# Patient Record
Sex: Female | Born: 1955 | Race: Black or African American | Hispanic: No | Marital: Single | Smoking: Current some day smoker
Health system: Southern US, Community
[De-identification: ages and names within clinical notes are randomized; demographics above are authoritative.]

## PROBLEM LIST (undated history)

## (undated) DIAGNOSIS — I517 Cardiomegaly: Secondary | ICD-10-CM

## (undated) DIAGNOSIS — K219 Gastro-esophageal reflux disease without esophagitis: Secondary | ICD-10-CM

## (undated) DIAGNOSIS — E119 Type 2 diabetes mellitus without complications: Secondary | ICD-10-CM

## (undated) DIAGNOSIS — I2510 Atherosclerotic heart disease of native coronary artery without angina pectoris: Secondary | ICD-10-CM

## (undated) DIAGNOSIS — I639 Cerebral infarction, unspecified: Secondary | ICD-10-CM

## (undated) DIAGNOSIS — G43909 Migraine, unspecified, not intractable, without status migrainosus: Secondary | ICD-10-CM

## (undated) DIAGNOSIS — I209 Angina pectoris, unspecified: Secondary | ICD-10-CM

## (undated) DIAGNOSIS — D649 Anemia, unspecified: Secondary | ICD-10-CM

## (undated) DIAGNOSIS — E7800 Pure hypercholesterolemia, unspecified: Secondary | ICD-10-CM

## (undated) DIAGNOSIS — K5900 Constipation, unspecified: Secondary | ICD-10-CM

## (undated) DIAGNOSIS — M1990 Unspecified osteoarthritis, unspecified site: Secondary | ICD-10-CM

## (undated) DIAGNOSIS — I10 Essential (primary) hypertension: Secondary | ICD-10-CM

## (undated) DIAGNOSIS — T4145XA Adverse effect of unspecified anesthetic, initial encounter: Secondary | ICD-10-CM

## (undated) DIAGNOSIS — T8859XA Other complications of anesthesia, initial encounter: Secondary | ICD-10-CM

## (undated) HISTORY — DX: Atherosclerotic heart disease of native coronary artery without angina pectoris: I25.10

## (undated) HISTORY — DX: Cerebral infarction, unspecified: I63.9

## (undated) HISTORY — PX: ABDOMINAL HYSTERECTOMY: SHX81

## (undated) HISTORY — PX: CORONARY ANGIOPLASTY WITH STENT PLACEMENT: SHX49

## (undated) HISTORY — PX: FRACTURE SURGERY: SHX138

## (undated) HISTORY — PX: MULTIPLE TOOTH EXTRACTIONS: SHX2053

---

## 1997-10-06 ENCOUNTER — Encounter: Admission: RE | Admit: 1997-10-06 | Discharge: 1997-10-06 | Payer: Self-pay | Admitting: *Deleted

## 1997-11-02 ENCOUNTER — Emergency Department (HOSPITAL_COMMUNITY): Admission: EM | Admit: 1997-11-02 | Discharge: 1997-11-02 | Payer: Self-pay

## 1997-11-02 ENCOUNTER — Inpatient Hospital Stay (HOSPITAL_COMMUNITY): Admission: AD | Admit: 1997-11-02 | Discharge: 1997-11-03 | Payer: Self-pay | Admitting: Internal Medicine

## 1997-11-03 ENCOUNTER — Encounter: Payer: Self-pay | Admitting: Internal Medicine

## 1997-11-03 ENCOUNTER — Emergency Department (HOSPITAL_COMMUNITY): Admission: EM | Admit: 1997-11-03 | Discharge: 1997-11-03 | Payer: Self-pay | Admitting: Emergency Medicine

## 1997-11-22 ENCOUNTER — Emergency Department (HOSPITAL_COMMUNITY): Admission: EM | Admit: 1997-11-22 | Discharge: 1997-11-22 | Payer: Self-pay | Admitting: Emergency Medicine

## 1997-11-22 ENCOUNTER — Ambulatory Visit (HOSPITAL_COMMUNITY): Admission: RE | Admit: 1997-11-22 | Discharge: 1997-11-22 | Payer: Self-pay | Admitting: Family Medicine

## 1997-11-22 ENCOUNTER — Encounter: Payer: Self-pay | Admitting: Family Medicine

## 1998-09-09 ENCOUNTER — Other Ambulatory Visit: Admission: RE | Admit: 1998-09-09 | Discharge: 1998-09-09 | Payer: Self-pay | Admitting: Obstetrics and Gynecology

## 1998-09-20 ENCOUNTER — Other Ambulatory Visit: Admission: RE | Admit: 1998-09-20 | Discharge: 1998-09-20 | Payer: Self-pay | Admitting: *Deleted

## 1998-09-20 ENCOUNTER — Encounter (INDEPENDENT_AMBULATORY_CARE_PROVIDER_SITE_OTHER): Payer: Self-pay

## 1998-10-27 ENCOUNTER — Encounter: Payer: Self-pay | Admitting: *Deleted

## 1998-10-27 ENCOUNTER — Ambulatory Visit (HOSPITAL_COMMUNITY): Admission: RE | Admit: 1998-10-27 | Discharge: 1998-10-27 | Payer: Self-pay | Admitting: *Deleted

## 1998-11-04 ENCOUNTER — Encounter: Payer: Self-pay | Admitting: *Deleted

## 1998-11-04 ENCOUNTER — Ambulatory Visit (HOSPITAL_COMMUNITY): Admission: RE | Admit: 1998-11-04 | Discharge: 1998-11-04 | Payer: Self-pay | Admitting: *Deleted

## 1999-01-26 ENCOUNTER — Ambulatory Visit (HOSPITAL_COMMUNITY): Admission: RE | Admit: 1999-01-26 | Discharge: 1999-01-26 | Payer: Self-pay | Admitting: Obstetrics and Gynecology

## 1999-01-26 ENCOUNTER — Encounter: Payer: Self-pay | Admitting: Obstetrics and Gynecology

## 1999-05-23 ENCOUNTER — Ambulatory Visit: Admission: RE | Admit: 1999-05-23 | Discharge: 1999-05-23 | Payer: Self-pay | Admitting: Gynecology

## 1999-05-25 ENCOUNTER — Encounter: Payer: Self-pay | Admitting: Obstetrics and Gynecology

## 1999-05-25 ENCOUNTER — Ambulatory Visit (HOSPITAL_COMMUNITY): Admission: RE | Admit: 1999-05-25 | Discharge: 1999-05-25 | Payer: Self-pay | Admitting: Obstetrics and Gynecology

## 2001-04-21 ENCOUNTER — Other Ambulatory Visit: Admission: RE | Admit: 2001-04-21 | Discharge: 2001-04-21 | Payer: Self-pay | Admitting: Internal Medicine

## 2001-04-30 ENCOUNTER — Encounter: Payer: Self-pay | Admitting: Internal Medicine

## 2001-04-30 ENCOUNTER — Encounter: Admission: RE | Admit: 2001-04-30 | Discharge: 2001-04-30 | Payer: Self-pay | Admitting: Internal Medicine

## 2001-05-01 ENCOUNTER — Ambulatory Visit (HOSPITAL_COMMUNITY): Admission: RE | Admit: 2001-05-01 | Discharge: 2001-05-01 | Payer: Self-pay | Admitting: Internal Medicine

## 2001-05-01 ENCOUNTER — Encounter: Payer: Self-pay | Admitting: Internal Medicine

## 2001-05-05 ENCOUNTER — Encounter: Admission: RE | Admit: 2001-05-05 | Discharge: 2001-05-05 | Payer: Self-pay | Admitting: Internal Medicine

## 2001-05-05 ENCOUNTER — Encounter: Payer: Self-pay | Admitting: Internal Medicine

## 2001-08-28 ENCOUNTER — Emergency Department (HOSPITAL_COMMUNITY): Admission: EM | Admit: 2001-08-28 | Discharge: 2001-08-29 | Payer: Self-pay | Admitting: Emergency Medicine

## 2001-09-02 ENCOUNTER — Emergency Department (HOSPITAL_COMMUNITY): Admission: EM | Admit: 2001-09-02 | Discharge: 2001-09-02 | Payer: Self-pay | Admitting: Emergency Medicine

## 2003-03-13 HISTORY — PX: CARDIAC CATHETERIZATION: SHX172

## 2003-03-29 ENCOUNTER — Emergency Department (HOSPITAL_COMMUNITY): Admission: EM | Admit: 2003-03-29 | Discharge: 2003-03-29 | Payer: Self-pay | Admitting: Emergency Medicine

## 2003-05-07 ENCOUNTER — Ambulatory Visit (HOSPITAL_COMMUNITY): Admission: RE | Admit: 2003-05-07 | Discharge: 2003-05-07 | Payer: Self-pay | Admitting: Internal Medicine

## 2003-06-27 ENCOUNTER — Emergency Department (HOSPITAL_COMMUNITY): Admission: EM | Admit: 2003-06-27 | Discharge: 2003-06-28 | Payer: Self-pay | Admitting: Emergency Medicine

## 2003-08-05 ENCOUNTER — Ambulatory Visit (HOSPITAL_COMMUNITY): Admission: RE | Admit: 2003-08-05 | Discharge: 2003-08-05 | Payer: Self-pay | Admitting: Cardiology

## 2004-01-17 ENCOUNTER — Ambulatory Visit: Payer: Self-pay | Admitting: Family Medicine

## 2004-01-18 ENCOUNTER — Ambulatory Visit: Payer: Self-pay | Admitting: Internal Medicine

## 2004-01-18 ENCOUNTER — Ambulatory Visit: Payer: Self-pay | Admitting: *Deleted

## 2004-02-15 ENCOUNTER — Ambulatory Visit: Payer: Self-pay | Admitting: Internal Medicine

## 2004-03-09 ENCOUNTER — Ambulatory Visit: Payer: Self-pay | Admitting: Family Medicine

## 2004-04-06 ENCOUNTER — Ambulatory Visit: Payer: Self-pay | Admitting: Family Medicine

## 2004-04-12 ENCOUNTER — Ambulatory Visit: Payer: Self-pay | Admitting: Internal Medicine

## 2004-04-18 ENCOUNTER — Ambulatory Visit: Payer: Self-pay | Admitting: Internal Medicine

## 2004-04-26 ENCOUNTER — Ambulatory Visit: Payer: Self-pay | Admitting: Internal Medicine

## 2004-05-08 ENCOUNTER — Ambulatory Visit (HOSPITAL_COMMUNITY): Admission: RE | Admit: 2004-05-08 | Discharge: 2004-05-08 | Payer: Self-pay | Admitting: Family Medicine

## 2004-06-26 ENCOUNTER — Ambulatory Visit: Payer: Self-pay | Admitting: Internal Medicine

## 2004-10-31 ENCOUNTER — Ambulatory Visit: Payer: Self-pay | Admitting: Family Medicine

## 2005-02-06 ENCOUNTER — Ambulatory Visit: Payer: Self-pay | Admitting: Internal Medicine

## 2005-02-22 ENCOUNTER — Ambulatory Visit: Payer: Self-pay | Admitting: Internal Medicine

## 2005-03-02 ENCOUNTER — Ambulatory Visit: Payer: Self-pay | Admitting: Internal Medicine

## 2005-03-23 ENCOUNTER — Ambulatory Visit: Payer: Self-pay | Admitting: Internal Medicine

## 2005-06-13 ENCOUNTER — Ambulatory Visit: Payer: Self-pay | Admitting: Family Medicine

## 2005-06-15 ENCOUNTER — Ambulatory Visit: Payer: Self-pay | Admitting: Family Medicine

## 2005-06-28 ENCOUNTER — Ambulatory Visit (HOSPITAL_COMMUNITY): Admission: RE | Admit: 2005-06-28 | Discharge: 2005-06-28 | Payer: Self-pay | Admitting: Internal Medicine

## 2005-07-11 ENCOUNTER — Encounter: Payer: Self-pay | Admitting: Internal Medicine

## 2005-08-14 ENCOUNTER — Ambulatory Visit: Payer: Self-pay | Admitting: Family Medicine

## 2005-11-01 ENCOUNTER — Ambulatory Visit: Payer: Self-pay | Admitting: Internal Medicine

## 2005-11-01 ENCOUNTER — Encounter (INDEPENDENT_AMBULATORY_CARE_PROVIDER_SITE_OTHER): Payer: Self-pay | Admitting: Internal Medicine

## 2006-04-08 ENCOUNTER — Ambulatory Visit: Payer: Self-pay | Admitting: Family Medicine

## 2006-04-08 ENCOUNTER — Encounter (INDEPENDENT_AMBULATORY_CARE_PROVIDER_SITE_OTHER): Payer: Self-pay | Admitting: Internal Medicine

## 2006-04-08 LAB — CONVERTED CEMR LAB
Cholesterol: 221 mg/dL
LDL Cholesterol: 137 mg/dL

## 2006-06-17 ENCOUNTER — Ambulatory Visit: Payer: Self-pay | Admitting: Internal Medicine

## 2006-06-19 ENCOUNTER — Ambulatory Visit: Payer: Self-pay | Admitting: Internal Medicine

## 2006-07-02 ENCOUNTER — Ambulatory Visit (HOSPITAL_COMMUNITY): Admission: RE | Admit: 2006-07-02 | Discharge: 2006-07-02 | Payer: Self-pay | Admitting: Internal Medicine

## 2006-07-11 ENCOUNTER — Ambulatory Visit: Payer: Self-pay | Admitting: Internal Medicine

## 2006-07-22 ENCOUNTER — Encounter (INDEPENDENT_AMBULATORY_CARE_PROVIDER_SITE_OTHER): Payer: Self-pay | Admitting: Internal Medicine

## 2006-07-22 ENCOUNTER — Ambulatory Visit: Payer: Self-pay | Admitting: Internal Medicine

## 2006-07-22 LAB — CONVERTED CEMR LAB: Hgb A1c MFr Bld: 8.1 %

## 2006-07-23 ENCOUNTER — Ambulatory Visit (HOSPITAL_COMMUNITY): Admission: RE | Admit: 2006-07-23 | Discharge: 2006-07-23 | Payer: Self-pay | Admitting: Internal Medicine

## 2006-11-27 ENCOUNTER — Encounter (INDEPENDENT_AMBULATORY_CARE_PROVIDER_SITE_OTHER): Payer: Self-pay | Admitting: *Deleted

## 2006-12-10 ENCOUNTER — Encounter (INDEPENDENT_AMBULATORY_CARE_PROVIDER_SITE_OTHER): Payer: Self-pay | Admitting: Internal Medicine

## 2006-12-10 DIAGNOSIS — M25559 Pain in unspecified hip: Secondary | ICD-10-CM

## 2006-12-10 DIAGNOSIS — J3089 Other allergic rhinitis: Secondary | ICD-10-CM

## 2006-12-10 DIAGNOSIS — E785 Hyperlipidemia, unspecified: Secondary | ICD-10-CM

## 2006-12-10 DIAGNOSIS — E118 Type 2 diabetes mellitus with unspecified complications: Secondary | ICD-10-CM

## 2006-12-10 DIAGNOSIS — I10 Essential (primary) hypertension: Secondary | ICD-10-CM | POA: Insufficient documentation

## 2006-12-10 DIAGNOSIS — IMO0002 Reserved for concepts with insufficient information to code with codable children: Secondary | ICD-10-CM | POA: Insufficient documentation

## 2006-12-10 DIAGNOSIS — E1165 Type 2 diabetes mellitus with hyperglycemia: Secondary | ICD-10-CM

## 2006-12-17 ENCOUNTER — Telehealth (INDEPENDENT_AMBULATORY_CARE_PROVIDER_SITE_OTHER): Payer: Self-pay | Admitting: Internal Medicine

## 2007-01-09 ENCOUNTER — Telehealth (INDEPENDENT_AMBULATORY_CARE_PROVIDER_SITE_OTHER): Payer: Self-pay | Admitting: Internal Medicine

## 2007-01-13 ENCOUNTER — Encounter (INDEPENDENT_AMBULATORY_CARE_PROVIDER_SITE_OTHER): Payer: Self-pay | Admitting: Internal Medicine

## 2007-01-19 ENCOUNTER — Telehealth (INDEPENDENT_AMBULATORY_CARE_PROVIDER_SITE_OTHER): Payer: Self-pay | Admitting: Internal Medicine

## 2007-01-19 DIAGNOSIS — K089 Disorder of teeth and supporting structures, unspecified: Secondary | ICD-10-CM | POA: Insufficient documentation

## 2007-01-23 ENCOUNTER — Encounter (INDEPENDENT_AMBULATORY_CARE_PROVIDER_SITE_OTHER): Payer: Self-pay | Admitting: Internal Medicine

## 2007-01-28 ENCOUNTER — Ambulatory Visit: Payer: Self-pay | Admitting: Internal Medicine

## 2007-01-28 DIAGNOSIS — D485 Neoplasm of uncertain behavior of skin: Secondary | ICD-10-CM

## 2007-01-28 DIAGNOSIS — M6740 Ganglion, unspecified site: Secondary | ICD-10-CM

## 2007-01-28 LAB — CONVERTED CEMR LAB: Blood Glucose, Fingerstick: 126

## 2007-01-29 ENCOUNTER — Encounter (INDEPENDENT_AMBULATORY_CARE_PROVIDER_SITE_OTHER): Payer: Self-pay | Admitting: Internal Medicine

## 2007-02-11 ENCOUNTER — Ambulatory Visit: Payer: Self-pay | Admitting: Internal Medicine

## 2007-02-25 ENCOUNTER — Ambulatory Visit: Payer: Self-pay | Admitting: Internal Medicine

## 2007-03-05 ENCOUNTER — Ambulatory Visit: Payer: Self-pay | Admitting: Family Medicine

## 2007-03-05 DIAGNOSIS — J069 Acute upper respiratory infection, unspecified: Secondary | ICD-10-CM | POA: Insufficient documentation

## 2007-03-05 LAB — CONVERTED CEMR LAB: Blood Glucose, Fingerstick: 202

## 2007-03-26 LAB — CONVERTED CEMR LAB
AST: 21 units/L (ref 0–37)
Albumin: 4.5 g/dL (ref 3.5–5.2)
BUN: 15 mg/dL (ref 6–23)
Cholesterol: 168 mg/dL (ref 0–200)
Creatinine, Ser: 0.78 mg/dL (ref 0.40–1.20)
HDL: 34 mg/dL — ABNORMAL LOW (ref >39)
Total CHOL/HDL Ratio: 4.9
Total Protein: 7.9 g/dL (ref 6.0–8.3)
Triglycerides: 228 mg/dL — ABNORMAL HIGH (ref <150)
VLDL: 46 mg/dL — ABNORMAL HIGH (ref 0–40)

## 2007-05-06 ENCOUNTER — Ambulatory Visit: Payer: Self-pay | Admitting: Internal Medicine

## 2007-05-06 LAB — CONVERTED CEMR LAB: Blood Glucose, Fingerstick: 125

## 2007-05-13 ENCOUNTER — Encounter (INDEPENDENT_AMBULATORY_CARE_PROVIDER_SITE_OTHER): Payer: Self-pay | Admitting: Internal Medicine

## 2007-06-17 ENCOUNTER — Ambulatory Visit: Payer: Self-pay | Admitting: Internal Medicine

## 2007-06-17 LAB — CONVERTED CEMR LAB
AST: 36 units/L (ref 0–37)
Albumin: 4.5 g/dL (ref 3.5–5.2)
Cholesterol: 252 mg/dL — ABNORMAL HIGH (ref 0–200)
HDL: 39 mg/dL — ABNORMAL LOW (ref >39)
Total Protein: 7.8 g/dL (ref 6.0–8.3)
Triglycerides: 479 mg/dL — ABNORMAL HIGH (ref <150)

## 2007-06-19 ENCOUNTER — Ambulatory Visit: Payer: Self-pay | Admitting: Internal Medicine

## 2007-08-19 ENCOUNTER — Ambulatory Visit (HOSPITAL_COMMUNITY): Admission: RE | Admit: 2007-08-19 | Discharge: 2007-08-19 | Payer: Self-pay | Admitting: Internal Medicine

## 2007-08-19 ENCOUNTER — Ambulatory Visit: Payer: Self-pay | Admitting: Internal Medicine

## 2007-08-19 DIAGNOSIS — M51379 Other intervertebral disc degeneration, lumbosacral region without mention of lumbar back pain or lower extremity pain: Secondary | ICD-10-CM | POA: Insufficient documentation

## 2007-08-19 DIAGNOSIS — F17210 Nicotine dependence, cigarettes, uncomplicated: Secondary | ICD-10-CM | POA: Insufficient documentation

## 2007-08-19 DIAGNOSIS — M545 Low back pain: Secondary | ICD-10-CM

## 2007-08-19 DIAGNOSIS — M5137 Other intervertebral disc degeneration, lumbosacral region: Secondary | ICD-10-CM | POA: Insufficient documentation

## 2007-08-19 LAB — CONVERTED CEMR LAB
ALT: 38 units/L — ABNORMAL HIGH (ref 0–35)
Alkaline Phosphatase: 85 units/L (ref 39–117)
Blood Glucose, Fingerstick: 152
CO2: 26 meq/L (ref 19–32)
Calcium: 10 mg/dL (ref 8.4–10.5)
Chloride: 101 meq/L (ref 96–112)
Eosinophils Absolute: 0.2 10*3/uL (ref 0.0–0.7)
Glucose, Bld: 97 mg/dL (ref 70–99)
Hemoglobin: 12.5 g/dL (ref 12.0–15.0)
Hgb A1c MFr Bld: 8.5 %
Ketones, urine, test strip: NEGATIVE
LDL Cholesterol: 86 mg/dL (ref 0–99)
Lymphocytes Relative: 29 % (ref 12–46)
MCHC: 30.8 g/dL (ref 30.0–36.0)
MCV: 91 fL (ref 78.0–100.0)
Microalb, Ur: 1.09 mg/dL (ref 0.00–1.89)
Monocytes Relative: 8 % (ref 3–12)
Neutro Abs: 4.2 10*3/uL (ref 1.7–7.7)
Nitrite: NEGATIVE
Platelets: 402 10*3/uL — ABNORMAL HIGH (ref 150–400)
Potassium: 4 meq/L (ref 3.5–5.3)
RDW: 14.4 % (ref 11.5–15.5)
Total Bilirubin: 0.4 mg/dL (ref 0.3–1.2)
Triglycerides: 282 mg/dL — ABNORMAL HIGH (ref <150)
VLDL: 56 mg/dL — ABNORMAL HIGH (ref 0–40)
WBC: 7 10*3/uL (ref 4.0–10.5)
pH: 5

## 2007-08-26 ENCOUNTER — Ambulatory Visit: Payer: Self-pay | Admitting: Internal Medicine

## 2007-08-26 ENCOUNTER — Ambulatory Visit (HOSPITAL_COMMUNITY): Admission: RE | Admit: 2007-08-26 | Discharge: 2007-08-26 | Payer: Self-pay | Admitting: Internal Medicine

## 2007-09-23 LAB — CONVERTED CEMR LAB
Rhuematoid fact SerPl-aCnc: <20 intl units/mL (ref 0–20)
Sed Rate: 22 mm/hr (ref 0–22)

## 2007-12-18 ENCOUNTER — Ambulatory Visit: Payer: Self-pay | Admitting: Nurse Practitioner

## 2007-12-18 DIAGNOSIS — J42 Unspecified chronic bronchitis: Secondary | ICD-10-CM | POA: Insufficient documentation

## 2007-12-18 LAB — CONVERTED CEMR LAB: Blood Glucose, Fingerstick: 330

## 2008-01-19 ENCOUNTER — Emergency Department (HOSPITAL_COMMUNITY): Admission: EM | Admit: 2008-01-19 | Discharge: 2008-01-19 | Payer: Self-pay | Admitting: Family Medicine

## 2008-02-17 ENCOUNTER — Encounter (INDEPENDENT_AMBULATORY_CARE_PROVIDER_SITE_OTHER): Payer: Self-pay | Admitting: Internal Medicine

## 2008-02-17 ENCOUNTER — Ambulatory Visit: Payer: Self-pay | Admitting: Internal Medicine

## 2008-03-24 ENCOUNTER — Ambulatory Visit: Payer: Self-pay | Admitting: Nurse Practitioner

## 2008-03-24 LAB — CONVERTED CEMR LAB
Blood Glucose, Fingerstick: 142
Hgb A1c MFr Bld: 8 %
KOH Prep: NEGATIVE
Specific Gravity, Urine: >=1.03
WBC Urine, dipstick: NEGATIVE
pH: 5

## 2008-04-22 ENCOUNTER — Ambulatory Visit: Payer: Self-pay | Admitting: Internal Medicine

## 2008-04-29 ENCOUNTER — Ambulatory Visit: Payer: Self-pay | Admitting: Internal Medicine

## 2008-04-29 ENCOUNTER — Encounter (INDEPENDENT_AMBULATORY_CARE_PROVIDER_SITE_OTHER): Payer: Self-pay | Admitting: Internal Medicine

## 2008-05-05 ENCOUNTER — Encounter (INDEPENDENT_AMBULATORY_CARE_PROVIDER_SITE_OTHER): Payer: Self-pay | Admitting: Internal Medicine

## 2008-05-05 ENCOUNTER — Encounter: Admission: RE | Admit: 2008-05-05 | Discharge: 2008-06-02 | Payer: Self-pay | Admitting: Internal Medicine

## 2008-05-15 DIAGNOSIS — Z780 Asymptomatic menopausal state: Secondary | ICD-10-CM | POA: Insufficient documentation

## 2008-05-18 ENCOUNTER — Encounter (INDEPENDENT_AMBULATORY_CARE_PROVIDER_SITE_OTHER): Payer: Self-pay | Admitting: Internal Medicine

## 2008-05-20 ENCOUNTER — Ambulatory Visit (HOSPITAL_COMMUNITY): Admission: RE | Admit: 2008-05-20 | Discharge: 2008-05-20 | Payer: Self-pay | Admitting: Internal Medicine

## 2008-05-20 ENCOUNTER — Encounter (INDEPENDENT_AMBULATORY_CARE_PROVIDER_SITE_OTHER): Payer: Self-pay | Admitting: Internal Medicine

## 2008-05-31 ENCOUNTER — Emergency Department (HOSPITAL_COMMUNITY): Admission: EM | Admit: 2008-05-31 | Discharge: 2008-05-31 | Payer: Self-pay | Admitting: Emergency Medicine

## 2008-06-01 ENCOUNTER — Ambulatory Visit: Payer: Self-pay | Admitting: Internal Medicine

## 2008-06-01 DIAGNOSIS — K5900 Constipation, unspecified: Secondary | ICD-10-CM | POA: Insufficient documentation

## 2008-06-14 ENCOUNTER — Encounter (INDEPENDENT_AMBULATORY_CARE_PROVIDER_SITE_OTHER): Payer: Self-pay | Admitting: Internal Medicine

## 2008-08-11 ENCOUNTER — Ambulatory Visit: Payer: Self-pay | Admitting: Internal Medicine

## 2008-08-11 LAB — CONVERTED CEMR LAB
Albumin: 4.3 g/dL (ref 3.5–5.2)
Cholesterol: 152 mg/dL (ref 0–200)
Glucose, Bld: 130 mg/dL — ABNORMAL HIGH (ref 70–99)
HDL: 32 mg/dL — ABNORMAL LOW (ref >39)
LDL Cholesterol: 87 mg/dL (ref 0–99)
Potassium: 4.7 meq/L (ref 3.5–5.3)
Sodium: 145 meq/L (ref 135–145)
Total Protein: 7.7 g/dL (ref 6.0–8.3)
Triglycerides: 165 mg/dL — ABNORMAL HIGH (ref <150)

## 2008-08-12 ENCOUNTER — Encounter (INDEPENDENT_AMBULATORY_CARE_PROVIDER_SITE_OTHER): Payer: Self-pay | Admitting: Internal Medicine

## 2008-08-13 ENCOUNTER — Encounter (INDEPENDENT_AMBULATORY_CARE_PROVIDER_SITE_OTHER): Payer: Self-pay | Admitting: Internal Medicine

## 2008-08-30 ENCOUNTER — Encounter: Admission: RE | Admit: 2008-08-30 | Discharge: 2008-08-30 | Payer: Self-pay | Admitting: Internal Medicine

## 2008-09-02 ENCOUNTER — Ambulatory Visit: Payer: Self-pay | Admitting: Internal Medicine

## 2008-09-02 LAB — CONVERTED CEMR LAB: Blood Glucose, Fingerstick: 231

## 2008-09-07 ENCOUNTER — Encounter (INDEPENDENT_AMBULATORY_CARE_PROVIDER_SITE_OTHER): Payer: Self-pay | Admitting: Internal Medicine

## 2008-09-28 ENCOUNTER — Ambulatory Visit: Payer: Self-pay | Admitting: Internal Medicine

## 2008-12-23 ENCOUNTER — Ambulatory Visit: Payer: Self-pay | Admitting: Internal Medicine

## 2008-12-23 LAB — CONVERTED CEMR LAB
ALT: 37 U/L — ABNORMAL HIGH
AST: 23 U/L
Albumin: 4.3 g/dL
Alkaline Phosphatase: 88 U/L
BUN: 17 mg/dL
Basophils Absolute: 0 K/uL
Basophils Relative: 0 %
CO2: 25 meq/L
Calcium: 10.1 mg/dL
Chloride: 102 meq/L
Cholesterol: 153 mg/dL
Creatinine, Ser: 0.69 mg/dL
Eosinophils Absolute: 0.2 K/uL
Eosinophils Relative: 3 %
Glucose, Bld: 147 mg/dL — ABNORMAL HIGH
HCT: 37.4 %
HDL: 33 mg/dL — ABNORMAL LOW
Hemoglobin: 11.5 g/dL — ABNORMAL LOW
LDL Cholesterol: 80 mg/dL
Lymphocytes Relative: 26 %
Lymphs Abs: 2.1 K/uL
MCHC: 30.7 g/dL
MCV: 90.3 fL
Monocytes Absolute: 0.8 K/uL
Monocytes Relative: 10 %
Neutro Abs: 5 K/uL
Neutrophils Relative %: 61 %
Platelets: 380 K/uL
Potassium: 4.4 meq/L
RBC: 4.14 M/uL
RDW: 15.5 %
Sodium: 141 meq/L
Total Bilirubin: 0.2 mg/dL — ABNORMAL LOW
Total CHOL/HDL Ratio: 4.6
Total Protein: 7.6 g/dL
Triglycerides: 198 mg/dL — ABNORMAL HIGH
VLDL: 40 mg/dL
WBC: 8.2 10*3/microliter

## 2008-12-25 ENCOUNTER — Emergency Department (HOSPITAL_COMMUNITY): Admission: EM | Admit: 2008-12-25 | Discharge: 2008-12-25 | Payer: Self-pay | Admitting: Family Medicine

## 2008-12-28 ENCOUNTER — Ambulatory Visit: Payer: Self-pay | Admitting: Internal Medicine

## 2008-12-28 DIAGNOSIS — D649 Anemia, unspecified: Secondary | ICD-10-CM

## 2008-12-28 DIAGNOSIS — R011 Cardiac murmur, unspecified: Secondary | ICD-10-CM | POA: Insufficient documentation

## 2008-12-28 DIAGNOSIS — R748 Abnormal levels of other serum enzymes: Secondary | ICD-10-CM | POA: Insufficient documentation

## 2008-12-28 DIAGNOSIS — S139XXA Sprain of joints and ligaments of unspecified parts of neck, initial encounter: Secondary | ICD-10-CM

## 2008-12-28 LAB — CONVERTED CEMR LAB: Blood Glucose, Fingerstick: 138

## 2008-12-29 ENCOUNTER — Encounter (INDEPENDENT_AMBULATORY_CARE_PROVIDER_SITE_OTHER): Payer: Self-pay | Admitting: Internal Medicine

## 2008-12-29 LAB — CONVERTED CEMR LAB
HCV Ab: NEGATIVE
Hep B S Ab: NEGATIVE
Iron: 52 ug/dL (ref 42–145)
Saturation Ratios: 12 % — ABNORMAL LOW (ref 20–55)
TIBC: 450 ug/dL (ref 250–470)
UIBC: 398 ug/dL

## 2009-01-10 ENCOUNTER — Telehealth (INDEPENDENT_AMBULATORY_CARE_PROVIDER_SITE_OTHER): Payer: Self-pay | Admitting: Internal Medicine

## 2009-01-26 ENCOUNTER — Telehealth (INDEPENDENT_AMBULATORY_CARE_PROVIDER_SITE_OTHER): Payer: Self-pay | Admitting: Internal Medicine

## 2009-02-01 ENCOUNTER — Ambulatory Visit: Admission: RE | Admit: 2009-02-01 | Discharge: 2009-02-01 | Payer: Self-pay | Admitting: Internal Medicine

## 2009-02-01 ENCOUNTER — Encounter (INDEPENDENT_AMBULATORY_CARE_PROVIDER_SITE_OTHER): Payer: Self-pay | Admitting: Internal Medicine

## 2009-02-16 ENCOUNTER — Encounter (INDEPENDENT_AMBULATORY_CARE_PROVIDER_SITE_OTHER): Payer: Self-pay | Admitting: *Deleted

## 2009-03-21 ENCOUNTER — Encounter (INDEPENDENT_AMBULATORY_CARE_PROVIDER_SITE_OTHER): Payer: Self-pay | Admitting: Internal Medicine

## 2009-03-21 DIAGNOSIS — I517 Cardiomegaly: Secondary | ICD-10-CM

## 2009-03-28 ENCOUNTER — Telehealth (INDEPENDENT_AMBULATORY_CARE_PROVIDER_SITE_OTHER): Payer: Self-pay | Admitting: Internal Medicine

## 2009-03-29 ENCOUNTER — Telehealth (INDEPENDENT_AMBULATORY_CARE_PROVIDER_SITE_OTHER): Payer: Self-pay | Admitting: Internal Medicine

## 2009-04-08 ENCOUNTER — Emergency Department (HOSPITAL_COMMUNITY): Admission: EM | Admit: 2009-04-08 | Discharge: 2009-04-08 | Payer: Self-pay | Admitting: Family Medicine

## 2009-08-15 ENCOUNTER — Emergency Department (HOSPITAL_COMMUNITY): Admission: EM | Admit: 2009-08-15 | Discharge: 2009-08-15 | Payer: Self-pay | Admitting: Family Medicine

## 2009-10-13 ENCOUNTER — Emergency Department (HOSPITAL_COMMUNITY): Admission: EM | Admit: 2009-10-13 | Discharge: 2009-10-13 | Payer: Self-pay | Admitting: Emergency Medicine

## 2009-10-20 ENCOUNTER — Ambulatory Visit (HOSPITAL_COMMUNITY): Admission: RE | Admit: 2009-10-20 | Discharge: 2009-10-20 | Payer: Self-pay | Admitting: Internal Medicine

## 2009-10-25 ENCOUNTER — Emergency Department (HOSPITAL_COMMUNITY): Admission: EM | Admit: 2009-10-25 | Discharge: 2009-10-25 | Payer: Self-pay | Admitting: Family Medicine

## 2009-11-29 ENCOUNTER — Ambulatory Visit: Payer: Self-pay | Admitting: Internal Medicine

## 2009-11-29 DIAGNOSIS — M79609 Pain in unspecified limb: Secondary | ICD-10-CM | POA: Insufficient documentation

## 2009-11-29 DIAGNOSIS — R05 Cough: Secondary | ICD-10-CM | POA: Insufficient documentation

## 2009-12-05 ENCOUNTER — Telehealth (INDEPENDENT_AMBULATORY_CARE_PROVIDER_SITE_OTHER): Payer: Self-pay | Admitting: Internal Medicine

## 2009-12-16 ENCOUNTER — Telehealth (INDEPENDENT_AMBULATORY_CARE_PROVIDER_SITE_OTHER): Payer: Self-pay | Admitting: Internal Medicine

## 2009-12-29 ENCOUNTER — Ambulatory Visit: Payer: Self-pay | Admitting: Internal Medicine

## 2010-02-28 ENCOUNTER — Encounter (INDEPENDENT_AMBULATORY_CARE_PROVIDER_SITE_OTHER): Payer: Self-pay | Admitting: Internal Medicine

## 2010-02-28 ENCOUNTER — Ambulatory Visit: Payer: Self-pay | Admitting: Internal Medicine

## 2010-02-28 DIAGNOSIS — R51 Headache: Secondary | ICD-10-CM

## 2010-02-28 DIAGNOSIS — R519 Headache, unspecified: Secondary | ICD-10-CM | POA: Insufficient documentation

## 2010-02-28 DIAGNOSIS — G4700 Insomnia, unspecified: Secondary | ICD-10-CM

## 2010-02-28 LAB — CONVERTED CEMR LAB
Albumin: 4.4 g/dL (ref 3.5–5.2)
BUN: 13 mg/dL (ref 6–23)
Blood Glucose, Fingerstick: 125
CO2: 27 meq/L (ref 19–32)
Calcium: 9.7 mg/dL (ref 8.4–10.5)
Chloride: 100 meq/L (ref 96–112)
Creatinine, Ser: 0.67 mg/dL (ref 0.40–1.20)
Glucose, Bld: 131 mg/dL — ABNORMAL HIGH (ref 70–99)

## 2010-03-09 ENCOUNTER — Encounter (INDEPENDENT_AMBULATORY_CARE_PROVIDER_SITE_OTHER): Payer: Self-pay | Admitting: Internal Medicine

## 2010-04-09 ENCOUNTER — Emergency Department (HOSPITAL_COMMUNITY)
Admission: EM | Admit: 2010-04-09 | Discharge: 2010-04-09 | Payer: Self-pay | Source: Home / Self Care | Admitting: Family Medicine

## 2010-04-11 NOTE — Assessment & Plan Note (Signed)
Summary: f/u on dm ,bp .lr   Vital Signs:  Patient profile:   55 year old female Menstrual status:  hysterectomy Height:      65 inches Weight:      190.3 pounds BMI:     31.78 Temp:     98.4 degrees F Pulse rate:   60 / minute Resp:     20 per minute BP sitting:   140 / 80  (left arm)  Vitals Entered By: Michelle Nasuti (November 29, 2009 10:03 AM) CC: dm folllow up Is Patient Diabetic? Yes CBG Result 140 CBG Device ID fast   Primary Care Provider:  Julieanne Manson MD  CC:  dm folllow up.  History of Present Illness: Pt. was to be seen sometime at the beginning of the year in follow up---cannot get a clear reason why she has not been here in about 1 year.  1.  Anemia:  Pt. does not recall that she was to see GI for colonoscopy secondary to anemia.  Discussed that [facility] even sent out a new pt. letter. Pt. states she never received.  States she generally does not have difficulties with memory.   2.  DM:  140-150s.  No definite polyuria or polydipsia.    3.  Cough:  congested--has had for 2 weeks.  Has Robitussin DM--helps for a couple of hours.  Coughing  up clear phlegm.  No fever.  Does have sinus pressure and pain.  +posterior pharyngeal drainage.  No sore throat.  +smoker--5-10 cigarettes daily.  Is not taking Wellbutrin to quit smoking--describes using as needed only.  Stopped Lisinopril some time ago as she went to ED and they told her it was causing another cough.  Cannot recall when.  4.  Hypercholesterolemia:  Has not been on Lovaza for 2 months.  Just stopped receiving.  5.  Pain On back of heel:  for one month--comes and goes.  Has not noted any association with shoes or a particular activity.  Allergies: 1)  Pcn 2)  Codeine 3)  Sulfa 4)  Erythromycin  Physical Exam  General:  NAD Head:  NT over sinuses. Eyes:  No corneal or conjunctival inflammation noted. EOMI. Perrla. Funduscopic exam benign, without hemorrhages, exudates or papilledema. Vision  grossly normal. Ears:  External ear exam shows no significant lesions or deformities.  Otoscopic examination reveals clear canals, tympanic membranes are intact bilaterally without bulging, retraction, inflammation or discharge. Hearing is grossly normal bilaterally. Nose:  some mucosal swelling and white discharge. Mouth:  pharynx pink and moist.  Unable to get a clear view of posterior pharynx Neck:  No deformities, masses, or tenderness noted. Lungs:  Normal respiratory effort, chest expands symmetrically. Lungs are clear to auscultation, no crackles or wheezes. Heart:  Normal rate and regular rhythm. S1 and S2 normal without gallop, murmur, click, rub or other extra sounds. Extremities:  No swelling or enlargement at insertion of Achilles tendon on posterior heel--right foot.  Currently no tenderness noted by pt on palpation of hindfoot.   Impression & Recommendations:  Problem # 1:  HEEL PAIN, RIGHT (ICD-729.5) No findings today--pt to check out shoes and see if a pair in particular is the cause.   Also, to pay attention to any activity that exacerbates. Went over stretches.  Problem # 2:  RHINITIS, ALLERGIC NEC (ICD-477.8) vs URI To get restarted on allergy meds. Call if worsens  Problem # 3:  HYPERLIPIDEMIA (ICD-272.4) Will refill Lovaza Get her set up for FLP/Cmet at next  visit after taking both regularly Her updated medication list for this problem includes:    Lipitor 40 Mg Tabs (Atorvastatin calcium) .Marland Kitchen... 1 tab by mouth daily    Lovaza 1 Gm Caps (Omega-3-acid ethyl esters) .Marland KitchenMarland KitchenMarland KitchenMarland Kitchen 4 caps by mouth daily  Problem # 4:  HYPERTENSION (ICD-401.9) Switch to Losartan for bp with Lisinopril removed for cough Her updated medication list for this problem includes:    Atenolol 50 Mg Tabs (Atenolol) .Marland Kitchen... 1/2 tab by mouth daily    Hydrochlorothiazide 25 Mg Tabs (Hydrochlorothiazide) .Marland Kitchen... 1 tab by mouth q am    Losartan Potassium 50 Mg Tabs (Losartan potassium) .Marland Kitchen... 1 tab by mouth  daily--use instead of lisinopril  Problem # 5:  ANEMIA (ICD-285.9) Pt. later recalls she did not go for colonoscopy as she did not have the money--states she will try to get some together for colonoscopy at this time. Her updated medication list for this problem includes:    Vitamin B-12 100 Mcg Tabs (Cyanocobalamin) .Marland Kitchen... 1 tab by mouth daily  Complete Medication List: 1)  Glucophage 1000 Mg Tabs (Metformin hcl) .Marland Kitchen.. 1 tab by mouth two times a day with meals 2)  Adult Aspirin Ec Low Strength 81 Mg Tbec (Aspirin) .Marland Kitchen.. 1 tab by mouth daily with meal 3)  Protonix 40 Mg Tbec (Pantoprazole sodium) .Marland Kitchen.. 1 tab by mouth daily 4)  Atenolol 50 Mg Tabs (Atenolol) .... 1/2 tab by mouth daily 5)  Isosorbide Mononitrate Cr 30 Mg Tb24 (Isosorbide mononitrate) .Marland Kitchen.. 1 tab by mouth daily 6)  Hydrochlorothiazide 25 Mg Tabs (Hydrochlorothiazide) .Marland Kitchen.. 1 tab by mouth q am 7)  Xyzal 5 Mg Tabs (Levocetirizine dihydrochloride) .Marland Kitchen.. 1 tab by mouth daily 8)  Nasacort Aq 55 Mcg/act Aers (Triamcinolone acetonide(nasal)) .... 2 sprays each nostril daily 9)  Lipitor 40 Mg Tabs (Atorvastatin calcium) .Marland Kitchen.. 1 tab by mouth daily 10)  Glucotrol 5 Mg Tabs (Glipizide) .Marland Kitchen.. 1 tab po two times a day 11)  Blood Glucose Meter Kit (Blood glucose monitoring suppl) .... For daily glucose checks. 12)  Glucometer Test Strp (glucose Blood)  .... Daily testing 13)  Lovaza 1 Gm Caps (Omega-3-acid ethyl esters) .... 4 caps by mouth daily 14)  Caltrate 600+d Plus 600-400 Mg-unit Chew (Calcium carbonate-vit d-min) .Marland Kitchen.. 1 tab by mouth two times a day 15)  Ibuprofen 600 Mg Tabs (Ibuprofen) .Marland Kitchen.. 1 tab by mouth with food q6h as needed pain 16)  Losartan Potassium 50 Mg Tabs (Losartan potassium) .Marland Kitchen.. 1 tab by mouth daily--use instead of lisinopril 17)  Vitamin B-12 100 Mcg Tabs (Cyanocobalamin) .Marland Kitchen.. 1 tab by mouth daily  Other Orders: Capillary Blood Glucose/CBG (82948) Hgb A1C (16109UE)  Patient Instructions: 1)  Follow up with Dr. Delrae Alfred  in 3 months --follow up of multiple health concerns. 2)  Nurse visit for bp check in 1 month--switch to Losartan Prescriptions: VITAMIN B-12 100 MCG TABS (CYANOCOBALAMIN) 1 tab by mouth daily  #30 x 11   Entered and Authorized by:   Julieanne Manson MD   Signed by:   Julieanne Manson MD on 11/29/2009   Method used:   Faxed to ...       [facility] (retail)       [address] [place], [place]  [address]       Ph: [phone]       Fax: [phone]   RxID:   [id number] IBUPROFEN 600 MG  TABS (IBUPROFEN) 1 tab by mouth with food  q6h as needed pain  #90 x 0   Entered and Authorized by:   Julieanne Manson MD   Signed by:   Julieanne Manson MD on 11/29/2009   Method used:   Faxed to ...       [facility] (retail)       [address] [place], [place]  [address]       Ph: [phone]       Fax: [phone]   RxID:   [id number] CALTRATE 600+D PLUS 600-400 MG-UNIT  CHEW (CALCIUM CARBONATE-VIT D-MIN) 1 tab by mouth two times a day  #60 x 11   Entered and Authorized by:   Julieanne Manson MD   Signed by:   Julieanne Manson MD on 11/29/2009   Method used:   Faxed to ...       [facility] - [facility] (retail)       [address] [place], [place]  [address]       Ph: [phone]       Fax: [phone]   RxID:   [id number] LOVAZA 1 GM  CAPS (OMEGA-3-ACID ETHYL ESTERS) 4 caps by mouth daily  #120 x 11   Entered and Authorized by:   Julieanne Manson MD   Signed by:   Julieanne Manson MD on 11/29/2009   Method used:   Faxed to ...       [facility] - [facility] (retail)       [address] [place], [place]  [address]       Ph: [phone]       Fax: [phone]   RxID:   [id number] GLUCOTROL 5 MG TABS (GLIPIZIDE) 1 tab po two times a day  #60 x 11   Entered and Authorized by:   Julieanne Manson MD   Signed by:   Julieanne Manson MD on 11/29/2009   Method used:   Faxed to ...       [facility] - [facility] (retail)       [address] [place], [place]  [address]       Ph: [phone]       Fax: [phone]   RxID:   [id number] LIPITOR 40 MG  TABS (ATORVASTATIN CALCIUM) 1 tab by mouth daily  #30 x 11   Entered and Authorized by:   Julieanne Manson MD   Signed by:   Julieanne Manson MD on 11/29/2009   Method used:   Faxed to ...       [facility] - [facility] (retail)       [address] [place], [place]  [address]       Ph: [phone]       Fax: [phone]   RxID:   [id number] NASACORT AQ 55 MCG/ACT  AERS (TRIAMCINOLONE ACETONIDE(NASAL)) 2 sprays each nostril daily  #1 x 11   Entered and Authorized by:   Julieanne Manson MD   Signed by:   Julieanne Manson MD on 11/29/2009   Method used:   Faxed to ...       [facility] - [facility] (retail)       [address] [place], [place]  [address]       Ph: [phone]  Fax: [phone]   RxID:   [id number] HYDROCHLOROTHIAZIDE 25 MG  TABS (HYDROCHLOROTHIAZIDE) 1 tab by mouth q am  #30 x 11   Entered and Authorized by:   Julieanne Manson MD   Signed by:   Julieanne Manson MD on 11/29/2009   Method used:   Faxed to ...       [facility] - [facility] (retail)       [address] [place], [place]  [address]       Ph: [phone]       Fax: [phone]   RxID:   [id number] ISOSORBIDE MONONITRATE CR 30 MG  TB24 (ISOSORBIDE MONONITRATE) 1 tab by mouth daily  #30 x 11   Entered and Authorized by:   Julieanne Manson MD   Signed by:   Julieanne Manson MD on 11/29/2009   Method used:   Faxed to ...       [facility] - [facility] (retail)       [address] [place], [place]  [address]       Ph: [phone]        Fax: [phone]   RxID:   [id number] ATENOLOL 50 MG  TABS (ATENOLOL) 1/2 tab by mouth daily  #15 x 11   Entered and Authorized by:   Julieanne Manson MD   Signed by:   Julieanne Manson MD on 11/29/2009   Method used:   Faxed to ...       [facility] - Pharmac (retail)       [address] [place], [place]  [address]       Ph: [phone]       Fax: [phone]   RxID:   [id number] PROTONIX 40 MG  TBEC (PANTOPRAZOLE SODIUM) 1 tab by mouth daily  #30 x 11   Entered and Authorized by:   Julieanne Manson MD   Signed by:   Julieanne Manson MD on 11/29/2009   Method used:   Faxed to ...       [facility] - [facility] (retail)       [address] [place], [place]  [address]       Ph: [phone]       Fax: [phone]   RxID:   [id number] ADULT ASPIRIN EC LOW STRENGTH 81 MG  TBEC (ASPIRIN) 1 tab by mouth daily with meal  #30 x 11   Entered and Authorized by:   Julieanne Manson MD   Signed by:   Julieanne Manson MD on 11/29/2009   Method used:   Faxed to ...       [facility] - Pharmac (retail)       [address] [place], [place]  [address]       Ph: [phone]       Fax: [phone]   RxID:   [id number] GLUCOPHAGE 1000 MG  TABS (METFORMIN HCL) 1 tab by mouth two times a day with meals  #60 x 11   Entered and Authorized by:   Julieanne Manson MD   Signed by:   Julieanne Manson MD on 11/29/2009   Method used:   Faxed to ...       [facility] - [facility] (retail)       [address]  [place], [place]  [address]       Ph: [phone]       Fax: [phone]   RxID:   [id number] LOSARTAN POTASSIUM 50 MG TABS (LOSARTAN POTASSIUM) 1 tab by mouth daily--use instead of Lisinopril  #30 x 11   Entered and Authorized by:   Julieanne Manson MD   Signed by:   Julieanne Manson MD on 11/29/2009    Method used:   Faxed to ...       [facility] - Pharmac (retail)       [address] [place], [place]  [address]       Ph: [phone]       Fax: [phone]   RxID:   [id number] XYZAL 5 MG TABS (LEVOCETIRIZINE DIHYDROCHLORIDE) 1 tab by mouth daily  #30 x 11   Entered and Authorized by:   Julieanne Manson MD   Signed by:   Julieanne Manson MD on 11/29/2009   Method used:   Faxed to ...       [facility] - [facility] (retail)       [address] [place], [place]  [address]       Ph: [phone]       Fax: [phone]   RxID:   [id number]   Laboratory Results   Blood Tests     HGBA1C: 7.8%   (Normal Range: Non-Diabetic - 3-6%   Control Diabetic - 6-8%) CBG Random:: 140

## 2010-04-11 NOTE — Assessment & Plan Note (Signed)
Summary: 1 MONTH FU FOR BP CHECK///KT  Nurse Visit   Vital Signs:  Patient profile:   55 year old female Menstrual status:  hysterectomy Pulse rate:   76 / minute Pulse rhythm:   regular Resp:     20 per minute BP sitting:   124 / 80  (left arm) Cuff size:   large  Vitals Entered By: Dutch Quint RN (December 29, 2009 9:43 AM)  Impression & Recommendations:  Problem # 1:  HYPERTENSION (ICD-401.9) Continue medications Still has cough, using allergy meds F/U with provider as scheduled  Her updated medication list for this problem includes:    Atenolol 50 Mg Tabs (Atenolol) .Marland Kitchen... 1/2 tab by mouth daily    Hydrochlorothiazide 25 Mg Tabs (Hydrochlorothiazide) .Marland Kitchen... 1 tab by mouth q am    Losartan Potassium 50 Mg Tabs (Losartan potassium) .Marland Kitchen... 1 tab by mouth daily--use instead of lisinopril  Complete Medication List: 1)  Glucophage 1000 Mg Tabs (Metformin hcl) .Marland Kitchen.. 1 tab by mouth two times a day with meals 2)  Adult Aspirin Ec Low Strength 81 Mg Tbec (Aspirin) .Marland Kitchen.. 1 tab by mouth daily with meal 3)  Protonix 40 Mg Tbec (Pantoprazole sodium) .Marland Kitchen.. 1 tab by mouth daily 4)  Atenolol 50 Mg Tabs (Atenolol) .... 1/2 tab by mouth daily 5)  Isosorbide Mononitrate Cr 30 Mg Tb24 (Isosorbide mononitrate) .Marland Kitchen.. 1 tab by mouth daily 6)  Hydrochlorothiazide 25 Mg Tabs (Hydrochlorothiazide) .Marland Kitchen.. 1 tab by mouth q am 7)  Xyzal 5 Mg Tabs (Levocetirizine dihydrochloride) .Marland Kitchen.. 1 tab by mouth daily 8)  Nasacort Aq 55 Mcg/act Aers (Triamcinolone acetonide(nasal)) .... 2 sprays each nostril daily 9)  Lipitor 40 Mg Tabs (Atorvastatin calcium) .Marland Kitchen.. 1 tab by mouth daily 10)  Glucotrol 5 Mg Tabs (Glipizide) .Marland Kitchen.. 1 tab po two times a day 11)  Blood Glucose Meter Kit (Blood glucose monitoring suppl) .... For daily glucose checks. 12)  Glucometer Test Strp (glucose Blood)  .... Daily testing 13)  Lovaza 1 Gm Caps (Omega-3-acid ethyl esters) .... 4 caps by mouth daily 14)  Caltrate 600+d Plus 600-400 Mg-unit  Chew (Calcium carbonate-vit d-min) .Marland Kitchen.. 1 tab by mouth two times a day 15)  Ibuprofen 600 Mg Tabs (Ibuprofen) .Marland Kitchen.. 1 tab by mouth with food q6h as needed pain 16)  Losartan Potassium 50 Mg Tabs (Losartan potassium) .Marland Kitchen.. 1 tab by mouth daily--use instead of lisinopril 17)  Vitamin B-12 100 Mcg Tabs (Cyanocobalamin) .Marland Kitchen.. 1 tab by mouth daily   Patient Instructions: 1)  Reviewed with Dr. Delrae Alfred 2)  Blood pressure is good. 3)  Continue taking medications as ordered. 4)  Keep appointment with Dr. Delrae Alfred in December for follow-up; you can make an earlier appointment if you need to see her sooner. 5)  Call if anything changes or if you have any questions.   Review of Systems CV:  Complains of fatigue and leg cramps with exertion; denies bluish discoloration of lips or nails, chest pain or discomfort, difficulty breathing at night, difficulty breathing while lying down, fainting, lightheadness, near fainting, palpitations, shortness of breath with exertion, swelling of feet, swelling of hands, and weight gain; upper thigh pain and heel pain.  Has had some fatigue the past couple of days, having some trouble sleeping.Marland Kitchen   Physical Exam  Lungs:  normal respiratory effort, normal breath sounds, no crackles, and no wheezes.   Heart:  normal rate and regular rhythm.     Primary Care Provider:  Julieanne Manson MD  CC:  BP check.  History of Present Illness: Changed to Losartan last visit.  Still having a cough, both non-productive and productive of clear mucus.  Has not taken her medications as of yet this morning.    States she is taking allergy medications.  Continues to have tingling/numbness in hands, primarily in right hand.  Has had some stiffness to her neck, started Saturday through Sunday, not having it currently, states hot shower helped.  Wants to know if muscle relaxer might help.  CC: BP check Is Patient Diabetic? Yes Did you bring your meter with you today? No Pain  Assessment Patient in pain? yes     Location: right heel Intensity: 6 Type: aching  Does patient need assistance? Functional Status Self care Ambulation Normal   Allergies: 1)  Pcn 2)  Codeine 3)  Sulfa 4)  Erythromycin  Orders Added: 1)  Est. Patient Level I [[address]]

## 2010-04-11 NOTE — Progress Notes (Signed)
Summary: cough  Phone Note Call from Patient   Summary of Call: Pt would like to know if the provider can call in for cough medication because she had problems for a month.  At the beginning, the pt had a several cold and ending with cough.  Pt  usually goes to [facility]. Jastin Fore MD Initial call taken by: Manon Hilding,  March 29, 2009 4:19 PM  Follow-up for Phone Call        pt states having symptoms over a month...productive cough but it seems like she just needs something to help get it up.(pt in lobby)Did advised pt that we would have provider review and pt is willing to wait she doesnt want to go through weekend. Or do you want pt to be triaged???  Follow-up by: Mikey College CMA,  April 01, 2009 2:09 PM  Additional Follow-up for Phone Call Additional follow up Details #1::        Fever, color of mucous, shortness of breath? She should really make an appt--does not necessarily need this triaged today if answers to above questions are No, clear to white and no. If she does not want to be seen--recommend sugar free Robitussin (guaifensin--which is an expectorant) Additional Follow-up by: Julieanne Manson MD,  April 01, 2009 2:31 PM    Additional Follow-up for Phone Call Additional follow up Details #2::    Did speak with patient and she denies any other symptoms besides she says that one day she think she had a temp of "110".Marland KitchenMarland KitchenDid advise pt per recommendations of provider for otc meds and that we could schedule her an appt. Pt stated that she would call back later to schedule. But did not seem satisfied with recommendations. Follow-up by: Mikey College CMA,  April 01, 2009 3:46 PM

## 2010-04-11 NOTE — Progress Notes (Signed)
Summary: referral information  Phone Note Other Incoming   Summary of Call: pt cancelled appt with Dr Jarold Motto 03/18/09(GI)  (this is FYI) Initial call taken by: Mikey College CMA,  March 28, 2009 3:46 PM  Follow-up for Phone Call        Why? Follow-up by: Julieanne Manson MD,  April 01, 2009 5:31 PM  Additional Follow-up for Phone Call Additional follow up Details #1::        Left message on answering machine for pt to return call at [phone]. Additional Follow-up by: Vesta Mixer CMA,  April 04, 2009 4:55 PM    Additional Follow-up for Phone Call Additional follow up Details #2::    Pt said she did not want to tell why but that it was for more than one reason and perhaps she will tell you later on.   Follow-up by: Vesta Mixer CMA,  April 06, 2009 11:45 AM  Additional Follow-up for Phone Call Additional follow up Details #3:: Details for Additional Follow-up Action Taken: OK?  Julieanne Manson MD  April 12, 2009 5:37 PM

## 2010-04-11 NOTE — Progress Notes (Signed)
Summary: Persistent cough  Phone Note Call from Patient Call back at [phone]   Caller: Patient Summary of Call: PT WANTS TO KNOW IF THEY CAN CALL IN SOME COUGH MEDS FOR HER, [facility]   Initial call taken by: Oscar La,  December 16, 2009 10:11 AM  Follow-up for Phone Call        Cough is productive of clear phlegm.  Says she's having same symptoms as she was last week during her visit. Follow-up by: Dutch Quint RN,  December 16, 2009 10:40 AM  Additional Follow-up for Phone Call Additional follow up Details #1::        Please se previous phone note--she did not return call and phone note was closed.   I need to know if she is using her corticosteroid nasal spray and Xyzal for this. Additional Follow-up by: Julieanne Manson MD,  December 19, 2009 10:46 AM    Additional Follow-up for Phone Call Additional follow up Details #2::    pt aware [facility]  December 19, 2009 3:13 PM

## 2010-04-11 NOTE — Letter (Signed)
Summary: *HSN Results Follow up  [facility]  [address] [place], [place] [address]   Phone: [phone]  Fax: [phone]      03/21/2009   Leah Norton [address] [place], [place]  [address]   Dear  Ms. [name],                            ____S.Drinkard,FNP   ____D. Gore,FNP       ____B. McPherson,MD   ____V. Rankins,MD    __X__E. Khushbu Pippen,MD    ____N. Daphine Deutscher, FNP  ____D. Reche Dixon, MD    ____K. Philipp Deputy, MD    ____Other     This letter is to inform you that your recent test(s):  _______Pap Smear    _______Lab Test     ___X____X-ray    ___X____ is within acceptable limits  _______ requires a medication change  _______ requires a follow-up lab visit  _______ requires a follow-up visit with your provider   Comments:  Echocardiogram or ultrasound of your heart was normal.  No concerns.  Sorry for the delay.       _________________________________________________________ If you have any questions, please contact our office                     Sincerely,  Julieanne Manson MD [facility]

## 2010-04-11 NOTE — Progress Notes (Signed)
Summary: NEEDS COUGH MEDICINE  Phone Note Call from Patient Call back at [phone] CELL   Summary of Call: MULBERRY PT. MS Kunsman STOPPED BY TO SEE IF SHE CAN GET SOME COUGH MEDICINE CALLED INTO EITHER [facility] OR [facility] ON [address].Francis Dowse IS STILL HAVING THER COUGHING. Initial call taken by: Leodis Rains,  December 05, 2009 8:53 AM  Follow-up for Phone Call        sympotoms very similar to those in last visit cough x 2 weeks clear mucus, chest congestion, small nasal congestion, and sob. pt reuest a cough syrup Robatussin OTC has not helped Follow-up by: Michelle Nasuti,  December 05, 2009 1:18 PM  Additional Follow-up for Phone Call Additional follow up Details #1::        Has she restarted her Nasocort and started the Xyzal?  That's what was prescribed for the cough. Additional Follow-up by: Julieanne Manson MD,  December 07, 2009 9:11 AM    Additional Follow-up for Phone Call Additional follow up Details #2::    [facility]  December 09, 2009 10:30 AM  [facility] Michelle Nasuti  December 12, 2009 9:38 AM  [facility] Michelle Nasuti  December 13, 2009 2:34 PM   file away will address when pt returns call Follow-up by: Michelle Nasuti,  December 13, 2009 2:34 PM

## 2010-04-13 NOTE — Assessment & Plan Note (Signed)
Summary: 3 MONTH FU FOR MULTIPLE CONCERNS//KT   Vital Signs:  Patient profile:   55 year old female Menstrual status:  hysterectomy Weight:      196.44 pounds Temp:     98.8 degrees F oral Pulse rate:   90 / minute Pulse rhythm:   regular Resp:     18 per minute BP sitting:   136 / 84  (left arm) Cuff size:   regular  Vitals Entered By: Hale Drone CMA (February 28, 2010 8:54 AM) CC: 3 month f/u from 11/30/09 for mutiple health concerns.  CBG Result 125 CBG Device ID A Fasting   Primary Care Daegon Deiss:  Julieanne Manson MD  CC:  3 month f/u from 11/30/09 for mutiple health concerns. .  History of Present Illness: 1.  Cough:  better with change in bp meds and allergy meds.  Pt. is a poor historian and difficult to tease out how much improvement she had with each change.  2.  Hyperlipidemia:  has not been able to get her Lovaza--not clear if she is calling it in appropriately.  3.  Headache off and on for one week--started last week.  Right sided--temporal/parietal area.  No posterior head pain.  Felt like a beating in her head.  Has had headaches before, but not one lasting this long.  Noted after awakening in the morning.  When she was busy later in day, realized the pain was better or gone.  Would last 30-60 minutes.  No associated N or V.  No photophobia or phonophobia.  No family history of headaches.  Does have some stressors, but not more than usual.  Has not been sleeping well.    4.  Insomnia:  problems for at least months to possibly a year.  Often goes to bed at 1 a.m.  Watches movies after news.  Gets up at 6:30-7 a.m.  Having difficulties falling asleep and staying asleep.  Gets up to use bathroom.  Drinks water through night--has water at bedtime.  Sleep hygiene not good.  Some exercise.  Does get outside for activity every day.    5.  Anemia:  Still working on getting funds.  6.  DM:  A1C above 8 % today.  Pt. would like to work on lifestyle changes before increasing  medication.    Current Medications (verified): 1)  Glucophage 1000 Mg  Tabs (Metformin Hcl) .Marland Kitchen.. 1 Tab By Mouth Two Times A Day With Meals 2)  Adult Aspirin Ec Low Strength 81 Mg  Tbec (Aspirin) .Marland Kitchen.. 1 Tab By Mouth Daily With Meal 3)  Protonix 40 Mg  Tbec (Pantoprazole Sodium) .Marland Kitchen.. 1 Tab By Mouth Daily 4)  Atenolol 50 Mg  Tabs (Atenolol) .... 1/2 Tab By Mouth Daily 5)  Isosorbide Mononitrate Cr 30 Mg  Tb24 (Isosorbide Mononitrate) .Marland Kitchen.. 1 Tab By Mouth Daily 6)  Hydrochlorothiazide 25 Mg  Tabs (Hydrochlorothiazide) .Marland Kitchen.. 1 Tab By Mouth Q Am 7)  Xyzal 5 Mg Tabs (Levocetirizine Dihydrochloride) .Marland Kitchen.. 1 Tab By Mouth Daily 8)  Nasacort Aq 55 Mcg/act  Aers (Triamcinolone Acetonide(Nasal)) .... 2 Sprays Each Nostril Daily 9)  Lipitor 40 Mg  Tabs (Atorvastatin Calcium) .Marland Kitchen.. 1 Tab By Mouth Daily 10)  Glucotrol 5 Mg Tabs (Glipizide) .Marland Kitchen.. 1 Tab Po Two Times A Day 11)  Blood Glucose Meter   Kit (Blood Glucose Monitoring Suppl) .... For Daily Glucose Checks. 12)  Glucometer  Test   Strp (Glucose Blood) .... Daily Testing 13)  Lovaza 1 Gm  Caps (Omega-3-Acid  Ethyl Esters) .... 4 Caps By Mouth Daily 14)  Caltrate 600+d Plus 600-400 Mg-Unit  Chew (Calcium Carbonate-Vit D-Min) .Marland Kitchen.. 1 Tab By Mouth Two Times A Day 15)  Ibuprofen 600 Mg  Tabs (Ibuprofen) .Marland Kitchen.. 1 Tab By Mouth With Food Q6h As Needed Pain 16)  Losartan Potassium 50 Mg Tabs (Losartan Potassium) .Marland Kitchen.. 1 Tab By Mouth Daily--Use Instead of Lisinopril 17)  Vitamin B-12 100 Mcg Tabs (Cyanocobalamin) .Marland Kitchen.. 1 Tab By Mouth Daily  Allergies (verified): 1)  Pcn 2)  Codeine 3)  Sulfa 4)  Erythromycin  Physical Exam  General:  obese, NAD Head:  NT over soft temporal arteries bilaterally Eyes:  No corneal or conjunctival inflammation noted. EOMI. Perrla. Funduscopic exam benign, without hemorrhages, exudates or papilledema. Vision grossly normal. Ears:  External ear exam shows no significant lesions or deformities.  Otoscopic examination reveals clear canals,  tympanic membranes are intact bilaterally without bulging, retraction, inflammation or discharge. Hearing is grossly normal bilaterally. Nose:  External nasal examination shows no deformity or inflammation. Nasal mucosa are pink and moist without lesions or exudates. Mouth:  pharynx pink and moist.   Neck:  No deformities, masses, or tenderness noted. Lungs:  Normal respiratory effort, chest expands symmetrically. Lungs are clear to auscultation, no crackles or wheezes. Heart:  RRR, systolic murmur.  Radial pulses normal and equal Neurologic:  alert & oriented X3, cranial nerves II-XII intact, and gait normal.    Diabetes Management Exam:    Foot Exam (with socks and/or shoes not present):       Sensory-Monofilament:          Left foot: normal          Right foot: normal   Impression & Recommendations:  Problem # 1:  COUGH DUE TO ACE INHIBITORS (ICD-786.2) Resolved Suspect some element due to allergies  Problem # 2:  ANEMIA (ICD-285.9) Pt. still reticent to get colonoscopy--states she is trying to get money together for this. Her updated medication list for this problem includes:    Vitamin B-12 100 Mcg Tabs (Cyanocobalamin) .Marland Kitchen... 1 tab by mouth daily  Problem # 3:  HYPERLIPIDEMIA (ICD-272.4) has been off Lovaza, so lipid profile will not likely be at goal--hold on check until follow up for CPP Her updated medication list for this problem includes:    Lipitor 40 Mg Tabs (Atorvastatin calcium) .Marland Kitchen... 1 tab by mouth daily    Lovaza 1 Gm Caps (Omega-3-acid ethyl esters) .Marland KitchenMarland KitchenMarland KitchenMarland Kitchen 4 caps by mouth daily  Problem # 4:  HYPERTENSION (ICD-401.9) Controlled Her updated medication list for this problem includes:    Atenolol 50 Mg Tabs (Atenolol) .Marland Kitchen... 1/2 tab by mouth daily    Hydrochlorothiazide 25 Mg Tabs (Hydrochlorothiazide) .Marland Kitchen... 1 tab by mouth q am    Losartan Potassium 50 Mg Tabs (Losartan potassium) .Marland Kitchen... 1 tab by mouth daily--use instead of lisinopril  Problem # 5:  DIABETES  MELLITUS, TYPE II (ICD-250.00)  Pt. prefers to work on lifestyle changes and hold off on increasing Gluctotrol Encouraged her to get moving on this. Her updated medication list for this problem includes:    Glucophage 1000 Mg Tabs (Metformin hcl) .Marland Kitchen... 1 tab by mouth two times a day with meals    Adult Aspirin Ec Low Strength 81 Mg Tbec (Aspirin) .Marland Kitchen... 1 tab by mouth daily with meal    Glucotrol 5 Mg Tabs (Glipizide) .Marland Kitchen... 1 tab po two times a day    Losartan Potassium 50 Mg Tabs (Losartan potassium) .Marland Kitchen... 1 tab by mouth  daily--use instead of lisinopril  Orders: Hemoglobin A1C (83036)  Problem # 6:  HEADACHE (ICD-784.0) Suspect related to stress--hx of neck discomfort, though denies this currently\ Encouraged better sleep habits, which also likely contributing Her updated medication list for this problem includes:    Adult Aspirin Ec Low Strength 81 Mg Tbec (Aspirin) .Marland Kitchen... 1 tab by mouth daily with meal    Atenolol 50 Mg Tabs (Atenolol) .Marland Kitchen... 1/2 tab by mouth daily    Ibuprofen 600 Mg Tabs (Ibuprofen) .Marland Kitchen... 1 tab by mouth with food q6h as needed pain  Problem # 7:  INSOMNIA, CHRONIC (ICD-307.42) Better sleep hygiene discussed  Complete Medication List: 1)  Glucophage 1000 Mg Tabs (Metformin hcl) .Marland Kitchen.. 1 tab by mouth two times a day with meals 2)  Adult Aspirin Ec Low Strength 81 Mg Tbec (Aspirin) .Marland Kitchen.. 1 tab by mouth daily with meal 3)  Protonix 40 Mg Tbec (Pantoprazole sodium) .Marland Kitchen.. 1 tab by mouth daily 4)  Atenolol 50 Mg Tabs (Atenolol) .... 1/2 tab by mouth daily 5)  Isosorbide Mononitrate Cr 30 Mg Tb24 (Isosorbide mononitrate) .Marland Kitchen.. 1 tab by mouth daily 6)  Hydrochlorothiazide 25 Mg Tabs (Hydrochlorothiazide) .Marland Kitchen.. 1 tab by mouth q am 7)  Xyzal 5 Mg Tabs (Levocetirizine dihydrochloride) .Marland Kitchen.. 1 tab by mouth daily 8)  Nasacort Aq 55 Mcg/act Aers (Triamcinolone acetonide(nasal)) .... 2 sprays each nostril daily 9)  Lipitor 40 Mg Tabs (Atorvastatin calcium) .Marland Kitchen.. 1 tab by mouth daily 10)   Glucotrol 5 Mg Tabs (Glipizide) .Marland Kitchen.. 1 tab po two times a day 11)  Blood Glucose Meter Kit (Blood glucose monitoring suppl) .... For daily glucose checks. 12)  Glucometer Test Strp (glucose Blood)  .... Daily testing 13)  Lovaza 1 Gm Caps (Omega-3-acid ethyl esters) .... 4 caps by mouth daily 14)  Caltrate 600+d Plus 600-400 Mg-unit Chew (Calcium carbonate-vit d-min) .Marland Kitchen.. 1 tab by mouth two times a day 15)  Ibuprofen 600 Mg Tabs (Ibuprofen) .Marland Kitchen.. 1 tab by mouth with food q6h as needed pain 16)  Losartan Potassium 50 Mg Tabs (Losartan potassium) .Marland Kitchen.. 1 tab by mouth daily--use instead of lisinopril 17)  Vitamin B-12 100 Mcg Tabs (Cyanocobalamin) .Marland Kitchen.. 1 tab by mouth daily  Other Orders: Flu Vaccine 94yrs + (11914) Admin 1st Vaccine (78295) T-Comprehensive Metabolic Panel [id number])  Patient Instructions: 1)  Schedule Retasure 2)  Really work on getting colonoscopy 3)  Follow up with Dr. Delrae Alfred in 4 months --CPP--please make in morning so can come for fasting labs Prescriptions: LOVAZA 1 GM  CAPS (OMEGA-3-ACID ETHYL ESTERS) 4 caps by mouth daily  #120 x 11   Entered and Authorized by:   Julieanne Manson MD   Signed by:   Julieanne Manson MD on 02/28/2010   Method used:   Faxed to ...       [facility] (retail)       [address] [place], [place]  [address]       Ph: [phone]       Fax: [phone]   RxID:   [id number]    Orders Added: 1)  Flu Vaccine 59yrs + [90658] 2)  Admin 1st Vaccine [90471] 3)  T-Comprehensive Metabolic Panel [80053-22900] 4)  Est. Patient Level IV [64332] 5)  Hemoglobin A1C [83036]   Immunizations Administered:  Influenza Vaccine # 1:    Vaccine Type: Fluvax 3+    Site: left deltoid    Mfr: [employer]    Dose: 0.5 ml  Route: IM    Given by: Hale Drone CMA    Exp. Date: 09/09/2010    Lot #: [id number]    VIS given: 10/04/09 version given February 28, 2010.  Flu Vaccine Consent  Questions:    Do you have a history of severe allergic reactions to this vaccine? no    Any prior history of allergic reactions to egg and/or gelatin? no    Do you have a sensitivity to the preservative Thimersol? no    Do you have a past history of Guillan-Barre Syndrome? no    Do you currently have an acute febrile illness? no    Have you ever had a severe reaction to latex? no    Vaccine information given and explained to patient? yes    Are you currently pregnant? no   Immunizations Administered:  Influenza Vaccine # 1:    Vaccine Type: Fluvax 3+    Site: left deltoid    Mfr: [employer]    Dose: 0.5 ml    Route: IM    Given by: Hale Drone CMA    Exp. Date: 09/09/2010    Lot #: [id number]    VIS given: 10/04/09 version given February 28, 2010.   Diabetic Foot Exam Foot Inspection Is there a history of a foot ulcer?              No Is there a foot ulcer now?              No Can the patient see the bottom of their feet?          Yes Are the shoes appropriate in style and fit?          Yes Is there swelling or an abnormal foot shape?          No Are the toenails long?                No Are the toenails thick?                No Are the toenails ingrown?              Yes Is there heavy callous build-up?              No Is there pain in the calf muscle (Intermittent claudication) when walking?    NoIs there a claw toe deformity?              No Is there elevated skin temperature?            No Is there limited ankle dorsiflexion?            No Is there foot or ankle muscle weakness?            No  Diabetic Foot Care Education    10-g (5.07) Semmes-Weinstein Monofilament Test Performed by: Hale Drone CMA          Right Foot          Left Foot Visual Inspection               Test Control      normal         normal Site 1         normal         normal Site 2         normal         normal Site 3         normal  normal Site 4         normal         normal Site  5         normal         normal Site 6         normal         normal Site 7         normal         normal Site 8         normal         normal Site 9         normal         normal Site 10         normal         normal  Impression      normal         normal    Laboratory Results   Blood Tests   Date/Time Received: February 28, 2010 10:36 AM   HGBA1C: 8.0%   (Normal Range: Non-Diabetic - 3-6%   Control Diabetic - 6-8%) CBG Random:: 125mg /dL

## 2010-04-13 NOTE — Letter (Signed)
Summary: *HSN Results Follow up  [facility]  [address] [place], [place] [address]   Phone: [phone]  Fax: [phone]      03/09/2010   Leah Norton [address] [place], [place]  [address]   Dear  [name],                            ____S.Drinkard,FNP   ____D. Gore,FNP       ____B. McPherson,MD   ____V. Rankins,MD    __X__E. Mulberry,MD    ____N. Daphine Deutscher, FNP  ____D. Reche Dixon, MD    ____K. Philipp Deputy, MD    ____Other     This letter is to inform you that your recent test(s):  _______Pap Smear    ___X____Lab Test     _______X-ray    ____X___ is within acceptable limits  _______ requires a medication change  _______ requires a follow-up lab visit  _______ requires a follow-up visit with your provider   Comments:       _________________________________________________________ If you have any questions, please contact our office                     Sincerely,  Julieanne Manson MD [facility]

## 2010-04-25 ENCOUNTER — Encounter (INDEPENDENT_AMBULATORY_CARE_PROVIDER_SITE_OTHER): Payer: Self-pay | Admitting: Internal Medicine

## 2010-04-25 ENCOUNTER — Encounter: Payer: Self-pay | Admitting: Internal Medicine

## 2010-04-25 LAB — CONVERTED CEMR LAB
Blood Glucose, Fingerstick: 154
Sed Rate: 38 mm/hr — ABNORMAL HIGH (ref 0–22)

## 2010-04-26 ENCOUNTER — Encounter (INDEPENDENT_AMBULATORY_CARE_PROVIDER_SITE_OTHER): Payer: Self-pay | Admitting: Internal Medicine

## 2010-04-27 ENCOUNTER — Encounter (INDEPENDENT_AMBULATORY_CARE_PROVIDER_SITE_OTHER): Payer: Self-pay | Admitting: Internal Medicine

## 2010-05-03 NOTE — Assessment & Plan Note (Signed)
Summary: Neg TB test  Nurse Visit   Allergies: 1)  Pcn 2)  Codeine 3)  Sulfa 4)  Erythromycin  PPD Results    Date of reading: 04/27/2010    Results: < 5mm    Interpretation: negative  Orders Added: 1)  No Charge Patient Arrived (NCPA0) [NCPA0]

## 2010-05-03 NOTE — Letter (Signed)
Summary: RECORDS OF TUBERCULOSIS SCREENING  RECORDS OF TUBERCULOSIS SCREENING   Imported By: Arta Bruce 04/27/2010 14:11:22  _____________________________________________________________________  External Attachment:    Type:   Image     Comment:   External Document

## 2010-05-03 NOTE — Assessment & Plan Note (Signed)
Summary: f/u on headaches   Vital Signs:  Patient profile:   55 year old female Menstrual status:  hysterectomy Weight:      196.56 pounds Temp:     99.1 degrees F oral Pulse rate:   106 / minute Pulse rhythm:   regular Resp:     20 per minute BP sitting:   154 / 90  (left arm) Cuff size:   regular  Vitals Entered By: Hale Drone CMA (April 25, 2010 3:10 PM) CC: f/u on headaches. Still having HA's everyday. Not getting any better or worse. Not sleeping well. Needs TB form completed.  Is Patient Diabetic? Yes Pain Assessment Patient in pain? no      CBG Result 154 CBG Device ID B non fasting  Does patient need assistance? Functional Status Self care Ambulation Normal   Primary Care Provider:  Julieanne Manson MD  CC:  f/u on headaches. Still having HA's everyday. Not getting any better or worse. Not sleeping well. Needs TB form completed. Marland Kitchen  History of Present Illness: 1.  Headaches:  continue to be a problem.  Having a headache every day.  Rarely awakens with one.  Pt. is a poor historian and again, hard to get good information--very nondescript with her answers.  Will not localize--can be anywhere on head.  Possibly more so top of head to right parietal/temporal area.   Unable to characterize--can be sharp, pounding, aching--"a variety of pain".  Can last 5 minutes to several hours.  Takes Tylenol and will relieve, but ultimately will come back, sometimes in same day.  No aura.  Never nausea or vomiting associated. No photophobia or phonophobia.  These headaches started in about mid December.  Has a history of headaches in her 30s--went to a specialist and was able to improve.  Cannot remember the details regarding the treatment.  Cannot recall what the cause of her headaches was back then.  Does not necessarily feel stressed about anything.  Again, not clear with history.  2.  Insomnia:  Discussed poor sleep habits at last visit.  Has improved somewhat.  Getting 5 hours of  sleep nightly on average now.  Generally, has some problems sleeping.  Trying  to keep regular sleep and wake hours.  Trying to walk daily, but not necessarily doing so currently.  Outside every day for 20-30 minutes.  Watching TV if cannot sleep.  Sometimes cleans the house, sometimes reads.   3.  Hypertension:  out of ATenolol last couple of days.  Has not picked up.   Current Medications (verified): 1)  Glucophage 1000 Mg  Tabs (Metformin Hcl) .Marland Kitchen.. 1 Tab By Mouth Two Times A Day With Meals 2)  Adult Aspirin Ec Low Strength 81 Mg  Tbec (Aspirin) .Marland Kitchen.. 1 Tab By Mouth Daily With Meal 3)  Protonix 40 Mg  Tbec (Pantoprazole Sodium) .Marland Kitchen.. 1 Tab By Mouth Daily 4)  Atenolol 50 Mg  Tabs (Atenolol) .... 1/2 Tab By Mouth Daily 5)  Isosorbide Mononitrate Cr 30 Mg  Tb24 (Isosorbide Mononitrate) .Marland Kitchen.. 1 Tab By Mouth Daily 6)  Hydrochlorothiazide 25 Mg  Tabs (Hydrochlorothiazide) .Marland Kitchen.. 1 Tab By Mouth Q Am 7)  Xyzal 5 Mg Tabs (Levocetirizine Dihydrochloride) .Marland Kitchen.. 1 Tab By Mouth Daily 8)  Nasacort Aq 55 Mcg/act  Aers (Triamcinolone Acetonide(Nasal)) .... 2 Sprays Each Nostril Daily 9)  Lipitor 40 Mg  Tabs (Atorvastatin Calcium) .Marland Kitchen.. 1 Tab By Mouth Daily 10)  Glucotrol 5 Mg Tabs (Glipizide) .Marland Kitchen.. 1 Tab Po Two Times A  Day 11)  Blood Glucose Meter   Kit (Blood Glucose Monitoring Suppl) .... For Daily Glucose Checks. 12)  Glucometer  Test   Strp (Glucose Blood) .... Daily Testing 13)  Lovaza 1 Gm  Caps (Omega-3-Acid Ethyl Esters) .... 4 Caps By Mouth Daily 14)  Caltrate 600+d Plus 600-400 Mg-Unit  Chew (Calcium Carbonate-Vit D-Min) .Marland Kitchen.. 1 Tab By Mouth Two Times A Day 15)  Ibuprofen 600 Mg  Tabs (Ibuprofen) .Marland Kitchen.. 1 Tab By Mouth With Food Q6h As Needed Pain 16)  Losartan Potassium 50 Mg Tabs (Losartan Potassium) .Marland Kitchen.. 1 Tab By Mouth Daily--Use Instead of Lisinopril 17)  Vitamin B-12 100 Mcg Tabs (Cyanocobalamin) .Marland Kitchen.. 1 Tab By Mouth Daily  Allergies (verified): 1)  Pcn 2)  Codeine 3)  Sulfa 4)   Erythromycin  Physical Exam  General:  NAD Head:  NT over head and sinuses Eyes:  No corneal or conjunctival inflammation noted. EOMI. Perrla. Funduscopic exam benign, without hemorrhages, exudates or papilledema. Vision grossly normal. Ears:  External ear exam shows no significant lesions or deformities.  Otoscopic examination reveals clear canals, tympanic membranes are intact bilaterally without bulging, retraction, inflammation or discharge. Hearing is grossly normal bilaterally. Nose:  External nasal examination shows no deformity or inflammation. Nasal mucosa are pink and moist without lesions or exudates. Mouth:  pharynx pink and moist.   Lungs:  Normal respiratory effort, chest expands symmetrically. Lungs are clear to auscultation, no crackles or wheezes. Heart:  Normal rate and regular rhythm. S1 and S2 normal without gallop, murmur, click, rub or other extra sounds.  RAdial pulses normal and equal Neurologic:  alert & oriented X3 and cranial nerves II-XII intact.    No focal findings   Impression & Recommendations:  Problem # 1:  INSOMNIA, CHRONIC (ICD-307.42) To work on sleep habits again--discussed at [employer] Trazodone at bedtime.  Problem # 2:  HEADACHE (ICD-784.0)  Start Trazodone. Feel this is related to sleep and possibly stressors. If no improvement with above, will refer to HA clinic. Check sed rate Her updated medication list for this problem includes:    Adult Aspirin Ec Low Strength 81 Mg Tbec (Aspirin) .Marland Kitchen... 1 tab by mouth daily with meal    Atenolol 50 Mg Tabs (Atenolol) .Marland Kitchen... 1/2 tab by mouth daily    Ibuprofen 600 Mg Tabs (Ibuprofen) .Marland Kitchen... 1 tab by mouth with food q6h as needed pain  Orders: T-Sed Rate (Automated) (83382-50539)  Problem # 3:  HYPERTENSION (ICD-401.9) To not run out of Atenolol or any meds--encouraged calling in before runs out. Her updated medication list for this problem includes:    Atenolol 50 Mg Tabs (Atenolol) .Marland Kitchen... 1/2 tab by  mouth daily    Hydrochlorothiazide 25 Mg Tabs (Hydrochlorothiazide) .Marland Kitchen... 1 tab by mouth q am    Losartan Potassium 50 Mg Tabs (Losartan potassium) .Marland Kitchen... 1 tab by mouth daily--use instead of lisinopril  Complete Medication List: 1)  Glucophage 1000 Mg Tabs (Metformin hcl) .Marland Kitchen.. 1 tab by mouth two times a day with meals 2)  Adult Aspirin Ec Low Strength 81 Mg Tbec (Aspirin) .Marland Kitchen.. 1 tab by mouth daily with meal 3)  Protonix 40 Mg Tbec (Pantoprazole sodium) .Marland Kitchen.. 1 tab by mouth daily 4)  Atenolol 50 Mg Tabs (Atenolol) .... 1/2 tab by mouth daily 5)  Isosorbide Mononitrate Cr 30 Mg Tb24 (Isosorbide mononitrate) .Marland Kitchen.. 1 tab by mouth daily 6)  Hydrochlorothiazide 25 Mg Tabs (Hydrochlorothiazide) .Marland Kitchen.. 1 tab by mouth q am 7)  Xyzal 5 Mg Tabs (Levocetirizine dihydrochloride) .Marland KitchenMarland KitchenMarland Kitchen  1 tab by mouth daily 8)  Nasacort Aq 55 Mcg/act Aers (Triamcinolone acetonide(nasal)) .... 2 sprays each nostril daily 9)  Lipitor 40 Mg Tabs (Atorvastatin calcium) .Marland Kitchen.. 1 tab by mouth daily 10)  Glucotrol 5 Mg Tabs (Glipizide) .Marland Kitchen.. 1 tab po two times a day 11)  Blood Glucose Meter Kit (Blood glucose monitoring suppl) .... For daily glucose checks. 12)  Glucometer Test Strp (glucose Blood)  .... Daily testing 13)  Lovaza 1 Gm Caps (Omega-3-acid ethyl esters) .... 4 caps by mouth daily 14)  Caltrate 600+d Plus 600-400 Mg-unit Chew (Calcium carbonate-vit d-min) .Marland Kitchen.. 1 tab by mouth two times a day 15)  Ibuprofen 600 Mg Tabs (Ibuprofen) .Marland Kitchen.. 1 tab by mouth with food q6h as needed pain 16)  Losartan Potassium 50 Mg Tabs (Losartan potassium) .Marland Kitchen.. 1 tab by mouth daily--use instead of lisinopril 17)  Vitamin B-12 100 Mcg Tabs (Cyanocobalamin) .Marland Kitchen.. 1 tab by mouth daily 18)  Trazodone Hcl 50 Mg Tabs (Trazodone hcl) .... 1/2 to 1 tab by mouth at bedtime as needed sleep  Other Orders: Capillary Blood Glucose/CBG ([address]) TB Skin Test (60454) Admin 1st Vaccine (09811)  Patient Instructions: 1)  Follow up with Dr. Delrae Alfred in6-8 weeks for  headache and insomnia Prescriptions: TRAZODONE HCL 50 MG TABS (TRAZODONE HCL) 1/2 to 1 tab by mouth at bedtime as needed sleep  #30 x 2   Entered and Authorized by:   Julieanne Manson MD   Signed by:   Julieanne Manson MD on 04/25/2010   Method used:   Faxed to ...       [facility] (retail)       [address] [place], [place]  [address]       Ph: [phone]       Fax: [phone]   RxID:   [id number]    Orders Added: 1)  Capillary Blood Glucose/CBG [82948] 2)  TB Skin Test [86580] 3)  Admin 1st Vaccine [90471] 4)  T-Sed Rate (Automated) [66440-34742] 5)  Est. Patient Level IV [59563]   Immunizations Administered:  PPD Skin Test:    Vaccine Type: PPD    Site: right forearm    Mfr: [employer]    Dose: 0.1 ml    Route: ID    Given by: Hale Drone CMA    Exp. Date: 08/10/2011    Lot #: [id number]   Immunizations Administered:  PPD Skin Test:    Vaccine Type: PPD    Site: right forearm    Mfr: [employer]    Dose: 0.1 ml    Route: ID    Given by: Hale Drone CMA    Exp. Date: 08/10/2011    Lot #: [id number]  Appended Document: f/u on headaches Addition to PE:  NT over temporal arteries, the arteries are easily compressible, without thickening.

## 2010-05-03 NOTE — Letter (Signed)
Summary: *HSN Results Follow up  [facility]  [address] [place], [place] [address]   Phone: [phone]  Fax: [phone]      04/26/2010   Leah Norton [address] [place], [place]  [address]   Dear  Ms. Cheralyn Rybarczyk,                            ____S.Drinkard,FNP   ____D. Gore,FNP       ____B. McPherson,MD   ____V. Rankins,MD    __X__E. Hally Colella,MD    ____N. Daphine Deutscher, FNP  ____D. Reche Dixon, MD    ____K. Philipp Deputy, MD    ____Other     This letter is to inform you that your recent test(s):  _______Pap Smear    ___X____Lab Test     _______X-ray    ___X____ is within acceptable limits  _______ requires a medication change  _______ requires a follow-up lab visit  _______ requires a follow-up visit with your provider   Comments:  Your test for inflammation in body is normal for your age group.       _________________________________________________________ If you have any questions, please contact our office                     Sincerely,  Julieanne Manson MD [facility]

## 2010-06-17 ENCOUNTER — Inpatient Hospital Stay (INDEPENDENT_AMBULATORY_CARE_PROVIDER_SITE_OTHER)
Admission: RE | Admit: 2010-06-17 | Discharge: 2010-06-17 | Disposition: A | Discharge status: Home / Self Care | Payer: Self-pay | Source: Ambulatory Visit | Attending: Emergency Medicine | Admitting: Emergency Medicine

## 2010-06-17 DIAGNOSIS — K047 Periapical abscess without sinus: Secondary | ICD-10-CM

## 2010-06-17 DIAGNOSIS — K089 Disorder of teeth and supporting structures, unspecified: Secondary | ICD-10-CM

## 2010-06-17 DIAGNOSIS — R6889 Other general symptoms and signs: Secondary | ICD-10-CM

## 2010-06-17 DIAGNOSIS — K029 Dental caries, unspecified: Secondary | ICD-10-CM

## 2010-06-22 LAB — POCT URINALYSIS DIP (DEVICE)
Bilirubin Urine: NEGATIVE
Glucose, UA: NEGATIVE mg/dL
Nitrite: NEGATIVE
Urobilinogen, UA: 0.2 mg/dL (ref 0.0–1.0)

## 2010-07-28 NOTE — Cardiovascular Report (Signed)
NAME:  Leah Norton, Leah Norton                       ACCOUNT NO.:  [id number]   MEDICAL RECORD NO.:  [id number]                   PATIENT TYPE:  OIB   LOCATION:  2853                                 FACILITY:  [facility]   PHYSICIAN:  Leah Norton, M.D.                  DATE OF BIRTH:  February 20, 1956   DATE OF PROCEDURE:  08/05/2003  DATE OF DISCHARGE:  08/05/2003                              CARDIAC CATHETERIZATION   REFERRING PHYSICIAN:  Cala Bradford R. Renae Norton, M.D.   PROCEDURES PERFORMED:  1. Left ventriculography.  2. Selective left and right coronary arteriography.  3. Abdominal aortogram.  4. Right femoral angiography and closure of right femoral artery access with     Angio-Seal.   CARDIOLOGIST:  Leah Norton, M.D.   INDICATIONS:  Leah Norton is a 55 year old female with history of  hypertension, diabetes, hyperlipidemia, smoking, and obesity who had been  complaining of recurrent chest pain.  She underwent a Cardiolite stress  test, which showed probable anterior wall ischemia.  Given this she was  brought to the cardiac catheterization lab to reevaluate her coronary  anatomy.   HEMODYNAMIC DATA:  The left ventricular pressure was 148/0 with an end-  diastolic pressure of 27 mmHg.  The aortic pressure was 147/82 with a mean  of 109 mmHg.  There was no pressure gradient across the aortic valve.   ANGIOGRAPHIC DATA:  Left Ventricle:  The left ventricular systolic function  was in the low limits of normal and the ejection fraction was estimated at  50-55%.   Right Coronary Artery:  The right coronary artery is a dominant vessel.  It  gives origin to two small PDAs and a moderate size PLA.  The RCA in the mid  segment has 30% luminal irregularity.   Left Main Coronary Artery:  The left main coronary artery is a large caliber  vessel.  It is normal.   Circumflex:  The circumflex coronary artery is a large caliber vessel.  It  gives origin to a high obtuse marginal-1, which has  ostial 40% stenosis.  The circumflex itself is very large and has a large area of distribution.  It smooth.   Left Anterior Descending Artery:  The left anterior descending artery gives  origin to a very large diagonal-1.  It is very tortuous in its mid-to-distal  segment.  There is mild luminal irregularity.   ABDOMINAL AORTOGRAM:  The abdominal aortogram revealed the presence of two  renal arteries on the right and one renal artery on the left.  They are  widely patent.  The iliac __________ is widely patent.  The right femoral  artery shows mild luminal irregularity.   IMPRESSION:  1. Mildly elevated left ventricular end diastolic pressure suggesting     diastolic dysfunction secondary to hypertension with hypertensive heart     disease.  2. Noncritical coronary artery disease.  3. Noncritical peripheral vascular disease.  RECOMMENDATIONS:  1. Aggressive risk factor modification; and,  2. Continued medical therapy are advised.  3. The patient has been counselled against smoking.   TECHNIQUE OF THE PROCEDURE:  Under the usual sterile precautions using a 6  French right femoral artery access 6 [place] multipurpose B-2 catheter was  advanced into the ascending aorta with a 0.035 inch guidewire.  The catheter  was gently advanced into the left ventricle.  Left ventricular pressures  were monitored.  Hand contrast injection in the left ventricle was performed  both in the LAO and RAO projection.  The catheter was flushed with saline  and pulled back into the ascending aorta and pressure gradient across the  aortic valve was monitored.  He right coronary artery was selectively  engaged and angiography was performed.  In a similar fashion the left main  coronary artery was selectively engaged and angiography was performed.  Then  the catheter was pulled back into the abdominal aorta and abdominal  aortogram was performed.  Then the catheter was pulled out of the body in  the usual  fashion.  Right femoral angiography was performed through the  arterial access sheath  and the access was closed with Angio-Seal with  excellent hemostasis obtained.   The patient tolerated the procedure well.   COMPLICATIONS:  No complications were noted.                                               Leah Norton, M.D.    Leah Norton  D:  08/05/2003  T:  08/06/2003  Job:  [id number]   cc:   Leah Norton. Renae Norton, M.D.  [address]  [address]  [place]  [place] [address]  Fax: [phone]   [facility]

## 2010-12-09 ENCOUNTER — Inpatient Hospital Stay (INDEPENDENT_AMBULATORY_CARE_PROVIDER_SITE_OTHER)
Admission: RE | Admit: 2010-12-09 | Discharge: 2010-12-09 | Disposition: A | Discharge status: Home / Self Care | Payer: Self-pay | Source: Ambulatory Visit | Attending: Family Medicine | Admitting: Family Medicine

## 2010-12-09 DIAGNOSIS — R05 Cough: Secondary | ICD-10-CM

## 2010-12-09 DIAGNOSIS — R059 Cough, unspecified: Secondary | ICD-10-CM

## 2010-12-18 ENCOUNTER — Other Ambulatory Visit: Payer: Self-pay | Admitting: Internal Medicine

## 2010-12-18 DIAGNOSIS — Z1231 Encounter for screening mammogram for malignant neoplasm of breast: Secondary | ICD-10-CM

## 2010-12-26 ENCOUNTER — Ambulatory Visit (HOSPITAL_COMMUNITY)
Admission: RE | Admit: 2010-12-26 | Discharge: 2010-12-26 | Disposition: A | Discharge status: Home / Self Care | Payer: Self-pay | Source: Ambulatory Visit | Attending: Internal Medicine | Admitting: Internal Medicine

## 2010-12-26 DIAGNOSIS — Z1231 Encounter for screening mammogram for malignant neoplasm of breast: Secondary | ICD-10-CM | POA: Insufficient documentation

## 2011-02-22 ENCOUNTER — Other Ambulatory Visit: Payer: Self-pay | Admitting: Internal Medicine

## 2011-02-22 ENCOUNTER — Ambulatory Visit (HOSPITAL_COMMUNITY)
Admission: RE | Admit: 2011-02-22 | Discharge: 2011-02-22 | Disposition: A | Discharge status: Home / Self Care | Payer: Self-pay | Source: Ambulatory Visit | Attending: Internal Medicine | Admitting: Internal Medicine

## 2011-02-22 DIAGNOSIS — R52 Pain, unspecified: Secondary | ICD-10-CM

## 2011-02-22 DIAGNOSIS — M25559 Pain in unspecified hip: Secondary | ICD-10-CM | POA: Insufficient documentation

## 2011-02-22 DIAGNOSIS — R05 Cough: Secondary | ICD-10-CM

## 2011-03-01 ENCOUNTER — Encounter: Payer: Self-pay | Admitting: Emergency Medicine

## 2011-03-01 ENCOUNTER — Emergency Department (HOSPITAL_COMMUNITY)
Admission: EM | Admit: 2011-03-01 | Discharge: 2011-03-01 | Disposition: A | Discharge status: Home / Self Care | Payer: Self-pay | Source: Home / Self Care | Attending: Family Medicine | Admitting: Family Medicine

## 2011-03-01 DIAGNOSIS — J069 Acute upper respiratory infection, unspecified: Secondary | ICD-10-CM

## 2011-03-01 HISTORY — DX: Essential (primary) hypertension: I10

## 2011-03-01 MED ORDER — IPRATROPIUM BROMIDE 0.06 % NA SOLN: 2.0000 | Freq: Four times a day (QID) | NASAL | Status: DC

## 2011-03-01 MED ORDER — DEXTROMETHORPHAN POLISTIREX 30 MG/5ML PO LQCR: 60.0000 mg | Freq: Two times a day (BID) | ORAL | Status: AC

## 2011-03-01 NOTE — ED Provider Notes (Signed)
History     CSN: [id number]  Arrival date & time 03/01/11  1811   First MD Initiated Contact with Patient 03/01/11 1826      Chief Complaint  Patient presents with  . Nasal Congestion  . Palpitations  . URI    (Consider location/radiation/quality/duration/timing/severity/associated sxs/prior treatment) Patient is a 55 y.o. female presenting with URI. The history is provided by the patient.  URI The primary symptoms include ear pain and cough. Primary symptoms do not include fever, sore throat, wheezing, abdominal pain, nausea, vomiting or myalgias. The current episode started 2 days ago. This is a new problem.  The onset of the illness is associated with exposure to sick contacts. Symptoms associated with the illness include plugged ear sensation, congestion and rhinorrhea.    Past Medical History  Diagnosis Date  . Hypertension   . Diabetes mellitus     History reviewed. No pertinent past surgical history.  History reviewed. No pertinent family history.  History  Substance Use Topics  . Smoking status: Current Everyday Smoker  . Smokeless tobacco: Not on file  . Alcohol Use: No    OB History    Grav Para Term Preterm Abortions TAB SAB Ect Mult Living                  Review of Systems  Constitutional: Negative for fever.  HENT: Positive for ear pain, congestion and rhinorrhea. Negative for sore throat.   Respiratory: Positive for cough. Negative for wheezing.   Gastrointestinal: Negative.  Negative for nausea, vomiting and abdominal pain.  Musculoskeletal: Negative for myalgias.    Allergies  Codeine; Erythromycin; Penicillins; and Sulfonamide derivatives  Home Medications   Current Outpatient Rx  Name Route Sig Dispense Refill  . ATENOLOL 50 MG PO TABS Oral Take 50 mg by mouth daily.      Marland Kitchen LORATADINE 10 MG PO TABS Oral Take 10 mg by mouth daily.      Marland Kitchen LOSARTAN POTASSIUM 50 MG PO TABS Oral Take 50 mg by mouth daily.      Marland Kitchen METFORMIN HCL 1000 MG PO  TABS Oral Take 1,000 mg by mouth 2 (two) times daily with a meal.      . PANTOPRAZOLE SODIUM 40 MG PO TBEC Oral Take 40 mg by mouth daily.      Marland Kitchen DEXTROMETHORPHAN POLISTIREX ER 30 MG/5ML PO LQCR Oral Take 10 mLs (60 mg total) by mouth 2 (two) times daily. 89 mL 0  . IPRATROPIUM BROMIDE 0.06 % NA SOLN Nasal Place 2 sprays into the nose 4 (four) times daily. 15 mL 1    BP 151/79  Pulse 71  Temp(Src) 99 F (37.2 C) (Oral)  Resp 20  SpO2 98%  Physical Exam  Nursing note and vitals reviewed. Constitutional: She is oriented to person, place, and time. She appears well-developed and well-nourished.  HENT:  Head: Normocephalic.  Right Ear: External ear normal.  Left Ear: External ear normal.  Mouth/Throat: Oropharynx is clear and moist.  Neck: Normal range of motion. Neck supple.  Cardiovascular: Normal rate, regular rhythm, normal heart sounds and intact distal pulses.   Pulmonary/Chest: Effort normal and breath sounds normal.  Lymphadenopathy:    She has no cervical adenopathy.  Neurological: She is alert and oriented to person, place, and time.  Skin: Skin is warm and dry.    ED Course  Procedures (including critical care time)  Labs Reviewed - No data to display No results found.   1. URI (upper respiratory  infection)       MDM          Barkley Bruns, MD 03/01/11 2005

## 2011-03-01 NOTE — ED Notes (Signed)
Pt here with chest congestion,stiff neck and heart palpitation that started 2 dys ago.pt has been taking otc mucinex dm but not working.

## 2011-04-03 ENCOUNTER — Emergency Department (HOSPITAL_COMMUNITY)
Admission: EM | Admit: 2011-04-03 | Discharge: 2011-04-04 | Disposition: A | Discharge status: Home / Self Care | Payer: Self-pay | Attending: Emergency Medicine | Admitting: Emergency Medicine

## 2011-04-03 ENCOUNTER — Encounter (HOSPITAL_COMMUNITY): Payer: Self-pay | Admitting: Emergency Medicine

## 2011-04-03 ENCOUNTER — Emergency Department (INDEPENDENT_AMBULATORY_CARE_PROVIDER_SITE_OTHER)
Admission: EM | Admit: 2011-04-03 | Discharge: 2011-04-03 | Disposition: A | Discharge status: Nursing Home (Includes ICF) | Payer: Self-pay | Source: Home / Self Care | Attending: Emergency Medicine | Admitting: Emergency Medicine

## 2011-04-03 DIAGNOSIS — K5792 Diverticulitis of intestine, part unspecified, without perforation or abscess without bleeding: Secondary | ICD-10-CM

## 2011-04-03 DIAGNOSIS — Z79899 Other long term (current) drug therapy: Secondary | ICD-10-CM | POA: Insufficient documentation

## 2011-04-03 DIAGNOSIS — K5732 Diverticulitis of large intestine without perforation or abscess without bleeding: Secondary | ICD-10-CM | POA: Insufficient documentation

## 2011-04-03 DIAGNOSIS — R350 Frequency of micturition: Secondary | ICD-10-CM | POA: Insufficient documentation

## 2011-04-03 DIAGNOSIS — R6883 Chills (without fever): Secondary | ICD-10-CM | POA: Insufficient documentation

## 2011-04-03 DIAGNOSIS — E278 Other specified disorders of adrenal gland: Secondary | ICD-10-CM

## 2011-04-03 DIAGNOSIS — E119 Type 2 diabetes mellitus without complications: Secondary | ICD-10-CM | POA: Insufficient documentation

## 2011-04-03 DIAGNOSIS — E279 Disorder of adrenal gland, unspecified: Secondary | ICD-10-CM | POA: Insufficient documentation

## 2011-04-03 DIAGNOSIS — I10 Essential (primary) hypertension: Secondary | ICD-10-CM | POA: Insufficient documentation

## 2011-04-03 DIAGNOSIS — R300 Dysuria: Secondary | ICD-10-CM | POA: Insufficient documentation

## 2011-04-03 DIAGNOSIS — R1032 Left lower quadrant pain: Secondary | ICD-10-CM | POA: Insufficient documentation

## 2011-04-03 DIAGNOSIS — R109 Unspecified abdominal pain: Secondary | ICD-10-CM

## 2011-04-03 LAB — COMPREHENSIVE METABOLIC PANEL
ALT: 23 U/L (ref 0–35)
AST: 17 U/L (ref 0–37)
Alkaline Phosphatase: 106 U/L (ref 39–117)
CO2: 26 mEq/L (ref 19–32)
Chloride: 99 mEq/L (ref 96–112)
GFR calc non Af Amer: >90 mL/min (ref >90)
Potassium: 3.7 mEq/L (ref 3.5–5.1)
Sodium: 135 mEq/L (ref 135–145)
Total Bilirubin: 0.3 mg/dL (ref 0.3–1.2)

## 2011-04-03 LAB — DIFFERENTIAL
Basophils Absolute: 0 10*3/uL (ref 0.0–0.1)
Lymphocytes Relative: 15 % (ref 12–46)
Monocytes Absolute: 0.9 10*3/uL (ref 0.1–1.0)
Neutro Abs: 10.7 10*3/uL — ABNORMAL HIGH (ref 1.7–7.7)
Neutrophils Relative %: 77 % (ref 43–77)

## 2011-04-03 LAB — POCT URINALYSIS DIP (DEVICE)
Ketones, ur: NEGATIVE mg/dL
Leukocytes, UA: NEGATIVE
Nitrite: NEGATIVE
Protein, ur: NEGATIVE mg/dL

## 2011-04-03 LAB — URINALYSIS, ROUTINE W REFLEX MICROSCOPIC
Bilirubin Urine: NEGATIVE
Glucose, UA: NEGATIVE mg/dL
Hgb urine dipstick: NEGATIVE
Protein, ur: NEGATIVE mg/dL
Urobilinogen, UA: 0.2 mg/dL (ref 0.0–1.0)

## 2011-04-03 LAB — CBC
HCT: 35.9 % — ABNORMAL LOW (ref 36.0–46.0)
Platelets: 399 10*3/uL (ref 150–400)
RDW: 14.2 % (ref 11.5–15.5)
WBC: 13.9 10*3/uL — ABNORMAL HIGH (ref 4.0–10.5)

## 2011-04-03 MED ORDER — ONDANSETRON HCL 4 MG/2ML IJ SOLN
4.0000 mg | Freq: Once | INTRAMUSCULAR | Status: AC
  Administered 2011-04-03: 4 mg via INTRAVENOUS
  Filled 2011-04-03: qty 2

## 2011-04-03 MED ORDER — FENTANYL CITRATE 0.05 MG/ML IJ SOLN
50.0000 ug | Freq: Once | INTRAMUSCULAR | Status: AC
  Administered 2011-04-03: 50 ug via INTRAVENOUS
  Filled 2011-04-03: qty 2

## 2011-04-03 MED ORDER — SODIUM CHLORIDE 0.9 % IV BOLUS (SEPSIS)
500.0000 mL | Freq: Once | INTRAVENOUS | Status: AC
  Administered 2011-04-03: 500 mL via INTRAVENOUS

## 2011-04-03 NOTE — ED Notes (Signed)
PT. SENT FROM [facility] FOR FURTHER EVALUATION ON LLQ PAIN ONSET LAST Sunday ,DENIES VOMITTING OR DIARRHEA , NO FEVER OR CHILLS.

## 2011-04-03 NOTE — ED Notes (Signed)
Pt. Stated, I've had lower abd. Pain for about a week

## 2011-04-03 NOTE — ED Notes (Signed)
Pt was instructed to drink omnipaque solution.  Pt is alert and oriented and does not appear to be in any respiratory distress.

## 2011-04-03 NOTE — ED Provider Notes (Signed)
History     CSN: [id number]  Arrival date & time 04/03/11  1804   First MD Initiated Contact with Patient 04/03/11 1806      Chief Complaint  Patient presents with  . Abdominal Pain    (Consider location/radiation/quality/duration/timing/severity/associated sxs/prior treatment) HPI Comments: Been hurting since Sunday but its getting worse (point to LLQ AND SUPRAPUBIC AREA)  Patient is a 56 y.o. female presenting with abdominal pain.  Abdominal Pain The primary symptoms of the illness include abdominal pain and fever. The primary symptoms of the illness do not include shortness of breath, nausea, vomiting, diarrhea, dysuria, vaginal discharge or vaginal bleeding. Episode onset: Since Sunday. The problem has been gradually worsening.  Additional symptoms associated with the illness include chills, anorexia and frequency. Symptoms associated with the illness do not include diaphoresis, constipation, hematuria or back pain.    Past Medical History  Diagnosis Date  . Hypertension   . Diabetes mellitus     History reviewed. No pertinent past surgical history.  History reviewed. No pertinent family history.  History  Substance Use Topics  . Smoking status: Current Everyday Smoker  . Smokeless tobacco: Not on file  . Alcohol Use: No    OB History    Grav Para Term Preterm Abortions TAB SAB Ect Mult Living                  Review of Systems  Constitutional: Positive for fever and chills. Negative for diaphoresis.  Respiratory: Negative for shortness of breath.   Gastrointestinal: Positive for abdominal pain and anorexia. Negative for nausea, vomiting, diarrhea and constipation.  Genitourinary: Positive for frequency. Negative for dysuria, hematuria, vaginal bleeding and vaginal discharge.  Musculoskeletal: Negative for back pain.    Allergies  Codeine; Erythromycin; Penicillins; and Sulfonamide derivatives  Home Medications   Current Outpatient Rx  Name Route Sig  Dispense Refill  . BUPROPION HCL 100 MG PO TABS Oral Take 100 mg by mouth 2 (two) times daily.    . ATENOLOL 50 MG PO TABS Oral Take 50 mg by mouth daily.      . IPRATROPIUM BROMIDE 0.06 % NA SOLN Nasal Place 2 sprays into the nose 4 (four) times daily. 15 mL 1  . LORATADINE 10 MG PO TABS Oral Take 10 mg by mouth daily.      Marland Kitchen LOSARTAN POTASSIUM 50 MG PO TABS Oral Take 50 mg by mouth daily.      Marland Kitchen METFORMIN HCL 1000 MG PO TABS Oral Take 1,000 mg by mouth 2 (two) times daily with a meal.      . PANTOPRAZOLE SODIUM 40 MG PO TBEC Oral Take 40 mg by mouth daily.        BP 158/61  Pulse 86  Temp(Src) 100.3 F (37.9 C) (Oral)  Resp 18  SpO2 97%  Physical Exam  Nursing note and vitals reviewed. Constitutional: She appears well-developed and well-nourished. No distress.  HENT:  Head: Normocephalic.  Eyes: Conjunctivae are normal. No scleral icterus.  Neck: Neck supple. No JVD present.  Pulmonary/Chest: Effort normal.  Abdominal: Soft. She exhibits no distension and no mass. There is no splenomegaly or hepatomegaly. There is tenderness. There is no rebound, no guarding, no tenderness at McBurney's point and negative Murphy's sign. No hernia. Hernia confirmed negative in the ventral area.    Lymphadenopathy:    She has no cervical adenopathy.  Skin: Skin is warm. No erythema.    ED Course  Procedures (including critical care time)  Labs Reviewed  POCT URINALYSIS DIP (DEVICE)  POCT URINALYSIS DIPSTICK   No results found.   1. Abdominal pain       MDM  Focal exam- tenderness LUQ, Febrile worsening pain- concerning exam- transfered to the ED to rule out complicated diverticulitis.        Jimmie Molly, MD 04/03/11 [phone]

## 2011-04-03 NOTE — ED Provider Notes (Signed)
History     CSN: [id number]  Arrival date & time 04/03/11  1931   First MD Initiated Contact with Patient 04/03/11 2103      Chief Complaint  Patient presents with  . Abdominal Pain    (Consider location/radiation/quality/duration/timing/severity/associated sxs/prior treatment) HPI  Patient presents to emergency department complaining of left lower abdominal pain after being seen at an urgent care and sent to ER for further evaluation. Patient states pain was gradual onset and started 3 days ago waxing and waning throughout the day however increasing in severity. Patient states she's had mild dysuria and increased urinary frequency. She denies known fevers but has felt chilled. She denies back or flank pain. She denies vomiting, diarrhea, or nausea. Patient states she took a laxative 2 days ago hoping that the symptoms were from constipation however the laxative did not improve pain. Patient has taken nothing else for pain. Patient has history of hysterectomy. Patient states she has not had a colonoscopy yet but denies hx of blood in her stool or painful bowel movements. Patient denies aggravating or alleviating factors.  Past Medical History  Diagnosis Date  . Hypertension   . Diabetes mellitus     Past Surgical History  Procedure Date  . Abdominal hysterectomy     History reviewed. No pertinent family history.  History  Substance Use Topics  . Smoking status: Current Everyday Smoker -- 0.5 packs/day  . Smokeless tobacco: Not on file  . Alcohol Use: No    OB History    Grav Para Term Preterm Abortions TAB SAB Ect Mult Living                  Review of Systems  All other systems reviewed and are negative.    Allergies  Codeine; Erythromycin; Penicillins; and Sulfonamide derivatives  Home Medications   Current Outpatient Rx  Name Route Sig Dispense Refill  . VITAMIN C PO Oral Take 1 tablet by mouth daily.    . ATENOLOL 50 MG PO TABS Oral Take 50 mg by mouth  daily.      . BUPROPION HCL 100 MG PO TABS Oral Take 100 mg by mouth 2 (two) times daily.    Marland Kitchen CALCIUM PO Oral Take 1 tablet by mouth daily.    Marland Kitchen VITAMIN D PO Oral Take 1 tablet by mouth daily.    Marland Kitchen LOSARTAN POTASSIUM 50 MG PO TABS Oral Take 50 mg by mouth daily.      Marland Kitchen METFORMIN HCL 1000 MG PO TABS Oral Take 1,000 mg by mouth 2 (two) times daily with a meal.      . PANTOPRAZOLE SODIUM 40 MG PO TBEC Oral Take 40 mg by mouth daily.        BP 155/77  Pulse 89  Temp(Src) 99.4 F (37.4 C) (Oral)  Resp 20  SpO2 97%  Physical Exam  Nursing note and vitals reviewed. Constitutional: She is oriented to person, place, and time. She appears well-developed and well-nourished. No distress.  HENT:  Head: Normocephalic and atraumatic.  Eyes: Conjunctivae are normal.  Neck: Normal range of motion. Neck supple.  Cardiovascular: Normal rate, regular rhythm, normal heart sounds and intact distal pulses.  Exam reveals no gallop and no friction rub.   No murmur heard. Pulmonary/Chest: Effort normal and breath sounds normal. No respiratory distress. She has no wheezes. She has no rales. She exhibits no tenderness.  Abdominal: Soft. Bowel sounds are normal. She exhibits no distension and no mass. There is tenderness.  There is no rebound and no guarding.       Mild to moderate tenderness to palpation of left lower cause urine/left suprapubic region without rebound tenderness, guarding, or rigidity. No CVA tenderness to palpation  Musculoskeletal: Normal range of motion. She exhibits no edema and no tenderness.  Neurological: She is alert and oriented to person, place, and time.  Skin: Skin is warm and dry. No rash noted. She is not diaphoretic. No erythema.  Psychiatric: She has a normal mood and affect.    ED Course  Procedures (including critical care time)  IV fluids, fentanyl, and Zofran  Labs Reviewed  CBC - Abnormal; Notable for the following:    WBC 13.9 (*)    Hemoglobin 11.9 (*)    HCT  35.9 (*)    All other components within normal limits  DIFFERENTIAL - Abnormal; Notable for the following:    Neutro Abs 10.7 (*)    All other components within normal limits  COMPREHENSIVE METABOLIC PANEL - Abnormal; Notable for the following:    Glucose, Bld 151 (*)    All other components within normal limits  URINALYSIS, ROUTINE W REFLEX MICROSCOPIC   No results found.   No diagnosis found.    MDM  Sign out given to Dr. Hyacinth Meeker because of LLQ pain without elevated white count and fever, CT pending with dispo pending CT results. Patient VSS. Improvement of pain with IV pain meds.         Lenon Oms [place], [place] 04/04/11 [phone]

## 2011-04-04 ENCOUNTER — Emergency Department (HOSPITAL_COMMUNITY): Payer: Self-pay

## 2011-04-04 MED ORDER — METRONIDAZOLE 500 MG PO TABS: 500.0000 mg | ORAL_TABLET | Freq: Two times a day (BID) | ORAL | Status: AC

## 2011-04-04 MED ORDER — METRONIDAZOLE 500 MG PO TABS
500.0000 mg | ORAL_TABLET | Freq: Once | ORAL | Status: AC
  Administered 2011-04-04: 500 mg via ORAL
  Filled 2011-04-04: qty 1

## 2011-04-04 MED ORDER — CIPROFLOXACIN HCL 500 MG PO TABS: 500.0000 mg | ORAL_TABLET | Freq: Two times a day (BID) | ORAL | Status: AC

## 2011-04-04 MED ORDER — CIPROFLOXACIN HCL 500 MG PO TABS
500.0000 mg | ORAL_TABLET | Freq: Once | ORAL | Status: AC
  Administered 2011-04-04: 500 mg via ORAL
  Filled 2011-04-04: qty 1

## 2011-04-04 MED ORDER — IOHEXOL 300 MG/ML  SOLN
100.0000 mL | Freq: Once | INTRAMUSCULAR | Status: AC | PRN
  Administered 2011-04-04: 100 mL via INTRAVENOUS

## 2011-04-04 MED ORDER — FENTANYL CITRATE 0.05 MG/ML IJ SOLN: 50.0000 ug | Freq: Once | INTRAMUSCULAR | Status: DC

## 2011-04-04 MED ORDER — HYDROCODONE-ACETAMINOPHEN 5-500 MG PO TABS
1.0000 | ORAL_TABLET | Freq: Four times a day (QID) | ORAL | Status: AC | PRN
Start: 2011-04-04 — End: 2011-04-14

## 2011-04-04 MED ORDER — ONDANSETRON HCL 4 MG/2ML IJ SOLN: 4.0000 mg | Freq: Once | INTRAMUSCULAR | Status: DC

## 2011-04-04 NOTE — ED Provider Notes (Signed)
Medical screening examination/treatment/procedure(s) were conducted as a shared visit with non-physician practitioner(s) and myself.  I personally evaluated the patient during the encounter   Patient presents with left lower quadrant pain which has been overall persistent over the last 3 days. She denies any fevers but has felt a slight chill. There's been no nausea vomiting or diarrhea. She states that she has never had diverticulitis in the past and has had no urinary symptoms.  Physical exam:  Ill-appearing, no tachycardia or murmurs, lungs clear, abdomen soft but has tenderness focally in the left lower quadrant without rebound or guarding. Non-peritoneal at this time. Vital signs otherwise without fever or tachycardia  Assessment:  Patient likely has diverticulitis, CT scan of performed and reviewed by myself and the radiologist showing that she doesn't act have mild diverticulitis. 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 Prescription is given for ciprofloxacin, Flagyl, hydrocodone.  Vida Roller, MD 04/04/11 0230

## 2011-04-04 NOTE — ED Notes (Signed)
Pt is asking about CT results.  Explained to her that the doctor will have to review them and discuss the results with her.

## 2011-04-04 NOTE — ED Notes (Signed)
Pt refused fentanyl and zofran.  Denies pain and nausea.  Informed pt that there will be a wait for CT and will call to see how long it will take for her to be transported.   Will hold medication upon return from CT.

## 2011-04-04 NOTE — ED Notes (Signed)
Security paged for escort to pt's vehicle.

## 2011-04-04 NOTE — ED Notes (Signed)
Pt transported to CT ?

## 2011-05-11 ENCOUNTER — Other Ambulatory Visit: Payer: Self-pay | Admitting: Internal Medicine

## 2011-05-11 DIAGNOSIS — E278 Other specified disorders of adrenal gland: Secondary | ICD-10-CM

## 2011-05-17 ENCOUNTER — Ambulatory Visit (HOSPITAL_COMMUNITY)
Admission: RE | Admit: 2011-05-17 | Discharge: 2011-05-17 | Disposition: A | Discharge status: Home / Self Care | Payer: Self-pay | Source: Ambulatory Visit | Attending: Internal Medicine | Admitting: Internal Medicine

## 2011-05-17 DIAGNOSIS — K7689 Other specified diseases of liver: Secondary | ICD-10-CM | POA: Insufficient documentation

## 2011-05-17 DIAGNOSIS — D3500 Benign neoplasm of unspecified adrenal gland: Secondary | ICD-10-CM | POA: Insufficient documentation

## 2011-05-17 DIAGNOSIS — E278 Other specified disorders of adrenal gland: Secondary | ICD-10-CM

## 2011-05-17 DIAGNOSIS — E279 Disorder of adrenal gland, unspecified: Secondary | ICD-10-CM | POA: Insufficient documentation

## 2011-05-17 LAB — CREATININE, SERUM
Creatinine, Ser: 0.6 mg/dL (ref 0.50–1.10)
GFR calc Af Amer: >90 mL/min (ref >90)
GFR calc non Af Amer: >90 mL/min (ref >90)

## 2011-06-06 ENCOUNTER — Ambulatory Visit (HOSPITAL_COMMUNITY)
Admission: RE | Admit: 2011-06-06 | Discharge: 2011-06-06 | Disposition: A | Discharge status: Home / Self Care | Payer: Self-pay | Source: Ambulatory Visit | Attending: Internal Medicine | Admitting: Internal Medicine

## 2011-06-06 DIAGNOSIS — D3500 Benign neoplasm of unspecified adrenal gland: Secondary | ICD-10-CM | POA: Insufficient documentation

## 2011-06-06 DIAGNOSIS — K7689 Other specified diseases of liver: Secondary | ICD-10-CM | POA: Insufficient documentation

## 2011-06-06 MED ORDER — GADOBENATE DIMEGLUMINE 529 MG/ML IV SOLN
20.0000 mL | Freq: Once | INTRAVENOUS | Status: AC
  Administered 2011-06-06: 20 mL via INTRAVENOUS

## 2011-07-10 ENCOUNTER — Ambulatory Visit (INDEPENDENT_AMBULATORY_CARE_PROVIDER_SITE_OTHER): Payer: BC Managed Care – PPO | Admitting: Family Medicine

## 2011-07-10 VITALS — BP 161/89 | HR 79 | Temp 98.4°F | Resp 18 | Ht 64.5 in | Wt 192.0 lb

## 2011-07-10 DIAGNOSIS — J40 Bronchitis, not specified as acute or chronic: Secondary | ICD-10-CM

## 2011-07-10 DIAGNOSIS — H9209 Otalgia, unspecified ear: Secondary | ICD-10-CM

## 2011-07-10 DIAGNOSIS — K0889 Other specified disorders of teeth and supporting structures: Secondary | ICD-10-CM

## 2011-07-10 DIAGNOSIS — E119 Type 2 diabetes mellitus without complications: Secondary | ICD-10-CM

## 2011-07-10 DIAGNOSIS — K089 Disorder of teeth and supporting structures, unspecified: Secondary | ICD-10-CM

## 2011-07-10 DIAGNOSIS — H9202 Otalgia, left ear: Secondary | ICD-10-CM

## 2011-07-10 DIAGNOSIS — F17200 Nicotine dependence, unspecified, uncomplicated: Secondary | ICD-10-CM

## 2011-07-10 DIAGNOSIS — Z720 Tobacco use: Secondary | ICD-10-CM

## 2011-07-10 MED ORDER — TRAMADOL HCL 50 MG PO TABS: 50.0000 mg | ORAL_TABLET | Freq: Three times a day (TID) | ORAL | Status: AC | PRN

## 2011-07-10 MED ORDER — PROMETHAZINE-DM 6.25-15 MG/5ML PO SYRP: 5.0000 mL | ORAL_SOLUTION | Freq: Four times a day (QID) | ORAL | Status: AC | PRN

## 2011-07-10 MED ORDER — BENZONATATE 100 MG PO CAPS: ORAL_CAPSULE | ORAL | Status: AC

## 2011-07-10 MED ORDER — AZITHROMYCIN 250 MG PO TABS: ORAL_TABLET | ORAL | Status: AC

## 2011-07-10 MED ORDER — HYDROCODONE-HOMATROPINE 5-1.5 MG/5ML PO SYRP: 5.0000 mL | ORAL_SOLUTION | Freq: Three times a day (TID) | ORAL | Status: DC | PRN

## 2011-07-10 NOTE — Patient Instructions (Signed)
Take the cough medication and antibiotics as ordered. Drink lots of  Fluids. If she were getting worse return for recheck.  Make up your mind to stop smoking as we discussed. Set a target date about 1 month ahead of time. Paperback until you get down to 5 or 6 cigarettes a day, and are new quit date quit the cigarettes completely even if you're miserable. Makeup your mind that you will never touch another cigarette, even in the event of a major life crisis, that you will not use them to try and calm the nerves. You'll be glad you quit.  Use the pain pills as necessary for the dental pain, but make sure you followup with a dentist.

## 2011-07-10 NOTE — Progress Notes (Signed)
Subjective: 56 year old Afro-American female who is here with a one-month history or respiratory tract infection. Started with a head cold that settled into her chest. She has persisted with a cough. She's tried various OTC cough medicines without a lot of relief. Early on in the course of things she had some leftover Tessalon that she tried. Early in the year she was treated with a course of Zithromax (Z-Pak )by someone. She did well with that. She does not have a lot of head congestion now.  She does smoke about one half pack of cigarettes per day.  She's had problems with some dental pain, and plans to see a dentist next week. She denies pain in her left ear also.  Yesterday she had stomach upset but it may have been from taking some Motrin. She is on medications for diabetes and high blood pressure.   Objective: Pleasant lady who looks like she doesn't feel real good. Her TMs appear entirely normal. Jaw joint does not seem tender. Throat is clear. She does have poor dentition with some particularly bad stubs of teeth on the left upper teeth line. Neck was supple without significant nodes. Chest has some coarse breath sounds at both bases. Notified wheezing could be heard. Abdomen was soft without mass or tenderness.  Assessment: Bronchitis Cigarette smoker Diabetes Hypertension unsatisfactory  Control Dental caries and pain Left otalgia secondary to dentition dyspepsia secondary to Motrin  Plan: Antibiotics and cough medication. Had a long discussion about stopping smoking. It is an absolute necessity for somebody who has diabetes and high blood pressure. It is also causing her to not be able to get rid of this  We checked with her pharmacy. She is previously taken Zithromax, Cipro, and Flagyl. With so many allergies we need to be cautious.

## 2011-12-08 ENCOUNTER — Emergency Department (INDEPENDENT_AMBULATORY_CARE_PROVIDER_SITE_OTHER): Payer: BC Managed Care – PPO

## 2011-12-08 ENCOUNTER — Emergency Department (HOSPITAL_COMMUNITY)
Admission: EM | Admit: 2011-12-08 | Discharge: 2011-12-08 | Disposition: A | Discharge status: Home / Self Care | Payer: BC Managed Care – PPO | Source: Home / Self Care

## 2011-12-08 ENCOUNTER — Encounter (HOSPITAL_COMMUNITY): Payer: Self-pay | Admitting: Emergency Medicine

## 2011-12-08 DIAGNOSIS — J40 Bronchitis, not specified as acute or chronic: Secondary | ICD-10-CM

## 2011-12-08 MED ORDER — ALBUTEROL SULFATE HFA 108 (90 BASE) MCG/ACT IN AERS: 1.0000 | INHALATION_SPRAY | Freq: Four times a day (QID) | RESPIRATORY_TRACT | Status: DC | PRN

## 2011-12-08 MED ORDER — DOXYCYCLINE HYCLATE 100 MG PO CAPS: 100.0000 mg | ORAL_CAPSULE | Freq: Two times a day (BID) | ORAL | Status: DC

## 2011-12-08 MED ORDER — TRAMADOL HCL 50 MG PO TABS: 50.0000 mg | ORAL_TABLET | Freq: Four times a day (QID) | ORAL | Status: DC | PRN

## 2011-12-08 NOTE — ED Notes (Signed)
Pt has had a productive cough x 1wk with clear mucus.  Pt states that she is having some light chest pain that started last night. Pt denies fever and any other symptoms.  Pt has back pain x 1 wk in the center of her back in between her shoulder blades. Pt denies any injury.

## 2011-12-08 NOTE — ED Provider Notes (Signed)
Medical screening examination/treatment/procedure(s) were performed by resident physician or non-physician practitioner and as supervising physician I was immediately available for consultation/collaboration.   Tyarra Nolton DOUGLAS MD.    Chaundra Abreu D Azariah Bonura, MD 12/08/11 1526 

## 2011-12-08 NOTE — ED Provider Notes (Signed)
History     CSN: [id number]  Arrival date & time 12/08/11  4098   None     Chief Complaint  Patient presents with  . Cough  . Back Pain    (Consider location/radiation/quality/duration/timing/severity/associated sxs/prior treatment) Patient is a 56 y.o. female presenting with cough. The history is provided by the patient. No language interpreter was used.  Cough This is a new problem. The current episode started more than 1 week ago. The problem occurs constantly. The problem has been gradually worsening. The cough is non-productive. There has been no fever. Associated symptoms include chest pain. She has tried nothing for the symptoms. She is a smoker. Her past medical history does not include bronchitis.  Pt reports she has had a cough for a week.  Pt reports she began having pain in her chest with coughing yesterday. Pt has diffuse pain   Past Medical History  Diagnosis Date  . Hypertension   . Diabetes mellitus     Past Surgical History  Procedure Date  . Abdominal hysterectomy     History reviewed. No pertinent family history.  History  Substance Use Topics  . Smoking status: Current Every Day Smoker -- 0.5 packs/day  . Smokeless tobacco: Not on file  . Alcohol Use: No    OB History    Grav Para Term Preterm Abortions TAB SAB Ect Mult Living                  Review of Systems  Respiratory: Positive for cough.   Cardiovascular: Positive for chest pain.  All other systems reviewed and are negative.    Allergies  Codeine; Erythromycin; Penicillins; Sulfonamide derivatives; and Vicodin  Home Medications   Current Outpatient Rx  Name Route Sig Dispense Refill  . VITAMIN C PO Oral Take 1 tablet by mouth daily.    . ATENOLOL 50 MG PO TABS Oral Take 50 mg by mouth daily.      . BUPROPION HCL 100 MG PO TABS Oral Take 100 mg by mouth 2 (two) times daily.    Marland Kitchen CALCIUM PO Oral Take 1 tablet by mouth daily.    Marland Kitchen VITAMIN D PO Oral Take 1 tablet by mouth daily.     Marland Kitchen LOSARTAN POTASSIUM 50 MG PO TABS Oral Take 50 mg by mouth daily.      Marland Kitchen METFORMIN HCL 1000 MG PO TABS Oral Take 1,000 mg by mouth 2 (two) times daily with a meal.      . PANTOPRAZOLE SODIUM 40 MG PO TBEC Oral Take 40 mg by mouth daily.        BP 148/77  Pulse 74  Temp 98.2 F (36.8 C) (Oral)  Resp 16  SpO2 95%  Physical Exam  Nursing note and vitals reviewed. Constitutional: She appears well-developed and well-nourished.  HENT:  Head: Normocephalic and atraumatic.  Right Ear: External ear normal.  Nose: Nose normal.  Mouth/Throat: Oropharynx is clear and moist.  Eyes: Conjunctivae normal and EOM are normal. Pupils are equal, round, and reactive to light.  Neck: Neck supple.  Cardiovascular: Normal rate and normal heart sounds.   Pulmonary/Chest: Effort normal and breath sounds normal.  Abdominal: Soft.  Musculoskeletal: Normal range of motion.  Neurological: She is alert.  Skin: Skin is warm.  Psychiatric: She has a normal mood and affect.    ED Course  Procedures (including critical care time)  Labs Reviewed - No data to display No results found.   No diagnosis found.  MDM   Date: 12/08/2011  Rate: 67  Rhythm: normal sinus rhythm  QRS Axis: normal  Intervals: normal  ST/T Wave abnormalities: nonspecific T wave changes  Conduction Disutrbances:none  Narrative Interpretation:   Old EKG Reviewed: unchanged  12/08/2011   I will treat for bronchitis.  Doxycycline and  albutreol inhaler.  Pt advised to see her Md for 3-4 days recheck   Elson Areas, [place] 12/08/11 [address] [place], [place] 12/08/11 1044

## 2012-04-22 ENCOUNTER — Other Ambulatory Visit (HOSPITAL_COMMUNITY): Payer: Self-pay | Admitting: Internal Medicine

## 2012-04-22 DIAGNOSIS — Z1231 Encounter for screening mammogram for malignant neoplasm of breast: Secondary | ICD-10-CM

## 2012-04-30 ENCOUNTER — Ambulatory Visit (HOSPITAL_COMMUNITY)
Admission: RE | Admit: 2012-04-30 | Discharge: 2012-04-30 | Disposition: A | Discharge status: Home / Self Care | Payer: BC Managed Care – PPO | Source: Ambulatory Visit | Attending: Internal Medicine | Admitting: Internal Medicine

## 2012-04-30 DIAGNOSIS — Z1231 Encounter for screening mammogram for malignant neoplasm of breast: Secondary | ICD-10-CM | POA: Insufficient documentation

## 2012-05-28 ENCOUNTER — Encounter: Payer: BC Managed Care – PPO | Admitting: Obstetrics & Gynecology

## 2012-11-06 ENCOUNTER — Encounter (HOSPITAL_COMMUNITY): Payer: Self-pay

## 2012-11-06 ENCOUNTER — Emergency Department (HOSPITAL_COMMUNITY): Payer: BC Managed Care – PPO

## 2012-11-06 ENCOUNTER — Emergency Department (HOSPITAL_COMMUNITY)
Admission: EM | Admit: 2012-11-06 | Discharge: 2012-11-06 | Disposition: A | Discharge status: Home / Self Care | Payer: BC Managed Care – PPO | Attending: Emergency Medicine | Admitting: Emergency Medicine

## 2012-11-06 DIAGNOSIS — S82899A Other fracture of unspecified lower leg, initial encounter for closed fracture: Secondary | ICD-10-CM | POA: Insufficient documentation

## 2012-11-06 DIAGNOSIS — Z880 Allergy status to penicillin: Secondary | ICD-10-CM | POA: Insufficient documentation

## 2012-11-06 DIAGNOSIS — S59909A Unspecified injury of unspecified elbow, initial encounter: Secondary | ICD-10-CM | POA: Insufficient documentation

## 2012-11-06 DIAGNOSIS — S6990XA Unspecified injury of unspecified wrist, hand and finger(s), initial encounter: Secondary | ICD-10-CM | POA: Insufficient documentation

## 2012-11-06 DIAGNOSIS — E119 Type 2 diabetes mellitus without complications: Secondary | ICD-10-CM | POA: Insufficient documentation

## 2012-11-06 DIAGNOSIS — Y92410 Unspecified street and highway as the place of occurrence of the external cause: Secondary | ICD-10-CM | POA: Insufficient documentation

## 2012-11-06 DIAGNOSIS — S82891A Other fracture of right lower leg, initial encounter for closed fracture: Secondary | ICD-10-CM

## 2012-11-06 DIAGNOSIS — Z79899 Other long term (current) drug therapy: Secondary | ICD-10-CM | POA: Insufficient documentation

## 2012-11-06 DIAGNOSIS — F17200 Nicotine dependence, unspecified, uncomplicated: Secondary | ICD-10-CM | POA: Insufficient documentation

## 2012-11-06 DIAGNOSIS — S298XXA Other specified injuries of thorax, initial encounter: Secondary | ICD-10-CM | POA: Insufficient documentation

## 2012-11-06 DIAGNOSIS — Y9389 Activity, other specified: Secondary | ICD-10-CM | POA: Insufficient documentation

## 2012-11-06 DIAGNOSIS — I10 Essential (primary) hypertension: Secondary | ICD-10-CM | POA: Insufficient documentation

## 2012-11-06 LAB — CBC
MCH: 29.7 pg (ref 26.0–34.0)
MCHC: 33.6 g/dL (ref 30.0–36.0)
Platelets: 354 10*3/uL (ref 150–400)

## 2012-11-06 LAB — BASIC METABOLIC PANEL
Calcium: 9.9 mg/dL (ref 8.4–10.5)
GFR calc Af Amer: >90 mL/min (ref >90)
GFR calc non Af Amer: >90 mL/min (ref >90)
Potassium: 3.3 mEq/L — ABNORMAL LOW (ref 3.5–5.1)
Sodium: 137 mEq/L (ref 135–145)

## 2012-11-06 MED ORDER — HYDROMORPHONE HCL PF 1 MG/ML IJ SOLN: 1.0000 mg | Freq: Once | INTRAMUSCULAR | Status: DC

## 2012-11-06 MED ORDER — FENTANYL CITRATE 0.05 MG/ML IJ SOLN
50.0000 ug | INTRAMUSCULAR | Status: DC | PRN
  Administered 2012-11-06: 50 ug via INTRAVENOUS
  Filled 2012-11-06: qty 2

## 2012-11-06 MED ORDER — ONDANSETRON HCL 4 MG/2ML IJ SOLN
4.0000 mg | Freq: Once | INTRAMUSCULAR | Status: AC
  Administered 2012-11-06: 4 mg via INTRAVENOUS
  Filled 2012-11-06: qty 2

## 2012-11-06 MED ORDER — HYDROMORPHONE HCL PF 1 MG/ML IJ SOLN
1.0000 mg | Freq: Once | INTRAMUSCULAR | Status: AC
  Administered 2012-11-06: 1 mg via INTRAVENOUS
  Filled 2012-11-06: qty 1

## 2012-11-06 MED ORDER — POTASSIUM CHLORIDE CRYS ER 20 MEQ PO TBCR
40.0000 meq | EXTENDED_RELEASE_TABLET | Freq: Once | ORAL | Status: AC
  Administered 2012-11-06: 40 meq via ORAL
  Filled 2012-11-06: qty 2

## 2012-11-06 MED ORDER — OXYCODONE-ACETAMINOPHEN 5-325 MG PO TABS: 2.0000 | ORAL_TABLET | ORAL | Status: DC | PRN

## 2012-11-06 MED ORDER — SODIUM CHLORIDE 0.9 % IV SOLN
INTRAVENOUS | Status: DC
  Administered 2012-11-06: 07:00:00 via INTRAVENOUS

## 2012-11-06 NOTE — ED Notes (Signed)
Dr. Blackman at bedside. 
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
## 2012-11-06 NOTE — ED Notes (Signed)
Dr. Dierdre Highman back in with patient.
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
## 2012-11-07 ENCOUNTER — Other Ambulatory Visit (HOSPITAL_COMMUNITY): Payer: Self-pay | Admitting: Orthopaedic Surgery

## 2012-11-07 DIAGNOSIS — S82891S Other fracture of right lower leg, sequela: Secondary | ICD-10-CM

## 2012-11-11 ENCOUNTER — Other Ambulatory Visit (HOSPITAL_COMMUNITY): Payer: Self-pay | Admitting: Orthopaedic Surgery

## 2012-11-11 ENCOUNTER — Ambulatory Visit (HOSPITAL_COMMUNITY)
Admission: RE | Admit: 2012-11-11 | Discharge: 2012-11-11 | Disposition: A | Discharge status: Home / Self Care | Payer: BC Managed Care – PPO | Source: Ambulatory Visit | Attending: Orthopaedic Surgery | Admitting: Orthopaedic Surgery

## 2012-11-11 ENCOUNTER — Encounter (HOSPITAL_COMMUNITY): Payer: Self-pay | Admitting: *Deleted

## 2012-11-11 DIAGNOSIS — X58XXXA Exposure to other specified factors, initial encounter: Secondary | ICD-10-CM | POA: Insufficient documentation

## 2012-11-11 DIAGNOSIS — S82891S Other fracture of right lower leg, sequela: Secondary | ICD-10-CM

## 2012-11-11 DIAGNOSIS — S93439A Sprain of tibiofibular ligament of unspecified ankle, initial encounter: Secondary | ICD-10-CM | POA: Insufficient documentation

## 2012-11-11 DIAGNOSIS — S82209A Unspecified fracture of shaft of unspecified tibia, initial encounter for closed fracture: Secondary | ICD-10-CM | POA: Insufficient documentation

## 2012-11-11 DIAGNOSIS — S82843A Displaced bimalleolar fracture of unspecified lower leg, initial encounter for closed fracture: Secondary | ICD-10-CM | POA: Insufficient documentation

## 2012-11-11 MED ORDER — CLINDAMYCIN PHOSPHATE 900 MG/50ML IV SOLN
900.0000 mg | INTRAVENOUS | Status: AC
  Administered 2012-11-12: 900 mg via INTRAVENOUS
  Filled 2012-11-11: qty 50

## 2012-11-11 NOTE — H&P (Signed)
PREOPERATIVE H&P  Chief Complaint: Right Ankle Fracture  HPI: Leah Norton is a 57 y.o. female who presents for discussion of her ankle injury with a diagnosis of Right Ankle Fracture. Symptoms are rated as moderate to severe, and have been worsening.  This is significantly impairing activities of daily living.  She has elected for surgical management.   Past Medical History  Diagnosis Date  . Hypertension   . Diabetes mellitus   . GERD (gastroesophageal reflux disease)   . Constipation   . Arthritis    Past Surgical History  Procedure Laterality Date  . Abdominal hysterectomy    . Mouth surgery      teeth removed   History   Social History  . Marital Status: Single    Spouse Name: N/A    Number of Children: N/A  . Years of Education: N/A   Social History Main Topics  . Smoking status: Current Every Day Smoker -- 0.70 packs/day for 10 years  . Smokeless tobacco: None  . Alcohol Use: No     Comment: special occasions  . Drug Use: No  . Sexual Activity: None   Other Topics Concern  . None   Social History Narrative  . None   History reviewed. No pertinent family history. Allergies  Allergen Reactions  . Codeine     REACTION: Rash  . Erythromycin     REACTION: Rash  . Penicillins     REACTION: Rash  . Sulfonamide Derivatives     REACTION: rash  . Vicodin [Hydrocodone-Acetaminophen] Rash   Prior to Admission medications   Medication Sig Start Date End Date Taking? Authorizing Provider  albuterol (PROVENTIL HFA;VENTOLIN HFA) 108 (90 BASE) MCG/ACT inhaler Inhale 1-2 puffs into the lungs every 6 (six) hours as needed for wheezing. 12/08/11   Elson Areas, PA-C  Ascorbic Acid (VITAMIN C PO) Take 1 tablet by mouth daily.    Historical Provider, MD  atenolol (TENORMIN) 50 MG tablet Take 50 mg by mouth daily.      Historical Provider, MD  buPROPion (WELLBUTRIN SR) 150 MG 12 hr tablet Take 150 mg by mouth 2 (two) times daily as needed (for mood).    Historical  Provider, MD  CALCIUM PO Take 1 tablet by mouth daily.    Historical Provider, MD  Cholecalciferol (VITAMIN D PO) Take 1 tablet by mouth daily.    Historical Provider, MD  hydrochlorothiazide (HYDRODIURIL) 25 MG tablet Take 25 mg by mouth daily.    Historical Provider, MD  isosorbide mononitrate (IMDUR) 30 MG 24 hr tablet Take 30 mg by mouth daily.    Historical Provider, MD  losartan (COZAAR) 50 MG tablet Take 50 mg by mouth daily.      Historical Provider, MD  pravastatin (PRAVACHOL) 40 MG tablet Take 40 mg by mouth daily.    Historical Provider, MD  Ranitidine HCl (ZANTAC PO) Take 1 tablet by mouth daily as needed (for reflux).     Historical Provider, MD  sitaGLIPtin-metformin (JANUMET) 50-1000 MG per tablet Take 1 tablet by mouth 2 (two) times daily with a meal.    Historical Provider, MD     Positive ROS: All other systems have been reviewed and were otherwise negative with the exception of those mentioned in the HPI and as above.  Physical Exam: General: Alert, no acute distress Cardiovascular: No pedal edema Respiratory: No cyanosis, no use of accessory musculature GI: No organomegaly, abdomen is soft and non-tender Skin: No lesions in the area of  chief complaint Neurologic: Sensation intact distally Psychiatric: Patient is competent for consent with normal mood and affect Lymphatic: No axillary or cervical lymphadenopathy  MUSCULOSKELETAL: RLE: well fitting splint, toes wwp, NVI  Assessment: Right Ankle Fracture  Plan: Plan for Procedure(s): OPEN REDUCTION INTERNAL FIXATION (ORIF) ANKLE FRACTURE  The risks benefits and alternatives were discussed with the patient including but not limited to the risks of nonoperative treatment, versus surgical intervention including infection, bleeding, nerve injury,  blood clots, cardiopulmonary complications, morbidity, mortality, among others, and they were willing to proceed.   Cheral Almas, MD   11/11/2012 8:42 PM

## 2012-11-12 ENCOUNTER — Ambulatory Visit (HOSPITAL_COMMUNITY): Payer: BC Managed Care – PPO

## 2012-11-12 ENCOUNTER — Encounter (HOSPITAL_COMMUNITY): Payer: Self-pay | Admitting: Anesthesiology

## 2012-11-12 ENCOUNTER — Encounter (HOSPITAL_COMMUNITY)
Admission: RE | Disposition: A | Discharge status: Skilled Nursing Facility | Payer: Self-pay | Source: Ambulatory Visit | Attending: Orthopaedic Surgery

## 2012-11-12 ENCOUNTER — Inpatient Hospital Stay (HOSPITAL_COMMUNITY)
Admission: RE | Admit: 2012-11-12 | Discharge: 2012-11-17 | DRG: 219 | Disposition: A | Discharge status: Skilled Nursing Facility | Payer: BC Managed Care – PPO | Source: Ambulatory Visit | Attending: Orthopaedic Surgery | Admitting: Orthopaedic Surgery

## 2012-11-12 ENCOUNTER — Ambulatory Visit (HOSPITAL_COMMUNITY): Payer: BC Managed Care – PPO | Admitting: Anesthesiology

## 2012-11-12 ENCOUNTER — Encounter (HOSPITAL_COMMUNITY): Payer: Self-pay | Admitting: *Deleted

## 2012-11-12 DIAGNOSIS — D485 Neoplasm of uncertain behavior of skin: Secondary | ICD-10-CM

## 2012-11-12 DIAGNOSIS — S82853A Displaced trimalleolar fracture of unspecified lower leg, initial encounter for closed fracture: Principal | ICD-10-CM | POA: Diagnosis present

## 2012-11-12 DIAGNOSIS — E785 Hyperlipidemia, unspecified: Secondary | ICD-10-CM

## 2012-11-12 DIAGNOSIS — I10 Essential (primary) hypertension: Secondary | ICD-10-CM | POA: Diagnosis present

## 2012-11-12 DIAGNOSIS — J3089 Other allergic rhinitis: Secondary | ICD-10-CM

## 2012-11-12 DIAGNOSIS — K219 Gastro-esophageal reflux disease without esophagitis: Secondary | ICD-10-CM | POA: Diagnosis present

## 2012-11-12 DIAGNOSIS — J069 Acute upper respiratory infection, unspecified: Secondary | ICD-10-CM

## 2012-11-12 DIAGNOSIS — J42 Unspecified chronic bronchitis: Secondary | ICD-10-CM

## 2012-11-12 DIAGNOSIS — M79609 Pain in unspecified limb: Secondary | ICD-10-CM

## 2012-11-12 DIAGNOSIS — I517 Cardiomegaly: Secondary | ICD-10-CM

## 2012-11-12 DIAGNOSIS — K089 Disorder of teeth and supporting structures, unspecified: Secondary | ICD-10-CM

## 2012-11-12 DIAGNOSIS — K5900 Constipation, unspecified: Secondary | ICD-10-CM

## 2012-11-12 DIAGNOSIS — M6740 Ganglion, unspecified site: Secondary | ICD-10-CM

## 2012-11-12 DIAGNOSIS — F17200 Nicotine dependence, unspecified, uncomplicated: Secondary | ICD-10-CM | POA: Diagnosis present

## 2012-11-12 DIAGNOSIS — E119 Type 2 diabetes mellitus without complications: Secondary | ICD-10-CM | POA: Diagnosis present

## 2012-11-12 DIAGNOSIS — D649 Anemia, unspecified: Secondary | ICD-10-CM

## 2012-11-12 DIAGNOSIS — S82891A Other fracture of right lower leg, initial encounter for closed fracture: Secondary | ICD-10-CM

## 2012-11-12 DIAGNOSIS — M5137 Other intervertebral disc degeneration, lumbosacral region: Secondary | ICD-10-CM

## 2012-11-12 DIAGNOSIS — R05 Cough: Secondary | ICD-10-CM

## 2012-11-12 DIAGNOSIS — R748 Abnormal levels of other serum enzymes: Secondary | ICD-10-CM

## 2012-11-12 DIAGNOSIS — R011 Cardiac murmur, unspecified: Secondary | ICD-10-CM

## 2012-11-12 DIAGNOSIS — R51 Headache: Secondary | ICD-10-CM

## 2012-11-12 DIAGNOSIS — G4700 Insomnia, unspecified: Secondary | ICD-10-CM

## 2012-11-12 DIAGNOSIS — M25551 Pain in right hip: Secondary | ICD-10-CM

## 2012-11-12 DIAGNOSIS — M545 Low back pain: Secondary | ICD-10-CM

## 2012-11-12 DIAGNOSIS — Z780 Asymptomatic menopausal state: Secondary | ICD-10-CM

## 2012-11-12 DIAGNOSIS — Z79899 Other long term (current) drug therapy: Secondary | ICD-10-CM

## 2012-11-12 HISTORY — DX: Constipation, unspecified: K59.00

## 2012-11-12 HISTORY — DX: Gastro-esophageal reflux disease without esophagitis: K21.9

## 2012-11-12 HISTORY — DX: Unspecified osteoarthritis, unspecified site: M19.90

## 2012-11-12 HISTORY — PX: ORIF ANKLE FRACTURE: SHX5408

## 2012-11-12 LAB — GLUCOSE, CAPILLARY: Glucose-Capillary: 129 mg/dL — ABNORMAL HIGH (ref 70–99)

## 2012-11-12 SURGERY — OPEN REDUCTION INTERNAL FIXATION (ORIF) ANKLE FRACTURE
Anesthesia: General | Site: Ankle | Laterality: Right | Wound class: Clean

## 2012-11-12 MED ORDER — ROCURONIUM BROMIDE 100 MG/10ML IV SOLN
INTRAVENOUS | Status: DC | PRN
  Administered 2012-11-12: 40 mg via INTRAVENOUS

## 2012-11-12 MED ORDER — GLYCOPYRROLATE 0.2 MG/ML IJ SOLN
INTRAMUSCULAR | Status: DC | PRN
  Administered 2012-11-12: .6 mg via INTRAVENOUS

## 2012-11-12 MED ORDER — SODIUM CHLORIDE 0.9 % IV SOLN: INTRAVENOUS | Status: DC

## 2012-11-12 MED ORDER — LACTATED RINGERS IV SOLN
INTRAVENOUS | Status: DC | PRN
  Administered 2012-11-12 (×3): via INTRAVENOUS

## 2012-11-12 MED ORDER — PROPOFOL 10 MG/ML IV BOLUS
INTRAVENOUS | Status: DC | PRN
  Administered 2012-11-12: 200 mg via INTRAVENOUS

## 2012-11-12 MED ORDER — HYDROCODONE-ACETAMINOPHEN 5-325 MG PO TABS
ORAL_TABLET | ORAL | Status: AC
  Filled 2012-11-12: qty 1

## 2012-11-12 MED ORDER — SENNA 8.6 MG PO TABS
1.0000 | ORAL_TABLET | Freq: Two times a day (BID) | ORAL | Status: DC
  Administered 2012-11-12 – 2012-11-17 (×10): 8.6 mg via ORAL
  Filled 2012-11-12 (×11): qty 1

## 2012-11-12 MED ORDER — DIPHENHYDRAMINE HCL 12.5 MG/5ML PO ELIX: 25.0000 mg | ORAL_SOLUTION | ORAL | Status: DC | PRN

## 2012-11-12 MED ORDER — ONDANSETRON HCL 4 MG/2ML IJ SOLN
INTRAMUSCULAR | Status: DC | PRN
  Administered 2012-11-12: 4 mg via INTRAVENOUS

## 2012-11-12 MED ORDER — MAGNESIUM CITRATE PO SOLN
1.0000 | Freq: Once | ORAL | Status: AC | PRN
  Filled 2012-11-12: qty 296

## 2012-11-12 MED ORDER — LACTATED RINGERS IV SOLN
INTRAVENOUS | Status: DC
  Administered 2012-11-12: 10:00:00 via INTRAVENOUS

## 2012-11-12 MED ORDER — SORBITOL 70 % SOLN: 30.0000 mL | Freq: Every day | Status: DC | PRN

## 2012-11-12 MED ORDER — METOCLOPRAMIDE HCL 10 MG PO TABS: 5.0000 mg | ORAL_TABLET | Freq: Three times a day (TID) | ORAL | Status: DC | PRN

## 2012-11-12 MED ORDER — CLINDAMYCIN PHOSPHATE 600 MG/50ML IV SOLN
600.0000 mg | Freq: Four times a day (QID) | INTRAVENOUS | Status: AC
  Administered 2012-11-12 – 2012-11-13 (×3): 600 mg via INTRAVENOUS
  Filled 2012-11-12 (×3): qty 50

## 2012-11-12 MED ORDER — ATENOLOL 50 MG PO TABS
50.0000 mg | ORAL_TABLET | Freq: Once | ORAL | Status: AC
  Administered 2012-11-12: 50 mg via ORAL
  Filled 2012-11-12: qty 1

## 2012-11-12 MED ORDER — PNEUMOCOCCAL VAC POLYVALENT 25 MCG/0.5ML IJ INJ
0.5000 mL | INJECTION | INTRAMUSCULAR | Status: AC
  Filled 2012-11-12: qty 0.5

## 2012-11-12 MED ORDER — ONDANSETRON HCL 4 MG/2ML IJ SOLN: 4.0000 mg | Freq: Four times a day (QID) | INTRAMUSCULAR | Status: DC | PRN

## 2012-11-12 MED ORDER — ONDANSETRON HCL 4 MG PO TABS: 4.0000 mg | ORAL_TABLET | Freq: Four times a day (QID) | ORAL | Status: DC | PRN

## 2012-11-12 MED ORDER — FENTANYL CITRATE 0.05 MG/ML IJ SOLN
INTRAMUSCULAR | Status: DC | PRN
  Administered 2012-11-12: 100 ug via INTRAVENOUS
  Administered 2012-11-12: 150 ug via INTRAVENOUS
  Administered 2012-11-12: 50 ug via INTRAVENOUS
  Administered 2012-11-12 (×2): 100 ug via INTRAVENOUS

## 2012-11-12 MED ORDER — CALCIUM CARBONATE 1250 (500 CA) MG PO TABS
1.0000 | ORAL_TABLET | Freq: Every day | ORAL | Status: DC
  Administered 2012-11-13 – 2012-11-17 (×5): 500 mg via ORAL
  Filled 2012-11-12 (×6): qty 1

## 2012-11-12 MED ORDER — NEOSTIGMINE METHYLSULFATE 1 MG/ML IJ SOLN
INTRAMUSCULAR | Status: DC | PRN
  Administered 2012-11-12: 5 mg via INTRAVENOUS

## 2012-11-12 MED ORDER — 0.9 % SODIUM CHLORIDE (POUR BTL) OPTIME
TOPICAL | Status: DC | PRN
  Administered 2012-11-12: 2000 mL

## 2012-11-12 MED ORDER — MIDAZOLAM HCL 5 MG/5ML IJ SOLN
INTRAMUSCULAR | Status: DC | PRN
  Administered 2012-11-12: 2 mg via INTRAVENOUS

## 2012-11-12 MED ORDER — LIDOCAINE HCL (CARDIAC) 20 MG/ML IV SOLN
INTRAVENOUS | Status: DC | PRN
  Administered 2012-11-12: 100 mg via INTRAVENOUS

## 2012-11-12 MED ORDER — BUPIVACAINE HCL (PF) 0.25 % IJ SOLN
INTRAMUSCULAR | Status: AC
  Filled 2012-11-12: qty 30

## 2012-11-12 MED ORDER — HYDROCODONE-ACETAMINOPHEN 5-325 MG PO TABS
1.0000 | ORAL_TABLET | ORAL | Status: DC | PRN
  Administered 2012-11-12: 1 via ORAL

## 2012-11-12 MED ORDER — METOCLOPRAMIDE HCL 5 MG/ML IJ SOLN: 5.0000 mg | Freq: Three times a day (TID) | INTRAMUSCULAR | Status: DC | PRN

## 2012-11-12 MED ORDER — CLINDAMYCIN PHOSPHATE 900 MG/50ML IV SOLN
900.0000 mg | INTRAVENOUS | Status: DC
  Filled 2012-11-12: qty 50

## 2012-11-12 MED ORDER — ASPIRIN EC 325 MG PO TBEC
325.0000 mg | DELAYED_RELEASE_TABLET | Freq: Two times a day (BID) | ORAL | Status: DC
  Administered 2012-11-12 – 2012-11-17 (×10): 325 mg via ORAL
  Filled 2012-11-12 (×12): qty 1

## 2012-11-12 MED ORDER — BUPIVACAINE HCL (PF) 0.25 % IJ SOLN
INTRAMUSCULAR | Status: DC | PRN
  Administered 2012-11-12: 30 mL

## 2012-11-12 MED ORDER — DIAZEPAM 5 MG PO TABS
5.0000 mg | ORAL_TABLET | Freq: Four times a day (QID) | ORAL | Status: DC | PRN
  Administered 2012-11-13 – 2012-11-15 (×5): 5 mg via ORAL
  Filled 2012-11-12 (×7): qty 1

## 2012-11-12 MED ORDER — OXYCODONE HCL 5 MG PO TABS
5.0000 mg | ORAL_TABLET | ORAL | Status: DC | PRN
  Administered 2012-11-12 – 2012-11-13 (×3): 10 mg via ORAL
  Filled 2012-11-12 (×3): qty 2

## 2012-11-12 MED ORDER — POLYETHYLENE GLYCOL 3350 17 G PO PACK
17.0000 g | PACK | Freq: Every day | ORAL | Status: DC | PRN
  Administered 2012-11-13: 17 g via ORAL

## 2012-11-12 MED ORDER — MORPHINE SULFATE 2 MG/ML IJ SOLN
1.0000 mg | INTRAMUSCULAR | Status: DC | PRN
  Administered 2012-11-12 – 2012-11-13 (×4): 1 mg via INTRAVENOUS
  Filled 2012-11-12 (×4): qty 1

## 2012-11-12 MED ORDER — VITAMIN D (ERGOCALCIFEROL) 1.25 MG (50000 UNIT) PO CAPS
50000.0000 [IU] | ORAL_CAPSULE | ORAL | Status: DC
  Administered 2012-11-13: 50000 [IU] via ORAL
  Filled 2012-11-12: qty 1

## 2012-11-12 SURGICAL SUPPLY — 76 items
BANDAGE ELASTIC 4 VELCRO ST LF (GAUZE/BANDAGES/DRESSINGS) ×2 IMPLANT
BANDAGE ELASTIC 6 VELCRO ST LF (GAUZE/BANDAGES/DRESSINGS) ×2 IMPLANT
BANDAGE ESMARK 6X9 LF (GAUZE/BANDAGES/DRESSINGS) IMPLANT
BIT DRILL 2.7 QC CANN 155 (BIT) ×2 IMPLANT
BIT DRILL 3.5 QC 155 (BIT) ×2 IMPLANT
BIT DRILL QC 2.7 6.3IN  SHORT (BIT) ×1
BIT DRILL QC 2.7 6.3IN SHORT (BIT) ×1 IMPLANT
BNDG COHESIVE 4X5 TAN STRL (GAUZE/BANDAGES/DRESSINGS) ×2 IMPLANT
BNDG ESMARK 6X9 LF (GAUZE/BANDAGES/DRESSINGS)
BNDG GAUZE STRTCH 6 (GAUZE/BANDAGES/DRESSINGS) IMPLANT
CLOTH BEACON ORANGE TIMEOUT ST (SAFETY) ×2 IMPLANT
COVER SURGICAL LIGHT HANDLE (MISCELLANEOUS) ×2 IMPLANT
CUFF TOURNIQUET SINGLE 34IN LL (TOURNIQUET CUFF) ×2 IMPLANT
CUFF TOURNIQUET SINGLE 44IN (TOURNIQUET CUFF) IMPLANT
DRAPE C-ARM 42X72 X-RAY (DRAPES) ×2 IMPLANT
DRAPE C-ARM MINI 42X72 WSTRAPS (DRAPES) IMPLANT
DRAPE C-ARMOR (DRAPES) ×2 IMPLANT
DRAPE INCISE IOBAN 66X45 STRL (DRAPES) ×2 IMPLANT
DRAPE PROXIMA HALF (DRAPES) IMPLANT
DRAPE U-SHAPE 47X51 STRL (DRAPES) ×2 IMPLANT
DRSG ADAPTIC 3X8 NADH LF (GAUZE/BANDAGES/DRESSINGS) IMPLANT
DRSG PAD ABDOMINAL 8X10 ST (GAUZE/BANDAGES/DRESSINGS) ×2 IMPLANT
DURAPREP 26ML APPLICATOR (WOUND CARE) ×2 IMPLANT
ELECT REM PT RETURN 9FT ADLT (ELECTROSURGICAL) ×2
ELECTRODE REM PT RTRN 9FT ADLT (ELECTROSURGICAL) ×1 IMPLANT
GAUZE XEROFORM 5X9 LF (GAUZE/BANDAGES/DRESSINGS) ×2 IMPLANT
GLOVE BIOGEL PI IND STRL 6.5 (GLOVE) ×1 IMPLANT
GLOVE BIOGEL PI IND STRL 9 (GLOVE) IMPLANT
GLOVE BIOGEL PI INDICATOR 6.5 (GLOVE) ×1
GLOVE BIOGEL PI INDICATOR 9 (GLOVE)
GLOVE BIOGEL PI ORTHO PRO 7.5 (GLOVE) ×3
GLOVE ORTHO TXT STRL SZ7.5 (GLOVE) ×4 IMPLANT
GLOVE PI ORTHO PRO STRL 7.5 (GLOVE) ×3 IMPLANT
GLOVE SURG ORTHO 9.0 STRL STRW (GLOVE) IMPLANT
GOWN PREVENTION PLUS XLARGE (GOWN DISPOSABLE) ×2 IMPLANT
GOWN SRG XL XLNG 56XLVL 4 (GOWN DISPOSABLE) ×2 IMPLANT
GOWN STRL NON-REIN XL XLG LVL4 (GOWN DISPOSABLE) ×2
GUIDE PIN 1.3 (Pin) ×4 IMPLANT
KIT BASIN OR (CUSTOM PROCEDURE TRAY) ×2 IMPLANT
KIT ROOM TURNOVER OR (KITS) ×2 IMPLANT
MANIFOLD NEPTUNE II (INSTRUMENTS) ×2 IMPLANT
NEEDLE HYPO 25GX1X1/2 BEV (NEEDLE) ×2 IMPLANT
NS IRRIG 1000ML POUR BTL (IV SOLUTION) ×2 IMPLANT
PACK ORTHO EXTREMITY (CUSTOM PROCEDURE TRAY) ×2 IMPLANT
PAD ARMBOARD 7.5X6 YLW CONV (MISCELLANEOUS) ×6 IMPLANT
PAD CAST 4YDX4 CTTN HI CHSV (CAST SUPPLIES) ×2 IMPLANT
PADDING CAST COTTON 4X4 STRL (CAST SUPPLIES) ×2
PADDING CAST COTTON 6X4 STRL (CAST SUPPLIES) IMPLANT
PIN GUIDE 1.3 (Pin) ×2 IMPLANT
PLATE BONE 7-HOLE LOCK 86MM (Plate) ×2 IMPLANT
SCREW CANN 4.0X48 (Screw) ×2 IMPLANT
SCREW CANN 6XFT 40X4X2.7X (Screw) ×1 IMPLANT
SCREW CANNULATED 4.0X40 (Screw) ×1 IMPLANT
SCREW CANNULATED 4.1X40 (Screw) ×2 IMPLANT
SCREW FULL THREAD 4.0X45 (Screw) ×2 IMPLANT
SCREW NL 3.5X14 (Screw) ×6 IMPLANT
SCREW NON LOCK 3.5X12 (Screw) ×2 IMPLANT
SCREW NONLOCK 3.5X18 (Screw) ×2 IMPLANT
SPLINT PLASTER CAST XFAST 5X30 (CAST SUPPLIES) ×1 IMPLANT
SPLINT PLASTER XFAST SET 5X30 (CAST SUPPLIES) ×1
SPONGE GAUZE 4X4 12PLY (GAUZE/BANDAGES/DRESSINGS) ×2 IMPLANT
SPONGE LAP 18X18 X RAY DECT (DISPOSABLE) ×2 IMPLANT
STAPLER VISISTAT 35W (STAPLE) IMPLANT
SUCTION FRAZIER TIP 10 FR DISP (SUCTIONS) ×2 IMPLANT
SUT ETHILON 2 0 PSLX (SUTURE) IMPLANT
SUT ETHILON 3 0 PS 1 (SUTURE) ×6 IMPLANT
SUT VIC AB 0 CT1 27 (SUTURE) ×1
SUT VIC AB 0 CT1 27XBRD ANBCTR (SUTURE) ×1 IMPLANT
SUT VIC AB 2-0 CT1 27 (SUTURE) ×1
SUT VIC AB 2-0 CT1 TAPERPNT 27 (SUTURE) ×1 IMPLANT
SUT VIC AB 2-0 CTB1 (SUTURE) IMPLANT
SYR CONTROL 10ML LL (SYRINGE) ×2 IMPLANT
TOWEL OR 17X24 6PK STRL BLUE (TOWEL DISPOSABLE) ×2 IMPLANT
TOWEL OR 17X26 10 PK STRL BLUE (TOWEL DISPOSABLE) ×4 IMPLANT
TUBE CONNECTING 12X1/4 (SUCTIONS) ×2 IMPLANT
WATER STERILE IRR 1000ML POUR (IV SOLUTION) ×2 IMPLANT

## 2012-11-12 NOTE — Brief Op Note (Signed)
Brief Op Note  Date of Surgery: 11/12/2012  Preoperative Diagnosis: Right ankle fracture  Postoperative Diagnosis: same  Procedure: Procedure(s): OPEN REDUCTION INTERNAL FIXATION (ORIF) ANKLE FRACTURE  Implants: Katrinka Blazing and Nephew  Surgeons: Surgeon(s): Seraphina Mitchner Glee Arvin, MD  Anesthesia: General  Drains: none  Estimated Blood Loss: minimal  Complications: None  Condition to PACU: Stable  Windie Marasco Glee Arvin, MD [facility] 11/12/2012 2:49 PM

## 2012-11-12 NOTE — Transfer of Care (Signed)
Immediate Anesthesia Transfer of Care Note  Patient: Leah Norton  Procedure(s) Performed: Procedure(s) with comments: OPEN REDUCTION INTERNAL FIXATION (ORIF) ANKLE FRACTURE (Right) - Open reduction internal fixation ankle fracture, trimalleolar  Patient Location: PACU  Anesthesia Type:General  Level of Consciousness: awake, sedated and patient cooperative  Airway & Oxygen Therapy: Patient Spontanous Breathing and Patient connected to face mask oxygen  Post-op Assessment: Report given to PACU RN, Post -op Vital signs reviewed and stable and Patient moving all extremities  Post vital signs: Reviewed and stable  Complications: No apparent anesthesia complications

## 2012-11-12 NOTE — Anesthesia Preprocedure Evaluation (Addendum)
Anesthesia Evaluation  Patient identified by MRN, date of birth, ID band Patient awake    Reviewed: Allergy & Precautions  Airway Mallampati: II      Dental  (+) Teeth Intact   Pulmonary Current Smoker,  breath sounds clear to auscultation  Pulmonary exam normal       Cardiovascular hypertension, Rhythm:Regular     Neuro/Psych    GI/Hepatic GERD-  ,  Endo/Other  diabetes, Type 1  Renal/GU      Musculoskeletal   Abdominal Normal abdominal exam  (+)   Peds  Hematology   Anesthesia Other Findings   Reproductive/Obstetrics                          Anesthesia Physical Anesthesia Plan  ASA: III  Anesthesia Plan: General   Post-op Pain Management:    Induction: Intravenous  Airway Management Planned: Oral ETT  Additional Equipment:   Intra-op Plan:   Post-operative Plan: Extubation in OR  Informed Consent:   Dental advisory given  Plan Discussed with: CRNA, Anesthesiologist and Surgeon  Anesthesia Plan Comments:        Anesthesia Quick Evaluation

## 2012-11-12 NOTE — Anesthesia Postprocedure Evaluation (Signed)
  Anesthesia Post-op Note  Patient: Leah Norton  Procedure(s) Performed: Procedure(s) with comments: OPEN REDUCTION INTERNAL FIXATION (ORIF) ANKLE FRACTURE (Right) - Open reduction internal fixation ankle fracture, trimalleolar  Patient Location: PACU  Anesthesia Type:General  Level of Consciousness: awake  Airway and Oxygen Therapy: Patient Spontanous Breathing  Post-op Pain: mild  Post-op Assessment: Post-op Vital signs reviewed  Post-op Vital Signs: Reviewed  Complications: No apparent anesthesia complications
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
## 2012-11-12 NOTE — H&P (Signed)
H&P update  The surgical history has been reviewed and remains accurate without interval change.  The patient was re-examined and patient's physiologic condition has not changed significantly in the last 30 days. The condition still exists that makes this procedure necessary. The treatment plan remains the same, without new options for care.  No new pharmacological allergies or types of therapy has been initiated that would change the plan or the appropriateness of the plan.  The patient and/or family understand the potential benefits and risks.  N. Michael Xu, MD 11/12/2012 8:15 AM   

## 2012-11-12 NOTE — Anesthesia Procedure Notes (Signed)
Procedure Name: Intubation Date/Time: 11/12/2012 12:11 PM Performed by: Coralee Rud Pre-anesthesia Checklist: Patient identified, Emergency Drugs available, Suction available, Patient being monitored and Timeout performed Patient Re-evaluated:Patient Re-evaluated prior to inductionOxygen Delivery Method: Circle system utilized Preoxygenation: Pre-oxygenation with 100% oxygen Intubation Type: IV induction Ventilation: Mask ventilation without difficulty Tube type: Oral Tube size: 7.5 mm Number of attempts: 1 Airway Equipment and Method: Stylet and LTA kit utilized Placement Confirmation: ETT inserted through vocal cords under direct vision,  positive ETCO2 and breath sounds checked- equal and bilateral Secured at: 22 cm Dental Injury: Teeth and Oropharynx as per pre-operative assessment

## 2012-11-13 ENCOUNTER — Encounter (HOSPITAL_COMMUNITY): Payer: Self-pay | Admitting: General Practice

## 2012-11-13 LAB — GLUCOSE, CAPILLARY
Glucose-Capillary: 169 mg/dL — ABNORMAL HIGH (ref 70–99)
Glucose-Capillary: 186 mg/dL — ABNORMAL HIGH (ref 70–99)

## 2012-11-13 MED ORDER — WHITE PETROLATUM GEL
Status: AC
  Administered 2012-11-13: 04:00:00
  Filled 2012-11-13: qty 5

## 2012-11-13 MED ORDER — ASPIRIN 325 MG PO TBEC: 325.0000 mg | DELAYED_RELEASE_TABLET | Freq: Two times a day (BID) | ORAL | Status: AC

## 2012-11-13 MED ORDER — PROMETHAZINE HCL 25 MG PO TABS: 25.0000 mg | ORAL_TABLET | Freq: Four times a day (QID) | ORAL | Status: DC | PRN

## 2012-11-13 MED ORDER — WHITE PETROLATUM GEL
Status: AC
  Filled 2012-11-13: qty 5

## 2012-11-13 MED ORDER — OXYCODONE-ACETAMINOPHEN 5-325 MG PO TABS
1.0000 | ORAL_TABLET | ORAL | Status: DC | PRN
  Administered 2012-11-13 – 2012-11-17 (×19): 1 via ORAL
  Filled 2012-11-13 (×8): qty 1
  Filled 2012-11-13: qty 2
  Filled 2012-11-13 (×11): qty 1

## 2012-11-13 MED ORDER — OXYCODONE-ACETAMINOPHEN 5-325 MG PO TABS: 1.0000 | ORAL_TABLET | ORAL | Status: DC | PRN

## 2012-11-13 NOTE — Progress Notes (Signed)
Subjective:  Patient reports pain as moderate.  No events.  Pleasant  Objective:   VITALS:   Filed Vitals:   11/12/12 1645 11/12/12 1700 11/12/12 2110 11/13/12 0622  BP: 167/92 164/83 153/80 166/74  Pulse: 73 78 85 84  Temp: 97.6 F (36.4 C) 98.1 F (36.7 C) 100.1 F (37.8 C) 99.2 F (37.3 C)  TempSrc:      Resp: 19 17 16 16   Height:      Weight:      SpO2: 95% 93% 88% 91%    Neurologically intact Neurovascular intact Sensation intact distally Intact pulses distally Incision: dressing C/D/I and no drainage No cellulitis present Compartment soft   Lab Results  Component Value Date   WBC 8.4 11/06/2012   HGB 12.1 11/06/2012   HCT 36.0 11/06/2012   MCV 88.5 11/06/2012   PLT 354 11/06/2012     Assessment/Plan: 1 Day Post-Op   Active Problems:   * No active hospital problems. *   Advance diet Up with therapy DVT ppx Pain control Discharge planning - likely needs inpatient rehab vs. SNF per PT/OT   Cheral Almas 11/13/2012, 7:58 AM [phone]
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
## 2012-11-13 NOTE — Progress Notes (Signed)
UR COMPLETED  

## 2012-11-13 NOTE — Progress Notes (Signed)
Clinical Social Work Department BRIEF PSYCHOSOCIAL ASSESSMENT 11/13/2012  Patient:  Leah Norton, Leah Norton     Account Number:  [id number]     Admit date:  11/12/2012  Clinical Social Worker:  Lourdes Sledge  Date/Time:  11/13/2012 04:11 PM  Referred by:  Physician  Date Referred:  11/13/2012 Referred for  SNF Placement   Other Referral:   Interview type:  Patient Other interview type:    PSYCHOSOCIAL DATA Living Status:  ALONE Admitted from facility:   Level of care:   Primary support name:  Lacrecia Delval Primary support relationship to patient:  SIBLING Degree of support available:   Pt states she has support from family and friends however none are able to provide 24/hr care.    CURRENT CONCERNS Current Concerns  Post-Acute Placement   Other Concerns:    SOCIAL WORK ASSESSMENT / PLAN Assisting CSW received a referral for SNF placement.    CSW visited pt room with student intern. CSW introduced herself and role and informed pt of PT recommendations. Pt confirmed she does not have 24/hr support at home and feels that she needs ST SNF placement at discharge. Pt stated she prefers placement at [facility], [facility] or [place]. CSW provided education to pt regarding pt insurance giving authorization for SNF placement, pt understanding.    Pt consented to do a SNF search for [facility].   Assessment/plan status:  Psychosocial Support/Ongoing Assessment of Needs Other assessment/ plan:   Information/referral to community resources:   CSW provided pt with a SNF list and CSW contact information.    PATIENT'S/FAMILY'S RESPONSE TO PLAN OF CARE: Pt laying on the recliner, presented alert and oriented. Pt agreeable to SNF placement.    Unit CSW to follow for placement.       Theresia Bough, MSW, LCSW [phone]
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
## 2012-11-13 NOTE — Progress Notes (Signed)
Clinical Social Work Department CLINICAL SOCIAL WORK PLACEMENT NOTE 11/13/2012  Patient:  Leah Norton, Leah Norton  Account Number:  [id number] Admit date:  11/12/2012  Clinical Social Worker:  Theresia Bough, Theresia Majors  Date/time:  11/13/2012 04:17 PM  Clinical Social Work is seeking post-discharge placement for this patient at the following level of care:   SKILLED NURSING   (*CSW will update this form in Epic as items are completed)   11/13/2012  Patient/family provided with [facility] Department of Clinical Social Work's list of facilities offering this level of care within the geographic area requested by the patient (or if unable, by the patient's family).  11/13/2012  Patient/family informed of their freedom to choose among providers that offer the needed level of care, that participate in Medicare, Medicaid or managed care program needed by the patient, have an available bed and are willing to accept the patient.  11/13/2012  Patient/family informed of [facility]' ownership interest in [facility], as well as of the fact that they are under no obligation to receive care at this facility.  PASARR submitted to EDS on 11/13/2012 PASARR number received from EDS on   FL2 transmitted to all facilities in geographic area requested by pt/family on  11/13/2012 FL2 transmitted to all facilities within larger geographic area on   Patient informed that his/her managed care company has contracts with or will negotiate with  certain facilities, including the following:     Patient/family informed of bed offers received:   Patient chooses bed at  Physician recommends and patient chooses bed at    Patient to be transferred to  on   Patient to be transferred to facility by   The following physician request were entered in Epic:   Additional Comments:  Theresia Bough, MSW, LCSW [phone] ]

## 2012-11-13 NOTE — Progress Notes (Signed)
Patient c/o splint edges poking her upper posterior calf. Patient had unwrapped ace dsg herself in attempt to get relief. Padded splint edges with some gauzes to help relieve patient discomfort. Leg elevated, iced and patient instructed to keep leg elevated to reduce swelling in feet and pain. Will continue to monitor.

## 2012-11-14 LAB — GLUCOSE, CAPILLARY: Glucose-Capillary: 233 mg/dL — ABNORMAL HIGH (ref 70–99)

## 2012-11-14 MED ORDER — SIMVASTATIN 20 MG PO TABS
20.0000 mg | ORAL_TABLET | Freq: Every day | ORAL | Status: DC
  Administered 2012-11-14 – 2012-11-16 (×3): 20 mg via ORAL
  Filled 2012-11-14 (×4): qty 1

## 2012-11-14 MED ORDER — INSULIN ASPART 100 UNIT/ML ~~LOC~~ SOLN: 0.0000 [IU] | Freq: Three times a day (TID) | SUBCUTANEOUS | Status: DC

## 2012-11-14 MED ORDER — LINAGLIPTIN 5 MG PO TABS
5.0000 mg | ORAL_TABLET | Freq: Every day | ORAL | Status: DC
  Administered 2012-11-14 – 2012-11-17 (×4): 5 mg via ORAL
  Filled 2012-11-14 (×4): qty 1

## 2012-11-14 MED ORDER — ISOSORBIDE MONONITRATE ER 30 MG PO TB24
30.0000 mg | ORAL_TABLET | Freq: Every day | ORAL | Status: DC
  Administered 2012-11-14 – 2012-11-17 (×4): 30 mg via ORAL
  Filled 2012-11-14 (×4): qty 1

## 2012-11-14 MED ORDER — LOSARTAN POTASSIUM 50 MG PO TABS
50.0000 mg | ORAL_TABLET | Freq: Every day | ORAL | Status: DC
  Administered 2012-11-14 – 2012-11-17 (×4): 50 mg via ORAL
  Filled 2012-11-14 (×4): qty 1

## 2012-11-14 MED ORDER — METFORMIN HCL 500 MG PO TABS
1000.0000 mg | ORAL_TABLET | Freq: Two times a day (BID) | ORAL | Status: DC
  Administered 2012-11-14 – 2012-11-17 (×7): 1000 mg via ORAL
  Filled 2012-11-14 (×8): qty 2

## 2012-11-14 MED ORDER — ATENOLOL 50 MG PO TABS
50.0000 mg | ORAL_TABLET | Freq: Every day | ORAL | Status: DC
  Administered 2012-11-14 – 2012-11-17 (×4): 50 mg via ORAL
  Filled 2012-11-14 (×4): qty 1

## 2012-11-14 MED ORDER — BUPROPION HCL ER (SR) 150 MG PO TB12
150.0000 mg | ORAL_TABLET | Freq: Two times a day (BID) | ORAL | Status: DC
  Administered 2012-11-14 – 2012-11-17 (×6): 150 mg via ORAL
  Filled 2012-11-14 (×7): qty 1

## 2012-11-14 NOTE — Progress Notes (Signed)
Inpatient Diabetes Program Recommendations  AACE/ADA: New Consensus Statement on Inpatient Glycemic Control (2013)  Target Ranges:  Prepandial:   less than 140 mg/dL      Peak postprandial:   less than 180 mg/dL (1-2 hours)      Critically ill patients:  140 - 180 mg/dL   Reason for Assessmentt: Patient has diabetes.  No order for CBG's or correction scale.  Note: Patient has diabetes. Please add diagnosis to the Hospital Problem List.   Please complete the Glycemic Control Order Set and order CBG's a minimum of ac & HS.  Consider starting correction scale.  (The American Diabetes Association recommends that patients with diabetes have CBG's checked at least four times a day while in the hospital setting.  Please order CBG's ac & hs while eating or if patient becomes NPO, every 4 hours.)   Also, please consider ordering a Hbg A1C.  No value known since 2011.  Thank you.  Vendetta Pittinger S. Elsie Lincoln, RN, CNS, CDE Inpatient Diabetes Program, team pager [phone]

## 2012-11-14 NOTE — Progress Notes (Signed)
Subjective:  Patient reports pain as mild.  No events  Objective:   VITALS:   Filed Vitals:   11/13/12 2007 11/14/12 0511 11/14/12 1300 11/14/12 1737  BP: 169/75 145/70 148/83 137/71  Pulse: 96 84 102 99  Temp: 98.8 F (37.1 C) 98.7 F (37.1 C) 98.7 F (37.1 C)   TempSrc: Oral Oral    Resp: 18 18 16    Height:      Weight:      SpO2: 99% 97% 98%     Neurologically intact Neurovascular intact Sensation intact distally Intact pulses distally Incision: dressing C/D/I and no drainage No cellulitis present   Lab Results  Component Value Date   WBC 8.4 11/06/2012   HGB 12.1 11/06/2012   HCT 36.0 11/06/2012   MCV 88.5 11/06/2012   PLT 354 11/06/2012     Assessment/Plan: 2 Days Post-Op   Problem List Items Addressed This Visit   NEOPLASM, SKIN, UNCERTAIN BEHAVIOR - Primary   Relevant Orders      Call MD / Call 911      Diet - low sodium heart healthy      Constipation Prevention      Increase activity slowly as tolerated   DIABETES MELLITUS, TYPE II   Relevant Medications      aspirin EC tablet 325 mg      aspirin EC tablet      losartan (COZAAR) tablet 50 mg      simvastatin (ZOCOR) tablet 20 mg      linagliptin (TRADJENTA) tablet 5 mg      metFORMIN (GLUCOPHAGE) tablet 1,000 mg      insulin aspart (novoLOG) injection 0-9 Units   Other Relevant Orders      Call MD / Call 911      Diet - low sodium heart healthy      Constipation Prevention      Increase activity slowly as tolerated   HYPERLIPIDEMIA   Relevant Medications      atenolol (TENORMIN) tablet 50 mg (Completed)      aspirin EC tablet 325 mg      aspirin EC tablet      atenolol (TENORMIN) tablet 50 mg      isosorbide mononitrate (IMDUR) 24 hr tablet 30 mg      losartan (COZAAR) tablet 50 mg      simvastatin (ZOCOR) tablet 20 mg   Other Relevant Orders      Call MD / Call 911      Diet - low sodium heart healthy      Constipation Prevention      Increase activity slowly as tolerated   ANEMIA   Relevant Orders      Call MD / Call 911      Diet - low sodium heart healthy      Constipation Prevention      Increase activity slowly as tolerated   TOBACCO ABUSE   Relevant Orders      Call MD / Call 911      Diet - low sodium heart healthy      Constipation Prevention      Increase activity slowly as tolerated   HYPERTENSION   Relevant Medications      atenolol (TENORMIN) tablet 50 mg (Completed)      aspirin EC tablet 325 mg      aspirin EC tablet      atenolol (TENORMIN) tablet 50 mg      isosorbide mononitrate (IMDUR) 24 hr tablet 30  mg      losartan (COZAAR) tablet 50 mg      simvastatin (ZOCOR) tablet 20 mg   Other Relevant Orders      Call MD / Call 911      Diet - low sodium heart healthy      Constipation Prevention      Increase activity slowly as tolerated   ATRIAL ENLARGEMENT, LEFT   Relevant Medications      atenolol (TENORMIN) tablet 50 mg (Completed)      aspirin EC tablet 325 mg      aspirin EC tablet      atenolol (TENORMIN) tablet 50 mg      isosorbide mononitrate (IMDUR) 24 hr tablet 30 mg      losartan (COZAAR) tablet 50 mg      simvastatin (ZOCOR) tablet 20 mg   Other Relevant Orders      Call MD / Call 911      Diet - low sodium heart healthy      Constipation Prevention      Increase activity slowly as tolerated   UPPER RESPIRATORY INFECTION, VIRAL   Relevant Medications      clindamycin (CLEOCIN) IVPB 900 mg (Completed)      clindamycin (CLEOCIN) IVPB 600 mg (Completed)   Other Relevant Orders      Call MD / Call 911      Diet - low sodium heart healthy      Constipation Prevention      Increase activity slowly as tolerated   RHINITIS, ALLERGIC NEC   Relevant Medications      diphenhydrAMINE (BENADRYL) 12.5 MG/5ML elixir 25 mg      promethazine (PHENERGAN)  tablet   Other Relevant Orders      Call MD / Call 911      Diet - low sodium heart healthy      Constipation Prevention      Increase activity slowly as tolerated   BRONCHITIS,  CHRONIC   Relevant Orders      Call MD / Call 911      Diet - low sodium heart healthy      Constipation Prevention      Increase activity slowly as tolerated   DENTAL PAIN   Relevant Orders      Call MD / Call 911      Diet - low sodium heart healthy      Constipation Prevention      Increase activity slowly as tolerated   CONSTIPATION   Relevant Orders      Call MD / Call 911      Diet - low sodium heart healthy      Constipation Prevention      Increase activity slowly as tolerated   HIP PAIN, RIGHT   Relevant Orders      Call MD / Call 911      Diet - low sodium heart healthy      Constipation Prevention      Increase activity slowly as tolerated   DEGENERATIVE DISC DISEASE, LUMBOSACRAL SPINE   Relevant Medications      oxyCODONE-acetaminophen (PERCOCET/ROXICET) 5-325 MG per tablet      morphine 2 MG/ML injection 1 mg      aspirin EC tablet 325 mg      oxyCODONE-acetaminophen (PERCOCET/ROXICET) 5-325 MG per tablet 1 tablet      aspirin EC tablet      oxyCODONE-acetaminophen (PERCOCET/ROXICET) 5-325 MG per tablet   Other Relevant  Orders      Call MD / Call 911      Diet - low sodium heart healthy      Constipation Prevention      Increase activity slowly as tolerated   LOW BACK PAIN SYNDROME   Relevant Medications      oxyCODONE-acetaminophen (PERCOCET/ROXICET) 5-325 MG per tablet      morphine 2 MG/ML injection 1 mg      aspirin EC tablet 325 mg      oxyCODONE-acetaminophen (PERCOCET/ROXICET) 5-325 MG per tablet 1 tablet      aspirin EC tablet      oxyCODONE-acetaminophen (PERCOCET/ROXICET) 5-325 MG per tablet   Other Relevant Orders      Call MD / Call 911      Diet - low sodium heart healthy      Constipation Prevention      Increase activity slowly as tolerated   GANGLION CYST, WRIST, LEFT   Relevant Orders      Call MD / Call 911      Diet - low sodium heart healthy      Constipation Prevention      Increase activity slowly as tolerated   HEEL PAIN,  RIGHT   Relevant Orders      Call MD / Call 911      Diet - low sodium heart healthy      Constipation Prevention      Increase activity slowly as tolerated   CARDIAC MURMUR   Relevant Orders      Call MD / Call 911      Diet - low sodium heart healthy      Constipation Prevention      Increase activity slowly as tolerated   COUGH DUE TO ACE INHIBITORS   Relevant Orders      Call MD / Call 911      Diet - low sodium heart healthy      Constipation Prevention      Increase activity slowly as tolerated   OTHER NONSPECIFIC ABNORMAL SERUM ENZYME LEVELS   Relevant Orders      Call MD / Call 911      Diet - low sodium heart healthy      Constipation Prevention      Increase activity slowly as tolerated   POSTMENOPAUSAL STATUS   Relevant Orders      Call MD / Call 911      Diet - low sodium heart healthy      Constipation Prevention      Increase activity slowly as tolerated   INSOMNIA, CHRONIC   Relevant Orders      Call MD / Call 911      Diet - low sodium heart healthy      Constipation Prevention      Increase activity slowly as tolerated   HEADACHE   Relevant Medications      oxyCODONE-acetaminophen (PERCOCET/ROXICET) 5-325 MG per tablet      atenolol (TENORMIN) tablet 50 mg (Completed)      morphine 2 MG/ML injection 1 mg      aspirin EC tablet 325 mg      oxyCODONE-acetaminophen (PERCOCET/ROXICET) 5-325 MG per tablet 1 tablet      aspirin EC tablet      oxyCODONE-acetaminophen (PERCOCET/ROXICET) 5-325 MG per tablet      atenolol (TENORMIN) tablet 50 mg      buPROPion (WELLBUTRIN SR) 12 hr tablet 150 mg   Other Relevant Orders  Call MD / Call 911      Diet - low sodium heart healthy      Constipation Prevention      Increase activity slowly as tolerated      Up with therapy Discharge to SNF DVT ppx Pain control Discharge planning   Cheral Almas 11/14/2012, 11:25 PM [phone]

## 2012-11-14 NOTE — Progress Notes (Signed)
Orthopedic Tech Progress Note Patient Details:  Leah Norton 1956/01/04 [id number]  Patient ID: [name], female   DOB: 01-21-1956, 57 y.o.   MRN: [id number] Trapeze bar patient helper  Nikki Dom 11/14/2012, 8:14 PM

## 2012-11-14 NOTE — Clinical Social Work Note (Signed)
Clinical Social Worker provided bed offers to patient.   Rozetta Nunnery MSW, [employer] [phone]
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
## 2012-11-15 LAB — GLUCOSE, CAPILLARY
Glucose-Capillary: 181 mg/dL — ABNORMAL HIGH (ref 70–99)
Glucose-Capillary: 188 mg/dL — ABNORMAL HIGH (ref 70–99)
Glucose-Capillary: 189 mg/dL — ABNORMAL HIGH (ref 70–99)

## 2012-11-15 NOTE — Progress Notes (Signed)
CSW met with pt / family to assist with d/c planning. Pt has chosen [facility] for [facility] [facility]. CSW spoke with facility Administrator this afternoon to see if pt could d/c to SNF today. CSW was told to call Admissions [facility] ) to see if this was possible. CSW has not been able to reach [facility] on her cell phone ( she's not at SNF today ). CSW will continue to attempt to make d/c arrangements with [facility]. Pt / nsg updated.  Cori Razor LCSW [phone]
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
## 2012-11-16 LAB — GLUCOSE, CAPILLARY: Glucose-Capillary: 132 mg/dL — ABNORMAL HIGH (ref 70–99)

## 2012-11-16 MED ORDER — WHITE PETROLATUM GEL
Status: AC
  Administered 2012-11-16: 09:00:00
  Filled 2012-11-16: qty 5

## 2012-11-16 NOTE — Progress Notes (Signed)
Subjective:  Patient reports pain as mild.  No events.  Frustrated she can't leave today.  Objective:   VITALS:   Filed Vitals:   11/15/12 1434 11/15/12 2100 11/16/12 0638 11/16/12 1353  BP: 122/55 134/71 132/70 116/70  Pulse: 85 92 93 85  Temp: 100.5 F (38.1 C) 99 F (37.2 C) 99.3 F (37.4 C) 99.4 F (37.4 C)  TempSrc:   Oral   Resp: 16 18 20 18   Height:      Weight:      SpO2: 97% 97% 96% 100%    Neurologically intact Neurovascular intact Sensation intact distally Intact pulses distally Incision: dressing C/D/I and no drainage No cellulitis present Compartment soft   Lab Results  Component Value Date   WBC 8.4 11/06/2012   HGB 12.1 11/06/2012   HCT 36.0 11/06/2012   MCV 88.5 11/06/2012   PLT 354 11/06/2012     Assessment/Plan: 4 Days Post-Op   Problem List Items Addressed This Visit   NEOPLASM, SKIN, UNCERTAIN BEHAVIOR   Relevant Orders      Call MD / Call 911      Diet - low sodium heart healthy      Constipation Prevention      Increase activity slowly as tolerated   DIABETES MELLITUS, TYPE II   Relevant Medications      aspirin EC tablet 325 mg      aspirin EC tablet      losartan (COZAAR) tablet 50 mg      simvastatin (ZOCOR) tablet 20 mg      linagliptin (TRADJENTA) tablet 5 mg      metFORMIN (GLUCOPHAGE) tablet 1,000 mg      insulin aspart (novoLOG) injection 0-9 Units   Other Relevant Orders      Call MD / Call 911      Diet - low sodium heart healthy      Constipation Prevention      Increase activity slowly as tolerated   HYPERLIPIDEMIA   Relevant Medications      atenolol (TENORMIN) tablet 50 mg (Completed)      aspirin EC tablet 325 mg      aspirin EC tablet      atenolol (TENORMIN) tablet 50 mg      isosorbide mononitrate (IMDUR) 24 hr tablet 30 mg      losartan (COZAAR) tablet 50 mg      simvastatin (ZOCOR) tablet 20 mg   Other Relevant Orders      Call MD / Call 911      Diet - low sodium heart healthy      Constipation  Prevention      Increase activity slowly as tolerated   ANEMIA   Relevant Orders      Call MD / Call 911      Diet - low sodium heart healthy      Constipation Prevention      Increase activity slowly as tolerated   TOBACCO ABUSE   Relevant Orders      Call MD / Call 911      Diet - low sodium heart healthy      Constipation Prevention      Increase activity slowly as tolerated   HYPERTENSION   Relevant Medications      atenolol (TENORMIN) tablet 50 mg (Completed)      aspirin EC tablet 325 mg      aspirin EC tablet      atenolol (TENORMIN) tablet 50 mg  isosorbide mononitrate (IMDUR) 24 hr tablet 30 mg      losartan (COZAAR) tablet 50 mg      simvastatin (ZOCOR) tablet 20 mg   Other Relevant Orders      Call MD / Call 911      Diet - low sodium heart healthy      Constipation Prevention      Increase activity slowly as tolerated   ATRIAL ENLARGEMENT, LEFT   Relevant Medications      atenolol (TENORMIN) tablet 50 mg (Completed)      aspirin EC tablet 325 mg      aspirin EC tablet      atenolol (TENORMIN) tablet 50 mg      isosorbide mononitrate (IMDUR) 24 hr tablet 30 mg      losartan (COZAAR) tablet 50 mg      simvastatin (ZOCOR) tablet 20 mg   Other Relevant Orders      Call MD / Call 911      Diet - low sodium heart healthy      Constipation Prevention      Increase activity slowly as tolerated   UPPER RESPIRATORY INFECTION, VIRAL   Relevant Medications      clindamycin (CLEOCIN) IVPB 900 mg (Completed)      clindamycin (CLEOCIN) IVPB 600 mg (Completed)   Other Relevant Orders      Call MD / Call 911      Diet - low sodium heart healthy      Constipation Prevention      Increase activity slowly as tolerated   RHINITIS, ALLERGIC NEC   Relevant Medications      diphenhydrAMINE (BENADRYL) 12.5 MG/5ML elixir 25 mg      promethazine (PHENERGAN)  tablet   Other Relevant Orders      Call MD / Call 911      Diet - low sodium heart healthy      Constipation  Prevention      Increase activity slowly as tolerated   BRONCHITIS, CHRONIC   Relevant Orders      Call MD / Call 911      Diet - low sodium heart healthy      Constipation Prevention      Increase activity slowly as tolerated   DENTAL PAIN   Relevant Orders      Call MD / Call 911      Diet - low sodium heart healthy      Constipation Prevention      Increase activity slowly as tolerated   CONSTIPATION   Relevant Orders      Call MD / Call 911      Diet - low sodium heart healthy      Constipation Prevention      Increase activity slowly as tolerated   HIP PAIN, RIGHT   Relevant Orders      Call MD / Call 911      Diet - low sodium heart healthy      Constipation Prevention      Increase activity slowly as tolerated   DEGENERATIVE DISC DISEASE, LUMBOSACRAL SPINE   Relevant Medications      oxyCODONE-acetaminophen (PERCOCET/ROXICET) 5-325 MG per tablet      morphine 2 MG/ML injection 1 mg      aspirin EC tablet 325 mg      oxyCODONE-acetaminophen (PERCOCET/ROXICET) 5-325 MG per tablet 1 tablet      aspirin EC tablet      oxyCODONE-acetaminophen (PERCOCET/ROXICET) 5-325  MG per tablet   Other Relevant Orders      Call MD / Call 911      Diet - low sodium heart healthy      Constipation Prevention      Increase activity slowly as tolerated   LOW BACK PAIN SYNDROME   Relevant Medications      oxyCODONE-acetaminophen (PERCOCET/ROXICET) 5-325 MG per tablet      morphine 2 MG/ML injection 1 mg      aspirin EC tablet 325 mg      oxyCODONE-acetaminophen (PERCOCET/ROXICET) 5-325 MG per tablet 1 tablet      aspirin EC tablet      oxyCODONE-acetaminophen (PERCOCET/ROXICET) 5-325 MG per tablet   Other Relevant Orders      Call MD / Call 911      Diet - low sodium heart healthy      Constipation Prevention      Increase activity slowly as tolerated   GANGLION CYST, WRIST, LEFT   Relevant Orders      Call MD / Call 911      Diet - low sodium heart healthy      Constipation  Prevention      Increase activity slowly as tolerated   HEEL PAIN, RIGHT   Relevant Orders      Call MD / Call 911      Diet - low sodium heart healthy      Constipation Prevention      Increase activity slowly as tolerated   CARDIAC MURMUR   Relevant Orders      Call MD / Call 911      Diet - low sodium heart healthy      Constipation Prevention      Increase activity slowly as tolerated   COUGH DUE TO ACE INHIBITORS   Relevant Orders      Call MD / Call 911      Diet - low sodium heart healthy      Constipation Prevention      Increase activity slowly as tolerated   OTHER NONSPECIFIC ABNORMAL SERUM ENZYME LEVELS   Relevant Orders      Call MD / Call 911      Diet - low sodium heart healthy      Constipation Prevention      Increase activity slowly as tolerated   POSTMENOPAUSAL STATUS   Relevant Orders      Call MD / Call 911      Diet - low sodium heart healthy      Constipation Prevention      Increase activity slowly as tolerated   INSOMNIA, CHRONIC   Relevant Orders      Call MD / Call 911      Diet - low sodium heart healthy      Constipation Prevention      Increase activity slowly as tolerated   HEADACHE   Relevant Medications      oxyCODONE-acetaminophen (PERCOCET/ROXICET) 5-325 MG per tablet      atenolol (TENORMIN) tablet 50 mg (Completed)      morphine 2 MG/ML injection 1 mg      aspirin EC tablet 325 mg      oxyCODONE-acetaminophen (PERCOCET/ROXICET) 5-325 MG per tablet 1 tablet      aspirin EC tablet      oxyCODONE-acetaminophen (PERCOCET/ROXICET) 5-325 MG per tablet      atenolol (TENORMIN) tablet 50 mg      buPROPion (WELLBUTRIN SR) 12 hr tablet 150  mg   Other Relevant Orders      Call MD / Call 911      Diet - low sodium heart healthy      Constipation Prevention      Increase activity slowly as tolerated    Other Visit Diagnoses   Ankle fracture, right, closed, initial encounter    -  Primary    Relevant Orders       Call MD / Call 911        Diet - low sodium heart healthy       Constipation Prevention       Increase activity slowly as tolerated       Advance diet Up with therapy Discharge to SNF DVT ppx - aspirin 325 BID Pain control Discharge planning   Cheral Almas 11/16/2012, 3:37 PM [phone]

## 2012-11-17 LAB — GLUCOSE, CAPILLARY
Glucose-Capillary: 118 mg/dL — ABNORMAL HIGH (ref 70–99)
Glucose-Capillary: 160 mg/dL — ABNORMAL HIGH (ref 70–99)
Glucose-Capillary: 167 mg/dL — ABNORMAL HIGH (ref 70–99)

## 2012-11-17 NOTE — Progress Notes (Addendum)
Physical Therapy Treatment Patient Details Name: KIT BRUBACHER MRN: [id number] DOB: 02/10/1956 Today's Date: 11/17/2012 Time:  -     PT Assessment / Plan / Recommendation  History of Present Illness     PT Comments   Pt unsure if she is going to rehab center or home. Session addressed stair amb incase she decides to D/C home. Pt Requires min (A) to manage RW and maintain balance on stairs; pt given handout for instructions. Will cont per POC to maximize functional mobility. If pt D/C home, she will need wheelchair, RW and 3 in 1.   Follow Up Recommendations        Does the patient have the potential to tolerate intense rehabilitation     Barriers to Discharge        Equipment Recommendations       Recommendations for Other Services    Frequency     Progress towards PT Goals    Plan      Precautions / Restrictions     Pertinent Vitals/Pain 2/10 RN provided medication to assist with pain control     Mobility            PT Diagnosis:    PT Problem List:   PT Treatment Interventions:     PT Goals (current goals can now be found in the care plan section)    Visit Information       Subjective Data      Cognition       Balance     End of Session     GP     Donell Sievert, [place] [phone] 11/17/2012, 2:32 PM
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
## 2012-11-18 NOTE — Clinical Social Work Placement (Addendum)
    Clinical Social Work Department CLINICAL SOCIAL WORK PLACEMENT NOTE 11/18/2012  Patient:  Leah Norton, Leah Norton  Account Number:  [id number] Admit date:  11/12/2012  Clinical Social Worker:  Theresia Bough, Theresia Majors  Date/time:  11/13/2012 04:17 PM  Clinical Social Work is seeking post-discharge placement for this patient at the following level of care:   SKILLED NURSING   (*CSW will update this form in Epic as items are completed)   11/13/2012  Patient/family provided with [facility] Department of Clinical Social Work's list of facilities offering this level of care within the geographic area requested by the patient (or if unable, by the patient's family).  11/13/2012  Patient/family informed of their freedom to choose among providers that offer the needed level of care, that participate in Medicare, Medicaid or managed care program needed by the patient, have an available bed and are willing to accept the patient.  11/13/2012  Patient/family informed of [facility]' ownership interest in [facility], as well as of the fact that they are under no obligation to receive care at this facility.  PASARR submitted to [employer] on 11/13/2012 PASARR number received from [employer] on   FL2 transmitted to all facilities in geographic area requested by pt/family on  11/13/2012 FL2 transmitted to all facilities within larger geographic area on   Patient informed that his/her managed care company has contracts with or will negotiate with  certain facilities, including the following:     Patient/family informed of bed offers received:  11/17/2012 Patient chooses bed at [facility] Physician recommends and patient chooses bed at    Patient to be transferred to [facility] on  11/17/2012 Patient to be transferred to facility by [facility] (with family)  The following physician request were entered in Epic:   Additional Comments: 11/17/12. DC today to SNF for short term  SNF.  [employer] gave authorization at almost 5pm. Patient stated she would return home today if [facility] Harvey was not obtained. Discussed wtih Jacquelynn Cree, RNCM who had planned to arrange [facility] and DME. Patient was pleased to go to SNF for placement.  No further CSW needs identified.  CSW signing off.

## 2013-09-28 ENCOUNTER — Other Ambulatory Visit: Payer: Self-pay | Admitting: Gastroenterology

## 2013-12-08 ENCOUNTER — Other Ambulatory Visit: Payer: Self-pay | Admitting: Internal Medicine

## 2013-12-08 DIAGNOSIS — Z1231 Encounter for screening mammogram for malignant neoplasm of breast: Secondary | ICD-10-CM

## 2013-12-14 ENCOUNTER — Ambulatory Visit
Admission: RE | Admit: 2013-12-14 | Discharge: 2013-12-14 | Disposition: A | Discharge status: Home / Self Care | Payer: BC Managed Care – PPO | Source: Ambulatory Visit | Attending: Internal Medicine | Admitting: Internal Medicine

## 2013-12-14 DIAGNOSIS — Z1231 Encounter for screening mammogram for malignant neoplasm of breast: Secondary | ICD-10-CM

## 2014-01-19 ENCOUNTER — Other Ambulatory Visit: Payer: Self-pay | Admitting: Gastroenterology

## 2014-01-21 ENCOUNTER — Encounter (HOSPITAL_COMMUNITY): Payer: Self-pay | Admitting: *Deleted

## 2014-03-03 ENCOUNTER — Encounter (HOSPITAL_COMMUNITY): Payer: Self-pay | Admitting: *Deleted

## 2014-03-08 ENCOUNTER — Other Ambulatory Visit: Payer: Self-pay | Admitting: Gastroenterology

## 2014-03-15 ENCOUNTER — Ambulatory Visit (HOSPITAL_COMMUNITY): Payer: BC Managed Care – PPO | Admitting: Anesthesiology

## 2014-03-15 ENCOUNTER — Ambulatory Visit (HOSPITAL_COMMUNITY)
Admission: RE | Admit: 2014-03-15 | Discharge: 2014-03-15 | Disposition: A | Discharge status: Home / Self Care | Payer: BC Managed Care – PPO | Source: Ambulatory Visit | Attending: Gastroenterology | Admitting: Gastroenterology

## 2014-03-15 ENCOUNTER — Encounter (HOSPITAL_COMMUNITY): Payer: Self-pay | Admitting: Anesthesiology

## 2014-03-15 ENCOUNTER — Encounter (HOSPITAL_COMMUNITY)
Admission: RE | Disposition: A | Discharge status: Home / Self Care | Payer: Self-pay | Source: Ambulatory Visit | Attending: Gastroenterology

## 2014-03-15 DIAGNOSIS — E785 Hyperlipidemia, unspecified: Secondary | ICD-10-CM | POA: Insufficient documentation

## 2014-03-15 DIAGNOSIS — I10 Essential (primary) hypertension: Secondary | ICD-10-CM | POA: Diagnosis not present

## 2014-03-15 DIAGNOSIS — E119 Type 2 diabetes mellitus without complications: Secondary | ICD-10-CM | POA: Insufficient documentation

## 2014-03-15 DIAGNOSIS — F17210 Nicotine dependence, cigarettes, uncomplicated: Secondary | ICD-10-CM | POA: Insufficient documentation

## 2014-03-15 DIAGNOSIS — E780 Pure hypercholesterolemia: Secondary | ICD-10-CM | POA: Insufficient documentation

## 2014-03-15 DIAGNOSIS — I2510 Atherosclerotic heart disease of native coronary artery without angina pectoris: Secondary | ICD-10-CM | POA: Diagnosis not present

## 2014-03-15 DIAGNOSIS — Z1211 Encounter for screening for malignant neoplasm of colon: Secondary | ICD-10-CM | POA: Insufficient documentation

## 2014-03-15 DIAGNOSIS — M1990 Unspecified osteoarthritis, unspecified site: Secondary | ICD-10-CM | POA: Insufficient documentation

## 2014-03-15 DIAGNOSIS — K5730 Diverticulosis of large intestine without perforation or abscess without bleeding: Secondary | ICD-10-CM | POA: Insufficient documentation

## 2014-03-15 DIAGNOSIS — Z7982 Long term (current) use of aspirin: Secondary | ICD-10-CM | POA: Diagnosis not present

## 2014-03-15 DIAGNOSIS — K219 Gastro-esophageal reflux disease without esophagitis: Secondary | ICD-10-CM | POA: Diagnosis not present

## 2014-03-15 HISTORY — PX: COLONOSCOPY WITH PROPOFOL: SHX5780

## 2014-03-15 SURGERY — COLONOSCOPY WITH PROPOFOL
Anesthesia: Monitor Anesthesia Care

## 2014-03-15 MED ORDER — PROPOFOL INFUSION 10 MG/ML OPTIME
INTRAVENOUS | Status: DC | PRN
  Administered 2014-03-15: 140 ug/kg/min via INTRAVENOUS

## 2014-03-15 MED ORDER — LACTATED RINGERS IV SOLN
INTRAVENOUS | Status: DC
  Administered 2014-03-15: 10:00:00 via INTRAVENOUS
  Administered 2014-03-15: 1000 mL via INTRAVENOUS

## 2014-03-15 MED ORDER — SODIUM CHLORIDE 0.9 % IV SOLN: INTRAVENOUS | Status: DC

## 2014-03-15 MED ORDER — PROPOFOL 10 MG/ML IV BOLUS
INTRAVENOUS | Status: AC
  Filled 2014-03-15: qty 20

## 2014-03-15 MED ORDER — PROPOFOL 10 MG/ML IV BOLUS
INTRAVENOUS | Status: DC | PRN
  Administered 2014-03-15: 50 mg via INTRAVENOUS

## 2014-03-15 SURGICAL SUPPLY — 21 items

## 2014-03-15 NOTE — H&P (Signed)
  Procedure: Baseline screening colonoscopy. Negative family history for colon cancer.  History: The patient is a 59 year old female who is scheduled to undergo her first screening colonoscopy with polypectomy to prevent colon cancer.  Past medical history: Hysterectomy. Type 2 diabetes mellitus. Hypertension. Hypercholesterolemia. 2005 cardiac catheterization showed noncritical coronary artery disease  Medication allergies: Sulfa drugs. Penicillin. Mycin drugs. Codeine.  Habits: The patient currently smokes one half pack of cigarettes per day.  Exam: The patient is alert and lying comfortably on the endoscopy stretcher. Abdomen is soft and nontender to palpation. Lungs are clear to auscultation. Cardiac exam reveals a regular rhythm.  Plan: Proceed with baseline screening colonoscopy

## 2014-03-15 NOTE — Anesthesia Postprocedure Evaluation (Signed)
  Anesthesia Post-op Note  Patient: Leah Norton  Procedure(s) Performed: Procedure(s) (LRB): COLONOSCOPY WITH PROPOFOL (N/A)  Patient Location: PACU  Anesthesia Type: MAC  Level of Consciousness: awake and alert   Airway and Oxygen Therapy: Patient Spontanous Breathing  Post-op Pain: mild  Post-op Assessment: Post-op Vital signs reviewed, Patient's Cardiovascular Status Stable, Respiratory Function Stable, Patent Airway and No signs of Nausea or vomiting  Last Vitals:  Filed Vitals:   03/15/14 1021  BP: 140/73  Pulse: 72  Temp: 36.5 C  Resp: 18    Post-op Vital Signs: stable   Complications: No apparent anesthesia complications

## 2014-03-15 NOTE — Op Note (Signed)
Procedure: Baseline screening colonoscopy  Endoscopist: Earle Gell  Premedication: Propofol administered by anesthesia  Procedure: The patient was placed in the left lateral decubitus position. Anal inspection and digital rectal exam were normal. The Pentax pediatric colonoscope was introduced into the rectum and advanced to the cecum. A normal-appearing ileocecal valve was identified. A normal-appearing appendiceal orifice was identified. Colonic preparation for the exam today was good. Withdrawal time was 8 minutes  Rectum. Normal. Retroflexed view of the distal rectum normal  Sigmoid colon and descending colon. Colonic diverticulosis  Splenic flexure. Normal  Transverse colon. Normal  Hepatic flexure. Normal  Ascending colon. Normal  Cecum and ileocecal valve. Normal  Assessment: Normal screening colonoscopy  Recommendation: Schedule repeat screening colonoscopy in 10 years

## 2014-03-15 NOTE — Anesthesia Preprocedure Evaluation (Signed)
Anesthesia Evaluation  Patient identified by MRN, date of birth, ID band Patient awake    Reviewed: Allergy & Precautions, H&P , NPO status , Patient's Chart, lab work & pertinent test results  Airway Mallampati: II  TM Distance: >3 FB Neck ROM: Full    Dental no notable dental hx.    Pulmonary Current Smoker,  breath sounds clear to auscultation  Pulmonary exam normal       Cardiovascular hypertension, Pt. on medications and Pt. on home beta blockers + Valvular Problems/Murmurs Rhythm:Regular Rate:Normal     Neuro/Psych  Headaches, PSYCHIATRIC DISORDERS    GI/Hepatic Neg liver ROS, GERD-  Medicated,  Endo/Other  diabetes, Type 2  Renal/GU negative Renal ROS  negative genitourinary   Musculoskeletal  (+) Arthritis -,   Abdominal   Peds negative pediatric ROS (+)  Hematology  (+) anemia ,   Anesthesia Other Findings   Reproductive/Obstetrics negative OB ROS                             Anesthesia Physical Anesthesia Plan  ASA: III  Anesthesia Plan: MAC   Post-op Pain Management:    Induction: Intravenous  Airway Management Planned:   Additional Equipment:   Intra-op Plan:   Post-operative Plan:   Informed Consent: I have reviewed the patients History and Physical, chart, labs and discussed the procedure including the risks, benefits and alternatives for the proposed anesthesia with the patient or authorized representative who has indicated his/her understanding and acceptance.   Dental advisory given  Plan Discussed with: CRNA  Anesthesia Plan Comments:         Anesthesia Quick Evaluation

## 2014-03-15 NOTE — Transfer of Care (Signed)
Immediate Anesthesia Transfer of Care Note  Patient: Leah Norton  Procedure(s) Performed: Procedure(s): COLONOSCOPY WITH PROPOFOL (N/A)  Patient Location: PACU and Endoscopy Unit  Anesthesia Type:MAC  Level of Consciousness: awake, alert  and oriented  Airway & Oxygen Therapy: Patient Spontanous Breathing and Patient connected to face mask oxygen  Post-op Assessment: Report given to PACU RN and Post -op Vital signs reviewed and stable  Post vital signs: Reviewed and stable  Complications: No apparent anesthesia complications

## 2014-03-15 NOTE — Discharge Instructions (Signed)
Colonoscopy, Care After °These instructions give you information on caring for yourself after your procedure. Your doctor may also give you more specific instructions. Call your doctor if you have any problems or questions after your procedure. °HOME CARE °· Do not drive for 24 hours. °· Do not sign important papers or use machinery for 24 hours. °· You may shower. °· You may go back to your usual activities, but go slower for the first 24 hours. °· Take rest breaks often during the first 24 hours. °· Walk around or use warm packs on your belly (abdomen) if you have belly cramping or gas. °· Drink enough fluids to keep your pee (urine) clear or pale yellow. °· Resume your normal diet. Avoid heavy or fried foods. °· Avoid drinking alcohol for 24 hours or as told by your doctor. °· Only take medicines as told by your doctor. °If a tissue sample (biopsy) was taken during the procedure:  °· Do not take aspirin or blood thinners for 7 days, or as told by your doctor. °· Do not drink alcohol for 7 days, or as told by your doctor. °· Eat soft foods for the first 24 hours. °GET HELP IF: °You still have a small amount of blood in your poop (stool) 2-3 days after the procedure. °GET HELP RIGHT AWAY IF: °· You have more than a small amount of blood in your poop. °· You see clumps of tissue (blood clots) in your poop. °· Your belly is puffy (swollen). °· You feel sick to your stomach (nauseous) or throw up (vomit). °· You have a fever. °· You have belly pain that gets worse and medicine does not help. °MAKE SURE YOU: °· Understand these instructions. °· Will watch your condition. °· Will get help right away if you are not doing well or get worse. °Document Released: 03/31/2010 Document Revised: 03/03/2013 Document Reviewed: 11/03/2012 °ExitCare® Patient Information ©2015 [employer]. This information is not intended to replace advice given to you by your health care provider. Make sure you discuss any questions you have with  your health care provider. ° °

## 2014-03-16 ENCOUNTER — Encounter (HOSPITAL_COMMUNITY): Payer: Self-pay | Admitting: Gastroenterology

## 2014-03-16 LAB — GLUCOSE, CAPILLARY: GLUCOSE-CAPILLARY: 129 mg/dL — AB (ref 70–99)

## 2015-01-10 ENCOUNTER — Encounter: Payer: Self-pay | Admitting: Podiatry

## 2015-01-10 ENCOUNTER — Ambulatory Visit (INDEPENDENT_AMBULATORY_CARE_PROVIDER_SITE_OTHER): Payer: BC Managed Care – PPO | Admitting: Podiatry

## 2015-01-10 VITALS — BP 132/76 | HR 88 | Resp 16

## 2015-01-10 DIAGNOSIS — M79675 Pain in left toe(s): Secondary | ICD-10-CM

## 2015-01-10 DIAGNOSIS — M79674 Pain in right toe(s): Secondary | ICD-10-CM | POA: Diagnosis not present

## 2015-01-10 DIAGNOSIS — E119 Type 2 diabetes mellitus without complications: Secondary | ICD-10-CM

## 2015-01-10 DIAGNOSIS — B351 Tinea unguium: Secondary | ICD-10-CM | POA: Diagnosis not present

## 2015-01-10 DIAGNOSIS — L600 Ingrowing nail: Secondary | ICD-10-CM | POA: Diagnosis not present

## 2015-01-10 NOTE — Patient Instructions (Signed)

## 2015-01-10 NOTE — Progress Notes (Signed)
   Subjective:    Patient ID: Leah Norton, female    DOB: Jan 09, 1956, 59 y.o.   MRN: [id number]  HPI Pt presents with painful right great ingrown on medial border   Review of Systems  Musculoskeletal: Positive for back pain and arthralgias.  All other systems reviewed and are negative.      Objective:   Physical Exam        Assessment & Plan:

## 2015-01-11 ENCOUNTER — Telehealth: Payer: Self-pay | Admitting: *Deleted

## 2015-01-11 NOTE — Telephone Encounter (Signed)
Left message at [phone] (Home #) to check to see how they were doing from their ingrown toenail procedure that was performed on Monday, January 10, 2015. Waiting for a response.

## 2015-01-12 NOTE — Progress Notes (Signed)
Subjective:     Patient ID: Leah Norton, female   DOB: 24-Mar-1955, 59 y.o.   MRN: [id number]  HPI patient presents stating my right big toe is ingrown and has been bothering me for several months. I've tried to trim and soak without relief   Review of Systems  All other systems reviewed and are negative.      Objective:   Physical Exam  Constitutional: She is oriented to person, place, and time.  Cardiovascular: Intact distal pulses.   Musculoskeletal: Normal range of motion.  Neurological: She is oriented to person, place, and time.  Skin: Skin is warm.  Nursing note and vitals reviewed.  neurovascular status found to be intact muscle strength adequate with range of motion within normal limits. Patient's found to have good digital perfusion is well oriented 3 and is noted to have incurvated hallux nail border with pain and distal redness with no current drainage noted     Assessment:      ingrown toenail deformity right hallux with distal irritation of tissue    Plan:      H&P and condition reviewed with patient. I have recommended removal of the corner and explained risk of procedure and at this time I infiltrated the right big toe 60 mg Xylocaine Marcaine mixture remove the corner and exposed the matrix applying phenol 3 applications 30 seconds followed by alcohol lavage and sterile dressing. Gave instructions on soaks and reappoint

## 2015-11-17 ENCOUNTER — Other Ambulatory Visit: Payer: Self-pay | Admitting: Internal Medicine

## 2015-11-17 ENCOUNTER — Ambulatory Visit
Admission: RE | Admit: 2015-11-17 | Discharge: 2015-11-17 | Disposition: A | Discharge status: Home / Self Care | Payer: BC Managed Care – PPO | Source: Ambulatory Visit | Attending: Internal Medicine | Admitting: Internal Medicine

## 2015-11-17 DIAGNOSIS — R042 Hemoptysis: Secondary | ICD-10-CM

## 2016-01-27 ENCOUNTER — Encounter: Payer: Self-pay | Admitting: Interventional Cardiology

## 2016-02-08 ENCOUNTER — Encounter: Payer: Self-pay | Admitting: Interventional Cardiology

## 2016-02-08 ENCOUNTER — Ambulatory Visit (INDEPENDENT_AMBULATORY_CARE_PROVIDER_SITE_OTHER): Payer: BC Managed Care – PPO | Admitting: Interventional Cardiology

## 2016-02-08 VITALS — BP 138/82 | HR 70 | Ht 66.0 in | Wt 191.6 lb

## 2016-02-08 DIAGNOSIS — G4730 Sleep apnea, unspecified: Secondary | ICD-10-CM

## 2016-02-08 DIAGNOSIS — IMO0002 Reserved for concepts with insufficient information to code with codable children: Secondary | ICD-10-CM

## 2016-02-08 DIAGNOSIS — E1165 Type 2 diabetes mellitus with hyperglycemia: Secondary | ICD-10-CM

## 2016-02-08 DIAGNOSIS — I2510 Atherosclerotic heart disease of native coronary artery without angina pectoris: Secondary | ICD-10-CM

## 2016-02-08 DIAGNOSIS — E118 Type 2 diabetes mellitus with unspecified complications: Secondary | ICD-10-CM | POA: Diagnosis not present

## 2016-02-08 DIAGNOSIS — F17200 Nicotine dependence, unspecified, uncomplicated: Secondary | ICD-10-CM

## 2016-02-08 DIAGNOSIS — I10 Essential (primary) hypertension: Secondary | ICD-10-CM

## 2016-02-08 DIAGNOSIS — Z9861 Coronary angioplasty status: Secondary | ICD-10-CM

## 2016-02-08 HISTORY — DX: Atherosclerotic heart disease of native coronary artery without angina pectoris: I25.10

## 2016-02-08 NOTE — Progress Notes (Signed)
Cardiology Office Note    Date:  02/08/2016   ID:  Leah Norton, DOB Jan 25, 1956, MRN [id number]  PCP:  Kandice Hams, MD  Cardiologist: Sinclair Grooms, MD   Chief Complaint  Patient presents with  . Coronary Artery Disease    History of Present Illness:  Leah Norton is a 59 y.o. female with history of diabetes, hyper-lipidemia, hypertension, tobacco abuse, nonobstructive coronary disease by angiography in 2005.  She is currently referred by Dr. Seward Carol for cardiac evaluation. She denies chest discomfort. A recent chest CT because of hemoptysis reveal significant three-vessel coronary calcification. I assume this is the concern for Dr. Delfina Redwood. She denies exertional dyspnea and chest pain. She does have sleep apnea that is not being treated. She denies claudication. She has had a previous right ankle fracture that prevents significant aerobic activity. She denies orthopnea, PND, palpitations, and syncope.    Past Medical History:  Diagnosis Date  . Arthritis   . Constipation   . Diabetes mellitus   . GERD (gastroesophageal reflux disease)   . Hypertension     Past Surgical History:  Procedure Laterality Date  . ABDOMINAL HYSTERECTOMY    . COLONOSCOPY WITH PROPOFOL N/A 03/15/2014   Procedure: COLONOSCOPY WITH PROPOFOL;  Surgeon: Garlan Fair, MD;  Location: [facility];  Service: Endoscopy;  Laterality: N/A;  . MOUTH SURGERY     teeth removed  . ORIF ANKLE FRACTURE Right 11/12/2012   Dr Dollene Primrose  . ORIF ANKLE FRACTURE Right 11/12/2012   Procedure: OPEN REDUCTION INTERNAL FIXATION (ORIF) ANKLE FRACTURE;  Surgeon: Marianna Payment, MD;  Location: [facility];  Service: Orthopedics;  Laterality: Right;  Open reduction internal fixation ankle fracture, trimalleolar    Current Medications: Outpatient Medications Prior to Visit  Medication Sig Dispense Refill  . albuterol (PROVENTIL HFA;VENTOLIN HFA) 108 (90 BASE) MCG/ACT inhaler Inhale 1-2 puffs into the lungs  every 6 (six) hours as needed for wheezing. 1 Inhaler 0  . Ascorbic Acid (VITAMIN C PO) Take 1 tablet by mouth daily.    Marland Kitchen aspirin EC 81 MG tablet Take 81 mg by mouth daily.    Marland Kitchen atenolol (TENORMIN) 50 MG tablet Take 50 mg by mouth every morning.     Marland Kitchen buPROPion (WELLBUTRIN SR) 150 MG 12 hr tablet Take 150 mg by mouth 2 (two) times daily as needed (for mood).    . CALCIUM PO Take 1 tablet by mouth daily.    . Cholecalciferol (VITAMIN D PO) Take 1,000 Units by mouth daily.     . hydrochlorothiazide (HYDRODIURIL) 25 MG tablet Take 25 mg by mouth every morning.     . isosorbide mononitrate (IMDUR) 30 MG 24 hr tablet Take 30 mg by mouth every morning.     Marland Kitchen losartan (COZAAR) 50 MG tablet Take 50 mg by mouth every morning.     . pravastatin (PRAVACHOL) 40 MG tablet Take 40 mg by mouth daily.    . sitaGLIPtin-metformin (JANUMET) 50-1000 MG per tablet Take 1 tablet by mouth 2 (two) times daily with a meal.    . oxyCODONE-acetaminophen (PERCOCET/ROXICET) 5-325 MG per tablet Take 1 tablet by mouth every 4 (four) hours as needed for pain.    Marland Kitchen oxyCODONE-acetaminophen (PERCOCET/ROXICET) 5-325 MG per tablet Take 1 tablet by mouth every 4 (four) hours as needed. (Patient not taking: Reported on 02/08/2016) 90 tablet 0  . promethazine (PHENERGAN) 25 MG tablet Take 1 tablet (25 mg total) by mouth every 6 (six) hours as  needed for nausea. (Patient not taking: Reported on 02/08/2016) 30 tablet 0  . Ranitidine HCl (ZANTAC PO) Take 1 tablet by mouth daily as needed (for reflux).      No facility-administered medications prior to visit.      Allergies:   Codeine; Erythromycin; Penicillins; Sulfonamide derivatives; and Vicodin [hydrocodone-acetaminophen]   Social History   Social History  . Marital status: Single    Spouse name: N/A  . Number of children: N/A  . Years of education: N/A   Social History Main Topics  . Smoking status: Current Every Day Smoker    Packs/day: 0.70    Years: 10.00    Types:  Cigarettes  . Smokeless tobacco: Never Used  . Alcohol use No     Comment: special occasions  . Drug use: No  . Sexual activity: Not Asked   Other Topics Concern  . None   Social History Narrative  . None     Family History:  The patient's family history is not on file.   ROS:   Please see the history of present illness.    Constipation, joint swelling, muscle pain, abdominal pain, cough, shortness of breath, and excessive sweating.  All other systems reviewed and are negative.   PHYSICAL EXAM:   VS:  BP 138/82 (BP Location: Right Arm)   Pulse 70   Ht 5\' 6"  (1.676 m)   Wt 191 lb 9.6 oz (86.9 kg)   BMI 30.93 kg/m    GEN: Well nourished, well developed, in no acute distress  HEENT: normal  Neck: no JVD, carotid bruits, or masses Cardiac: RRR; no murmurs, rubs, or gallops,no edema  Respiratory:  clear to auscultation bilaterally, normal work of breathing GI: soft, nontender, nondistended, + BS MS: no deformity or atrophy  Skin: warm and dry, no rash Neuro:  Alert and Oriented x 3, Strength and sensation are intact Psych: euthymic mood, full affect  Wt Readings from Last 3 Encounters:  02/08/16 191 lb 9.6 oz (86.9 kg)  03/15/14 183 lb (83 kg)  11/12/12 172 lb 4.8 oz (78.2 kg)      Studies/Labs Reviewed:   EKG:  EKG  Normal sinus rhythm. Septal infarct with Q waves in V1 and V2. Nonspecific T wave flattening. No change compared to prior tracings.  Recent Labs: No results found for requested labs within last 8760 hours.   Lipid Panel    Component Value Date/Time   CHOL 153 12/23/2008 2225   TRIG 198 (H) 12/23/2008 2225   HDL 33 (L) 12/23/2008 2225   CHOLHDL 4.6 Ratio 12/23/2008 2225   VLDL 40 12/23/2008 2225   LDLCALC 80 12/23/2008 2225    Additional studies/ records that were reviewed today include:  Cardiac catheterization/coronary angiography: 2005  IMPRESSION:  1. Mildly elevated left ventricular end diastolic pressure suggesting     diastolic  dysfunction secondary to hypertension with hypertensive heart     disease.  2. Noncritical coronary artery disease.  3. Noncritical peripheral vascular disease.   RECOMMENDATIONS:  1. Aggressive risk factor modification; and,  2. Continued medical therapy are advised.  3. The patient has been counselled against smoking.   ASSESSMENT:    1. CAD in native artery   2. Essential hypertension   3. TOBACCO ABUSE   4. Uncontrolled type 2 diabetes mellitus with complication, without long-term current use of insulin ([place])   5. Sleep apnea, unspecified type      PLAN:  In order of problems listed above:  1. Coronary  angiography in 2005 demonstrated minimal luminal irregularities with widely patent coronaries. A 2017 chest CT demonstrated moderate three-vessel coronary calcification. Patient is relatively sedentary and has aggressive risk factors including smoking, diabetes, and hypertension. Plan stress/Lexiscan Myoview study to exclude ischemia. 2. Target blood pressure is 130/90 mmHg. Low salt diet is encouraged. 3. Long discussion concerning smoking cessation. She is not ready to commit. 4. Followed by primary care. A1c target is less than 6.5. We also discussed.lipid targets which would include an LDL less than or equal to 70. 5. I encouraged reconsideration of therapy for sleep apnea.    Medication Adjustments/Labs and Tests Ordered: Current medicines are reviewed at length with the patient today.  Concerns regarding medicines are outlined above.  Medication changes, Labs and Tests ordered today are listed in the Patient Instructions below. Patient Instructions  Medication Instructions:  None  Labwork: None  Testing/Procedures: Your physician has requested that you have en exercise stress myoview. For further information please visit [email or web address]. Please follow instruction sheet, as given.   Follow-Up: Your physician recommends that you schedule a follow-up  appointment as needed with Dr. Tamala Julian.    Any Other Special Instructions Will Be Listed Below (If Applicable).  Your physician would like for you to [place]!!!!   Steps to Quit Smoking Smoking tobacco can be bad for your health. It can also affect almost every organ in your body. Smoking puts you and people around you at risk for many serious long-lasting (chronic) diseases. Quitting smoking is hard, but it is one of the best things that you can do for your health. It is never too late to quit. What are the benefits of quitting smoking? When you quit smoking, you lower your risk for getting serious diseases and conditions. They can include:  Lung cancer or lung disease.  Heart disease.  Stroke.  Heart attack.  Not being able to have children (infertility).  Weak bones (osteoporosis) and broken bones (fractures). If you have coughing, wheezing, and shortness of breath, those symptoms may get better when you quit. You may also get sick less often. If you are pregnant, quitting smoking can help to lower your chances of having a baby of low birth weight. What can I do to help me quit smoking? Talk with your doctor about what can help you quit smoking. Some things you can do (strategies) include:  Quitting smoking totally, instead of slowly cutting back how much you smoke over a period of time.  Going to in-person counseling. You are more likely to quit if you go to many counseling sessions.  Using resources and support systems, such as:  Online chats with a Social worker.  Phone quitlines.  Printed Furniture conservator/restorer.  Support groups or group counseling.  Text messaging programs.  Mobile phone apps or applications.  Taking medicines. Some of these medicines may have nicotine in them. If you are pregnant or breastfeeding, do not take any medicines to quit smoking unless your doctor says it is okay. Talk with your doctor about counseling or other things that can help  you. Talk with your doctor about using more than one strategy at the same time, such as taking medicines while you are also going to in-person counseling. This can help make quitting easier. What things can I do to make it easier to quit? Quitting smoking might feel very hard at first, but there is a lot that you can do to make it easier. Take these steps:  Talk to your family and friends.  Ask them to support and encourage you.  Call phone quitlines, reach out to support groups, or work with a Social worker.  Ask people who smoke to not smoke around you.  Avoid places that make you want (trigger) to smoke, such as:  Bars.  Parties.  Smoke-break areas at work.  Spend time with people who do not smoke.  Lower the stress in your life. Stress can make you want to smoke. Try these things to help your stress:  Getting regular exercise.  Deep-breathing exercises.  Yoga.  Meditating.  Doing a body scan. To do this, close your eyes, focus on one area of your body at a time from head to toe, and notice which parts of your body are tense. Try to relax the muscles in those areas.  Download or buy apps on your mobile phone or tablet that can help you stick to your quit plan. There are many free apps, such as QuitGuide from the [employer] Office manager for Disease Control and Prevention). You can find more support from smokefree.gov and other websites. This information is not intended to replace advice given to you by your health care provider. Make sure you discuss any questions you have with your health care provider. Document Released: 12/23/2008 Document Revised: 10/25/2015 Document Reviewed: 07/13/2014 Elsevier Interactive Patient Education  2017 [employer].    If you need a refill on your cardiac medications before your next appointment, please call your pharmacy.      Signed, Sinclair Grooms, MD  02/08/2016 10:31 AM    [facility] [place], [address],  [place]  [address] Phone: [phone]; Fax: [phone]

## 2016-02-08 NOTE — Patient Instructions (Addendum)
Medication Instructions:  None  Labwork: None  Testing/Procedures: Your physician has requested that you have en exercise stress myoview. For further information please visit [email or web address]. Please follow instruction sheet, as given.   Follow-Up: Your physician recommends that you schedule a follow-up appointment as needed with Dr. Tamala Julian.    Any Other Special Instructions Will Be Listed Below (If Applicable).  Your physician would like for you to [place]!!!!   Steps to Quit Smoking Smoking tobacco can be bad for your health. It can also affect almost every organ in your body. Smoking puts you and people around you at risk for many serious long-lasting (chronic) diseases. Quitting smoking is hard, but it is one of the best things that you can do for your health. It is never too late to quit. What are the benefits of quitting smoking? When you quit smoking, you lower your risk for getting serious diseases and conditions. They can include:  Lung cancer or lung disease.  Heart disease.  Stroke.  Heart attack.  Not being able to have children (infertility).  Weak bones (osteoporosis) and broken bones (fractures). If you have coughing, wheezing, and shortness of breath, those symptoms may get better when you quit. You may also get sick less often. If you are pregnant, quitting smoking can help to lower your chances of having a baby of low birth weight. What can I do to help me quit smoking? Talk with your doctor about what can help you quit smoking. Some things you can do (strategies) include:  Quitting smoking totally, instead of slowly cutting back how much you smoke over a period of time.  Going to in-person counseling. You are more likely to quit if you go to many counseling sessions.  Using resources and support systems, such as:  Online chats with a Social worker.  Phone quitlines.  Printed Furniture conservator/restorer.  Support groups or group counseling.  Text  messaging programs.  Mobile phone apps or applications.  Taking medicines. Some of these medicines may have nicotine in them. If you are pregnant or breastfeeding, do not take any medicines to quit smoking unless your doctor says it is okay. Talk with your doctor about counseling or other things that can help you. Talk with your doctor about using more than one strategy at the same time, such as taking medicines while you are also going to in-person counseling. This can help make quitting easier. What things can I do to make it easier to quit? Quitting smoking might feel very hard at first, but there is a lot that you can do to make it easier. Take these steps:  Talk to your family and friends. Ask them to support and encourage you.  Call phone quitlines, reach out to support groups, or work with a Social worker.  Ask people who smoke to not smoke around you.  Avoid places that make you want (trigger) to smoke, such as:  Bars.  Parties.  Smoke-break areas at work.  Spend time with people who do not smoke.  Lower the stress in your life. Stress can make you want to smoke. Try these things to help your stress:  Getting regular exercise.  Deep-breathing exercises.  Yoga.  Meditating.  Doing a body scan. To do this, close your eyes, focus on one area of your body at a time from head to toe, and notice which parts of your body are tense. Try to relax the muscles in those areas.  Download or buy apps on your mobile phone  or tablet that can help you stick to your quit plan. There are many free apps, such as QuitGuide from the [employer] Office manager for Disease Control and Prevention). You can find more support from smokefree.gov and other websites. This information is not intended to replace advice given to you by your health care provider. Make sure you discuss any questions you have with your health care provider. Document Released: 12/23/2008 Document Revised: 10/25/2015 Document Reviewed:  07/13/2014 Elsevier Interactive Patient Education  2017 [employer].    If you need a refill on your cardiac medications before your next appointment, please call your pharmacy.

## 2016-02-13 ENCOUNTER — Telehealth (HOSPITAL_COMMUNITY): Payer: Self-pay | Admitting: *Deleted

## 2016-02-13 NOTE — Telephone Encounter (Signed)
Left message on voicemail per DPR in reference to upcoming appointment scheduled on 02/15/16 at [facility] with detailed instructions given per Myocardial Perfusion Study Information Sheet for the test. LM to arrive 15 minutes early, and that it is imperative to arrive on time for appointment to keep from having the test rescheduled. If you need to cancel or reschedule your appointment, please call the office within 24 hours of your appointment. Failure to do so may result in a cancellation of your appointment, and a $50 no show fee. Phone number given for call back for any questions.

## 2016-02-15 ENCOUNTER — Ambulatory Visit (HOSPITAL_COMMUNITY): Payer: BC Managed Care – PPO | Attending: Cardiology

## 2016-02-15 DIAGNOSIS — E7800 Pure hypercholesterolemia, unspecified: Secondary | ICD-10-CM | POA: Insufficient documentation

## 2016-02-15 DIAGNOSIS — I510 Cardiac septal defect, acquired: Secondary | ICD-10-CM | POA: Diagnosis not present

## 2016-02-15 DIAGNOSIS — I10 Essential (primary) hypertension: Secondary | ICD-10-CM | POA: Insufficient documentation

## 2016-02-15 DIAGNOSIS — I2510 Atherosclerotic heart disease of native coronary artery without angina pectoris: Secondary | ICD-10-CM | POA: Insufficient documentation

## 2016-02-15 LAB — MYOCARDIAL PERFUSION IMAGING
CHL CUP MPHR: 160 {beats}/min
CHL CUP NUCLEAR SRS: 4
CHL CUP NUCLEAR SSS: 11
CHL CUP RESTING HR STRESS: 69 {beats}/min
CHL RATE OF PERCEIVED EXERTION: 18
CSEPED: 6 min
CSEPEDS: 0 s
CSEPEW: 7 METS
LV dias vol: 123 mL (ref 46–106)
LV sys vol: 63 mL
Peak HR: 137 {beats}/min
Percent HR: 86 %
RATE: 0.32
SDS: 7
TID: 1.02

## 2016-02-15 MED ORDER — TECHNETIUM TC 99M TETROFOSMIN IV KIT
10.2000 | PACK | Freq: Once | INTRAVENOUS | Status: AC | PRN
  Administered 2016-02-15: 10.2 via INTRAVENOUS
  Filled 2016-02-15: qty 11

## 2016-02-15 MED ORDER — TECHNETIUM TC 99M TETROFOSMIN IV KIT
31.8000 | PACK | Freq: Once | INTRAVENOUS | Status: AC | PRN
  Administered 2016-02-15: 31.8 via INTRAVENOUS
  Filled 2016-02-15: qty 32

## 2016-02-16 ENCOUNTER — Telehealth: Payer: Self-pay | Admitting: Interventional Cardiology

## 2016-02-16 DIAGNOSIS — Z01812 Encounter for preprocedural laboratory examination: Secondary | ICD-10-CM

## 2016-02-16 NOTE — Telephone Encounter (Signed)
Returning a call . Thanks  °

## 2016-02-16 NOTE — Telephone Encounter (Signed)
Left message to call back  

## 2016-02-17 ENCOUNTER — Encounter: Payer: Self-pay | Admitting: *Deleted

## 2016-02-17 NOTE — Telephone Encounter (Signed)
Left message to call back  Cath is scheduled and letter is ready.  Will await call back to go over instructions.

## 2016-02-17 NOTE — Telephone Encounter (Signed)
Spoke with pt and went over instructions.  Pt verbalized understanding and was in agreement with plan.  Letter placed in mail.  Lab changed to 02/28/16 per pt request.

## 2016-02-17 NOTE — Telephone Encounter (Signed)
Pt returned this office call please give her a call back. °

## 2016-02-17 NOTE — Telephone Encounter (Signed)
Spoke with pt and made her aware of stress test results and need for heart cath.  Pt agreeable.  Pt would like to have cath done on 12/20.  Advised I will get everything scheduled and then call back with instructions.  Pt verbalized understanding.

## 2016-02-21 ENCOUNTER — Other Ambulatory Visit: Payer: Self-pay | Admitting: Interventional Cardiology

## 2016-02-21 DIAGNOSIS — R9439 Abnormal result of other cardiovascular function study: Secondary | ICD-10-CM

## 2016-02-28 ENCOUNTER — Other Ambulatory Visit: Payer: BC Managed Care – PPO | Admitting: *Deleted

## 2016-02-28 DIAGNOSIS — Z01812 Encounter for preprocedural laboratory examination: Secondary | ICD-10-CM

## 2016-02-28 LAB — CBC
HCT: 36.6 % (ref 35.0–45.0)
Hemoglobin: 11.9 g/dL (ref 11.7–15.5)
MCH: 28.6 pg (ref 27.0–33.0)
MCHC: 32.5 g/dL (ref 32.0–36.0)
MCV: 88 fL (ref 80.0–100.0)
MPV: 9.9 fL (ref 7.5–12.5)
PLATELETS: 434 10*3/uL — AB (ref 140–400)
RBC: 4.16 MIL/uL (ref 3.80–5.10)
RDW: 14.1 % (ref 11.0–15.0)
WBC: 7.7 10*3/uL (ref 3.8–10.8)

## 2016-02-28 LAB — BASIC METABOLIC PANEL
BUN: 12 mg/dL (ref 7–25)
CALCIUM: 9.7 mg/dL (ref 8.6–10.4)
CO2: 27 mmol/L (ref 20–31)
CREATININE: 0.74 mg/dL (ref 0.50–0.99)
Chloride: 102 mmol/L (ref 98–110)
Glucose, Bld: 149 mg/dL — ABNORMAL HIGH (ref 65–99)
Potassium: 4.1 mmol/L (ref 3.5–5.3)
Sodium: 139 mmol/L (ref 135–146)

## 2016-02-28 LAB — PROTIME-INR
INR: 1
PROTHROMBIN TIME: 10.5 s (ref 9.0–11.5)

## 2016-02-29 ENCOUNTER — Encounter (HOSPITAL_COMMUNITY)
Admission: RE | Disposition: A | Discharge status: Home / Self Care | Payer: Self-pay | Source: Ambulatory Visit | Attending: Interventional Cardiology

## 2016-02-29 ENCOUNTER — Other Ambulatory Visit: Payer: Self-pay

## 2016-02-29 ENCOUNTER — Encounter (HOSPITAL_COMMUNITY): Payer: Self-pay | Admitting: Interventional Cardiology

## 2016-02-29 ENCOUNTER — Ambulatory Visit (HOSPITAL_COMMUNITY)
Admission: RE | Admit: 2016-02-29 | Discharge: 2016-03-01 | Disposition: A | Discharge status: Home / Self Care | Payer: BC Managed Care – PPO | Source: Ambulatory Visit | Attending: Interventional Cardiology | Admitting: Interventional Cardiology

## 2016-02-29 DIAGNOSIS — Z880 Allergy status to penicillin: Secondary | ICD-10-CM | POA: Insufficient documentation

## 2016-02-29 DIAGNOSIS — Z955 Presence of coronary angioplasty implant and graft: Secondary | ICD-10-CM

## 2016-02-29 DIAGNOSIS — Z885 Allergy status to narcotic agent status: Secondary | ICD-10-CM | POA: Diagnosis not present

## 2016-02-29 DIAGNOSIS — I2510 Atherosclerotic heart disease of native coronary artery without angina pectoris: Secondary | ICD-10-CM | POA: Diagnosis present

## 2016-02-29 DIAGNOSIS — Z7982 Long term (current) use of aspirin: Secondary | ICD-10-CM | POA: Insufficient documentation

## 2016-02-29 DIAGNOSIS — Z882 Allergy status to sulfonamides status: Secondary | ICD-10-CM | POA: Diagnosis not present

## 2016-02-29 DIAGNOSIS — R9439 Abnormal result of other cardiovascular function study: Secondary | ICD-10-CM | POA: Diagnosis not present

## 2016-02-29 DIAGNOSIS — M1990 Unspecified osteoarthritis, unspecified site: Secondary | ICD-10-CM | POA: Insufficient documentation

## 2016-02-29 DIAGNOSIS — F17210 Nicotine dependence, cigarettes, uncomplicated: Secondary | ICD-10-CM | POA: Diagnosis not present

## 2016-02-29 DIAGNOSIS — Z9861 Coronary angioplasty status: Secondary | ICD-10-CM

## 2016-02-29 DIAGNOSIS — E1165 Type 2 diabetes mellitus with hyperglycemia: Secondary | ICD-10-CM | POA: Insufficient documentation

## 2016-02-29 DIAGNOSIS — I119 Hypertensive heart disease without heart failure: Secondary | ICD-10-CM | POA: Diagnosis not present

## 2016-02-29 DIAGNOSIS — Z7984 Long term (current) use of oral hypoglycemic drugs: Secondary | ICD-10-CM | POA: Diagnosis not present

## 2016-02-29 DIAGNOSIS — K219 Gastro-esophageal reflux disease without esophagitis: Secondary | ICD-10-CM | POA: Diagnosis not present

## 2016-02-29 DIAGNOSIS — K5900 Constipation, unspecified: Secondary | ICD-10-CM | POA: Insufficient documentation

## 2016-02-29 DIAGNOSIS — G4730 Sleep apnea, unspecified: Secondary | ICD-10-CM | POA: Diagnosis not present

## 2016-02-29 DIAGNOSIS — E1151 Type 2 diabetes mellitus with diabetic peripheral angiopathy without gangrene: Secondary | ICD-10-CM | POA: Diagnosis not present

## 2016-02-29 DIAGNOSIS — I10 Essential (primary) hypertension: Secondary | ICD-10-CM | POA: Diagnosis present

## 2016-02-29 DIAGNOSIS — E785 Hyperlipidemia, unspecified: Secondary | ICD-10-CM | POA: Diagnosis not present

## 2016-02-29 HISTORY — DX: Other complications of anesthesia, initial encounter: T88.59XA

## 2016-02-29 HISTORY — DX: Adverse effect of unspecified anesthetic, initial encounter: T41.45XA

## 2016-02-29 HISTORY — DX: Pure hypercholesterolemia, unspecified: E78.00

## 2016-02-29 HISTORY — DX: Angina pectoris, unspecified: I20.9

## 2016-02-29 HISTORY — DX: Atherosclerotic heart disease of native coronary artery without angina pectoris: I25.10

## 2016-02-29 HISTORY — DX: Type 2 diabetes mellitus without complications: E11.9

## 2016-02-29 HISTORY — DX: Migraine, unspecified, not intractable, without status migrainosus: G43.909

## 2016-02-29 HISTORY — PX: CARDIAC CATHETERIZATION: SHX172

## 2016-02-29 HISTORY — DX: Cardiomegaly: I51.7

## 2016-02-29 LAB — POCT ACTIVATED CLOTTING TIME
ACTIVATED CLOTTING TIME: 208 s
Activated Clotting Time: 213 seconds
Activated Clotting Time: 252 seconds
Activated Clotting Time: 263 seconds
Activated Clotting Time: 279 seconds
Activated Clotting Time: 175 seconds

## 2016-02-29 LAB — GLUCOSE, CAPILLARY
GLUCOSE-CAPILLARY: 140 mg/dL — AB (ref 65–99)
Glucose-Capillary: 134 mg/dL — ABNORMAL HIGH (ref 65–99)
Glucose-Capillary: 188 mg/dL — ABNORMAL HIGH (ref 65–99)
Glucose-Capillary: 243 mg/dL — ABNORMAL HIGH (ref 65–99)

## 2016-02-29 SURGERY — LEFT HEART CATH AND CORONARY ANGIOGRAPHY
Anesthesia: LOCAL

## 2016-02-29 MED ORDER — LIDOCAINE HCL (PF) 1 % IJ SOLN
INTRAMUSCULAR | Status: AC
  Filled 2016-02-29: qty 30

## 2016-02-29 MED ORDER — NITROGLYCERIN 1 MG/10 ML FOR IR/CATH LAB
INTRA_ARTERIAL | Status: DC | PRN
  Administered 2016-02-29: 200 ug via INTRACORONARY

## 2016-02-29 MED ORDER — LABETALOL HCL 5 MG/ML IV SOLN: 10.0000 mg | INTRAVENOUS | Status: AC | PRN

## 2016-02-29 MED ORDER — TICAGRELOR 90 MG PO TABS
ORAL_TABLET | ORAL | Status: DC | PRN
  Administered 2016-02-29: 180 mg via ORAL

## 2016-02-29 MED ORDER — SODIUM CHLORIDE 0.9 % WEIGHT BASED INFUSION: 1.0000 mL/kg/h | INTRAVENOUS | Status: DC

## 2016-02-29 MED ORDER — VERAPAMIL HCL 2.5 MG/ML IV SOLN
INTRAVENOUS | Status: AC
  Filled 2016-02-29: qty 2

## 2016-02-29 MED ORDER — SODIUM CHLORIDE 0.9% FLUSH: 3.0000 mL | INTRAVENOUS | Status: DC | PRN

## 2016-02-29 MED ORDER — ALBUTEROL SULFATE (2.5 MG/3ML) 0.083% IN NEBU: 3.0000 mL | INHALATION_SOLUTION | Freq: Four times a day (QID) | RESPIRATORY_TRACT | Status: DC | PRN

## 2016-02-29 MED ORDER — HYDROCHLOROTHIAZIDE 25 MG PO TABS
25.0000 mg | ORAL_TABLET | Freq: Every morning | ORAL | Status: DC
  Administered 2016-03-01: 09:00:00 25 mg via ORAL
  Filled 2016-02-29: qty 1

## 2016-02-29 MED ORDER — SODIUM CHLORIDE 0.9 % WEIGHT BASED INFUSION
3.0000 mL/kg/h | INTRAVENOUS | Status: DC
  Administered 2016-02-29: 3 mL/kg/h via INTRAVENOUS

## 2016-02-29 MED ORDER — HEPARIN SODIUM (PORCINE) 1000 UNIT/ML IJ SOLN
INTRAMUSCULAR | Status: AC
  Filled 2016-02-29: qty 1

## 2016-02-29 MED ORDER — ANGIOPLASTY BOOK
Freq: Once | Status: AC
  Administered 2016-02-29: 22:00:00
  Filled 2016-02-29: qty 1

## 2016-02-29 MED ORDER — SITAGLIPTIN PHOS-METFORMIN HCL 50-1000 MG PO TABS: 1.0000 | ORAL_TABLET | Freq: Two times a day (BID) | ORAL | Status: DC

## 2016-02-29 MED ORDER — HEPARIN (PORCINE) IN NACL 2-0.9 UNIT/ML-% IJ SOLN
INTRAMUSCULAR | Status: DC | PRN
  Administered 2016-02-29: 1000 mL

## 2016-02-29 MED ORDER — LIDOCAINE HCL (PF) 1 % IJ SOLN
INTRAMUSCULAR | Status: DC | PRN
  Administered 2016-02-29: 15 mL
  Administered 2016-02-29: 2 mL

## 2016-02-29 MED ORDER — MIDAZOLAM HCL 2 MG/2ML IJ SOLN
INTRAMUSCULAR | Status: AC
  Filled 2016-02-29: qty 2

## 2016-02-29 MED ORDER — ASPIRIN 81 MG PO CHEW: 81.0000 mg | CHEWABLE_TABLET | ORAL | Status: DC

## 2016-02-29 MED ORDER — ATENOLOL 50 MG PO TABS
50.0000 mg | ORAL_TABLET | Freq: Every day | ORAL | Status: DC
  Administered 2016-03-01: 09:00:00 50 mg via ORAL
  Filled 2016-02-29: qty 1

## 2016-02-29 MED ORDER — SODIUM CHLORIDE 0.9 % IV SOLN: 250.0000 mL | INTRAVENOUS | Status: DC | PRN

## 2016-02-29 MED ORDER — TICAGRELOR 90 MG PO TABS
ORAL_TABLET | ORAL | Status: AC
  Filled 2016-02-29: qty 2

## 2016-02-29 MED ORDER — BUPROPION HCL ER (SR) 150 MG PO TB12
150.0000 mg | ORAL_TABLET | Freq: Every day | ORAL | Status: DC
  Administered 2016-03-01: 150 mg via ORAL
  Filled 2016-02-29: qty 1

## 2016-02-29 MED ORDER — IOPAMIDOL (ISOVUE-370) INJECTION 76%
INTRAVENOUS | Status: AC
  Filled 2016-02-29: qty 100

## 2016-02-29 MED ORDER — FENTANYL CITRATE (PF) 100 MCG/2ML IJ SOLN
INTRAMUSCULAR | Status: AC
  Filled 2016-02-29: qty 2

## 2016-02-29 MED ORDER — VERAPAMIL HCL 2.5 MG/ML IV SOLN
INTRAVENOUS | Status: DC | PRN
  Administered 2016-02-29: 10 mL via INTRA_ARTERIAL

## 2016-02-29 MED ORDER — TICAGRELOR 90 MG PO TABS
90.0000 mg | ORAL_TABLET | Freq: Two times a day (BID) | ORAL | Status: DC
  Administered 2016-02-29 – 2016-03-01 (×2): 90 mg via ORAL
  Filled 2016-02-29 (×2): qty 1

## 2016-02-29 MED ORDER — HEPARIN SODIUM (PORCINE) 1000 UNIT/ML IJ SOLN
INTRAMUSCULAR | Status: DC | PRN
  Administered 2016-02-29: 9000 [IU] via INTRAVENOUS
  Administered 2016-02-29: 3000 [IU] via INTRAVENOUS
  Administered 2016-02-29: 2000 [IU] via INTRAVENOUS
  Administered 2016-02-29: 3000 [IU] via INTRAVENOUS

## 2016-02-29 MED ORDER — NITROGLYCERIN 1 MG/10 ML FOR IR/CATH LAB
INTRA_ARTERIAL | Status: AC
  Filled 2016-02-29: qty 10

## 2016-02-29 MED ORDER — SODIUM CHLORIDE 0.9 % IV SOLN: INTRAVENOUS | Status: AC

## 2016-02-29 MED ORDER — ATORVASTATIN CALCIUM 80 MG PO TABS
80.0000 mg | ORAL_TABLET | Freq: Every day | ORAL | Status: DC
  Administered 2016-02-29: 80 mg via ORAL
  Filled 2016-02-29: qty 1

## 2016-02-29 MED ORDER — ONDANSETRON HCL 4 MG/2ML IJ SOLN: 4.0000 mg | Freq: Four times a day (QID) | INTRAMUSCULAR | Status: DC | PRN

## 2016-02-29 MED ORDER — METFORMIN HCL 500 MG PO TABS: 1000.0000 mg | ORAL_TABLET | Freq: Two times a day (BID) | ORAL | Status: DC

## 2016-02-29 MED ORDER — VITAMIN D 1000 UNITS PO TABS
2000.0000 [IU] | ORAL_TABLET | Freq: Every day | ORAL | Status: DC
  Administered 2016-02-29 – 2016-03-01 (×2): 2000 [IU] via ORAL
  Filled 2016-02-29 (×2): qty 2

## 2016-02-29 MED ORDER — LINAGLIPTIN 5 MG PO TABS
5.0000 mg | ORAL_TABLET | Freq: Every day | ORAL | Status: DC
  Administered 2016-03-01: 09:00:00 5 mg via ORAL
  Filled 2016-02-29: qty 1

## 2016-02-29 MED ORDER — SODIUM CHLORIDE 0.9% FLUSH
3.0000 mL | Freq: Two times a day (BID) | INTRAVENOUS | Status: DC
  Administered 2016-02-29 (×2): 3 mL via INTRAVENOUS

## 2016-02-29 MED ORDER — MIDAZOLAM HCL 2 MG/2ML IJ SOLN
INTRAMUSCULAR | Status: DC | PRN
  Administered 2016-02-29 (×2): 1 mg via INTRAVENOUS

## 2016-02-29 MED ORDER — LOSARTAN POTASSIUM 50 MG PO TABS
50.0000 mg | ORAL_TABLET | Freq: Every morning | ORAL | Status: DC
  Administered 2016-03-01: 50 mg via ORAL
  Filled 2016-02-29: qty 1

## 2016-02-29 MED ORDER — IOPAMIDOL (ISOVUE-370) INJECTION 76%
INTRAVENOUS | Status: DC | PRN
  Administered 2016-02-29: 150 mL via INTRA_ARTERIAL

## 2016-02-29 MED ORDER — FENTANYL CITRATE (PF) 100 MCG/2ML IJ SOLN
INTRAMUSCULAR | Status: DC | PRN
  Administered 2016-02-29 (×2): 50 ug via INTRAVENOUS

## 2016-02-29 MED ORDER — ASPIRIN 81 MG PO CHEW
81.0000 mg | CHEWABLE_TABLET | Freq: Every day | ORAL | Status: DC
  Administered 2016-03-01: 81 mg via ORAL
  Filled 2016-02-29: qty 1

## 2016-02-29 MED ORDER — HEPARIN SODIUM (PORCINE) 5000 UNIT/ML IJ SOLN
5000.0000 [IU] | Freq: Three times a day (TID) | INTRAMUSCULAR | Status: DC
  Administered 2016-02-29 – 2016-03-01 (×2): 5000 [IU] via SUBCUTANEOUS
  Filled 2016-02-29 (×2): qty 1

## 2016-02-29 MED ORDER — HYDRALAZINE HCL 20 MG/ML IJ SOLN: 5.0000 mg | INTRAMUSCULAR | Status: AC | PRN

## 2016-02-29 MED ORDER — HEPARIN (PORCINE) IN NACL 2-0.9 UNIT/ML-% IJ SOLN
INTRAMUSCULAR | Status: AC
  Filled 2016-02-29: qty 1500

## 2016-02-29 MED ORDER — SODIUM CHLORIDE 0.9% FLUSH: 3.0000 mL | Freq: Two times a day (BID) | INTRAVENOUS | Status: DC

## 2016-02-29 SURGICAL SUPPLY — 22 items
BALLN ANGIOSCULPT RX 3.0X10 (BALLOONS) ×2
BALLN ~~LOC~~ EUPHORA RX 3.75X8 (BALLOONS) ×2
BALLOON ANGIOSCULPT RX 3.0X10 (BALLOONS) ×1 IMPLANT
BALLOON ~~LOC~~ EUPHORA RX 3.75X8 (BALLOONS) ×1 IMPLANT
CATH EXPO 5FR FR4 (CATHETERS) ×2 IMPLANT
CATH INFINITI 5FR JL4 (CATHETERS) ×2 IMPLANT
CATH VISTA GUIDE 6FR XBLAD3.5 (CATHETERS) ×2 IMPLANT
DEVICE RAD COMP TR BAND LRG (VASCULAR PRODUCTS) ×2 IMPLANT
GLIDESHEATH SLEND A-KIT 6F 22G (SHEATH) ×2 IMPLANT
GUIDEWIRE INQWIRE 1.5J.035X260 (WIRE) ×1 IMPLANT
INQWIRE 1.5J .035X260CM (WIRE) ×2
KIT ENCORE 26 ADVANTAGE (KITS) ×2 IMPLANT
KIT HEART LEFT (KITS) ×2 IMPLANT
PACK CARDIAC CATHETERIZATION (CUSTOM PROCEDURE TRAY) ×2 IMPLANT
SHEATH PINNACLE 5F 10CM (SHEATH) ×2 IMPLANT
SHEATH PINNACLE 6F 10CM (SHEATH) ×2 IMPLANT
STENT PROMUS PREM MR 3.5X16 (Permanent Stent) ×2 IMPLANT
TRANSDUCER W/STOPCOCK (MISCELLANEOUS) ×2 IMPLANT
TUBING CIL FLEX 10 FLL-RA (TUBING) ×2 IMPLANT
WIRE ASAHI PROWATER 180CM (WIRE) ×2 IMPLANT
WIRE EMERALD 3MM-J .035X150CM (WIRE) ×4 IMPLANT
WIRE HI TORQ VERSACORE-J 145CM (WIRE) ×2 IMPLANT

## 2016-02-29 NOTE — CV Procedure (Signed)
   Unable to advance diagnostic catheters from the right radial approach likely related to entry in the low origin of a recurrent radial artery. This led to switch over to right femoral approach without difficulty.  70 to 80% proximal LAD stenosis. This matches the perfusion defect noted on nuclear imaging. There is also a 50-70% mid circumflex, 75% mid ramus intermedius, and 50% mid diagonal stenosis.  Successful angioplasty and stenting of the proximal LAD to 0% with TIMI grade 3 flow using a 16 x 3.5 Promus Premier postdilated to 3.75 mm in diameter. TIMI grade 3 flow.  In the ACT 260 seconds plus.

## 2016-02-29 NOTE — H&P (View-Only) (Signed)
Cardiology Office Note    Date:  02/08/2016   ID:  Leah Norton, DOB [birth year], MRN [id number]  PCP:  Kandice Hams, MD  Cardiologist: Sinclair Grooms, MD   Chief Complaint  Patient presents with  . Coronary Artery Disease    History of Present Illness:  Leah Norton is a 60 y.o. female with history of diabetes, hyper-lipidemia, hypertension, tobacco abuse, nonobstructive coronary disease by angiography in 2005.  She is currently referred by Dr. Seward Carol for cardiac evaluation. She denies chest discomfort. A recent chest CT because of hemoptysis reveal significant three-vessel coronary calcification. I assume this is the concern for Dr. Delfina Redwood. She denies exertional dyspnea and chest pain. She does have sleep apnea that is not being treated. She denies claudication. She has had a previous right ankle fracture that prevents significant aerobic activity. She denies orthopnea, PND, palpitations, and syncope.    Past Medical History:  Diagnosis Date  . Arthritis   . Constipation   . Diabetes mellitus   . GERD (gastroesophageal reflux disease)   . Hypertension     Past Surgical History:  Procedure Laterality Date  . ABDOMINAL HYSTERECTOMY    . COLONOSCOPY WITH PROPOFOL N/A 03/15/2014   Procedure: COLONOSCOPY WITH PROPOFOL;  Surgeon: Garlan Fair, MD;  Location: [facility];  Service: Endoscopy;  Laterality: N/A;  . MOUTH SURGERY     teeth removed  . ORIF ANKLE FRACTURE Right 11/12/2012   Dr Dollene Primrose  . ORIF ANKLE FRACTURE Right 11/12/2012   Procedure: OPEN REDUCTION INTERNAL FIXATION (ORIF) ANKLE FRACTURE;  Surgeon: Marianna Payment, MD;  Location: [place];  Service: Orthopedics;  Laterality: Right;  Open reduction internal fixation ankle fracture, trimalleolar    Current Medications: Outpatient Medications Prior to Visit  Medication Sig Dispense Refill  . albuterol (PROVENTIL HFA;VENTOLIN HFA) 108 (90 BASE) MCG/ACT inhaler Inhale 1-2 puffs into the lungs  every 6 (six) hours as needed for wheezing. 1 Inhaler 0  . Ascorbic Acid (VITAMIN C PO) Take 1 tablet by mouth daily.    Marland Kitchen aspirin EC 81 MG tablet Take 81 mg by mouth daily.    Marland Kitchen atenolol (TENORMIN) 50 MG tablet Take 50 mg by mouth every morning.     Marland Kitchen buPROPion (WELLBUTRIN SR) 150 MG 12 hr tablet Take 150 mg by mouth 2 (two) times daily as needed (for mood).    . CALCIUM PO Take 1 tablet by mouth daily.    . Cholecalciferol (VITAMIN D PO) Take 1,000 Units by mouth daily.     . hydrochlorothiazide (HYDRODIURIL) 25 MG tablet Take 25 mg by mouth every morning.     . isosorbide mononitrate (IMDUR) 30 MG 24 hr tablet Take 30 mg by mouth every morning.     Marland Kitchen losartan (COZAAR) 50 MG tablet Take 50 mg by mouth every morning.     . pravastatin (PRAVACHOL) 40 MG tablet Take 40 mg by mouth daily.    . sitaGLIPtin-metformin (JANUMET) 50-1000 MG per tablet Take 1 tablet by mouth 2 (two) times daily with a meal.    . oxyCODONE-acetaminophen (PERCOCET/ROXICET) 5-325 MG per tablet Take 1 tablet by mouth every 4 (four) hours as needed for pain.    Marland Kitchen oxyCODONE-acetaminophen (PERCOCET/ROXICET) 5-325 MG per tablet Take 1 tablet by mouth every 4 (four) hours as needed. (Patient not taking: Reported on 02/08/2016) 90 tablet 0  . promethazine (PHENERGAN) 25 MG tablet Take 1 tablet (25 mg total) by mouth every 6 (six) hours as  needed for nausea. (Patient not taking: Reported on 02/08/2016) 30 tablet 0  . Ranitidine HCl (ZANTAC PO) Take 1 tablet by mouth daily as needed (for reflux).      No facility-administered medications prior to visit.      Allergies:   Codeine; Erythromycin; Penicillins; Sulfonamide derivatives; and Vicodin [hydrocodone-acetaminophen]   Social History   Social History  . Marital status: Single    Spouse name: N/A  . Number of children: N/A  . Years of education: N/A   Social History Main Topics  . Smoking status: Current Every Day Smoker    Packs/day: 0.70    Years: 10.00    Types:  Cigarettes  . Smokeless tobacco: Never Used  . Alcohol use No     Comment: special occasions  . Drug use: No  . Sexual activity: Not Asked   Other Topics Concern  . None   Social History Narrative  . None     Family History:  The patient's family history is not on file.   ROS:   Please see the history of present illness.    Constipation, joint swelling, muscle pain, abdominal pain, cough, shortness of breath, and excessive sweating.  All other systems reviewed and are negative.   PHYSICAL EXAM:   VS:  BP 138/82 (BP Location: Right Arm)   Pulse 70   Ht 5\' 6"  (1.676 m)   Wt 191 lb 9.6 oz (86.9 kg)   BMI 30.93 kg/m    GEN: Well nourished, well developed, in no acute distress  HEENT: normal  Neck: no JVD, carotid bruits, or masses Cardiac: RRR; no murmurs, rubs, or gallops,no edema  Respiratory:  clear to auscultation bilaterally, normal work of breathing GI: soft, nontender, nondistended, + BS MS: no deformity or atrophy  Skin: warm and dry, no rash Neuro:  Alert and Oriented x 3, Strength and sensation are intact Psych: euthymic mood, full affect  Wt Readings from Last 3 Encounters:  02/08/16 191 lb 9.6 oz (86.9 kg)  03/15/14 183 lb (83 kg)  11/12/12 172 lb 4.8 oz (78.2 kg)      Studies/Labs Reviewed:   EKG:  EKG  Normal sinus rhythm. Septal infarct with Q waves in V1 and V2. Nonspecific T wave flattening. No change compared to prior tracings.  Recent Labs: No results found for requested labs within last 8760 hours.   Lipid Panel    Component Value Date/Time   CHOL 153 12/23/2008 2225   TRIG 198 (H) 12/23/2008 2225   HDL 33 (L) 12/23/2008 2225   CHOLHDL 4.6 Ratio 12/23/2008 2225   VLDL 40 12/23/2008 2225   LDLCALC 80 12/23/2008 2225    Additional studies/ records that were reviewed today include:  Cardiac catheterization/coronary angiography: 2005  IMPRESSION:  1. Mildly elevated left ventricular end diastolic pressure suggesting     diastolic  dysfunction secondary to hypertension with hypertensive heart     disease.  2. Noncritical coronary artery disease.  3. Noncritical peripheral vascular disease.   RECOMMENDATIONS:  1. Aggressive risk factor modification; and,  2. Continued medical therapy are advised.  3. The patient has been counselled against smoking.   ASSESSMENT:    1. CAD in native artery   2. Essential hypertension   3. TOBACCO ABUSE   4. Uncontrolled type 2 diabetes mellitus with complication, without long-term current use of insulin ([place])   5. Sleep apnea, unspecified type      PLAN:  In order of problems listed above:  1. Coronary  angiography in 2005 demonstrated minimal luminal irregularities with widely patent coronaries. A 2017 chest CT demonstrated moderate three-vessel coronary calcification. Patient is relatively sedentary and has aggressive risk factors including smoking, diabetes, and hypertension. Plan stress/Lexiscan Myoview study to exclude ischemia. 2. Target blood pressure is 130/90 mmHg. Low salt diet is encouraged. 3. Long discussion concerning smoking cessation. She is not ready to commit. 4. Followed by primary care. A1c target is less than 6.5. We also discussed.lipid targets which would include an LDL less than or equal to 70. 5. I encouraged reconsideration of therapy for sleep apnea.    Medication Adjustments/Labs and Tests Ordered: Current medicines are reviewed at length with the patient today.  Concerns regarding medicines are outlined above.  Medication changes, Labs and Tests ordered today are listed in the Patient Instructions below. Patient Instructions  Medication Instructions:  None  Labwork: None  Testing/Procedures: Your physician has requested that you have en exercise stress myoview. For further information please visit [email or web address]. Please follow instruction sheet, as given.   Follow-Up: Your physician recommends that you schedule a follow-up  appointment as needed with Dr. Tamala Julian.    Any Other Special Instructions Will Be Listed Below (If Applicable).  Your physician would like for you to [place]!!!!   Steps to Quit Smoking Smoking tobacco can be bad for your health. It can also affect almost every organ in your body. Smoking puts you and people around you at risk for many serious long-lasting (chronic) diseases. Quitting smoking is hard, but it is one of the best things that you can do for your health. It is never too late to quit. What are the benefits of quitting smoking? When you quit smoking, you lower your risk for getting serious diseases and conditions. They can include:  Lung cancer or lung disease.  Heart disease.  Stroke.  Heart attack.  Not being able to have children (infertility).  Weak bones (osteoporosis) and broken bones (fractures). If you have coughing, wheezing, and shortness of breath, those symptoms may get better when you quit. You may also get sick less often. If you are pregnant, quitting smoking can help to lower your chances of having a baby of low birth weight. What can I do to help me quit smoking? Talk with your doctor about what can help you quit smoking. Some things you can do (strategies) include:  Quitting smoking totally, instead of slowly cutting back how much you smoke over a period of time.  Going to in-person counseling. You are more likely to quit if you go to many counseling sessions.  Using resources and support systems, such as:  Online chats with a Social worker.  Phone quitlines.  Printed Furniture conservator/restorer.  Support groups or group counseling.  Text messaging programs.  Mobile phone apps or applications.  Taking medicines. Some of these medicines may have nicotine in them. If you are pregnant or breastfeeding, do not take any medicines to quit smoking unless your doctor says it is okay. Talk with your doctor about counseling or other things that can help  you. Talk with your doctor about using more than one strategy at the same time, such as taking medicines while you are also going to in-person counseling. This can help make quitting easier. What things can I do to make it easier to quit? Quitting smoking might feel very hard at first, but there is a lot that you can do to make it easier. Take these steps:  Talk to your family and friends.  Ask them to support and encourage you.  Call phone quitlines, reach out to support groups, or work with a Social worker.  Ask people who smoke to not smoke around you.  Avoid places that make you want (trigger) to smoke, such as:  Bars.  Parties.  Smoke-break areas at work.  Spend time with people who do not smoke.  Lower the stress in your life. Stress can make you want to smoke. Try these things to help your stress:  Getting regular exercise.  Deep-breathing exercises.  Yoga.  Meditating.  Doing a body scan. To do this, close your eyes, focus on one area of your body at a time from head to toe, and notice which parts of your body are tense. Try to relax the muscles in those areas.  Download or buy apps on your mobile phone or tablet that can help you stick to your quit plan. There are many free apps, such as QuitGuide from the [employer] Office manager for Disease Control and Prevention). You can find more support from smokefree.gov and other websites. This information is not intended to replace advice given to you by your health care provider. Make sure you discuss any questions you have with your health care provider. Document Released: 12/23/2008 Document Revised: 10/25/2015 Document Reviewed: 07/13/2014 Elsevier Interactive Patient Education  2017 [employer].    If you need a refill on your cardiac medications before your next appointment, please call your pharmacy.      Signed, Sinclair Grooms, MD  02/08/2016 10:31 AM    [facility] [address], [place],  [place]  [address] Phone: [phone]; Fax: [phone]

## 2016-02-29 NOTE — Discharge Instructions (Signed)
° °  HOLD JANUMET FOR 48 HOURS. MAY RESUME SAT MORNING.

## 2016-02-29 NOTE — Progress Notes (Signed)
Pt arrived to floor restless and mildly anxious, wanting to get up, eat, and use bathroom.  CBG 134.  Pt more comfortable and less restless after given a few crackers and apple juice.  No difficulty swallowing.  ACT 208, Rt groin and Rt radial sites level 0, sheath remains in groin until ACT<175 per order.  VSS.  Pt denies pain.  Voided 300 cc on bedpan.  Call light in reach. POC discussed with pt and sister.  Discussed Janumet and how MD will hold x 48 hrs post cath to protect kidneys.  Pt voices understanding.   Call light in reach.  Pt voices understanding not to get up and to call for any swelling, pain or wetness from wrist or groin.  Tele 1st degree AVB 60's.

## 2016-02-29 NOTE — Research (Signed)
CADLAD Informed Consent   Subject Name: Leah Norton  Subject met inclusion and exclusion criteria.  The informed consent form, study requirements and expectations were reviewed with the subject and questions and concerns were addressed prior to the signing of the consent form.  The subject verbalized understanding of the trail requirements.  The subject agreed to participate in the CADLAD trial and signed the informed consent.  The informed consent was obtained prior to performance of any protocol-specific procedures for the subject.  A copy of the signed informed consent was given to the subject and a copy was placed in the subject's medical record.  Mable Fill, Marissa Nestle 02/29/2016, 07:33am

## 2016-02-29 NOTE — Progress Notes (Signed)
Site area: right groin  Site Prior to Removal:  Level 0  Pressure Applied For 20 MINUTES    Minutes Beginning at 1340   Manual:   Yes.    Patient Status During Pull:  Tolerated well, dozed off at times  Post Pull Groin Site:  Level 0  Post Pull Instructions Given:  Yes.    Post Pull Pulses Present:  Yes.    Dressing Applied:  Yes.    Comments:  TR band deflation in progress.

## 2016-02-29 NOTE — Interval H&P Note (Signed)
Cath Lab Visit (complete for each Cath Lab visit)  Clinical Evaluation Leading to the Procedure:   ACS: No.  Non-ACS:    Anginal Classification: CCS Norton  Anti-ischemic medical therapy: Maximal Therapy (2 or more classes of medications)  Non-Invasive Test Results: No non-invasive testing performed  Prior CABG: No previous CABG      History and Physical Interval Note:  02/29/2016 8:43 AM  Leah Norton  has presented today for surgery, with the diagnosis of abnormal stress test  The various methods of treatment have been discussed with the patient and family. After consideration of risks, benefits and other options for treatment, the patient has consented to  Procedure(s): Left Heart Cath and Coronary Angiography (N/A) as a surgical intervention .  The patient's history has been reviewed, patient examined, no change in status, stable for surgery.  I have reviewed the patient's chart and labs.  Questions were answered to the patient's satisfaction.     Leah Norton

## 2016-03-01 ENCOUNTER — Telehealth: Payer: Self-pay | Admitting: Interventional Cardiology

## 2016-03-01 ENCOUNTER — Other Ambulatory Visit: Payer: Self-pay | Admitting: *Deleted

## 2016-03-01 ENCOUNTER — Encounter (HOSPITAL_COMMUNITY): Payer: Self-pay | Admitting: Cardiology

## 2016-03-01 DIAGNOSIS — I119 Hypertensive heart disease without heart failure: Secondary | ICD-10-CM | POA: Diagnosis not present

## 2016-03-01 DIAGNOSIS — I2510 Atherosclerotic heart disease of native coronary artery without angina pectoris: Secondary | ICD-10-CM

## 2016-03-01 DIAGNOSIS — I10 Essential (primary) hypertension: Secondary | ICD-10-CM

## 2016-03-01 DIAGNOSIS — F17210 Nicotine dependence, cigarettes, uncomplicated: Secondary | ICD-10-CM | POA: Diagnosis not present

## 2016-03-01 DIAGNOSIS — K5900 Constipation, unspecified: Secondary | ICD-10-CM | POA: Diagnosis not present

## 2016-03-01 DIAGNOSIS — F17200 Nicotine dependence, unspecified, uncomplicated: Secondary | ICD-10-CM

## 2016-03-01 LAB — GLUCOSE, CAPILLARY: Glucose-Capillary: 191 mg/dL — ABNORMAL HIGH (ref 65–99)

## 2016-03-01 LAB — BASIC METABOLIC PANEL
ANION GAP: 8 (ref 5–15)
BUN: 10 mg/dL (ref 6–20)
CO2: 26 mmol/L (ref 22–32)
Calcium: 9.4 mg/dL (ref 8.9–10.3)
Chloride: 104 mmol/L (ref 101–111)
Creatinine, Ser: 0.71 mg/dL (ref 0.44–1.00)
GFR calc Af Amer: >60 mL/min (ref >60)
GLUCOSE: 189 mg/dL — AB (ref 65–99)
POTASSIUM: 4.1 mmol/L (ref 3.5–5.1)
Sodium: 138 mmol/L (ref 135–145)

## 2016-03-01 LAB — CBC
HEMATOCRIT: 36.5 % (ref 36.0–46.0)
Hemoglobin: 11.8 g/dL — ABNORMAL LOW (ref 12.0–15.0)
MCH: 28.9 pg (ref 26.0–34.0)
MCHC: 32.3 g/dL (ref 30.0–36.0)
MCV: 89.5 fL (ref 78.0–100.0)
Platelets: 407 10*3/uL — ABNORMAL HIGH (ref 150–400)
RBC: 4.08 MIL/uL (ref 3.87–5.11)
RDW: 13.6 % (ref 11.5–15.5)
WBC: 7.2 10*3/uL (ref 4.0–10.5)

## 2016-03-01 MED ORDER — NITROGLYCERIN 0.4 MG SL SUBL: 0.4000 mg | SUBLINGUAL_TABLET | SUBLINGUAL | 3 refills | Status: DC | PRN

## 2016-03-01 MED ORDER — TICAGRELOR 90 MG PO TABS: 90.0000 mg | ORAL_TABLET | Freq: Two times a day (BID) | ORAL | 11 refills | Status: DC

## 2016-03-01 MED ORDER — AMBULATORY NON FORMULARY MEDICATION: 81.0000 mg | Freq: Every day | Status: DC

## 2016-03-01 MED ORDER — LOSARTAN POTASSIUM 100 MG PO TABS: 100.0000 mg | ORAL_TABLET | Freq: Every morning | ORAL | 6 refills | Status: DC

## 2016-03-01 MED ORDER — AMBULATORY NON FORMULARY MEDICATION: 90.0000 mg | Freq: Two times a day (BID) | Status: DC

## 2016-03-01 MED ORDER — ATORVASTATIN CALCIUM 80 MG PO TABS: 80.0000 mg | ORAL_TABLET | Freq: Every day | ORAL | 6 refills | Status: DC

## 2016-03-01 NOTE — Research (Signed)
TWILIGHT Informed Consent   Subject Name: Leah Norton  Subject met inclusion and exclusion criteria.  The informed consent form, study requirements and expectations were reviewed with the subject and questions and concerns were addressed prior to the signing of the consent form.  The subject verbalized understanding of the trail requirements.  The subject agreed to participate in the TWILIGHT trial and signed the informed consent.  The informed consent was obtained prior to performance of any protocol-specific procedures for the subject.  A copy of the signed informed consent was given to the subject and a copy was placed in the subject's medical record.  Mable Fill, Marissa Nestle 03/01/2016, 09:30am

## 2016-03-01 NOTE — Progress Notes (Signed)
    Subjective:  Denies CP or dyspnea   Objective:  Vitals:   02/29/16 1530 02/29/16 1937 02/29/16 2000 03/01/16 0520  BP: (!) 169/78 (!) 147/62  (!) 169/79  Pulse: 73 75 76 66  Resp: 19 (!) 22 17 (!) 23  Temp: 98.2 F (36.8 C) 98 F (36.7 C)  98.7 F (37.1 C)  TempSrc: Oral Oral  Oral  SpO2: 98% 99% 96% 98%  Weight:    186 lb 11.7 oz (84.7 kg)  Height:        Intake/Output from previous day:  Intake/Output Summary (Last 24 hours) at 03/01/16 [id number] Last data filed at 02/29/16 2340  Gross per 24 hour  Intake             1695 ml  Output             2300 ml  Net             -605 ml    Physical Exam: Physical exam: Well-developed well-nourished in no acute distress.  Skin is warm and dry.  HEENT is normal.  Neck is supple.  Chest is clear to auscultation with normal expansion.  Cardiovascular exam is regular rate and rhythm.  Abdominal exam nontender or distended. No masses palpated. Extremities show no edema. Radial cath site with no hematoma; right groin with no hematoma and no bruit neuro grossly intact    Lab Results: Basic Metabolic Panel:  Recent Labs  02/28/16 0939 03/01/16 0603  NA 139 138  K 4.1 4.1  CL 102 104  CO2 27 26  GLUCOSE 149* 189*  BUN 12 10  CREATININE 0.74 0.71  CALCIUM 9.7 9.4   CBC:  Recent Labs  02/28/16 0939 03/01/16 0603  WBC 7.7 7.2  HGB 11.9 11.8*  HCT 36.6 36.5  MCV 88.0 89.5  PLT 434* 407*     Assessment/Plan:  1 CAD-s/p PCI of LAD. Continue ASA brilinta and statin.  2 hyperlipidemia-lipitor increased to 80 mg daily; check lipids and liver 4 weeks  3 HTN-BP elevated; increase cozaar to 100 mg daily and follow.  4 Tobacco abuse-pt counseled on discontinuing.   5 DM  DC today and fu with TOC one week and Dr Tamala Julian 6-8 weeks > 30 min PA and physician time D2  Kirk Ruths 03/01/2016, 8:08 AM

## 2016-03-01 NOTE — Discharge Summary (Signed)
Discharge Summary    Patient ID: Leah Norton,  MRN: [id number], DOB/AGE: 60-Apr-1957 60 y.o.  Admit date: 02/29/2016 Discharge date: 03/01/2016  Primary Care Provider: Seward Norton Norton Primary Cardiologist: Leah Norton Norton   Discharge Diagnoses    Principal Problem:   CAD in native artery Active Problems:   TOBACCO ABUSE   Essential hypertension   Abnormal nuclear stress test   CAD (coronary artery disease), native coronary artery   Allergies Allergies  Allergen Reactions  . Codeine     REACTION: Rash  . Erythromycin     REACTION: Rash  . Penicillins     REACTION: Rash Has patient had a PCN reaction causing immediate rash, facial/tongue/throat swelling, SOB or lightheadedness with hypotension: Yes Has patient had a PCN reaction causing severe rash involving mucus membranes or skin necrosis: No Has patient had a PCN reaction that required hospitalization No Has patient had a PCN reaction occurring within the last 10 years: Yes If all of the above answers are "NO", then may proceed with Cephalosporin use.   . Sulfonamide Derivatives     REACTION: rash  . Vicodin [Hydrocodone-Acetaminophen] Rash    Diagnostic Studies/Procedures    LHC: 02/29/16  Conclusion    Coronary artery disease with eccentric 70-80% proximal LAD stenosis. Noncritical 65% mid circumflex, 50% first diagonal, and 70% ramus intermedius.  Abnormal intermediate risk myocardial perfusion study with anteroapical perfusion abnormality car laced with the proximal LAD stenosis.  Successful stenting of the proximal LAD from 75% to 0% with TIMI grade 3 flow using a 16 x 3.5 Promus Premier post dilated to 3.75 mm in diameter.  Normal LV function with EF 60%. Mild elevation and end-diastolic pressure at 19 mmHg.  RECOMMENDATIONS:   Increase the intensity of statin therapy.  Aspirin and Brilinta for at least 6 months.  Risk factor modification.  Anticipate discharge in a.m. if no groin  complications.   _____________   History of Present Illness     Leah Norton is a 60 y.o. female with history of diabetes, hyper-lipidemia, hypertension, tobacco abuse, nonobstructive coronary disease by angiography in 2005.  She was referred by Leah Norton for cardiac evaluation. She denied chest discomfort. A recent chest CT because of hemoptysis reveal significant three-vessel coronary calcification. She denied exertional dyspnea and chest pain. She does have sleep apnea that is not being treated. She denies claudication. She has had a previous right ankle fracture that prevents significant aerobic activity. She denies orthopnea, PND, palpitations, and syncope.  She underwent lexiscan myoview that was abnormal showing medium defect of moderate severity present in the mid anterior, apical anterior, apical septal and apex location. Given this finding, she was planned for cardiac catheterization.   Hospital Course     Consultants: None  She presented on 12/20 with Leah Norton Norton for cardiac cath showing 70-80% pLAD stenosis treated with DEA x1, 65% in mLCx, 50% in first diag, and 70% in ramus. Her statin therapy was increased. She will be placed on DAPT with ASA and Brilinta for at least 6 months. EF reported at 60%.   Her labs were stable post procedure. She worked well with cardiac rehab. Her blood pressure was elevated during admission, and her cozaar was increased to 100mg  daily. She was counseling on tobacco cessation. She did choose to enroll in the [place] study, as was given her Rx for Brilinta, and ASA prior to discharge.   She was seen by Dr. Stanford Norton on 12/21 and determined stable for  discharge home. I have arranged for follow up in the office with an APP.  _____________  Discharge Vitals Blood pressure (!) 181/90, pulse 73, temperature 98 F (36.7 C), temperature source Oral, resp. rate 14, height 5' 6.5" (1.689 m), weight 186 lb 11.7 oz (84.7 kg), SpO2 98 %.  Filed  Weights   02/29/16 0639 03/01/16 0520  Weight: 185 lb (83.9 kg) 186 lb 11.7 oz (84.7 kg)    Labs & Radiologic Studies    CBC  Recent Labs  02/28/16 0939 03/01/16 0603  WBC 7.7 7.2  HGB 11.9 11.8*  HCT 36.6 36.5  MCV 88.0 89.5  PLT 434* [id number]   Basic Metabolic Panel  Recent Labs  02/28/16 0939 03/01/16 0603  NA 139 138  K 4.1 4.1  CL 102 104  CO2 27 26  GLUCOSE 149* 189*  BUN 12 10  CREATININE 0.74 0.71  CALCIUM 9.7 9.4   Liver Function Tests No results for input(s): AST, ALT, ALKPHOS, BILITOT, PROT, ALBUMIN in the last 72 hours. No results for input(s): LIPASE, AMYLASE in the last 72 hours. Cardiac Enzymes No results for input(s): CKTOTAL, CKMB, CKMBINDEX, TROPONINI in the last 72 hours. BNP Invalid input(s): POCBNP Norton-Dimer No results for input(s): DDIMER in the last 72 hours. Hemoglobin A1C No results for input(s): HGBA1C in the last 72 hours. Fasting Lipid Panel No results for input(s): CHOL, HDL, LDLCALC, TRIG, CHOLHDL, LDLDIRECT in the last 72 hours. Thyroid Function Tests No results for input(s): TSH, T4TOTAL, T3FREE, THYROIDAB in the last 72 hours.  Invalid input(s): FREET3 _____________  No results found. Disposition   Pt is being discharged home today in good condition.  Follow-up Plans & Appointments    Follow-up Information    Leah Kicks, NP Follow up on 03/09/2016.   Specialties:  Cardiology, Radiology Why:  11:15am for your follow up appt.  Contact information: [place] [id number] [phone]          Discharge Instructions    Amb Referral to Cardiac Rehabilitation    Complete by:  As directed    Diagnosis:  Coronary Stents   Diet - low sodium heart healthy    Complete by:  As directed    Discharge instructions    Complete by:  As directed    Groin Site Care Refer to this sheet in the next few weeks. These instructions provide you with information on caring for yourself after your procedure.  Your caregiver may also give you more specific instructions. Your treatment has been planned according to current medical practices, but problems sometimes occur. Call your caregiver if you have any problems or questions after your procedure. HOME CARE INSTRUCTIONS You may shower 24 hours after the procedure. Remove the bandage (dressing) and gently wash the site with plain soap and water. Gently pat the site dry.  Do not apply powder or lotion to the site.  Do not sit in a bathtub, swimming pool, or whirlpool for 5 to 7 days.  No bending, squatting, or lifting anything over 10 pounds (4.5 kg) as directed by your caregiver.  Inspect the site at least twice daily.  Do not drive home if you are discharged the same day of the procedure. Have someone else drive you.  You may drive 24 hours after the procedure unless otherwise instructed by your caregiver.  What to expect: Any bruising will usually fade within 1 to 2 weeks.  Blood that collects in the tissue (hematoma) may be  painful to the touch. It should usually decrease in size and tenderness within 1 to 2 weeks.  SEEK IMMEDIATE MEDICAL CARE IF: You have unusual pain at the groin site or down the affected leg.  You have redness, warmth, swelling, or pain at the groin site.  You have drainage (other than a small amount of blood on the dressing).  You have chills.  You have a fever or persistent symptoms for more than 72 hours.  You have a fever and your symptoms suddenly get worse.  Your leg becomes pale, cool, tingly, or numb.  You have heavy bleeding from the site. Hold pressure on the site. .   Radial Site Care Refer to this sheet in the next few weeks. These instructions provide you with information on caring for yourself after your procedure. Your caregiver may also give you more specific instructions. Your treatment has been planned according to current medical practices, but problems sometimes occur. Call your caregiver if you have any  problems or questions after your procedure. HOME CARE INSTRUCTIONS You may shower the day after the procedure.Remove the bandage (dressing) and gently wash the site with plain soap and water.Gently pat the site dry.  Do not apply powder or lotion to the site.  Do not submerge the affected site in water for 3 to 5 days.  Inspect the site at least twice daily.  Do not flex or bend the affected arm for 24 hours.  No lifting over 5 pounds (2.3 kg) for 5 days after your procedure.  Do not drive home if you are discharged the same day of the procedure. Have someone else drive you.  You may drive 24 hours after the procedure unless otherwise instructed by your caregiver.  What to expect: Any bruising will usually fade within 1 to 2 weeks.  Blood that collects in the tissue (hematoma) may be painful to the touch. It should usually decrease in size and tenderness within 1 to 2 weeks.  SEEK IMMEDIATE MEDICAL CARE IF: You have unusual pain at the radial site.  You have redness, warmth, swelling, or pain at the radial site.  You have drainage (other than a small amount of blood on the dressing).  You have chills.  You have a fever or persistent symptoms for more than 72 hours.  You have a fever and your symptoms suddenly get worse.  Your arm becomes pale, cool, tingly, or numb.  You have heavy bleeding from the site. Hold pressure on the site.   Increase activity slowly    Complete by:  As directed       Discharge Medications   Current Discharge Medication List    START taking these medications   Details  atorvastatin (LIPITOR) 80 MG tablet Take 1 tablet (80 mg total) by mouth daily at 6 PM. Qty: 30 tablet, Refills: 6    nitroGLYCERIN (NITROSTAT) 0.4 MG SL tablet Place 1 tablet (0.4 mg total) under the tongue every 5 (five) minutes as needed. Qty: 25 tablet, Refills: 3    ticagrelor (BRILINTA) 90 MG TABS tablet Take 1 tablet (90 mg total) by mouth 2 (two) times daily. Qty: 60 tablet,  Refills: 11      CONTINUE these medications which have CHANGED   Details  losartan (COZAAR) 100 MG tablet Take 1 tablet (100 mg total) by mouth every morning. Qty: 30 tablet, Refills: 6      CONTINUE these medications which have NOT CHANGED   Details  Ascorbic Acid (VITAMIN C  PO) Take 1 tablet by mouth daily.    aspirin EC 81 MG tablet Take 81 mg by mouth daily.    atenolol (TENORMIN) 50 MG tablet Take 50 mg by mouth daily.     buPROPion (WELLBUTRIN SR) 150 MG 12 hr tablet Take 150 mg by mouth daily.     Cholecalciferol (VITAMIN Norton) 2000 units CAPS Take 2,000 Units by mouth daily.    hydrochlorothiazide (HYDRODIURIL) 25 MG tablet Take 25 mg by mouth every morning.     isosorbide mononitrate (IMDUR) 30 MG 24 hr tablet Take 30 mg by mouth every morning.     sitaGLIPtin-metformin (JANUMET) 50-1000 MG per tablet Take 1 tablet by mouth 2 (two) times daily with a meal.    albuterol (PROVENTIL HFA;VENTOLIN HFA) 108 (90 BASE) MCG/ACT inhaler Inhale 1-2 puffs into the lungs every 6 (six) hours as needed for wheezing. Qty: 1 Inhaler, Refills: 0      STOP taking these medications     pravastatin (PRAVACHOL) 40 MG tablet          Aspirin prescribed at discharge?  Yes High Intensity Statin Prescribed? (Lipitor 40-80mg  or Crestor 20-40mg ): Yes Beta Blocker Prescribed? Yes For EF <40%, was ACEI/ARB Prescribed? Yes ADP Receptor Inhibitor Prescribed? (i.e. Plavix etc.-Includes Medically Managed Patients): Yes For EF <40%, Aldosterone Inhibitor Prescribed? No: EF ok Was EF assessed during THIS hospitalization? Yes Was Cardiac Rehab II ordered? (Included Medically managed Patients): Yes   Outstanding Labs/Studies   BMET at follow up appt. FLP and LFTs in 6 weeks  Duration of Discharge Encounter   Greater than 30 minutes including physician time.  Signed, Reino Bellis NP-C 03/01/2016, 10:23 AM

## 2016-03-01 NOTE — Progress Notes (Signed)
CARDIAC REHAB PHASE I   PRE:  Rate/Rhythm: 71 SR  BP:  Sitting: 181/90        SaO2: 98 RA  MODE:  Ambulation: 500 ft   POST:  Rate/Rhythm: 81 SR  BP:  Sitting: 194/81         SaO2: 98 RA  Pt ambulated 500 ft on RA, handheld assist, mostly steady gait, tolerated well with no complaints. Completed PCI/stent education.  Reviewed risk factors, tobacco cessation (gave pt fake cigarette), anti-platelet therapy, stent card, activity restrictions, ntg, exercise, heart healthy diet, carb counting, portion control, and phase 2 cardiac rehab. Pt verbalized understanding. Pt agrees to phase 2 cardiac rehab referral, will send to [facility] per pt request. Pt to recliner after walk, call bell within reach.   [place], RN, BSN 03/01/2016 9:22 AM

## 2016-03-01 NOTE — Telephone Encounter (Signed)
TCM appt    12/29 [facility] @ 11:30am

## 2016-03-02 NOTE — Telephone Encounter (Signed)
New message ° ° ° °Pt verbalized that she is returning call  °

## 2016-03-02 NOTE — Telephone Encounter (Signed)
Patient contacted regarding discharge from [facility] on 03/01/16  Patient understands to follow up with provider ingold on 03/09/16 at 11:30 am at [address] Patient understands discharge instructions? yes Patient understands medications and regiment? yes Patient understands to bring all medications to this visit? yes  Patient reports bruising across abdomen. Reassurance given to the patient that bruising is normal after having a cath. Her stick site is fine with no knots or oozing.

## 2016-03-02 NOTE — Telephone Encounter (Signed)
Left message for patient to call back TCM f/u with Cecilie Kicks on 12/29 at 11:30

## 2016-03-02 NOTE — Telephone Encounter (Signed)
Unable to reach pt or leave a message  

## 2016-03-08 NOTE — Progress Notes (Signed)
Cardiology Office Note   Date:  03/09/2016   ID:  Leah Norton, DOB [birth year], MRN [id number]  PCP:  Kandice Hams, MD  Cardiologist:  Dr. Tamala Julian    Chief Complaint  Patient presents with  . Hospitalization Follow-up      History of Present Illness: Leah Norton is a 60 y.o. female who presents for post hospitalization  She has a history of diabetes, hyper-lipidemia, hypertension, tobacco abuse, nonobstructive coronary disease by angiography in 2005. A recent chest CT because of hemoptysis reveal significant three-vessel coronary calcification. She denied exertional dyspnea and chest pain. She does have sleep apnea that is not being treated. She denies claudication. She has had a previous right ankle fracture that prevents significant aerobic activity. She denies orthopnea, PND, palpitations, and syncope.  She underwent lexiscan myoview that was abnormal showing medium defect of moderate severity present in the mid anterior, apical anterior, apical septal and apex location. Given this finding, she was planned for cardiac catheterization  12/20 with Dr. Tamala Julian for cardiac cath showing 70-80% pLAD stenosis treated with DEA x1, 65% in mLCx, 50% in first diag, and 70% in ramus. Her statin therapy was increased. She will be placed on DAPT with ASA and Brilinta for at least 6 months. EF reported at 60%.   BMET at follow up and hepatic and lipids in 6 weeks  Today she does complain of increased exertion. Has been using a lot of pepto bismal.  Which has helped.  Also discussed medications in detail..  She has been having dental work done and  Was taking ibuprofen but have asked to take tylenol instead.    She has been trying to eat better.  She is concerned about work, has been very tired at the end of day and would like to be off work to recover.  While off she will be set up with cardiac rehab.     She has not missed her Brilinta or ASA.     Past Medical History:    Diagnosis Date  . Anginal pain ([employer])   . Arthritis    "right anikle" (02/29/2016)  . CAD in native artery 02/08/2016   Three-vessel coronary artery calcification on CT scan  . Complication of anesthesia    "had reaction w/hives, rash, itching" (02/29/2016)  . Constipation   . Coronary artery disease    12/17 PCI with DESx1 to pLAD EF normal  . Enlarged heart   . GERD (gastroesophageal reflux disease)   . High cholesterol   . Hypertension   . Migraine    hx  . Type II diabetes mellitus ([place])     Past Surgical History:  Procedure Laterality Date  . ABDOMINAL HYSTERECTOMY    . CARDIAC CATHETERIZATION N/A 02/29/2016   Procedure: Left Heart Cath and Coronary Angiography;  Surgeon: Belva Crome, MD;  Location: [facility];  Service: Cardiovascular;  Laterality: N/A;  . CARDIAC CATHETERIZATION N/A 02/29/2016   Procedure: Coronary Stent Intervention;  Surgeon: Belva Crome, MD;  Location: [facility];  Service: Cardiovascular;  Laterality: N/A;  . CARDIAC CATHETERIZATION  2005   "no blockages"  . COLONOSCOPY WITH PROPOFOL N/A 03/15/2014   Procedure: COLONOSCOPY WITH PROPOFOL;  Surgeon: Garlan Fair, MD;  Location: [facility];  Service: Endoscopy;  Laterality: N/A;  . FRACTURE SURGERY    . MULTIPLE TOOTH EXTRACTIONS     teeth removed  . ORIF ANKLE FRACTURE Right 11/12/2012   Procedure: OPEN REDUCTION INTERNAL FIXATION (  ORIF) ANKLE FRACTURE;  Surgeon: Marianna Payment, MD;  Location: [facility];  Service: Orthopedics;  Laterality: Right;  Open reduction internal fixation ankle fracture, trimalleolar     Current Outpatient Prescriptions  Medication Sig Dispense Refill  . albuterol (PROVENTIL HFA;VENTOLIN HFA) 108 (90 BASE) MCG/ACT inhaler Inhale 1-2 puffs into the lungs every 6 (six) hours as needed for wheezing. 1 Inhaler 0  . AMBULATORY NON FORMULARY MEDICATION Take 90 mg by mouth 2 (two) times daily. Medication Name: BRILINTA 90 mg BID ([facility] study  PROVIDED)    . AMBULATORY NON FORMULARY MEDICATION Take 81 mg by mouth daily. Medication Name: ASPIRIN 81 mg Daily ([facility] study PROVIDED)    . Ascorbic Acid (VITAMIN C PO) Take 1 tablet by mouth daily.    Marland Kitchen atenolol (TENORMIN) 50 MG tablet Take 50 mg by mouth daily.     Marland Kitchen atorvastatin (LIPITOR) 80 MG tablet Take 1 tablet (80 mg total) by mouth daily at 6 PM. 30 tablet 6  . buPROPion (WELLBUTRIN SR) 150 MG 12 hr tablet Take 150 mg by mouth daily.     . Cholecalciferol (VITAMIN D) 2000 units CAPS Take 2,000 Units by mouth daily.    . hydrochlorothiazide (HYDRODIURIL) 25 MG tablet Take 25 mg by mouth every morning.     . isosorbide mononitrate (IMDUR) 30 MG 24 hr tablet Take 30 mg by mouth every morning.     Marland Kitchen losartan (COZAAR) 100 MG tablet Take 1 tablet (100 mg total) by mouth every morning. 30 tablet 6  . nitroGLYCERIN (NITROSTAT) 0.4 MG SL tablet Place 1 tablet (0.4 mg total) under the tongue every 5 (five) minutes as needed. 25 tablet 3  . sitaGLIPtin-metformin (JANUMET) 50-1000 MG per tablet Take 1 tablet by mouth 2 (two) times daily with a meal.    . pantoprazole (PROTONIX) 40 MG tablet Take 1 tablet (40 mg total) by mouth daily. 30 tablet 11   No current facility-administered medications for this visit.     Allergies:   Codeine; Erythromycin; Penicillins; Sulfonamide derivatives; and Vicodin [hydrocodone-acetaminophen]    Social History:  The patient  reports that she has been smoking Cigarettes.  She has a 10.00 pack-year smoking history. She has never used smokeless tobacco. She reports that she drinks alcohol. She reports that she does not use drugs.   Family History:  The patient's family history includes Hypertension in her mother.    ROS:  General:no colds or fevers, no weight changes Skin:no rashes or ulcers HEENT:no blurred vision, no congestion CV:see HPI PUL:see HPI GI:no diarrhea constipation or melena, no indigestion GU:no hematuria, no dysuria MS:no joint  pain, no claudication Neuro:no syncope, no lightheadedness Endo:+ diabetes stable, no thyroid disease  Wt Readings from Last 3 Encounters:  03/09/16 194 lb (88 kg)  03/01/16 186 lb 11.7 oz (84.7 kg)  02/15/16 191 lb (86.6 kg)     PHYSICAL EXAM: VS:  BP 120/66 (BP Location: Left Arm)   Pulse 87   Ht 5\' 6"  (1.676 m)   Wt 194 lb (88 kg)   BMI 31.31 kg/m  , BMI Body mass index is 31.31 kg/m. General:Pleasant affect, NAD Skin:Warm and dry, brisk capillary refill, abd bruise Rt mid abd rom heparin or lovenox injection.   HEENT:normocephalic, sclera clear, mucus membranes moist Neck:supple, no JVD, no bruits  Heart:S1S2 RRR without murmur, gallup, rub or click Lungs:clear without rales, rhonchi, or wheezes [id number], non tender, + BS, do not palpate liver spleen or masses Ext:no lower ext  edema, 2+ pedal pulses, 2+ radial pulses, rt groin cath site stable without hematoma. Neuro:alert and oriented, MAE, follows commands, + facial symmetry    EKG:  EKG is ordered today. The ekg ordered today demonstrates SR no acute changes    Recent Labs: 03/01/2016: BUN 10; Creatinine, Ser 0.71; Hemoglobin 11.8; Platelets 407; Potassium 4.1; Sodium 138    Lipid Panel    Component Value Date/Time   CHOL 153 12/23/2008 2225   TRIG 198 (H) 12/23/2008 2225   HDL 33 (L) 12/23/2008 2225   CHOLHDL 4.6 Ratio 12/23/2008 2225   VLDL 40 12/23/2008 2225   LDLCALC 80 12/23/2008 2225       Other studies Reviewed: Additional studies/ records that were reviewed today include: . Cardiac cath: Narrative & Impression     Coronary artery disease with eccentric 70-80% proximal LAD stenosis. Noncritical 65% mid circumflex, 50% first diagonal, and 70% ramus intermedius.  Abnormal intermediate risk myocardial perfusion study with anteroapical perfusion abnormality car laced with the proximal LAD stenosis.  Successful stenting of the proximal LAD from 75% to 0% with TIMI grade 3 flow using a 16 x 3.5  Promus Premier post dilated to 3.75 mm in diameter.  Normal LV function with EF 60%. Mild elevation and end-diastolic pressure at 19 mmHg.  RECOMMENDATIONS:   Increase the intensity of statin therapy.  Aspirin and Brilinta for at least 6 months.  Risk factor modification.  Anticipate discharge in a.m. if no groin complications.       ASSESSMENT AND PLAN:  1.  CAD with stent DES to proximal LAD.  residual disease to the [facility], 1st diag and ramus.  EF 60%. Continue brilinta, asa, statin follow up with Dr. Tamala Julian or APP in 6 weeks.  2. HTN continue cozaar and atenolol  3. HLD continue lipitor and check hepatic and chol in 6 weeks.  4. GERD is worse, will add protonix   5. Continues to smoke  She is having trouble at work with ambulating will keep out of work until 03/26/16 then may return.   Ok to begin cardiac rehab. Phase II  Current medicines are reviewed with the patient today.  The patient Has no concerns regarding medicines.  The following changes have been made:  See above Labs/ tests ordered today include:see above  Disposition:   FU:  see above  Signed, Cecilie Kicks, NP  03/09/2016 12:41 PM    [facility], [address] [place], [place] Phone: [phone]; Fax: [phone]

## 2016-03-09 ENCOUNTER — Ambulatory Visit (INDEPENDENT_AMBULATORY_CARE_PROVIDER_SITE_OTHER): Payer: BC Managed Care – PPO | Admitting: Cardiology

## 2016-03-09 ENCOUNTER — Encounter: Payer: Self-pay | Admitting: Cardiology

## 2016-03-09 VITALS — BP 120/66 | HR 87 | Ht 66.0 in | Wt 194.0 lb

## 2016-03-09 DIAGNOSIS — E118 Type 2 diabetes mellitus with unspecified complications: Secondary | ICD-10-CM

## 2016-03-09 DIAGNOSIS — I2510 Atherosclerotic heart disease of native coronary artery without angina pectoris: Secondary | ICD-10-CM | POA: Diagnosis not present

## 2016-03-09 DIAGNOSIS — I10 Essential (primary) hypertension: Secondary | ICD-10-CM

## 2016-03-09 DIAGNOSIS — E1165 Type 2 diabetes mellitus with hyperglycemia: Secondary | ICD-10-CM

## 2016-03-09 DIAGNOSIS — E785 Hyperlipidemia, unspecified: Secondary | ICD-10-CM | POA: Diagnosis not present

## 2016-03-09 DIAGNOSIS — F17200 Nicotine dependence, unspecified, uncomplicated: Secondary | ICD-10-CM | POA: Diagnosis not present

## 2016-03-09 DIAGNOSIS — IMO0002 Reserved for concepts with insufficient information to code with codable children: Secondary | ICD-10-CM

## 2016-03-09 MED ORDER — PANTOPRAZOLE SODIUM 40 MG PO TBEC: 40.0000 mg | DELAYED_RELEASE_TABLET | Freq: Every day | ORAL | 11 refills | Status: DC

## 2016-03-09 NOTE — Patient Instructions (Addendum)
Medication Instructions:  Your physician has recommended you make the following change in your medication:  1.  START Protonix 40 mg taking 1 tablet daily  Labwork: TODAY:  BMET  6 WEEKS:  FASTING LIPID & HEPATIC (SAME DAY AS F/U APPT IS FINE)  Testing/Procedures: None ordered  Follow-Up: Your physician recommends that you schedule a follow-up appointment in: 4-6 [place] HIS CARE TEAM (CAN HAVE LABS DONE ON THE SAME DAY IS FINE)  Any Other Special Instructions Will Be Listed Below (If Applicable).   If you need a refill on your cardiac medications before your next appointment, please call your pharmacy.

## 2016-03-10 LAB — BASIC METABOLIC PANEL
BUN: 12 mg/dL (ref 7–25)
CO2: 22 mmol/L (ref 20–31)
Calcium: 9.6 mg/dL (ref 8.6–10.4)
Chloride: 102 mmol/L (ref 98–110)
Creat: 0.66 mg/dL (ref 0.50–0.99)
GLUCOSE: 162 mg/dL — AB (ref 65–99)
POTASSIUM: 4.2 mmol/L (ref 3.5–5.3)
SODIUM: 140 mmol/L (ref 135–146)

## 2016-03-10 LAB — LIPID PANEL
CHOL/HDL RATIO: 3.8 ratio (ref <5.0)
CHOLESTEROL: 130 mg/dL (ref <200)
HDL: 34 mg/dL — AB (ref >50)
LDL Cholesterol: 55 mg/dL (ref <100)
Triglycerides: 203 mg/dL — ABNORMAL HIGH (ref <150)
VLDL: 41 mg/dL — ABNORMAL HIGH (ref <30)

## 2016-03-10 LAB — HEPATIC FUNCTION PANEL
ALBUMIN: 4.2 g/dL (ref 3.6–5.1)
ALK PHOS: 103 U/L (ref 33–130)
ALT: 21 U/L (ref 6–29)
AST: 21 U/L (ref 10–35)
BILIRUBIN TOTAL: 0.2 mg/dL (ref 0.2–1.2)
Bilirubin, Direct: 0 mg/dL (ref <=0.2)
Indirect Bilirubin: 0.2 mg/dL (ref 0.2–1.2)
Total Protein: 7.3 g/dL (ref 6.1–8.1)

## 2016-03-23 ENCOUNTER — Telehealth: Payer: Self-pay | Admitting: Interventional Cardiology

## 2016-03-23 ENCOUNTER — Telehealth: Payer: Self-pay | Admitting: *Deleted

## 2016-03-23 ENCOUNTER — Encounter: Payer: Self-pay | Admitting: *Deleted

## 2016-03-23 DIAGNOSIS — I2510 Atherosclerotic heart disease of native coronary artery without angina pectoris: Secondary | ICD-10-CM

## 2016-03-23 DIAGNOSIS — Z006 Encounter for examination for normal comparison and control in clinical research program: Secondary | ICD-10-CM

## 2016-03-23 NOTE — Telephone Encounter (Signed)
Returned call to patient left message on personal voice mail will send message to [place] for cardiac rehab order.

## 2016-03-23 NOTE — Telephone Encounter (Signed)
Left message for patient to call research office for month 1 [facility] follow up visit.

## 2016-03-23 NOTE — Telephone Encounter (Signed)
New Message:   Pt would like to know when can she start Cardiac Rehab?

## 2016-03-23 NOTE — Progress Notes (Signed)
TWIL[facility] Research study month 1 telephone follow up completed. Patient denies any bleeding or other adverse events. She states she has been compliant with research provided medication. Next research required visit will be no later than 3/APR/2018. The research office will call and schedule. Questions encouraged and answered.

## 2016-03-24 NOTE — Telephone Encounter (Signed)
Please enroll in cardiac rehab. Diagnosis is CAD with recent LAD stent.

## 2016-03-26 NOTE — Telephone Encounter (Signed)
Order entered for cardiac rehab.

## 2016-03-30 ENCOUNTER — Telehealth (HOSPITAL_COMMUNITY): Payer: Self-pay | Admitting: Internal Medicine

## 2016-03-30 NOTE — Telephone Encounter (Signed)
S/w Laquina H. From [facility] verifying benefits for pt for Cardiac Rehab. Primary Insurance is BCBS with $1,250.00 Deductible, 20% co-insurance and Out of Pocket $4,350.00, with nothing met. No limit of visits.... Reference # [id number]... KJ

## 2016-04-09 ENCOUNTER — Telehealth (HOSPITAL_COMMUNITY): Payer: Self-pay | Admitting: Cardiac Rehabilitation

## 2016-04-09 NOTE — Telephone Encounter (Signed)
pc received from pt with request to cancel cardiac  rehab appointments due to high insurance copay. Pt offered cardiac maintenance program however pt uninterested in that program.

## 2016-04-10 ENCOUNTER — Inpatient Hospital Stay (HOSPITAL_COMMUNITY): Admission: RE | Admit: 2016-04-10 | Payer: BC Managed Care – PPO | Source: Ambulatory Visit

## 2016-04-10 NOTE — Progress Notes (Signed)
Cardiology Office Note   Date:  04/11/2016   ID:  Leah Norton, DOB 1955/06/10, MRN [id number]  PCP:  Kandice Hams, MD  Cardiologist:  Dr. Tamala Julian    Chief Complaint  Patient presents with  . Coronary Artery Disease  . Shortness of Breath      History of Present Illness: Leah Norton is a 61 y.o. female who presents for CAD with Stent placement and now with SOB.   She has a history of diabetes, hyper-lipidemia, hypertension, tobacco abuse, nonobstructive coronary disease by angiography in 2005.  A recent chest CT because of hemoptysis reveal significant three-vessel coronary calcification. She deniedexertional dyspnea and chest pain. She does have sleep apnea that is not being treated. She denies claudication. She has had a previous right ankle fracture that prevents significant aerobic activity. She denies orthopnea, PND, palpitations, and syncope.  She underwent lexiscan myoview that was abnormal showing medium defect of moderate severity present in the mid anterior, apical anterior, apical septal and apex location. Given this finding, she was planned for cardiac catheterization  12/20 with Dr. Tamala Julian for cardiac cath showing 70-80% pLAD stenosis treated with DEA x1, 65% in mLCx, 50% in first diag, and 70% in ramus. Her statin therapy was increased. She will be placed on DAPT with ASA and Brilinta for at least 6 months. EF reported at 60%.   Last visit she was doing well without chest pain or SOB,  Today she has SOB occurs randomly does not last for long periods  But is very bothersome.  No chest pain.  She has no edema.  Her BP is elevated today but she has not yet had medications.   She is back at work, she would like to attend cardiac rehab but her insurance told her if it was in hospital setting then she could not do.  She will call back and tell them it is outpt setting.  Her SOB is very bothersome.  No chest pain.  But the SOB is now worse than before procedure.    Does not occur at night.  No swelling.    Has decreased tobacco down to 1-2 cigarettes per day.    Past Medical History:  Diagnosis Date  . Anginal pain ([place])   . Arthritis    "right anikle" (02/29/2016)  . CAD in native artery 02/08/2016   Three-vessel coronary artery calcification on CT scan  . Complication of anesthesia    "had reaction w/hives, rash, itching" (02/29/2016)  . Constipation   . Coronary artery disease    12/17 PCI with DESx1 to pLAD EF normal  . Enlarged heart   . GERD (gastroesophageal reflux disease)   . High cholesterol   . Hypertension   . Migraine    hx  . Type II diabetes mellitus ([place])     Past Surgical History:  Procedure Laterality Date  . ABDOMINAL HYSTERECTOMY    . CARDIAC CATHETERIZATION N/A 02/29/2016   Procedure: Left Heart Cath and Coronary Angiography;  Surgeon: Belva Crome, MD;  Location: [place] [facility];  Service: Cardiovascular;  Laterality: N/A;  . CARDIAC CATHETERIZATION N/A 02/29/2016   Procedure: Coronary Stent Intervention;  Surgeon: Belva Crome, MD;  Location: [place] [facility];  Service: Cardiovascular;  Laterality: N/A;  . CARDIAC CATHETERIZATION  2005   "no blockages"  . COLONOSCOPY WITH PROPOFOL N/A 03/15/2014   Procedure: COLONOSCOPY WITH PROPOFOL;  Surgeon: Garlan Fair, MD;  Location: [facility];  Service: Endoscopy;  Laterality:  N/A;  . FRACTURE SURGERY    . MULTIPLE TOOTH EXTRACTIONS     teeth removed  . ORIF ANKLE FRACTURE Right 11/12/2012   Procedure: OPEN REDUCTION INTERNAL FIXATION (ORIF) ANKLE FRACTURE;  Surgeon: Marianna Payment, MD;  Location: [facility];  Service: Orthopedics;  Laterality: Right;  Open reduction internal fixation ankle fracture, trimalleolar     Current Outpatient Prescriptions  Medication Sig Dispense Refill  . albuterol (PROVENTIL HFA;VENTOLIN HFA) 108 (90 BASE) MCG/ACT inhaler Inhale 1-2 puffs into the lungs every 6 (six) hours as needed for wheezing. 1 Inhaler 0  . AMBULATORY NON  FORMULARY MEDICATION Take 81 mg by mouth daily. Medication Name: ASPIRIN 81 mg Daily (TWILIGHT Research study PROVIDED)    . Ascorbic Acid (VITAMIN C PO) Take 1 tablet by mouth daily.    Marland Kitchen atenolol (TENORMIN) 50 MG tablet Take 50 mg by mouth daily.     Marland Kitchen atorvastatin (LIPITOR) 80 MG tablet Take 1 tablet (80 mg total) by mouth daily at 6 PM. 30 tablet 6  . buPROPion (WELLBUTRIN SR) 150 MG 12 hr tablet Take 150 mg by mouth daily.     . Cholecalciferol (VITAMIN D) 2000 units CAPS Take 2,000 Units by mouth daily.    . hydrochlorothiazide (HYDRODIURIL) 25 MG tablet Take 25 mg by mouth every morning.     . isosorbide mononitrate (IMDUR) 30 MG 24 hr tablet Take 30 mg by mouth every morning.     Marland Kitchen losartan (COZAAR) 100 MG tablet Take 1 tablet (100 mg total) by mouth every morning. 30 tablet 6  . nitroGLYCERIN (NITROSTAT) 0.4 MG SL tablet Place 1 tablet (0.4 mg total) under the tongue every 5 (five) minutes as needed. 25 tablet 3  . pantoprazole (PROTONIX) 40 MG tablet Take 1 tablet (40 mg total) by mouth daily. 30 tablet 11  . sitaGLIPtin-metformin (JANUMET) 50-1000 MG per tablet Take 1 tablet by mouth 2 (two) times daily with a meal.    . clopidogrel (PLAVIX) 75 MG tablet TAKE 2 TABLETS BY MOUTH ON DAY 1 THEN TAKE 1 TABLET BY MOUTH DAILY THEREAFTER 91 tablet 3   No current facility-administered medications for this visit.     Allergies:   Codeine; Erythromycin; Penicillins; Sulfonamide derivatives; and Vicodin [hydrocodone-acetaminophen]    Social History:  The patient  reports that she has been smoking Cigarettes.  She has a 10.00 pack-year smoking history. She has never used smokeless tobacco. She reports that she drinks alcohol. She reports that she does not use drugs.   Family History:  The patient's family history includes Hypertension in her mother.    ROS:  General:no colds or fevers, no weight changes Skin:no rashes or ulcers HEENT:no blurred vision, no congestion CV:see HPI PUL:see  HPI GI:no diarrhea, + constipation no melena, no indigestion GU:no hematuria, no dysuria MS:no joint pain, no claudication Neuro:no syncope, no lightheadedness Endo:+ diabetes, no thyroid disease  Wt Readings from Last 3 Encounters:  04/11/16 192 lb (87.1 kg)  03/09/16 194 lb (88 kg)  03/01/16 186 lb 11.7 oz (84.7 kg)     PHYSICAL EXAM: VS:  BP (!) 146/76   Pulse 88   Ht 5\' 6"  (1.676 m)   Wt 192 lb (87.1 kg)   BMI 30.99 kg/m  , BMI Body mass index is 30.99 kg/m. General:Pleasant affect, NAD Skin:Warm and dry, brisk capillary refill HEENT:normocephalic, sclera clear, mucus membranes moist Neck:supple, no JVD, no bruits  Heart:S1S2 RRR with [id number] systolic murmur, no gallup, rub or click Lungs:clear without  rales, rhonchi, or wheezes [id number], non tender, + BS, do not palpate liver spleen or masses Ext:no lower ext edema, 2+ pedal pulses, 2+ radial pulses, brace on Rt ankle Neuro:alert and oriented X 3, MAE, follows commands, + facial symmetry    EKG:  EKG is NOT ordered today.    Recent Labs: 03/01/2016: Hemoglobin 11.8; Platelets 407 03/09/2016: ALT 21; BUN 12; Creat 0.66; Potassium 4.2; Sodium 140    Lipid Panel    Component Value Date/Time   CHOL 130 03/09/2016 1246   TRIG 203 (H) 03/09/2016 1246   HDL 34 (L) 03/09/2016 1246   CHOLHDL 3.8 03/09/2016 1246   VLDL 41 (H) 03/09/2016 1246   LDLCALC 55 03/09/2016 1246       Other studies Reviewed: Additional studies/ records that were reviewed today include:   02/29/16.  Cardiac cath: Narrative & Impression     Coronary artery disease with eccentric 70-80% proximal LAD stenosis. Noncritical 65% mid circumflex, 50% first diagonal, and 70% ramus intermedius.  Abnormal intermediate risk myocardial perfusion study with anteroapical perfusion abnormality car laced with the proximal LAD stenosis.  Successful stenting of the proximal LAD from 75% to 0% with TIMI grade 3 flow using a 16 x 3.5 Promus Premier  post dilated to 3.75 mm in diameter.  Normal LV function with EF 60%. Mild elevation and end-diastolic pressure at 19 mmHg.  RECOMMENDATIONS:   Increase the intensity of statin therapy.  Aspirin and Brilinta for at least 6 months.  Risk factor modification.  Anticipate discharge in a.m. if no groin complications.      ASSESSMENT AND PLAN:  1. CAD with stent to prox LAD and residual disease treated medically.  Now with SOB.  May be from the brilinta- appears euvolemic on exam.  Discussed with Dr. Tamala Julian and will stop brilinta and use Plavix, 150 mg loading dose then 75 mg daily.  Will check Echo and CXR and Pro bnp today.  I will see her back in 2 weeks and Dr. Tamala Julian in 6 months.    2.  SOB at random times.  No fevers or colds, continues to smoke though she is cutting back.    2.  HTN  Elevated today but she has not yet had her medication.    3.  HLD for hepatic and lipids today.  4. GERD no complaints  5. Tobacco use continues to decrease, down to 1-2 cigarettes per day.   6. DM followed by PCP   Current medicines are reviewed with the patient today.  The patient Has no concerns regarding medicines.  The following changes have been made:  See above Labs/ tests ordered today include:see above  Disposition:   FU:  see above  Signed, Cecilie Kicks, NP  04/11/2016 9:04 AM    [address], [place], [place], [place] Phone: [phone]; Fax: [phone]

## 2016-04-11 ENCOUNTER — Ambulatory Visit
Admission: RE | Admit: 2016-04-11 | Discharge: 2016-04-11 | Disposition: A | Discharge status: Home / Self Care | Payer: BC Managed Care – PPO | Source: Ambulatory Visit | Attending: Cardiology | Admitting: Cardiology

## 2016-04-11 ENCOUNTER — Ambulatory Visit (INDEPENDENT_AMBULATORY_CARE_PROVIDER_SITE_OTHER): Payer: BC Managed Care – PPO | Admitting: Cardiology

## 2016-04-11 ENCOUNTER — Ambulatory Visit (HOSPITAL_COMMUNITY): Payer: BC Managed Care – PPO | Attending: Cardiovascular Disease

## 2016-04-11 ENCOUNTER — Encounter: Payer: Self-pay | Admitting: Cardiology

## 2016-04-11 ENCOUNTER — Other Ambulatory Visit: Payer: Self-pay

## 2016-04-11 ENCOUNTER — Encounter (INDEPENDENT_AMBULATORY_CARE_PROVIDER_SITE_OTHER): Payer: Self-pay

## 2016-04-11 ENCOUNTER — Other Ambulatory Visit: Payer: BC Managed Care – PPO | Admitting: *Deleted

## 2016-04-11 VITALS — BP 146/76 | HR 88 | Ht 66.0 in | Wt 192.0 lb

## 2016-04-11 DIAGNOSIS — R0602 Shortness of breath: Secondary | ICD-10-CM

## 2016-04-11 DIAGNOSIS — IMO0002 Reserved for concepts with insufficient information to code with codable children: Secondary | ICD-10-CM

## 2016-04-11 DIAGNOSIS — I10 Essential (primary) hypertension: Secondary | ICD-10-CM | POA: Diagnosis not present

## 2016-04-11 DIAGNOSIS — E785 Hyperlipidemia, unspecified: Secondary | ICD-10-CM | POA: Diagnosis not present

## 2016-04-11 DIAGNOSIS — I371 Nonrheumatic pulmonary valve insufficiency: Secondary | ICD-10-CM | POA: Insufficient documentation

## 2016-04-11 DIAGNOSIS — I2510 Atherosclerotic heart disease of native coronary artery without angina pectoris: Secondary | ICD-10-CM

## 2016-04-11 DIAGNOSIS — I081 Rheumatic disorders of both mitral and tricuspid valves: Secondary | ICD-10-CM | POA: Diagnosis not present

## 2016-04-11 DIAGNOSIS — F17200 Nicotine dependence, unspecified, uncomplicated: Secondary | ICD-10-CM

## 2016-04-11 DIAGNOSIS — E1165 Type 2 diabetes mellitus with hyperglycemia: Secondary | ICD-10-CM

## 2016-04-11 DIAGNOSIS — E7800 Pure hypercholesterolemia, unspecified: Secondary | ICD-10-CM

## 2016-04-11 DIAGNOSIS — E119 Type 2 diabetes mellitus without complications: Secondary | ICD-10-CM | POA: Diagnosis not present

## 2016-04-11 DIAGNOSIS — E118 Type 2 diabetes mellitus with unspecified complications: Secondary | ICD-10-CM

## 2016-04-11 LAB — ECHOCARDIOGRAM COMPLETE
Height: 66 in
Weight: 3072 oz

## 2016-04-11 LAB — PRO B NATRIURETIC PEPTIDE: NT-PRO BNP: 44 pg/mL (ref 0–287)

## 2016-04-11 MED ORDER — CLOPIDOGREL BISULFATE 75 MG PO TABS: ORAL_TABLET | ORAL | 3 refills | Status: DC

## 2016-04-11 NOTE — Patient Instructions (Addendum)
Medication Instructions:  Your physician has recommended you make the following change in your medication:  1.  STOP the Brilinta 2.  START the Plavix 75 mg take 2 tablets on day 1 and 1 tablet daily thereafter  Labwork: TODAY:  PRO BNP, LIPID, & HEPATIC  Testing/Procedures: Your physician has requested that you have an echocardiogram TODAY ARRIVE AT 1:45. Echocardiography is a painless test that uses sound waves to create images of your heart. It provides your doctor with information about the size and shape of your heart and how well your heart's chambers and valves are working. This procedure takes approximately one hour. There are no restrictions for this procedure.  A chest x-ray takes a picture of the organs and structures inside the chest, including the heart, lungs, and blood vessels. This test can show several things, including, whether the heart is enlarges; whether fluid is building up in the lungs; and whether pacemaker / defibrillator leads are still in place.   Follow-Up: Your physician recommends that you schedule a follow-up appointment in: [place], NP  Your physician wants you to follow-up in: [place].  You will receive a reminder letter in the mail two months in advance. If you don't receive a letter, please call our office to schedule the follow-up appointment.    Any Other Special Instructions Will Be Listed Below (If Applicable).  Echocardiogram An echocardiogram, or echocardiography, uses sound waves (ultrasound) to produce an image of your heart. The echocardiogram is simple, painless, obtained within a short period of time, and offers valuable information to your health care provider. The images from an echocardiogram can provide information such as:  Evidence of coronary artery disease (CAD).  Heart size.  Heart muscle function.  Heart valve function.  Aneurysm detection.  Evidence of a past heart attack.  Fluid  buildup around the heart.  Heart muscle thickening.  Assess heart valve function. Tell a health care provider about:  Any allergies you have.  All medicines you are taking, including vitamins, herbs, eye drops, creams, and over-the-counter medicines.  Any problems you or family members have had with anesthetic medicines.  Any blood disorders you have.  Any surgeries you have had.  Any medical conditions you have.  Whether you are pregnant or may be pregnant. What happens before the procedure? No special preparation is needed. Eat and drink normally. What happens during the procedure?  In order to produce an image of your heart, gel will be applied to your chest and a wand-like tool (transducer) will be moved over your chest. The gel will help transmit the sound waves from the transducer. The sound waves will harmlessly bounce off your heart to allow the heart images to be captured in real-time motion. These images will then be recorded.  You may need an IV to receive a medicine that improves the quality of the pictures. What happens after the procedure? You may return to your normal schedule including diet, activities, and medicines, unless your health care provider tells you otherwise. This information is not intended to replace advice given to you by your health care provider. Make sure you discuss any questions you have with your health care provider. Document Released: 02/24/2000 Document Revised: 10/15/2015 Document Reviewed: 11/03/2012 Elsevier Interactive Patient Education  2017 [place] are tests that create pictures of the inside of your body using radiation. Different body parts absorb different amounts of radiation, which show up on the  X-ray pictures in shades of black, gray, and white. X-rays are used to look for many health conditions, including broken bones, lung problems, and some types of cancer. Tell a health care provider about:  Any  allergies you have.  All medicines you are taking, including vitamins, herbs, eye drops, creams, and over-the-counter medicines.  Previous surgeries you have had.  Medical conditions you have. What are the risks? Getting an X-ray is a safe procedure. What happens before the procedure?  Tell the X-ray technician if you are pregnant or might be pregnant.  You may be asked to wear a protective lead apron to hide parts of your body from the X-ray.  You usually will need to undress whatever part of your body needs the X-ray. You will be given a hospital gown to wear, if needed.  You may need to remove your glasses, jewelry, and other metal objects. What happens during the procedure?  The X-ray machine creates a picture by using a tiny burst of radiation. It is painless.  You may need to have several pictures taken at different angles.  You will need to try to be as still as you can during the examination to get the best possible images. What happens after the procedure?  You will be able to resume your normal activities.  The X-ray will be examined by your health care provider or a radiology specialist.  It is your responsibility to get your test results. Ask your health care provider when to expect your results and how to get them. This information is not intended to replace advice given to you by your health care provider. Make sure you discuss any questions you have with your health care provider. Document Released: 02/26/2005 Document Revised: 10/27/2015 Document Reviewed: 04/22/2013 Elsevier Interactive Patient Education  2017 [employer].   If you need a refill on your cardiac medications before your next appointment, please call your pharmacy.

## 2016-04-12 ENCOUNTER — Telehealth: Payer: Self-pay | Admitting: *Deleted

## 2016-04-12 DIAGNOSIS — R9431 Abnormal electrocardiogram [ECG] [EKG]: Secondary | ICD-10-CM

## 2016-04-12 MED ORDER — POTASSIUM CHLORIDE CRYS ER 20 MEQ PO TBCR: EXTENDED_RELEASE_TABLET | ORAL | 0 refills | Status: DC

## 2016-04-12 MED ORDER — FUROSEMIDE 40 MG PO TABS: ORAL_TABLET | ORAL | 0 refills | Status: DC

## 2016-04-12 NOTE — Telephone Encounter (Signed)
Pt aware of her CXR and she will take the Lasix 40 qd and Potassium 20 X's 2 days and stop. Rx sent to pharmacy. Pt verbalized understanding.

## 2016-04-12 NOTE — Telephone Encounter (Signed)
-----   Message from Isaiah Serge, NP sent at 04/11/2016  6:19 PM EST ----- Mild fluid on CXR due to HTN, continue medications but for 2 days add lasix 40 mg and K+ 20 meq just for 2 days.  Give her 10 pills of each and just have her keep the other pills.  Thanks.

## 2016-04-16 ENCOUNTER — Ambulatory Visit (HOSPITAL_COMMUNITY): Payer: BC Managed Care – PPO

## 2016-04-18 ENCOUNTER — Other Ambulatory Visit: Payer: Self-pay | Admitting: *Deleted

## 2016-04-18 ENCOUNTER — Ambulatory Visit (HOSPITAL_COMMUNITY): Payer: BC Managed Care – PPO

## 2016-04-18 MED ORDER — ASPIRIN EC 81 MG PO TBEC: 81.0000 mg | DELAYED_RELEASE_TABLET | Freq: Every day | ORAL | 3 refills | Status: DC

## 2016-04-19 ENCOUNTER — Encounter: Payer: Self-pay | Admitting: Physician Assistant

## 2016-04-19 ENCOUNTER — Ambulatory Visit (INDEPENDENT_AMBULATORY_CARE_PROVIDER_SITE_OTHER): Payer: BC Managed Care – PPO | Admitting: Physician Assistant

## 2016-04-19 VITALS — BP 130/72 | HR 90 | Ht 66.0 in | Wt 190.0 lb

## 2016-04-19 DIAGNOSIS — I2510 Atherosclerotic heart disease of native coronary artery without angina pectoris: Secondary | ICD-10-CM | POA: Diagnosis not present

## 2016-04-19 DIAGNOSIS — F17200 Nicotine dependence, unspecified, uncomplicated: Secondary | ICD-10-CM | POA: Diagnosis not present

## 2016-04-19 DIAGNOSIS — I10 Essential (primary) hypertension: Secondary | ICD-10-CM | POA: Diagnosis not present

## 2016-04-19 DIAGNOSIS — E785 Hyperlipidemia, unspecified: Secondary | ICD-10-CM

## 2016-04-19 DIAGNOSIS — R0602 Shortness of breath: Secondary | ICD-10-CM

## 2016-04-19 MED ORDER — ISOSORBIDE MONONITRATE ER 60 MG PO TB24: 60.0000 mg | ORAL_TABLET | Freq: Every day | ORAL | 3 refills | Status: DC

## 2016-04-19 NOTE — Progress Notes (Signed)
Cardiology Office Note    Date:  04/19/2016   ID:  Leah Norton, DOB 06-Jun-1955, MRN [id number]  PCP:  Kandice Hams, MD  Cardiologist:  Dr. Tamala Julian  Chief Complaint:SOB  History of Present Illness:  Leah Norton is a 61 y.o. female with hx of CAD,  diabetes, hyper-lipidemia, hypertension, ongoing  tobacco abuse presents for follow up of SOB.   Prior nonobstructive coronary disease by angiography in 2005. Recent chest CT because of hemoptysis reveal significant three-vessel coronary calcification. She deniedexertional dyspnea and chest pain. She does have sleep apnea that is not being treated. She denies claudication. She has had a previous right ankle fracture that prevents significant aerobic activity. She denies orthopnea, PND, palpitations, and syncope.    12/20 with Dr. Tamala Julian for cardiac cath showing 70-80% pLAD stenosis treated with DEA x1, 65% in mLCx, 50% in first diag, and 70% in ramus. Her statin therapy was increased. She will be placed on DAPT with ASA and Brilinta for at least 6 months. EF reported at 60%  Seen by Cecilie Kicks 03/09/16 for SOB after stent placement.  Her SOB was increased from discharge, we stopped Brilinta and changed to plavix.   Again seen by laura 04/11/16. She continues to have shortness of breath. Pro-BNP normal. echo 04/11/16 shwoed left ventricular function of 50-55%, mild LVH. Doppler parameters are consistent with both elevated ventricular end-diastolic filling pressure and elevated left atrial filling  pressure. CXR showed mild fluid overload. Treated with Lasix and supplemental K of 2 days.   Here today for follow up. She states that her Brilinta changes to Plavix during last office visit of 04/11/16. She has some improvement on her shortness of breath. No symptoms prior to cath. Seems really frustrated. Shortness of breath gets worse with exertion along with intermittent chest pain. Seems improvement with rest. Denies orthopnea, PND,  syncope, lower extremity edema. She is trying to cut back on her tobacco smoking. Complaining of bilateral muscle achiness after Lasix dose.  Past Medical History:  Diagnosis Date  . Anginal pain ([place])   . Arthritis    "right anikle" (02/29/2016)  . CAD in native artery 02/08/2016   Three-vessel coronary artery calcification on CT scan  . Complication of anesthesia    "had reaction w/hives, rash, itching" (02/29/2016)  . Constipation   . Coronary artery disease    12/17 PCI with DESx1 to pLAD EF normal  . Enlarged heart   . GERD (gastroesophageal reflux disease)   . High cholesterol   . Hypertension   . Migraine    hx  . Type II diabetes mellitus ([place])     Past Surgical History:  Procedure Laterality Date  . ABDOMINAL HYSTERECTOMY    . CARDIAC CATHETERIZATION N/A 02/29/2016   Procedure: Left Heart Cath and Coronary Angiography;  Surgeon: Belva Crome, MD;  Location: [facility];  Service: Cardiovascular;  Laterality: N/A;  . CARDIAC CATHETERIZATION N/A 02/29/2016   Procedure: Coronary Stent Intervention;  Surgeon: Belva Crome, MD;  Location: [facility];  Service: Cardiovascular;  Laterality: N/A;  . CARDIAC CATHETERIZATION  2005   "no blockages"  . COLONOSCOPY WITH PROPOFOL N/A 03/15/2014   Procedure: COLONOSCOPY WITH PROPOFOL;  Surgeon: Garlan Fair, MD;  Location: [facility];  Service: Endoscopy;  Laterality: N/A;  . FRACTURE SURGERY    . MULTIPLE TOOTH EXTRACTIONS     teeth removed  . ORIF ANKLE FRACTURE Right 11/12/2012   Procedure: OPEN REDUCTION INTERNAL FIXATION (  ORIF) ANKLE FRACTURE;  Surgeon: Marianna Payment, MD;  Location: [facility];  Service: Orthopedics;  Laterality: Right;  Open reduction internal fixation ankle fracture, trimalleolar    Current Medications: Prior to Admission medications   Medication Sig Start Date End Date Taking? Authorizing Provider  albuterol (PROVENTIL HFA;VENTOLIN HFA) 108 (90 BASE) MCG/ACT inhaler Inhale 1-2 puffs  into the lungs every 6 (six) hours as needed for wheezing. 12/08/11   Fransico Meadow, PA-C  Ascorbic Acid (VITAMIN C PO) Take 1 tablet by mouth daily.    Historical Provider, MD  aspirin EC 81 MG tablet Take 1 tablet (81 mg total) by mouth daily. 04/18/16   Burnell Blanks, MD  atenolol (TENORMIN) 50 MG tablet Take 50 mg by mouth daily.     Historical Provider, MD  atorvastatin (LIPITOR) 80 MG tablet Take 1 tablet (80 mg total) by mouth daily at 6 PM. 03/01/16   Cheryln Manly, NP  buPROPion [facility] SR) 150 MG 12 hr tablet Take 150 mg by mouth daily.     Historical Provider, MD  Cholecalciferol (VITAMIN D) 2000 units CAPS Take 2,000 Units by mouth daily.    Historical Provider, MD  clopidogrel (PLAVIX) 75 MG tablet TAKE 2 TABLETS BY MOUTH ON DAY 1 THEN TAKE 1 TABLET BY MOUTH DAILY THEREAFTER 04/11/16   Isaiah Serge, NP  furosemide (LASIX) 40 MG tablet Take 1 tablet by mouth X's 2 days then stop 04/12/16   Isaiah Serge, NP  hydrochlorothiazide (HYDRODIURIL) 25 MG tablet Take 25 mg by mouth every morning.     Historical Provider, MD  isosorbide mononitrate (IMDUR) 30 MG 24 hr tablet Take 30 mg by mouth every morning.     Historical Provider, MD  losartan (COZAAR) 100 MG tablet Take 1 tablet (100 mg total) by mouth every morning. 03/01/16   Cheryln Manly, NP  nitroGLYCERIN (NITROSTAT) 0.4 MG SL tablet Place 1 tablet (0.4 mg total) under the tongue every 5 (five) minutes as needed. 03/01/16   Cheryln Manly, NP  pantoprazole (PROTONIX) 40 MG tablet Take 1 tablet (40 mg total) by mouth daily. 03/09/16   Isaiah Serge, NP  potassium chloride SA (K-DUR,KLOR-CON) 20 MEQ tablet Take 1 tablet by mouth X's 2 days then stop 04/12/16   Isaiah Serge, NP  sitaGLIPtin-metformin (JANUMET) 50-1000 MG per tablet Take 1 tablet by mouth 2 (two) times daily with a meal.    Historical Provider, MD    Allergies:   Codeine; Erythromycin; Penicillins; Sulfonamide derivatives; and Vicodin  [hydrocodone-acetaminophen]   Social History   Social History  . Marital status: Single    Spouse name: N/A  . Number of children: N/A  . Years of education: N/A   Social History Main Topics  . Smoking status: Current Every Day Smoker    Packs/day: 0.25    Years: 40.00    Types: Cigarettes  . Smokeless tobacco: Never Used  . Alcohol use Yes     Comment: 02/29/2016 "I'll have some wine or mixed drinks a few times/year; special occasions"  . Drug use: No  . Sexual activity: Not Currently   Other Topics Concern  . None   Social History Narrative  . None     Family History:  The patient's family history includes Hypertension in her mother.   ROS:   Please see the history of present illness.    ROS All other systems reviewed and are negative.   PHYSICAL EXAM:   VS:  BP 130/72 (BP Location: Left Arm, Patient Position: Sitting, Cuff Size: Normal)   Pulse 90   Ht 5\' 6"  (1.676 m)   Wt 190 lb (86.2 kg)   SpO2 97%   BMI 30.67 kg/m    GEN: Well nourished, well developed, in no acute distress  HEENT: normal  Neck: no JVD, carotid bruits, or masses Cardiac: RRR; no murmurs, rubs, or gallops,no edema  Respiratory:  clear to auscultation bilaterally, normal work of breathing GI: soft, nontender, nondistended, + BS MS: no deformity or atrophy  Skin: warm and dry, no rash Neuro:  Alert and Oriented x 3, Strength and sensation are intact Psych: euthymic mood, full affect  Wt Readings from Last 3 Encounters:  04/19/16 190 lb (86.2 kg)  04/11/16 192 lb (87.1 kg)  03/09/16 194 lb (88 kg)      Studies/Labs Reviewed:   EKG:  EKG is not ordered today.    Recent Labs: 03/01/2016: Hemoglobin 11.8; Platelets 407 03/09/2016: ALT 21; BUN 12; Creat 0.66; Potassium 4.2; Sodium 140 04/11/2016: NT-Pro BNP 44   Lipid Panel    Component Value Date/Time   CHOL 130 03/09/2016 1246   TRIG 203 (H) 03/09/2016 1246   HDL 34 (L) 03/09/2016 1246   CHOLHDL 3.8 03/09/2016 1246   VLDL 41  (H) 03/09/2016 1246   LDLCALC 55 03/09/2016 1246    Additional studies/ records that were reviewed today include:   Echocardiogram: 04/11/16 LV EF: 50% -   55%  ------------------------------------------------------------------- Indications:      (R06.02).  ------------------------------------------------------------------- History:   PMH:  Acquired from the patient and from the patient&'s chart.  Dyspnea.  Coronary artery disease.  Risk factors:  Current tobacco use. Hypertension. Diabetes mellitus. Dyslipidemia.  ------------------------------------------------------------------- Study Conclusions  - Left ventricle: Posterior lateral wall hypokinesis The cavity   size was normal. Wall thickness was increased in a pattern of   mild LVH. Systolic function was normal. The estimated ejection   fraction was in the range of 50% to 55%. Wall motion was normal;   there were no regional wall motion abnormalities. Doppler   parameters are consistent with both elevated ventricular   end-diastolic filling pressure and elevated left atrial filling   pressure. - Mitral valve: There was mild regurgitation. - Left atrium: The atrium was moderately dilated. - Atrial septum: No defect or patent foramen ovale was identified.   Cardiac Catheterization: 02/29/16 Coronary Stent Intervention  Left Heart Cath and Coronary Angiography  Conclusion    Coronary artery disease with eccentric 70-80% proximal LAD stenosis. Noncritical 65% mid circumflex, 50% first diagonal, and 70% ramus intermedius.  Abnormal intermediate risk myocardial perfusion study with anteroapical perfusion abnormality car laced with the proximal LAD stenosis.  Successful stenting of the proximal LAD from 75% to 0% with TIMI grade 3 flow using a 16 x 3.5 Promus Premier post dilated to 3.75 mm in diameter.  Normal LV function with EF 60%. Mild elevation and end-diastolic pressure at 19  mmHg.  RECOMMENDATIONS:   Increase the intensity of statin therapy.  Aspirin and Brilinta for at least 6 months.  Risk factor modification.  Anticipate discharge in a.m. if no groin complications.       ASSESSMENT & PLAN:    1. Dyspnea on exertion - Symptoms started after stent placement. Brillinta changed to Plavix 04/11/16. Symptoms somewhat improved. She wants "answer to  shortness of breath, I was fine prior to cath".  - No mprovement on her symptoms after 2 doses of Lasix. -  Discussed diffrential in length.    * Could be due to Brilinta. ? Improvement. Now discontinued.   (Most likely cause)   * She is euvolemic exam. Likely not due to volume overload. Recent tx with lasix as above. Echo with normal EF.    * Does have a residual coronary artery disease. Noted worsening dyspnea with exertion with atypical chest pain. Trial of increase     imdur to 60mg  qd.   * Pulmonary issue. Long-standing history of tobacco smoking. Trying to cut back. Referral to pulmonologist. NO COPD on chest      Xray.               CT of chest 11/2015:   Small pulmonary nodules are likely benign as discussed above.                                                  However, given history of smoking and mild emphysematous changes a                                                   12 month followup noncontrast CT is suggested.                                                    No mediastinal or hilar mass or adenopathy.  Trying to reassurance patient multiple times. However, she wants answers. Will sent note to Dr. Tamala Julian for review and further recommendations.    2. Muscle ache - After lasix treatment. Check BMET and Mg today.   3. CAD s/p DES to pro LAD with residual disease - Continue ASA, plavix and statin.  4. HTN - Stable and controlled.  5. Tobacco abuse - Advised complete cessation.   6. HLD - 03/09/2016: Cholesterol 130; HDL 34; LDL Cholesterol 55; Triglycerides 203; VLDL 41  -  Continue statin.        Medication Adjustments/Labs and Tests Ordered: Current medicines are reviewed at length with the patient today.  Concerns regarding medicines are outlined above.  Medication changes, Labs and Tests ordered today are listed in the Patient Instructions below. Patient Instructions  Medication Instructions:  INCREASE ISOSORBIDE MONONITRATE (IMDUR) to 60 MG DAILY.  Labwork: LABS TODAY: Basic metabolic panel, Magnesium  Testing/Procedures: You have been referred to Pulmonology for Shortness of Breath.   Follow-Up: Your physician recommends that you schedule a follow-up appointment in: 2 WEEKS with DR [facility].   Any Other Special Instructions Will Be Listed Below (If Applicable).     If you need a refill on your cardiac medications before your next appointment, please call your pharmacy.     Jarrett Soho, [place]  04/19/2016 12:04 PM    [facility] [address], [place], [place]  [address] Phone: [phone]; Fax: [phone]

## 2016-04-19 NOTE — Patient Instructions (Signed)
Medication Instructions:  INCREASE ISOSORBIDE MONONITRATE (IMDUR) to 60 MG DAILY.  Labwork: LABS TODAY: Basic metabolic panel, Magnesium  Testing/Procedures: You have been referred to Pulmonology for Shortness of Breath.   Follow-Up: Your physician recommends that you schedule a follow-up appointment in: 2 WEEKS with DR [facility].   Any Other Special Instructions Will Be Listed Below (If Applicable).     If you need a refill on your cardiac medications before your next appointment, please call your pharmacy.

## 2016-04-20 ENCOUNTER — Telehealth: Payer: Self-pay | Admitting: Interventional Cardiology

## 2016-04-20 ENCOUNTER — Ambulatory Visit (HOSPITAL_COMMUNITY): Payer: BC Managed Care – PPO

## 2016-04-20 LAB — BASIC METABOLIC PANEL
BUN/Creatinine Ratio: 17 (ref 12–28)
BUN: 12 mg/dL (ref 8–27)
CALCIUM: 10 mg/dL (ref 8.7–10.3)
CO2: 24 mmol/L (ref 18–29)
Chloride: 101 mmol/L (ref 96–106)
Creatinine, Ser: 0.71 mg/dL (ref 0.57–1.00)
GFR calc non Af Amer: 92 mL/min/{1.73_m2} (ref >59)
GFR, EST AFRICAN AMERICAN: 106 mL/min/{1.73_m2} (ref >59)
Glucose: 109 mg/dL — ABNORMAL HIGH (ref 65–99)
POTASSIUM: 4.3 mmol/L (ref 3.5–5.2)
Sodium: 141 mmol/L (ref 134–144)

## 2016-04-20 LAB — MAGNESIUM: Magnesium: 1.6 mg/dL (ref 1.6–2.3)

## 2016-04-20 MED ORDER — FUROSEMIDE 20 MG PO TABS: 20.0000 mg | ORAL_TABLET | Freq: Every day | ORAL | 0 refills | Status: DC

## 2016-04-20 NOTE — Telephone Encounter (Signed)
Since LAD PCI she complains of exertional dyspnea. We initially felt that Brilinta was the culprit. The medication was discontinued and substituted with Plavix. The breathing is somewhat better but still a concern. She has seen Cecilie Kicks  NP and Robbie Lis, PA-C on 3 separate occasions with adjustments being made but producing frustration with the patient since she does not feel back to baseline.  We had a conversation and have decided to start furosemide 20 mg per day. I did this based upon chest x-ray done on January 31 that demonstrated evidence of mild CHF. BNP however was 44 which goes against heart failure.  She will come to see me on February 19 at which time a basic metabolic panel will be done.

## 2016-04-20 NOTE — Telephone Encounter (Signed)
Follow Up:; ° ° °Returning your call. °

## 2016-04-20 NOTE — Addendum Note (Signed)
Addended by: Loren Racer on: 04/20/2016 04:21 PM   Modules accepted: Orders

## 2016-04-20 NOTE — Telephone Encounter (Signed)
-----   Message from [place], [place] sent at 04/20/2016 12:51 PM EST ----- Labs  Normal. Her muscle ache is not due electrolyte abnormality. If ongoing symptoms, will consider reducing statin during follow up Dr. Tamala Julian.

## 2016-04-20 NOTE — Telephone Encounter (Signed)
Returned pts call and discussed her lab results. She verbalized understanding.  

## 2016-04-20 NOTE — Telephone Encounter (Signed)
Spoke with pt and asked about preferred pharmacy.  Sent prescription in.  Scheduled pt to see Dr. Tamala Julian on 04/30/16 at 2pm.  Pt aware that we will do labs at that time.

## 2016-04-23 ENCOUNTER — Ambulatory Visit (HOSPITAL_COMMUNITY): Payer: BC Managed Care – PPO

## 2016-04-24 ENCOUNTER — Telehealth: Payer: Self-pay | Admitting: Interventional Cardiology

## 2016-04-24 NOTE — Telephone Encounter (Signed)
Spoke with pt and went over recommendations per Dr. Tamala Julian.  Pt will call if worsening of chest discomfort.

## 2016-04-24 NOTE — Telephone Encounter (Signed)
Decrease Imdur to the intended dose of 60 mg per day. I am happy that shortness of breath is better. We will discuss further at office visit.

## 2016-04-24 NOTE — Telephone Encounter (Signed)
Mrs. Stigers is calling about her medication Imdur. She has some concerns , Please call .   Thanks

## 2016-04-24 NOTE — Telephone Encounter (Signed)
Pt states that she didn't understanding why she had a prescription of Imdur for 30mg  and 60mg .  Advised her that Imdur was increased to 60mg  QD at last OV with Vin Bhagat, PA-C.  Pt states that she has been taking one 30mg  and one 60mg  QD since Saturday.  She states we never told her to stop the 30mg .  Reminded her that at appt she was told to increase to 60mg .  Pt states since taking the 90mg  QD she has had some chest discomfort 2-3 times a day that lasts a couple of minutes.  SOB has improved.  Denies chest discomfort at this time.  Pt unable to describe chest discomfort.  Denies sharp, shooting pains or radiating pains.  No vitals available.  Pt has appt on 04/30/16 with Dr. Tamala Julian.  Will route to Dr. Tamala Julian for review and advisement.

## 2016-04-25 ENCOUNTER — Ambulatory Visit (HOSPITAL_COMMUNITY): Payer: BC Managed Care – PPO

## 2016-04-27 ENCOUNTER — Ambulatory Visit (HOSPITAL_COMMUNITY): Payer: BC Managed Care – PPO

## 2016-04-29 DIAGNOSIS — R0609 Other forms of dyspnea: Secondary | ICD-10-CM

## 2016-04-29 NOTE — Progress Notes (Signed)
Cardiology Office Note    Date:  04/30/2016   ID:  [name], DOB Oct 04, 1955, MRN [id number]  PCP:  Leah Hams, MD  Cardiologist: Leah Grooms, MD   Chief Complaint  Patient presents with  . Shortness of Breath    History of Present Illness:  Leah Norton is a 61 y.o. female with history of diabetes, hyper-lipidemia, hypertension, tobacco abuse, nonobstructive coronary disease by angiography in 2005. Recent nuclear study demonstrated evidence of ischemia. Subsequent catheterization demonstrated 7585% proximal LAD which was stented with DES. Brilinta caused dyspnea and has been discontinued with substitution of Plavix.  Since PCI the patient's clinical course is been somewhat rocky. She was seen in clinical follow-up by APP Leah Norton. She complained of chest discomfort and dyspnea. In further questioning the chest discomfort is a sensation of intermittent fluttering and palpitations. He was never chest pain or tightness to suggest ischemia. After discontinuation of Brilinta, dyspnea improved. She continues to have the palpitations. Unfortunately both Imdur and Lasix was started for the symptom complex and she is actually felt worse on therapy. Although all and she is decreased furosemide down to a half tablet daily (20 mg tablets). She is felt lower extremity weakness and cramping with ambulation.  She continues to smoke cigarettes.   Past Medical History:  Diagnosis Date  . Anginal pain ([place])   . Arthritis    "right anikle" (02/29/2016)  . CAD in native artery 02/08/2016   Three-vessel coronary artery calcification on CT scan  . Complication of anesthesia    "had reaction w/hives, rash, itching" (02/29/2016)  . Constipation   . Coronary artery disease    12/17 PCI with DESx1 to pLAD EF normal  . Enlarged heart   . GERD (gastroesophageal reflux disease)   . High cholesterol   . Hypertension   . Migraine    hx  . Type II diabetes mellitus ([place])     Past  Surgical History:  Procedure Laterality Date  . ABDOMINAL HYSTERECTOMY    . CARDIAC CATHETERIZATION N/A 02/29/2016   Procedure: Left Heart Cath and Coronary Angiography;  Surgeon: Leah Crome, MD;  Location: [facility];  Service: Cardiovascular;  Laterality: N/A;  . CARDIAC CATHETERIZATION N/A 02/29/2016   Procedure: Coronary Stent Intervention;  Surgeon: Leah Crome, MD;  Location: [facility];  Service: Cardiovascular;  Laterality: N/A;  . CARDIAC CATHETERIZATION  2005   "no blockages"  . COLONOSCOPY WITH PROPOFOL N/A 03/15/2014   Procedure: COLONOSCOPY WITH PROPOFOL;  Surgeon: Garlan Fair, MD;  Location: [facility];  Service: Endoscopy;  Laterality: N/A;  . FRACTURE SURGERY    . MULTIPLE TOOTH EXTRACTIONS     teeth removed  . ORIF ANKLE FRACTURE Right 11/12/2012   Procedure: OPEN REDUCTION INTERNAL FIXATION (ORIF) ANKLE FRACTURE;  Surgeon: Marianna Payment, MD;  Location: [facility];  Service: Orthopedics;  Laterality: Right;  Open reduction internal fixation ankle fracture, trimalleolar    Current Medications: Outpatient Medications Prior to Visit  Medication Sig Dispense Refill  . albuterol (PROVENTIL HFA;VENTOLIN HFA) 108 (90 BASE) MCG/ACT inhaler Inhale 1-2 puffs into the lungs every 6 (six) hours as needed for wheezing. 1 Inhaler 0  . Ascorbic Acid (VITAMIN C PO) Take 1 tablet by mouth daily.    Marland Kitchen aspirin EC 81 MG tablet Take 1 tablet (81 mg total) by mouth daily. 90 tablet 3  . atenolol (TENORMIN) 50 MG tablet Take 50 mg by mouth daily.     Marland Kitchen  atorvastatin (LIPITOR) 80 MG tablet Take 1 tablet (80 mg total) by mouth daily at 6 PM. 30 tablet 6  . buPROPion (WELLBUTRIN SR) 150 MG 12 hr tablet Take 150 mg by mouth daily.     . Cholecalciferol (VITAMIN D) 2000 units CAPS Take 2,000 Units by mouth daily.    . hydrochlorothiazide (HYDRODIURIL) 25 MG tablet Take 25 mg by mouth every morning.     Marland Kitchen losartan (COZAAR) 100 MG tablet Take 1 tablet (100 mg total) by mouth  every morning. 30 tablet 6  . pantoprazole (PROTONIX) 40 MG tablet Take 1 tablet (40 mg total) by mouth daily. 30 tablet 11  . sitaGLIPtin-metformin (JANUMET) 50-1000 MG per tablet Take 1 tablet by mouth 2 (two) times daily with a meal.    . furosemide (LASIX) 20 MG tablet Take 1 tablet (20 mg total) by mouth daily. Until you are seen on 04/30/16. 10 tablet 0  . isosorbide mononitrate (IMDUR) 60 MG 24 hr tablet Take 1 tablet (60 mg total) by mouth daily. 90 tablet 3  . clopidogrel (PLAVIX) 75 MG tablet TAKE 2 TABLETS BY MOUTH ON DAY 1 THEN TAKE 1 TABLET BY MOUTH DAILY THEREAFTER (Patient not taking: Reported on 04/30/2016) 91 tablet 3  . nitroGLYCERIN (NITROSTAT) 0.4 MG SL tablet Place 1 tablet (0.4 mg total) under the tongue every 5 (five) minutes as needed. (Patient not taking: Reported on 04/30/2016) 25 tablet 3   No facility-administered medications prior to visit.      Allergies:   Codeine; Erythromycin; Penicillins; Sulfonamide derivatives; and Vicodin [hydrocodone-acetaminophen]   Social History   Social History  . Marital status: Single    Spouse name: N/A  . Number of children: N/A  . Years of education: N/A   Social History Main Topics  . Smoking status: Current Every Day Smoker    Packs/day: 0.25    Years: 40.00    Types: Cigarettes  . Smokeless tobacco: Never Used  . Alcohol use Yes     Comment: 02/29/2016 "I'll have some wine or mixed drinks a few times/year; special occasions"  . Drug use: No  . Sexual activity: Not Currently   Other Topics Concern  . None   Social History Narrative  . None     Family History:  The patient's family history includes Hypertension in her mother.   ROS:   Please see the history of present illness.    Fluttering as noted above previously characterized as discomfort. Shortness of breath is improved off Brilinta. She inquires about whether she should apply for disability. Seems to be somewhat H is sent depressed. Has an upcoming  appointment with pulmonology. Continues to smoke cigarettes.  All other systems reviewed and are negative.   PHYSICAL EXAM:   VS:  BP (!) 116/58 (BP Location: Right Arm)   Pulse 84   Ht 5\' 6"  (1.676 m)   Wt 183 lb (83 kg)   BMI 29.54 kg/m    GEN: Well nourished, well developed, in no acute distress  HEENT: normal  Neck: no JVD, carotid bruits, or masses Cardiac: RRR; no murmurs, rubs, or gallops,no edema  Respiratory:  clear to auscultation bilaterally, normal work of breathing GI: soft, nontender, nondistended, + BS MS: no deformity or atrophy  Skin: warm and dry, no rash Neuro:  Alert and Oriented x 3, Strength and sensation are intact Psych: euthymic mood, full affect  Wt Readings from Last 3 Encounters:  04/30/16 183 lb (83 kg)  04/19/16 190 lb (86.2  kg)  04/11/16 192 lb (87.1 kg)      Studies/Labs Reviewed:   EKG:  EKG  Not repeated  Recent Labs: 03/01/2016: Hemoglobin 11.8; Platelets 407 03/09/2016: ALT 21 04/11/2016: NT-Pro BNP 44 04/19/2016: BUN 12; Creatinine, Ser 0.71; Magnesium 1.6; Potassium 4.3; Sodium 141   Lipid Panel    Component Value Date/Time   CHOL 130 03/09/2016 1246   TRIG 203 (H) 03/09/2016 1246   HDL 34 (L) 03/09/2016 1246   CHOLHDL 3.8 03/09/2016 1246   VLDL 41 (H) 03/09/2016 1246   LDLCALC 55 03/09/2016 1246    Additional studies/ records that were reviewed today include:  Cardiac Intervention 2017:  Coronary Diagrams   Diagnostic Diagram     Post-Intervention Diagram        ASSESSMENT:    1. CAD in native artery   2. Essential hypertension   3. Uncontrolled type 2 diabetes mellitus with complication, without long-term current use of insulin ([place])   4. Dyspnea on exertion      PLAN:  In order of problems listed above:  1. I believe she is doing well from coronary disease standpoint. I do not think that her current symptoms have anything to do with myocardial ischemia. 2. Blood pressure is well controlled today and due  partially to the addition of isosorbide and furosemide to her current regimen. I believe these meds are causing side effects and both should be discontinued. We will check a basic metabolic panel today to rule out.  3.  not addressed 4. Encouraged smoking cessation. Encouraged to keep appointment with pulmonology.    Medication Adjustments/Labs and Tests Ordered: Current medicines are reviewed at length with the patient today.  Concerns regarding medicines are outlined above.  Medication changes, Labs and Tests ordered today are listed in the Patient Instructions below. Patient Instructions  Medication Instructions:  1) STOP Isosorbide 2) STOP Furosemide  Labwork: BMET today  Testing/Procedures: None  Follow-Up: Your physician recommends that you schedule a follow-up appointment in: 4 months with Dr. Tamala Julian.    Any Other Special Instructions Will Be Listed Below (If Applicable).     If you need a refill on your cardiac medications before your next appointment, please call your pharmacy.      Signed, Leah Grooms, MD  04/30/2016 2:25 PM    [facility] [address], [place], [place]  [address] Phone: [phone]; Fax: [phone]

## 2016-04-30 ENCOUNTER — Ambulatory Visit (INDEPENDENT_AMBULATORY_CARE_PROVIDER_SITE_OTHER): Payer: BC Managed Care – PPO | Admitting: Interventional Cardiology

## 2016-04-30 ENCOUNTER — Encounter: Payer: Self-pay | Admitting: Interventional Cardiology

## 2016-04-30 ENCOUNTER — Ambulatory Visit (HOSPITAL_COMMUNITY): Payer: BC Managed Care – PPO

## 2016-04-30 VITALS — BP 116/58 | HR 84 | Ht 66.0 in | Wt 183.0 lb

## 2016-04-30 DIAGNOSIS — E1165 Type 2 diabetes mellitus with hyperglycemia: Secondary | ICD-10-CM

## 2016-04-30 DIAGNOSIS — I10 Essential (primary) hypertension: Secondary | ICD-10-CM

## 2016-04-30 DIAGNOSIS — E118 Type 2 diabetes mellitus with unspecified complications: Secondary | ICD-10-CM | POA: Diagnosis not present

## 2016-04-30 DIAGNOSIS — R0609 Other forms of dyspnea: Secondary | ICD-10-CM | POA: Diagnosis not present

## 2016-04-30 DIAGNOSIS — IMO0002 Reserved for concepts with insufficient information to code with codable children: Secondary | ICD-10-CM

## 2016-04-30 DIAGNOSIS — I2510 Atherosclerotic heart disease of native coronary artery without angina pectoris: Secondary | ICD-10-CM | POA: Diagnosis not present

## 2016-04-30 NOTE — Patient Instructions (Signed)
Medication Instructions:  1) STOP Isosorbide 2) STOP Furosemide  Labwork: BMET today  Testing/Procedures: None  Follow-Up: Your physician recommends that you schedule a follow-up appointment in: 4 months with Dr. Tamala Julian.    Any Other Special Instructions Will Be Listed Below (If Applicable).     If you need a refill on your cardiac medications before your next appointment, please call your pharmacy.

## 2016-05-01 ENCOUNTER — Encounter: Payer: Self-pay | Admitting: *Deleted

## 2016-05-01 LAB — BASIC METABOLIC PANEL
BUN / CREAT RATIO: 16 (ref 12–28)
BUN: 16 mg/dL (ref 8–27)
CHLORIDE: 91 mmol/L — AB (ref 96–106)
CO2: 25 mmol/L (ref 18–29)
CREATININE: 0.97 mg/dL (ref 0.57–1.00)
Calcium: 9.6 mg/dL (ref 8.7–10.3)
GFR calc Af Amer: 73 mL/min/{1.73_m2} (ref >59)
GFR calc non Af Amer: 63 mL/min/{1.73_m2} (ref >59)
GLUCOSE: 173 mg/dL — AB (ref 65–99)
POTASSIUM: 3.8 mmol/L (ref 3.5–5.2)
SODIUM: 138 mmol/L (ref 134–144)

## 2016-05-02 ENCOUNTER — Ambulatory Visit (HOSPITAL_COMMUNITY): Payer: BC Managed Care – PPO

## 2016-05-04 ENCOUNTER — Ambulatory Visit (HOSPITAL_COMMUNITY): Payer: BC Managed Care – PPO

## 2016-05-07 ENCOUNTER — Ambulatory Visit (HOSPITAL_COMMUNITY): Payer: BC Managed Care – PPO

## 2016-05-09 ENCOUNTER — Ambulatory Visit (HOSPITAL_COMMUNITY): Payer: BC Managed Care – PPO

## 2016-05-09 ENCOUNTER — Institutional Professional Consult (permissible substitution): Payer: BC Managed Care – PPO | Admitting: Internal Medicine

## 2016-05-11 ENCOUNTER — Ambulatory Visit (HOSPITAL_COMMUNITY): Payer: BC Managed Care – PPO

## 2016-05-14 ENCOUNTER — Ambulatory Visit (HOSPITAL_COMMUNITY): Payer: BC Managed Care – PPO

## 2016-05-14 ENCOUNTER — Ambulatory Visit (INDEPENDENT_AMBULATORY_CARE_PROVIDER_SITE_OTHER): Payer: BC Managed Care – PPO | Admitting: Internal Medicine

## 2016-05-14 ENCOUNTER — Encounter: Payer: Self-pay | Admitting: Internal Medicine

## 2016-05-14 VITALS — BP 126/66 | HR 72 | Ht 66.0 in | Wt 188.0 lb

## 2016-05-14 DIAGNOSIS — R0609 Other forms of dyspnea: Secondary | ICD-10-CM | POA: Diagnosis not present

## 2016-05-14 DIAGNOSIS — F17210 Nicotine dependence, cigarettes, uncomplicated: Secondary | ICD-10-CM | POA: Diagnosis not present

## 2016-05-14 NOTE — Assessment & Plan Note (Signed)
>   3 min Discussed the risks and costs (both direct and indirect)  of smoking relative to the benefits of quitting but patient unwilling to commit at this point to a specific quit date.    Although I don't endorse regular use of e cigs/ many pts find them helpful; however, I emphasized they should be considered a "one-way bridge" off all tobacco products.  

## 2016-05-14 NOTE — Progress Notes (Signed)
Subjective:    Patient ID: Leah Norton, female    DOB: [birth year],   MRN: [id number]  HPI  9 yobf active smoker good activity tolerance then underwent  exiscan myoview c/w  IHD > then stent placed Feb 29 2016 at [facility] with LVEDP 19 noted  and then onset of doe w/in a few weeks  Pt blamed on meds ? brelinta > were changed by Dr Tamala Julian and rx lasix  rec card rehab but pt said she could not afford it so referred to pulmonary clinic 05/14/2016 by Dr Tamala Julian  as only 75% improved back to baseline p above changes      05/14/2016 1st [facility] office visit/ Shatia Sindoni   Chief Complaint  Patient presents with  . Advice Only    Referred by Dr. 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 No unusual exp hx or h/o childhood pna/ asthma or knowledge of premature birth.  Sleeping ok without nocturnal  or early am exacerbation  of respiratory  c/o's or need for noct saba. Also denies any obvious fluctuation of symptoms with weather or environmental changes or other aggravating or alleviating factors except as outlined above   Current Medications, Allergies, Complete Past Medical History, Past Surgical History, Family History, and Social History were reviewed in [employer] record.     Review of Systems  Constitutional: Negative for fever and unexpected weight change.  HENT: Negative for congestion, dental problem, ear pain, nosebleeds, postnasal drip, rhinorrhea, sinus pressure, sneezing, sore throat and trouble swallowing.   Eyes: Negative for redness and itching.  Respiratory: Positive for cough and shortness of breath. Negative for chest  tightness and wheezing.   Cardiovascular: Negative for palpitations and leg swelling.  Gastrointestinal: Negative for nausea and vomiting.  Genitourinary: Negative for dysuria.  Musculoskeletal: Negative for joint swelling.  Skin: Negative for rash.  Neurological: Negative for headaches.  Hematological: Does not bruise/bleed easily.  Psychiatric/Behavioral: Negative for dysphoric mood. The patient is not nervous/anxious.        Objective:   Physical Exam  amb obese bf nad  Wt Readings from Last 3 Encounters:  05/14/16 188 lb (85.3 kg)  04/30/16 183 lb (83 kg)  04/19/16 190 lb (86.2 kg)    Vital signs reviewed  - Note on arrival 02 sats  98% on RA       HEENT: nl dentition, turbinates bilaterally, and oropharynx. Nl external ear canals without cough reflex   NECK :  without JVD/Nodes/TM/ nl carotid upstrokes bilaterally   LUNGS: no acc muscle use,  Nl contour chest which is clear to A and P bilaterally without cough on insp or exp maneuvers   CV:  RRR  no s3  II/VI SEM s  increase in P2, and no edema   ABD:  soft and nontender with nl inspiratory excursion in the supine position. No bruits or organomegaly appreciated, bowel sounds nl  MS:  Nl gait/ ext warm without deformities, calf tenderness, cyanosis or clubbing No obvious joint restrictions   SKIN: warm and dry without lesions    NEURO:  alert, approp, nl sensorium with  no motor or cerebellar deficits apparent.      I personally reviewed images and agree with radiology impression as follows:  CXR:   04/11/16 1. Cardiomegaly with mild pulmonary vascular prominence and bilateral interstitial prominence consistent with mild CHF.      Assessment & Plan:

## 2016-05-14 NOTE — Assessment & Plan Note (Signed)
Spirometry 05/14/2016  FEV1 2.77 (123%) s obstruction on f/v curve though it is not physiologic - 05/14/2016  Walked RA x 3 laps @ 185 ft each stopped due to  End of study, nl pace, no sob or desat     Not able to reproduce any symptoms of doe here but the hx is most suggestive of mild diastolic dysfunction / interstitial edema that has already been adequately addressed by cardiology and no further pulmonary eval/ pulmonary rx needed  X = "breathe clean air"   Total time devoted to counseling  > 50 % of initial 60 min office visit:  review case with pt/ discussion of options/alternatives/ personally creating written customized instructions  in presence of pt  then going over those specific  Instructions directly with the pt including how to use all of the meds but in particular covering each new medication in detail and the difference between the maintenance= "automatic" meds and the prns using an action plan format for the latter (If this problem/symptom => do that organization reading Left to right).  Please see AVS from this visit for a full list of these instructions which I personally wrote for this pt and  are unique to this visit.

## 2016-05-14 NOTE — Patient Instructions (Addendum)
The key is to stop smoking completely before smoking completely stops you!   Although I don't endorse regular use of e cigs/ many pts find them helpful; however, I emphasized they should be considered a "one-way bridge" off all tobacco products.   Pulmonary follow up is as needed

## 2016-05-14 NOTE — Assessment & Plan Note (Signed)
Body mass index is 30.34 kg/m.  Trending up  No results found for: TSH   Contributing to gerd risk/ doe/reviewed the need and the process to achieve and maintain neg calorie balance > defer f/u primary care including intermittently monitoring thyroid status

## 2016-05-16 ENCOUNTER — Ambulatory Visit (HOSPITAL_COMMUNITY): Payer: BC Managed Care – PPO

## 2016-05-16 ENCOUNTER — Ambulatory Visit: Payer: BC Managed Care – PPO | Admitting: Interventional Cardiology

## 2016-05-18 ENCOUNTER — Ambulatory Visit (HOSPITAL_COMMUNITY): Payer: BC Managed Care – PPO

## 2016-05-21 ENCOUNTER — Ambulatory Visit (HOSPITAL_COMMUNITY): Payer: BC Managed Care – PPO

## 2016-05-23 ENCOUNTER — Ambulatory Visit (HOSPITAL_COMMUNITY): Payer: BC Managed Care – PPO

## 2016-05-25 ENCOUNTER — Ambulatory Visit (HOSPITAL_COMMUNITY): Payer: BC Managed Care – PPO

## 2016-05-28 ENCOUNTER — Ambulatory Visit (HOSPITAL_COMMUNITY): Payer: BC Managed Care – PPO

## 2016-05-30 ENCOUNTER — Ambulatory Visit (HOSPITAL_COMMUNITY): Payer: BC Managed Care – PPO

## 2016-05-30 ENCOUNTER — Telehealth: Payer: Self-pay | Admitting: *Deleted

## 2016-05-30 NOTE — Telephone Encounter (Signed)
Received call from patient wanting to know if she could still get ASA from [place] (Research) even though she was no longer in the study. I tried to explain that not being able to take Brilinta excluded her from participating in [employer] research study and that we just could not give her ASA. She told me that didn't make since to her but understood if that was our policy. I told her I was sorry that she couldn't tolerate the Brilinta and participate.

## 2016-06-01 ENCOUNTER — Ambulatory Visit (HOSPITAL_COMMUNITY): Payer: BC Managed Care – PPO

## 2016-06-04 ENCOUNTER — Ambulatory Visit (HOSPITAL_COMMUNITY): Payer: BC Managed Care – PPO

## 2016-06-06 ENCOUNTER — Ambulatory Visit (HOSPITAL_COMMUNITY): Payer: BC Managed Care – PPO

## 2016-06-08 ENCOUNTER — Ambulatory Visit (HOSPITAL_COMMUNITY): Payer: BC Managed Care – PPO

## 2016-06-11 ENCOUNTER — Ambulatory Visit (HOSPITAL_COMMUNITY): Payer: BC Managed Care – PPO

## 2016-06-13 ENCOUNTER — Ambulatory Visit (HOSPITAL_COMMUNITY): Payer: BC Managed Care – PPO

## 2016-06-15 ENCOUNTER — Ambulatory Visit (HOSPITAL_COMMUNITY): Payer: BC Managed Care – PPO

## 2016-06-18 ENCOUNTER — Ambulatory Visit (HOSPITAL_COMMUNITY): Payer: BC Managed Care – PPO

## 2016-06-20 ENCOUNTER — Ambulatory Visit (HOSPITAL_COMMUNITY): Payer: BC Managed Care – PPO

## 2016-06-22 ENCOUNTER — Ambulatory Visit (HOSPITAL_COMMUNITY): Payer: BC Managed Care – PPO

## 2016-06-25 ENCOUNTER — Ambulatory Visit (HOSPITAL_COMMUNITY): Payer: BC Managed Care – PPO

## 2016-06-27 ENCOUNTER — Ambulatory Visit (HOSPITAL_COMMUNITY): Payer: BC Managed Care – PPO

## 2016-06-29 ENCOUNTER — Ambulatory Visit (HOSPITAL_COMMUNITY): Payer: BC Managed Care – PPO

## 2016-07-02 ENCOUNTER — Ambulatory Visit (HOSPITAL_COMMUNITY): Payer: BC Managed Care – PPO

## 2016-07-04 ENCOUNTER — Ambulatory Visit (HOSPITAL_COMMUNITY): Payer: BC Managed Care – PPO

## 2016-07-06 ENCOUNTER — Ambulatory Visit (HOSPITAL_COMMUNITY): Payer: BC Managed Care – PPO

## 2016-07-09 ENCOUNTER — Ambulatory Visit (HOSPITAL_COMMUNITY): Payer: BC Managed Care – PPO

## 2016-07-10 ENCOUNTER — Inpatient Hospital Stay (HOSPITAL_COMMUNITY)
Admission: EM | Admit: 2016-07-10 | Discharge: 2016-07-12 | DRG: 378 | Disposition: A | Discharge status: Home / Self Care | Payer: BC Managed Care – PPO | Attending: Internal Medicine | Admitting: Internal Medicine

## 2016-07-10 ENCOUNTER — Encounter (HOSPITAL_COMMUNITY): Payer: Self-pay | Admitting: Emergency Medicine

## 2016-07-10 DIAGNOSIS — Z79899 Other long term (current) drug therapy: Secondary | ICD-10-CM

## 2016-07-10 DIAGNOSIS — D62 Acute posthemorrhagic anemia: Secondary | ICD-10-CM | POA: Diagnosis present

## 2016-07-10 DIAGNOSIS — IMO0002 Reserved for concepts with insufficient information to code with codable children: Secondary | ICD-10-CM | POA: Diagnosis present

## 2016-07-10 DIAGNOSIS — Z90710 Acquired absence of both cervix and uterus: Secondary | ICD-10-CM | POA: Diagnosis not present

## 2016-07-10 DIAGNOSIS — K449 Diaphragmatic hernia without obstruction or gangrene: Secondary | ICD-10-CM | POA: Diagnosis present

## 2016-07-10 DIAGNOSIS — D649 Anemia, unspecified: Secondary | ICD-10-CM | POA: Diagnosis present

## 2016-07-10 DIAGNOSIS — Z9861 Coronary angioplasty status: Secondary | ICD-10-CM | POA: Diagnosis not present

## 2016-07-10 DIAGNOSIS — D500 Iron deficiency anemia secondary to blood loss (chronic): Secondary | ICD-10-CM

## 2016-07-10 DIAGNOSIS — I2510 Atherosclerotic heart disease of native coronary artery without angina pectoris: Secondary | ICD-10-CM

## 2016-07-10 DIAGNOSIS — Z7902 Long term (current) use of antithrombotics/antiplatelets: Secondary | ICD-10-CM | POA: Diagnosis not present

## 2016-07-10 DIAGNOSIS — F17210 Nicotine dependence, cigarettes, uncomplicated: Secondary | ICD-10-CM | POA: Diagnosis not present

## 2016-07-10 DIAGNOSIS — I119 Hypertensive heart disease without heart failure: Secondary | ICD-10-CM | POA: Diagnosis present

## 2016-07-10 DIAGNOSIS — E1165 Type 2 diabetes mellitus with hyperglycemia: Secondary | ICD-10-CM | POA: Diagnosis present

## 2016-07-10 DIAGNOSIS — Z882 Allergy status to sulfonamides status: Secondary | ICD-10-CM

## 2016-07-10 DIAGNOSIS — Z955 Presence of coronary angioplasty implant and graft: Secondary | ICD-10-CM

## 2016-07-10 DIAGNOSIS — Z8249 Family history of ischemic heart disease and other diseases of the circulatory system: Secondary | ICD-10-CM

## 2016-07-10 DIAGNOSIS — D509 Iron deficiency anemia, unspecified: Secondary | ICD-10-CM | POA: Diagnosis present

## 2016-07-10 DIAGNOSIS — E118 Type 2 diabetes mellitus with unspecified complications: Secondary | ICD-10-CM | POA: Diagnosis not present

## 2016-07-10 DIAGNOSIS — K219 Gastro-esophageal reflux disease without esophagitis: Secondary | ICD-10-CM | POA: Diagnosis present

## 2016-07-10 DIAGNOSIS — K2981 Duodenitis with bleeding: Secondary | ICD-10-CM | POA: Diagnosis present

## 2016-07-10 DIAGNOSIS — I10 Essential (primary) hypertension: Secondary | ICD-10-CM | POA: Diagnosis present

## 2016-07-10 DIAGNOSIS — E785 Hyperlipidemia, unspecified: Secondary | ICD-10-CM | POA: Diagnosis present

## 2016-07-10 DIAGNOSIS — Z881 Allergy status to other antibiotic agents status: Secondary | ICD-10-CM | POA: Diagnosis not present

## 2016-07-10 DIAGNOSIS — K922 Gastrointestinal hemorrhage, unspecified: Secondary | ICD-10-CM | POA: Diagnosis not present

## 2016-07-10 DIAGNOSIS — K222 Esophageal obstruction: Secondary | ICD-10-CM | POA: Diagnosis present

## 2016-07-10 DIAGNOSIS — Z7984 Long term (current) use of oral hypoglycemic drugs: Secondary | ICD-10-CM | POA: Diagnosis not present

## 2016-07-10 DIAGNOSIS — Z7982 Long term (current) use of aspirin: Secondary | ICD-10-CM

## 2016-07-10 DIAGNOSIS — Z880 Allergy status to penicillin: Secondary | ICD-10-CM | POA: Diagnosis not present

## 2016-07-10 DIAGNOSIS — Z885 Allergy status to narcotic agent status: Secondary | ICD-10-CM | POA: Diagnosis not present

## 2016-07-10 HISTORY — DX: Anemia, unspecified: D64.9

## 2016-07-10 LAB — COMPREHENSIVE METABOLIC PANEL
ALBUMIN: 3.4 g/dL — AB (ref 3.5–5.0)
ALK PHOS: 81 U/L (ref 38–126)
ALT: 14 U/L (ref 14–54)
AST: 23 U/L (ref 15–41)
Anion gap: 7 (ref 5–15)
BUN: 9 mg/dL (ref 6–20)
CALCIUM: 9.2 mg/dL (ref 8.9–10.3)
CHLORIDE: 100 mmol/L — AB (ref 101–111)
CO2: 26 mmol/L (ref 22–32)
CREATININE: 0.79 mg/dL (ref 0.44–1.00)
GFR calc non Af Amer: >60 mL/min (ref >60)
GLUCOSE: 222 mg/dL — AB (ref 65–99)
Potassium: 3.4 mmol/L — ABNORMAL LOW (ref 3.5–5.1)
SODIUM: 133 mmol/L — AB (ref 135–145)
Total Bilirubin: 0.4 mg/dL (ref 0.3–1.2)
Total Protein: 7.2 g/dL (ref 6.5–8.1)

## 2016-07-10 LAB — IRON AND TIBC
Iron: 13 ug/dL — ABNORMAL LOW (ref 28–170)
SATURATION RATIOS: 2 % — AB (ref 10.4–31.8)
TIBC: 525 ug/dL — ABNORMAL HIGH (ref 250–450)
UIBC: 512 ug/dL

## 2016-07-10 LAB — GLUCOSE, CAPILLARY
GLUCOSE-CAPILLARY: 142 mg/dL — AB (ref 65–99)
Glucose-Capillary: 79 mg/dL (ref 65–99)

## 2016-07-10 LAB — CBC
HCT: 22.2 % — ABNORMAL LOW (ref 36.0–46.0)
HEMOGLOBIN: 6 g/dL — AB (ref 12.0–15.0)
MCH: 19.9 pg — AB (ref 26.0–34.0)
MCHC: 27 g/dL — AB (ref 30.0–36.0)
MCV: 73.8 fL — AB (ref 78.0–100.0)
Platelets: 408 10*3/uL — ABNORMAL HIGH (ref 150–400)
RBC: 3.01 MIL/uL — AB (ref 3.87–5.11)
RDW: 19.2 % — ABNORMAL HIGH (ref 11.5–15.5)
WBC: 10 10*3/uL (ref 4.0–10.5)

## 2016-07-10 LAB — PROTIME-INR
INR: 1.02
Prothrombin Time: 13.5 seconds (ref 11.4–15.2)

## 2016-07-10 LAB — RETICULOCYTES
RBC.: 3.08 MIL/uL — AB (ref 3.87–5.11)
RETIC COUNT ABSOLUTE: 92.4 10*3/uL (ref 19.0–186.0)
RETIC CT PCT: 3 % (ref 0.4–3.1)

## 2016-07-10 LAB — FERRITIN: Ferritin: 4 ng/mL — ABNORMAL LOW (ref 11–307)

## 2016-07-10 LAB — FOLATE: Folate: 33.4 ng/mL (ref >5.9)

## 2016-07-10 LAB — POC OCCULT BLOOD, ED: FECAL OCCULT BLD: NEGATIVE

## 2016-07-10 LAB — ABO/RH: ABO/RH(D): B POS

## 2016-07-10 LAB — VITAMIN B12: VITAMIN B 12: 541 pg/mL (ref 180–914)

## 2016-07-10 LAB — PREPARE RBC (CROSSMATCH)

## 2016-07-10 MED ORDER — PANTOPRAZOLE SODIUM 40 MG PO TBEC
40.0000 mg | DELAYED_RELEASE_TABLET | Freq: Two times a day (BID) | ORAL | Status: DC
  Administered 2016-07-10: 40 mg via ORAL
  Filled 2016-07-10: qty 1

## 2016-07-10 MED ORDER — INSULIN ASPART 100 UNIT/ML ~~LOC~~ SOLN
0.0000 [IU] | Freq: Every day | SUBCUTANEOUS | Status: DC
Start: 2016-07-10 — End: 2016-07-12

## 2016-07-10 MED ORDER — CLOPIDOGREL BISULFATE 75 MG PO TABS: 75.0000 mg | ORAL_TABLET | Freq: Every day | ORAL | Status: DC

## 2016-07-10 MED ORDER — METFORMIN HCL 500 MG PO TABS: 1000.0000 mg | ORAL_TABLET | Freq: Two times a day (BID) | ORAL | Status: DC

## 2016-07-10 MED ORDER — SITAGLIPTIN PHOS-METFORMIN HCL 50-1000 MG PO TABS: 1.0000 | ORAL_TABLET | Freq: Two times a day (BID) | ORAL | Status: DC

## 2016-07-10 MED ORDER — ONDANSETRON HCL 4 MG PO TABS: 4.0000 mg | ORAL_TABLET | Freq: Four times a day (QID) | ORAL | Status: DC | PRN

## 2016-07-10 MED ORDER — COD LIVER OIL PO CAPS: 1.0000 | ORAL_CAPSULE | ORAL | Status: DC

## 2016-07-10 MED ORDER — ALBUTEROL SULFATE (2.5 MG/3ML) 0.083% IN NEBU: 2.5000 mg | INHALATION_SOLUTION | RESPIRATORY_TRACT | Status: DC | PRN

## 2016-07-10 MED ORDER — ASPIRIN EC 81 MG PO TBEC: 81.0000 mg | DELAYED_RELEASE_TABLET | Freq: Every day | ORAL | Status: DC

## 2016-07-10 MED ORDER — HYDROCHLOROTHIAZIDE 25 MG PO TABS: 25.0000 mg | ORAL_TABLET | Freq: Every morning | ORAL | Status: DC

## 2016-07-10 MED ORDER — ONDANSETRON HCL 4 MG/2ML IJ SOLN: 4.0000 mg | Freq: Four times a day (QID) | INTRAMUSCULAR | Status: DC | PRN

## 2016-07-10 MED ORDER — ALBUTEROL SULFATE (2.5 MG/3ML) 0.083% IN NEBU: 3.0000 mL | INHALATION_SOLUTION | Freq: Four times a day (QID) | RESPIRATORY_TRACT | Status: DC | PRN

## 2016-07-10 MED ORDER — ATENOLOL 50 MG PO TABS
50.0000 mg | ORAL_TABLET | Freq: Every day | ORAL | Status: DC
Start: 2016-07-11 — End: 2016-07-12
  Administered 2016-07-11 – 2016-07-12 (×2): 50 mg via ORAL
  Filled 2016-07-10 (×2): qty 1

## 2016-07-10 MED ORDER — ATORVASTATIN CALCIUM 80 MG PO TABS
80.0000 mg | ORAL_TABLET | Freq: Every day | ORAL | Status: DC
  Administered 2016-07-11: 80 mg via ORAL
  Filled 2016-07-10 (×2): qty 1

## 2016-07-10 MED ORDER — LOSARTAN POTASSIUM 50 MG PO TABS: 100.0000 mg | ORAL_TABLET | Freq: Every morning | ORAL | Status: DC

## 2016-07-10 MED ORDER — NITROGLYCERIN 0.4 MG SL SUBL: 0.4000 mg | SUBLINGUAL_TABLET | SUBLINGUAL | Status: DC | PRN

## 2016-07-10 MED ORDER — VITAMIN D 1000 UNITS PO TABS
2000.0000 [IU] | ORAL_TABLET | Freq: Every day | ORAL | Status: DC
  Administered 2016-07-11 – 2016-07-12 (×2): 2000 [IU] via ORAL
  Filled 2016-07-10 (×2): qty 2

## 2016-07-10 MED ORDER — LINAGLIPTIN 5 MG PO TABS: 5.0000 mg | ORAL_TABLET | Freq: Every day | ORAL | Status: DC

## 2016-07-10 MED ORDER — INSULIN ASPART 100 UNIT/ML ~~LOC~~ SOLN
0.0000 [IU] | Freq: Three times a day (TID) | SUBCUTANEOUS | Status: DC
Start: 2016-07-10 — End: 2016-07-12

## 2016-07-10 MED ORDER — SODIUM CHLORIDE 0.9 % IV SOLN
500.0000 mg | INTRAVENOUS | Status: DC
  Filled 2016-07-10: qty 10

## 2016-07-10 MED ORDER — POTASSIUM CHLORIDE CRYS ER 20 MEQ PO TBCR
40.0000 meq | EXTENDED_RELEASE_TABLET | Freq: Once | ORAL | Status: AC
  Administered 2016-07-10: 40 meq via ORAL
  Filled 2016-07-10: qty 2

## 2016-07-10 MED ORDER — SODIUM CHLORIDE 0.9 % IV SOLN
Freq: Once | INTRAVENOUS | Status: AC
  Administered 2016-07-10: 16:00:00 via INTRAVENOUS

## 2016-07-10 MED ORDER — BUPROPION HCL ER (SR) 150 MG PO TB12
150.0000 mg | ORAL_TABLET | Freq: Every day | ORAL | Status: DC
  Administered 2016-07-11 – 2016-07-12 (×2): 150 mg via ORAL
  Filled 2016-07-10 (×2): qty 1

## 2016-07-10 MED ORDER — SODIUM CHLORIDE 0.9 % IV SOLN
25.0000 mg | Freq: Once | INTRAVENOUS | Status: DC
  Filled 2016-07-10 (×2): qty 0.5

## 2016-07-10 MED ORDER — FUROSEMIDE 10 MG/ML IJ SOLN
20.0000 mg | Freq: Once | INTRAMUSCULAR | Status: DC
  Filled 2016-07-10: qty 2

## 2016-07-10 NOTE — Progress Notes (Signed)
Notified MD Candiss Norse patient blood sugar was 79 and had orders to get metformin and tradjenta. He said discontinue these medicines.

## 2016-07-10 NOTE — Consult Note (Signed)
Reason for Consult:   GI bleed  Requesting Physician: Dr Candiss Norse Primary Cardiologist Dr Tamala Julian  HPI:   Leah Norton is a 61 y.o. female who is being seen today for the evaluation of GI bleed at the request of Dr Candiss Norse  61 y/o female followed by Dr Tamala Julian. 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 She has a history of diabetes, hyper-lipidemia, hypertension, tobacco abuse, and remote nonobstructive coronary disease by angiography in 2005. A nuclear study in Dec 2017 demonstrated evidence of ischemia. Subsequent catheterization 02/29/16 demonstrated a 75-85% proximal LAD which was stented with DES. Brilinta caused dyspnea and was been discontinued and changed to Plavix. She last saw Dr Tamala Julian in Feb 2018. She went to her [facility] for a physical and lab work showed significant anemia and she was instructed to come to the ED. He Hgb is 6.0. She denies chest pain. She actually has only been mildly fatigued which she attributed to long hours at work. She admits to dark stool for some time but has never had GI bleeding or PUD.    PMHx:  Past Medical History:  Diagnosis Date  . Anginal pain ([place])   . Arthritis    "right anikle" (02/29/2016)  . CAD in native artery 02/08/2016   Three-vessel coronary artery calcification on CT scan  . Complication of anesthesia    "had reaction w/hives, rash, itching" (02/29/2016)  . Constipation   . Coronary artery disease    12/17 PCI with DESx1 to pLAD EF normal  . Enlarged heart   . GERD (gastroesophageal reflux disease)   . High cholesterol   . Hypertension   . Migraine    hx  . Type II diabetes mellitus ([place])     Past Surgical History:  Procedure Laterality Date  . ABDOMINAL HYSTERECTOMY    . CARDIAC CATHETERIZATION N/A 02/29/2016   Procedure: Left Heart Cath and Coronary Angiography;  Surgeon: Belva Crome, MD;  Location: [place] [facility];  Service: Cardiovascular;  Laterality: N/A;  . CARDIAC CATHETERIZATION N/A  02/29/2016   Procedure: Coronary Stent Intervention;  Surgeon: Belva Crome, MD;  Location: [place] [facility];  Service: Cardiovascular;  Laterality: N/A;  . CARDIAC CATHETERIZATION  2005   "no blockages"  . COLONOSCOPY WITH PROPOFOL N/A 03/15/2014   Procedure: COLONOSCOPY WITH PROPOFOL;  Surgeon: Garlan Fair, MD;  [facility];  Service: Endoscopy;  Laterality: N/A;  . FRACTURE SURGERY    . MULTIPLE TOOTH EXTRACTIONS     teeth removed  . ORIF ANKLE FRACTURE Right 11/12/2012   Procedure: OPEN REDUCTION INTERNAL FIXATION (ORIF) ANKLE FRACTURE;  Surgeon: Marianna Payment, MD;  Location: [facility];  Service: Orthopedics;  Laterality: Right;  Open reduction internal fixation ankle fracture, trimalleolar    SOCHx:  reports that she has been smoking Cigarettes.  She has a 20.00 pack-year smoking history. She has never used smokeless tobacco. She reports that she drinks alcohol. She reports that she does not use drugs.  FAMHx: Family History  Problem Relation Age of Onset  . Hypertension Mother     ALLERGIES: Allergies  Allergen Reactions  . Codeine Rash  . Erythromycin Rash  . Penicillins Rash    Has patient had a PCN reaction causing immediate rash, facial/tongue/throat swelling, SOB or lightheadedness with hypotension: Yes Has patient had a PCN reaction causing severe rash involving mucus membranes or skin necrosis: No Has patient had a PCN reaction  that required hospitalization: No Has patient had a PCN reaction occurring within the last 10 years: Yes If all of the above answers are "NO", then may proceed with Cephalosporin use.   . Sulfonamide Derivatives Rash  . Vicodin [Hydrocodone-Acetaminophen] Rash    ROS: Review of Systems: General: negative for chills, fever, night sweats or weight changes.  Cardiovascular: negative for chest pain, dyspnea on exertion, edema, orthopnea, palpitations, paroxysmal nocturnal dyspnea or shortness of breath HEENT: negative for any  visual disturbances, blindness, glaucoma Dermatological: negative for rash Respiratory: negative for cough, hemoptysis, or wheezing Urologic: negative for hematuria or dysuria Abdominal: negative for nausea, vomiting, diarrhea, bright red blood per rectum, or hematemesis Neurologic: negative for visual changes, syncope, or dizziness Musculoskeletal: negative for back pain, joint pain, or swelling Psych: cooperative and appropriate All other systems reviewed and are otherwise negative except as noted above.   HOME MEDICATIONS: Prior to Admission medications   Medication Sig Start Date End Date Taking? Authorizing Provider  albuterol (PROVENTIL HFA;VENTOLIN HFA) 108 (90 BASE) MCG/ACT inhaler Inhale 1-2 puffs into the lungs every 6 (six) hours as needed for wheezing. 12/08/11  Yes Fransico Meadow, PA-C  aspirin EC 81 MG tablet Take 1 tablet (81 mg total) by mouth daily. 04/18/16  Yes Burnell Blanks, MD  atenolol (TENORMIN) 50 MG tablet Take 50 mg by mouth daily.    Yes Historical Provider, MD  buPROPion (WELLBUTRIN SR) 150 MG 12 hr tablet Take 150 mg by mouth daily.    Yes Historical Provider, MD  Cholecalciferol (VITAMIN D) 2000 units CAPS Take 2,000 Units by mouth daily.   Yes Historical Provider, MD  clopidogrel (PLAVIX) 75 MG tablet Take 75 mg by mouth daily.   Yes Historical Provider, MD  [facility] Liver Oil CAPS Take 1 capsule by mouth See admin instructions. One to two times a day   Yes Historical Provider, MD  hydrochlorothiazide (HYDRODIURIL) 25 MG tablet Take 25 mg by mouth every morning.    Yes Historical Provider, MD  losartan (COZAAR) 100 MG tablet Take 1 tablet (100 mg total) by mouth every morning. 03/01/16  Yes Cheryln Manly, NP  nitroGLYCERIN (NITROSTAT) 0.4 MG SL tablet Place 0.4 mg under the tongue every 5 (five) minutes as needed for chest pain (Call 911 at 3rd dose within 15 minutes.).   Yes Historical Provider, MD  pantoprazole (PROTONIX) 40 MG tablet Take 1 tablet (40 mg  total) by mouth daily. 03/09/16  Yes Isaiah Serge, NP  sitaGLIPtin-metformin (JANUMET) 50-1000 MG per tablet Take 1 tablet by mouth 2 (two) times daily with a meal.   Yes Historical Provider, MD  atorvastatin (LIPITOR) 80 MG tablet Take 1 tablet (80 mg total) by mouth daily at 6 PM. Patient not taking: Reported on 07/10/2016 03/01/16   Cheryln Manly, NP    HOSPITAL MEDICATIONS: I have reviewed the patient's current medications.  VITALS: Blood pressure (!) 124/59, pulse 81, temperature 98.7 F (37.1 C), temperature source Oral, resp. rate (!) 21, SpO2 100 %.  PHYSICAL EXAM: General appearance: alert, cooperative, no distress and mildly obese Neck: no carotid bruit and no JVD Lungs: clear to auscultation bilaterally Heart: regular rate and rhythm and soft systolic murmur Abdomen: obese, non tender Extremities: extremities normal, atraumatic, no cyanosis or edema Pulses: 2+ and symmetric Skin: Skin color, texture, turgor normal. No rashes or lesions Neurologic: Grossly normal  LABS: Results for orders placed or performed during the hospital encounter of 07/10/16 (from the past 24 hour(s))  Comprehensive metabolic panel     Status: Abnormal   Collection Time: 07/10/16  1:34 PM  Result Value Ref Range   Sodium 133 (L) 135 - 145 mmol/L   Potassium 3.4 (L) 3.5 - 5.1 mmol/L   Chloride 100 (L) 101 - 111 mmol/L   CO2 26 22 - 32 mmol/L   Glucose, Bld 222 (H) 65 - 99 mg/dL   BUN 9 6 - 20 mg/dL   Creatinine, Ser 0.79 0.44 - 1.00 mg/dL   Calcium 9.2 8.9 - 10.3 mg/dL   Total Protein 7.2 6.5 - 8.1 g/dL   Albumin 3.4 (L) 3.5 - 5.0 g/dL   AST 23 15 - 41 U/L   ALT 14 14 - 54 U/L   Alkaline Phosphatase 81 38 - 126 U/L   Total Bilirubin 0.4 0.3 - 1.2 mg/dL   GFR calc non Af Amer >60 >60 mL/min   GFR calc Af Amer >60 >60 mL/min   Anion gap 7 5 - 15  CBC     Status: Abnormal   Collection Time: 07/10/16  1:34 PM  Result Value Ref Range   WBC 10.0 4.0 - 10.5 K/uL   RBC 3.01 (L) 3.87 -  5.11 MIL/uL   Hemoglobin 6.0 (LL) 12.0 - 15.0 g/dL   HCT 22.2 (L) 36.0 - 46.0 %   MCV 73.8 (L) 78.0 - 100.0 fL   MCH 19.9 (L) 26.0 - 34.0 pg   MCHC 27.0 (L) 30.0 - 36.0 g/dL   RDW 19.2 (H) 11.5 - 15.5 %   Platelets 408 (H) 150 - 400 K/uL  Type and screen [place]     Status: None (Preliminary result)   Collection Time: 07/10/16  1:45 PM  Result Value Ref Range   ABO/RH(D) B POS    Antibody Screen NEG    Sample Expiration 07/13/2016    Unit Number [id number]    Blood Component Type RED CELLS,LR    Unit division 00    Status of Unit ISSUED    Transfusion Status OK TO TRANSFUSE    Crossmatch Result Compatible    Unit Number [id number]    Blood Component Type RED CELLS,LR    Unit division 00    Status of Unit ALLOCATED    Transfusion Status OK TO TRANSFUSE    Crossmatch Result Compatible   ABO/Rh     Status: None   Collection Time: 07/10/16  1:45 PM  Result Value Ref Range   ABO/RH(D) B POS   Vitamin B12     Status: None   Collection Time: 07/10/16  2:02 PM  Result Value Ref Range   Vitamin B-12 541 180 - 914 pg/mL  Folate     Status: None   Collection Time: 07/10/16  2:02 PM  Result Value Ref Range   Folate 33.4 >5.9 ng/mL  Iron and TIBC     Status: Abnormal   Collection Time: 07/10/16  2:02 PM  Result Value Ref Range   Iron 13 (L) 28 - 170 ug/dL   TIBC 525 (H) 250 - 450 ug/dL   Saturation Ratios 2 (L) 10.4 - 31.8 %   UIBC 512 ug/dL  Ferritin     Status: Abnormal   Collection Time: 07/10/16  2:02 PM  Result Value Ref Range   Ferritin 4 (L) 11 - 307 ng/mL  Reticulocytes     Status: Abnormal   Collection Time: 07/10/16  2:02 PM  Result Value Ref Range   Retic Ct Pct 3.0  0.4 - 3.1 %   RBC. 3.08 (L) 3.87 - 5.11 MIL/uL   Retic Count, Manual 92.4 19.0 - 186.0 K/uL  Protime-INR     Status: None   Collection Time: 07/10/16  2:02 PM  Result Value Ref Range   Prothrombin Time 13.5 11.4 - 15.2 seconds   INR 1.02   Prepare RBC     Status: None    Collection Time: 07/10/16  2:33 PM  Result Value Ref Range   Order Confirmation ORDER PROCESSED BY BLOOD BANK   POC occult blood, ED RN will collect     Status: None   Collection Time: 07/10/16  3:02 PM  Result Value Ref Range   Fecal Occult Bld NEGATIVE NEGATIVE    EKG: pending  IMAGING: No results found.  IMPRESSION: Principal Problem:   GI bleed Active Problems:   DM (diabetes mellitus), type 2, uncontrolled with complications (HCC)   ANEMIA   Cigarette smoker   Essential hypertension   CAD S/P percutaneous coronary angioplasty   RECOMMENDATION: MD to see.Hold ASA and Plavix for now with serious anemia and GI bleed. Will follow  Time Spent Directly with Patient: 45 minutes  Kerin Ransom, [place] beeper 07/10/2016, 5:08 PM   I have personally seen and examined this patient with Kerin Ransom, PA-C. I agree with the assessment and plan as outlined above. She is admitted with weakness and is found to have dark stools with Hgb of 6.0. Her stool heme check today is negative. She reports no chest pain or dyspnea. She has had dark bowel movements "on/off' for several months. She has been taking ASA/Plavix daily. She had a drug eluting stent placed in the proximal LAD on 02/29/16.  My exam shows a WDWN female in NAD. She is alert and oriented x 3. Neck: no JVD. Her mucus membranes are moist. CV:RRR with systolic murmur. Pulm: clear bilaterally. Abd: soft, NT, ND. Ext: no edema No EKG has been performed today. I will order an EKG tonight.  I have reviewed her labs. Hgb 6.0. Creatinine 0.79 Plan: Anemia with coronary stent placement 4.5 months ago: She has had dark stools and now has Hgb 6.0. While it would be optimal to finish 6 months of dual anti-platelet therapy without interruption, we will have to hold her ASA and Plavix. This should be ok but there is the possibility of stent thrombosis. I have reviewed this risk with the patient and her family. GI consult is pending. We  will follow along with you.   Lauree Chandler 07/10/2016 5:55 PM

## 2016-07-10 NOTE — ED Notes (Signed)
Admitting at bedside 

## 2016-07-10 NOTE — Progress Notes (Signed)
MEDICATION RELATED CONSULT NOTE  Pharmacy Consult for IV iron Indication: iron deficient anemia  Labs:  Recent Labs  07/10/16 1334  WBC 10.0  HGB 6.0*  HCT 22.2*  PLT 408*  CREATININE 0.79  ALBUMIN 3.4*  PROT 7.2  AST 23  ALT 14  ALKPHOS 81  BILITOT 0.4   CrCl cannot be calculated (Unknown ideal weight.).  Medical History: Past Medical History:  Diagnosis Date  . Anginal pain ([place])   . Arthritis    "right anikle" (02/29/2016)  . CAD in native artery 02/08/2016   Three-vessel coronary artery calcification on CT scan  . Complication of anesthesia    "had reaction w/hives, rash, itching" (02/29/2016)  . Constipation   . Coronary artery disease    12/17 PCI with DESx1 to pLAD EF normal  . Enlarged heart   . GERD (gastroesophageal reflux disease)   . High cholesterol   . Hypertension   . Migraine    hx  . Type II diabetes mellitus [facility])    Assessment: 61yo F admitted 07/10/16 for symptomatic anemia. Hgb 6.0, iron 13, [place] 2%, ferritin 4. Patient planned to receive 2 units pRBCs. Pharmacy has been consulted to start IV iron dextran tomorrow.  Plan:  Give iron dextran test dose tomorrow at 0800 If patient tolerates, give 500 mg Q24H x 2 doses   Gwenlyn Perking, PharmD PGY1 Pharmacy Resident Pager: [phone] 07/10/2016 4:19 PM

## 2016-07-10 NOTE — ED Provider Notes (Signed)
[facility] DEPT Provider Note   CSN: [id number] Arrival date & time: 07/10/16  1252     History   Chief Complaint Chief Complaint  Patient presents with  . Abnormal Lab    HPI Leah Norton is a 61 y.o. female.  HPI The patient presents to the emergency room for evaluation of abnormal blood tests.  The patient states she was seen by her primary care doctor today. She had mentioned trouble with fatigue. She also mentioned she was having some intermittent dark stools. She does take Plavix. The patient states her doctor did some blood tests. She was told she was anemic and that she needed to come to the hospital.  Patient denies any nausea or vomiting. No abdominal pain or chest pain. She denies any other complaints at this time. Past Medical History:  Diagnosis Date  . Anginal pain ([place])   . Arthritis    "right anikle" (02/29/2016)  . CAD in native artery 02/08/2016   Three-vessel coronary artery calcification on CT scan  . Complication of anesthesia    "had reaction w/hives, rash, itching" (02/29/2016)  . Constipation   . Coronary artery disease    12/17 PCI with DESx1 to pLAD EF normal  . Enlarged heart   . GERD (gastroesophageal reflux disease)   . High cholesterol   . Hypertension   . Migraine    hx  . Type II diabetes mellitus [facility])     Patient Active Problem List   Diagnosis Date Noted  . Morbid obesity due to excess calories ([place]) 05/14/2016  . Dyspnea on exertion 04/29/2016  . Abnormal nuclear stress test   . CAD in native artery 02/08/2016  . INSOMNIA, CHRONIC 02/28/2010  . HEADACHE 02/28/2010  . HEEL PAIN, RIGHT 11/29/2009  . COUGH DUE TO ACE INHIBITORS 11/29/2009  . ATRIAL ENLARGEMENT, LEFT 03/21/2009  . ANEMIA 12/28/2008  . CARDIAC MURMUR 12/28/2008  . OTHER NONSPECIFIC ABNORMAL SERUM ENZYME LEVELS 12/28/2008  . CERVICAL MUSCLE STRAIN 12/28/2008  . CONSTIPATION 06/01/2008  . POSTMENOPAUSAL STATUS 05/15/2008  . BRONCHITIS, CHRONIC 12/18/2007    . Cigarette smoker 08/19/2007  . DEGENERATIVE DISC DISEASE, LUMBOSACRAL SPINE 08/19/2007  . LOW BACK PAIN SYNDROME 08/19/2007  . UPPER RESPIRATORY INFECTION, VIRAL 03/05/2007  . NEOPLASM, SKIN, UNCERTAIN BEHAVIOR 66/59/9357  . GANGLION CYST, WRIST, LEFT 01/28/2007  . DENTAL PAIN 01/19/2007  . DM (diabetes mellitus), type 2, uncontrolled with complications ([place]) 01/77/9390  . Hyperlipidemia 12/10/2006  . Essential hypertension 12/10/2006  . RHINITIS, ALLERGIC NEC 12/10/2006  . HIP PAIN, RIGHT 12/10/2006    Past Surgical History:  Procedure Laterality Date  . ABDOMINAL HYSTERECTOMY    . CARDIAC CATHETERIZATION N/A 02/29/2016   Procedure: Left Heart Cath and Coronary Angiography;  Surgeon: Belva Crome, MD;  Location: [place] [facility];  Service: Cardiovascular;  Laterality: N/A;  . CARDIAC CATHETERIZATION N/A 02/29/2016   Procedure: Coronary Stent Intervention;  Surgeon: Belva Crome, MD;  Location: [place] [facility];  Service: Cardiovascular;  Laterality: N/A;  . CARDIAC CATHETERIZATION  2005   "no blockages"  . COLONOSCOPY WITH PROPOFOL N/A 03/15/2014   Procedure: COLONOSCOPY WITH PROPOFOL;  Surgeon: Garlan Fair, MD;  [facility];  Service: Endoscopy;  Laterality: N/A;  . FRACTURE SURGERY    . MULTIPLE TOOTH EXTRACTIONS     teeth removed  . ORIF ANKLE FRACTURE Right 11/12/2012   Procedure: OPEN REDUCTION INTERNAL FIXATION (ORIF) ANKLE FRACTURE;  Surgeon: Marianna Payment, MD;  Location: [place];  Service:  Orthopedics;  Laterality: Right;  Open reduction internal fixation ankle fracture, trimalleolar    OB History    No data available       Home Medications    Prior to Admission medications   Medication Sig Start Date End Date Taking? Authorizing Provider  albuterol (PROVENTIL HFA;VENTOLIN HFA) 108 (90 BASE) MCG/ACT inhaler Inhale 1-2 puffs into the lungs every 6 (six) hours as needed for wheezing. 12/08/11  Yes Fransico Meadow, PA-C  aspirin EC 81 MG tablet  Take 1 tablet (81 mg total) by mouth daily. 04/18/16  Yes Burnell Blanks, MD  atenolol (TENORMIN) 50 MG tablet Take 50 mg by mouth daily.    Yes Historical Provider, MD  buPROPion (WELLBUTRIN SR) 150 MG 12 hr tablet Take 150 mg by mouth daily.    Yes Historical Provider, MD  Cholecalciferol (VITAMIN D) 2000 units CAPS Take 2,000 Units by mouth daily.   Yes Historical Provider, MD  clopidogrel (PLAVIX) 75 MG tablet Take 75 mg by mouth daily.   Yes Historical Provider, MD  [facility] Liver Oil CAPS Take 1 capsule by mouth See admin instructions. One to two times a day   Yes Historical Provider, MD  hydrochlorothiazide (HYDRODIURIL) 25 MG tablet Take 25 mg by mouth every morning.    Yes Historical Provider, MD  losartan (COZAAR) 100 MG tablet Take 1 tablet (100 mg total) by mouth every morning. 03/01/16  Yes Cheryln Manly, NP  nitroGLYCERIN (NITROSTAT) 0.4 MG SL tablet Place 0.4 mg under the tongue every 5 (five) minutes as needed for chest pain (Call 911 at 3rd dose within 15 minutes.).   Yes Historical Provider, MD  pantoprazole (PROTONIX) 40 MG tablet Take 1 tablet (40 mg total) by mouth daily. 03/09/16  Yes Isaiah Serge, NP  sitaGLIPtin-metformin (JANUMET) 50-1000 MG per tablet Take 1 tablet by mouth 2 (two) times daily with a meal.   Yes Historical Provider, MD  atorvastatin (LIPITOR) 80 MG tablet Take 1 tablet (80 mg total) by mouth daily at 6 PM. Patient not taking: Reported on 07/10/2016 03/01/16   Cheryln Manly, NP    Family History Family History  Problem Relation Age of Onset  . Hypertension Mother     Social History Social History  Substance Use Topics  . Smoking status: Current Every Day Smoker    Packs/day: 0.50    Years: 40.00    Types: Cigarettes  . Smokeless tobacco: Never Used     Comment: down to 2-5 cigarettes daily 05/14/2016  . Alcohol use Yes     Comment: 02/29/2016 "I'll have some wine or mixed drinks a few times/year; special occasions"     Allergies     Codeine; Erythromycin; Penicillins; Sulfonamide derivatives; and Vicodin [hydrocodone-acetaminophen]   Review of Systems Review of Systems  All other systems reviewed and are negative.    Physical Exam Updated Vital Signs BP 112/73 (BP Location: Right Arm)   Pulse 77   Temp 98.2 F (36.8 C) (Oral)   Resp 18   SpO2 99%   Physical Exam  Constitutional: She appears well-developed and well-nourished. No distress.  HENT:  Head: Normocephalic and atraumatic.  Right Ear: External ear normal.  Left Ear: External ear normal.  Eyes: Right eye exhibits no discharge. Left eye exhibits no discharge. No scleral icterus.  Conjunctivae are pale  Neck: Neck supple. No tracheal deviation present.  Cardiovascular: Normal rate, regular rhythm and intact distal pulses.   Pulmonary/Chest: Effort normal and breath sounds normal.  No stridor. No respiratory distress. She has no wheezes. She has no rales.  Abdominal: Soft. Bowel sounds are normal. She exhibits no distension. There is no tenderness. There is no rebound and no guarding.  Musculoskeletal: She exhibits no edema or tenderness.  Neurological: She is alert. She has normal strength. No cranial nerve deficit (no facial droop, extraocular movements intact, no slurred speech) or sensory deficit. She exhibits normal muscle tone. She displays no seizure activity. Coordination normal.  Skin: Skin is warm and dry. No rash noted.  Psychiatric: She has a normal mood and affect.  Nursing note and vitals reviewed.    ED Treatments / Results  Labs (all labs ordered are listed, but only abnormal results are displayed) Labs Reviewed  COMPREHENSIVE METABOLIC PANEL - Abnormal; Notable for the following:       Result Value   Sodium 133 (*)    Potassium 3.4 (*)    Chloride 100 (*)    Glucose, Bld 222 (*)    Albumin 3.4 (*)    All other components within normal limits  CBC - Abnormal; Notable for the following:    RBC 3.01 (*)    Hemoglobin 6.0 (*)     HCT 22.2 (*)    MCV 73.8 (*)    MCH 19.9 (*)    MCHC 27.0 (*)    RDW 19.2 (*)    Platelets 408 (*)    All other components within normal limits  IRON AND TIBC - Abnormal; Notable for the following:    Iron 13 (*)    TIBC 525 (*)    Saturation Ratios 2 (*)    All other components within normal limits  FERRITIN - Abnormal; Notable for the following:    Ferritin 4 (*)    All other components within normal limits  RETICULOCYTES - Abnormal; Notable for the following:    RBC. 3.08 (*)    All other components within normal limits  VITAMIN B12  PROTIME-INR  FOLATE  POC OCCULT BLOOD, ED  TYPE AND SCREEN  PREPARE RBC (CROSSMATCH)  ABO/RH   Procedures Procedures (including critical care time)  Medications Ordered in ED Medications  0.9 %  sodium chloride infusion (not administered)  furosemide (LASIX) injection 20 mg (not administered)     Initial Impression / Assessment and Plan / ED Course  I have reviewed the triage vital signs and the nursing notes.  Pertinent labs & imaging results that were available during my care of the patient were reviewed by me and considered in my medical decision making (see chart for details).  Clinical Course as of Jul 11 1546  Tue Jul 10, 2016  1404 I attempted to access care everywhere to review the records from her doctor's office today.  I received an error code and am unable to access those records.  [JK]    Clinical Course User Index [JK] Dorie Rank, MD    The patient presented to the emergency room for evaluation of anemia. She was seen by her primary doctor's office and had abnormal labs. Patient mentions having dark stools at home. Her hemoglobin is 6. I have ordered blood transfusions. Hemoccult is negative. Nothing to suggest active GI bleeding at this time. I will consult the medical service for admission and further evaluation.  Final Clinical Impressions(s) / ED Diagnoses   Final diagnoses:  Symptomatic anemia      Dorie Rank, MD 07/10/16 1550

## 2016-07-10 NOTE — Progress Notes (Signed)
Patient arrived to 6N28 from ED, alert and oriented, no pain, family at bedside. IV fluids running, VSS. Will continue to monitor.

## 2016-07-10 NOTE — ED Notes (Signed)
Attempted report x1. 

## 2016-07-10 NOTE — H&P (Signed)
TRH H&P   Patient Demographics:    Leah Norton, is a 61 y.o. female  MRN: [id number]   DOB - 04-02-1955  Admit Date - 07/10/2016  Outpatient Primary MD for the patient is Kandice Hams, MD  Outpatient Specialists: Dr Linard Millers, Dr M.Johnson    Patient coming from: Home  Chief Complaint  Patient presents with  . Abnormal Lab      HPI:    Leah Norton  is a 61 y.o. 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 Her Hemoccult stool was negative 1 and her BUN was stable, I was called to admit the patient.  She is currently symptom free except for some generalized fatigue which gets worse with exertion, no headache no chest pain, no shortness of breath, no abdominal pain or nausea, she has had one colonoscopy by Dr. Howell Rucks 2 years ago which was unremarkable, denies any unintentional weight loss, no personal or family history of GI malignancies or polyp, again she does say that intermittently she has dark stools for a long time.    Review of systems:    In addition to the HPI above,   No Fever-chills, No Headache, No changes with Vision or hearing, No problems swallowing food or Liquids, No Chest pain, Cough or Shortness of Breath, No Abdominal pain, No Nausea or Vommitting, Bowel movements are regular, No Blood in stool or Urine, No dysuria, No new skin rashes or bruises, No new joints pains-aches,    No new weakness, tingling, numbness in any extremity,Generalized weakness as above No recent weight gain or loss, No polyuria, polydypsia or polyphagia, No significant Mental Stressors.  A full 10 point Review of Systems was done, except as stated above, all other Review of Systems were negative.   With Past History of the following :    Past Medical History:  Diagnosis Date  . Anginal pain ([place])   . Arthritis    "right anikle" (02/29/2016)  . CAD in native artery 02/08/2016   Three-vessel coronary artery calcification on CT  scan  . Complication of anesthesia    "had reaction w/hives, rash, itching" (02/29/2016)  . Constipation   . Coronary artery disease    12/17 PCI with DESx1 to pLAD EF normal  . Enlarged heart   . GERD (gastroesophageal reflux disease)   . High cholesterol   . Hypertension   . Migraine    hx  . Type II diabetes mellitus ([place])       Past Surgical History:  Procedure Laterality Date  . ABDOMINAL HYSTERECTOMY    . CARDIAC CATHETERIZATION N/A 02/29/2016   Procedure: Left Heart Cath and Coronary Angiography;  Surgeon: Belva Crome, MD;  Location: [facility];  Service: Cardiovascular;  Laterality: N/A;  . CARDIAC CATHETERIZATION N/A 02/29/2016   Procedure: Coronary Stent Intervention;  Surgeon: Belva Crome, MD;  Location: [facility];  Service: Cardiovascular;  Laterality: N/A;  . CARDIAC CATHETERIZATION  2005   "no blockages"  . COLONOSCOPY WITH PROPOFOL N/A 03/15/2014   Procedure: COLONOSCOPY WITH PROPOFOL;  Surgeon: Garlan Fair, MD;  Location: [facility];  Service: Endoscopy;  Laterality: N/A;  . FRACTURE SURGERY    . MULTIPLE TOOTH EXTRACTIONS     teeth removed  . ORIF ANKLE FRACTURE Right 11/12/2012   Procedure: OPEN REDUCTION INTERNAL FIXATION (ORIF) ANKLE FRACTURE;  Surgeon: Marianna Payment, MD;  Location: [facility];  Service: Orthopedics;  Laterality: Right;  Open reduction internal fixation ankle fracture, trimalleolar       Social History:     Social History  Substance Use Topics  . Smoking status: Current Every Day Smoker    Packs/day: 0.50    Years: 40.00    Types: Cigarettes  . Smokeless tobacco: Never Used     Comment: down to 2-5 cigarettes daily 05/14/2016  . Alcohol use Yes     Comment: 02/29/2016 "I'll have some wine or mixed drinks a few times/year; special occasions"         Family History :     Family History  Problem Relation Age of Onset  . Hypertension Mother        Home Medications:   Prior to Admission medications   Medication Sig Start Date End Date Taking? Authorizing Provider  albuterol (PROVENTIL HFA;VENTOLIN HFA) 108 (90 BASE) MCG/ACT inhaler Inhale 1-2 puffs into the lungs every 6 (six) hours as needed for wheezing. 12/08/11  Yes Fransico Meadow, PA-C  aspirin EC 81 MG tablet Take 1 tablet (81 mg total) by mouth daily. 04/18/16  Yes Burnell Blanks, MD  atenolol (TENORMIN) 50 MG tablet Take 50 mg by mouth daily.    Yes Historical Provider, MD  buPROPion (WELLBUTRIN SR) 150 MG 12 hr tablet Take 150 mg by mouth daily.    Yes Historical Provider, MD  Cholecalciferol (VITAMIN D) 2000 units CAPS Take 2,000 Units by mouth daily.   Yes Historical Provider, MD  clopidogrel (PLAVIX) 75 MG tablet Take 75 mg by mouth daily.   Yes Historical Provider, MD  [facility] Liver Oil CAPS Take 1 capsule by mouth See admin instructions. One to two times a day   Yes Historical Provider, MD  hydrochlorothiazide (HYDRODIURIL) 25 MG tablet Take 25 mg by mouth every morning.    Yes Historical Provider, MD  losartan (COZAAR) 100 MG tablet Take 1 tablet (100 mg total) by mouth every morning. 03/01/16  Yes Cheryln Manly, NP  nitroGLYCERIN (NITROSTAT) 0.4 MG SL tablet Place 0.4 mg under the tongue every 5 (five) minutes as  needed for chest pain (Call 911 at 3rd dose within 15 minutes.).   Yes Historical Provider, MD  pantoprazole (PROTONIX) 40 MG tablet Take 1 tablet (40 mg total) by mouth daily.  03/09/16  Yes Isaiah Serge, NP  sitaGLIPtin-metformin (JANUMET) 50-1000 MG per tablet Take 1 tablet by mouth 2 (two) times daily with a meal.   Yes Historical Provider, MD  atorvastatin (LIPITOR) 80 MG tablet Take 1 tablet (80 mg total) by mouth daily at 6 PM. Patient not taking: Reported on 07/10/2016 03/01/16   Cheryln Manly, NP     Allergies:     Allergies  Allergen Reactions  . Codeine Rash  . Erythromycin Rash  . Penicillins Rash    Has patient had a PCN reaction causing immediate rash, facial/tongue/throat swelling, SOB or lightheadedness with hypotension: Yes Has patient had a PCN reaction causing severe rash involving mucus membranes or skin necrosis: No Has patient had a PCN reaction that required hospitalization: No Has patient had a PCN reaction occurring within the last 10 years: Yes If all of the above answers are "NO", then may proceed with Cephalosporin use.   . Sulfonamide Derivatives Rash  . Vicodin [Hydrocodone-Acetaminophen] Rash     Physical Exam:   Vitals  Blood pressure 112/73, pulse 77, temperature 98.2 F (36.8 C), temperature source Oral, resp. rate 18, SpO2 99 %.   1. General Middle-aged African-American female lying in bed in NAD,    2. Normal affect and insight, Not Suicidal or Homicidal, Awake Alert, Oriented X 3.  3. No F.N deficits, ALL C.Nerves Intact, Strength 5/5 all 4 extremities, Sensation intact all 4 extremities, Plantars down going.  4. Ears and Eyes appear Normal, Conjunctivae clear, PERRLA. Moist Oral Mucosa.  5. Supple Neck, No JVD, No cervical lymphadenopathy appriciated, No Carotid Bruits.  6. Symmetrical Chest wall movement, Good air movement bilaterally, CTAB.  7. RRR, No Gallops, Rubs or Murmurs, No Parasternal Heave.  8. Positive Bowel Sounds, Abdomen Soft, No tenderness, No organomegaly appriciated,No rebound -guarding or rigidity.  9.  No Cyanosis, Normal Skin Turgor, No Skin Rash or Bruise.  10. Good muscle tone,   joints appear normal , no effusions, Normal ROM.  11. No Palpable Lymph Nodes in Neck or Axillae      Data Review:    CBC  Recent Labs Lab 07/10/16 1334  WBC 10.0  HGB 6.0*  HCT 22.2*  PLT 408*  MCV 73.8*  MCH 19.9*  MCHC 27.0*  RDW 19.2*   ------------------------------------------------------------------------------------------------------------------  Chemistries   Recent Labs Lab 07/10/16 1334  NA 133*  K 3.4*  CL 100*  CO2 26  GLUCOSE 222*  BUN 9  CREATININE 0.79  CALCIUM 9.2  AST 23  ALT 14  ALKPHOS 81  BILITOT 0.4   ------------------------------------------------------------------------------------------------------------------ CrCl cannot be calculated (Unknown ideal weight.). ------------------------------------------------------------------------------------------------------------------ No results for input(s): TSH, T4TOTAL, T3FREE, THYROIDAB in the last 72 hours.  Invalid input(s): FREET3  Coagulation profile  Recent Labs Lab 07/10/16 1402  INR 1.02   ------------------------------------------------------------------------------------------------------------------- No results for input(s): DDIMER in the last 72 hours. -------------------------------------------------------------------------------------------------------------------  Cardiac Enzymes No results for input(s): CKMB, TROPONINI, MYOGLOBIN in the last 168 hours.  Invalid input(s): CK ------------------------------------------------------------------------------------------------------------------ No results found for: BNP   ---------------------------------------------------------------------------------------------------------------  Urinalysis    Component Value Date/Time   COLORURINE YELLOW 04/03/2011 [place] 04/03/2011 2202   LABSPEC 1.017 04/03/2011 2202   PHURINE 5.5 04/03/2011 2202   GLUCOSEU NEGATIVE 04/03/2011 2202   HGBUR NEGATIVE  04/03/2011 2202   HGBUR negative 03/24/2008 [place] 04/03/2011 2202   KETONESUR NEGATIVE 04/03/2011 2202   PROTEINUR NEGATIVE 04/03/2011 2202   UROBILINOGEN 0.2 04/03/2011 2202   NITRITE NEGATIVE 04/03/2011 2202   LEUKOCYTESUR NEGATIVE 04/03/2011 2202    ----------------------------------------------------------------------------------------------------------------   Imaging Results:    No results found.      Assessment & Plan:    Principal Problem:   ANEMIA Active Problems:   DM (diabetes mellitus), type 2, uncontrolled with complications (HCC)   Cigarette smoker   CAD in native artery   GI bleed    1. Severe symptomatic iron deficiency anemia likely due to intermittent GI blood loss. Hemoccult-negative here 1, she will be admitted, 2 units of packed RBC transfusion, IV iron tomorrow morning, twice a day oral PPI, have requested GI to evaluate. For now will have to continue aspirin and Plavix due to recent placement of drug-eluting stent 5 months ago. Have requested cardiology to provide opinion as well as we might have to hold antiplatelet medications as becomes necessary.  2. CAD status post drug-eluting stent placement in December 2017. Currently chest pain-free, no acute issues, cardiac medications for now will be continued which include aspirin-Plavix-statin along with beta blocker for secondary prevention. Cardiology has been requested to evaluate the patient and comment on antiplatelet medications in case they need to be held.  3. Smoking. Counseled to quit.  4. Dyslipidemia. Continue statin.  5. GERD. PPI.  6. DM type II. Continue home medication and add sliding scale. Check A1c.   DVT Prophylaxis  SCDs   AM Labs Ordered, also please review Full Orders  Family Communication: Admission, patients condition and plan of care including tests being ordered have been discussed with the patient  who indicates understanding and agree with the plan  and Code Status.  Code Status Full  Likely DC to  Home  Condition GUARDED    Consults called: GI & Cards   Admission status: Inpt    Time spent in minutes : 35   Lala Lund M.D on 07/10/2016 at 4:06 PM  Between 7am to 7pm - Pager - [phone] ( page via [facility], text pages only, please mention full 10 digit call back number).  After 7pm go to [email or web address] - password [facility]  [facility] - Office  [phone]

## 2016-07-10 NOTE — ED Triage Notes (Signed)
Pt sts low hgb per PCP today and sent here today; pt is taking plavix and sometimes has black stools; pt sts generalized weakness

## 2016-07-11 ENCOUNTER — Encounter (HOSPITAL_COMMUNITY): Payer: Self-pay | Admitting: General Practice

## 2016-07-11 ENCOUNTER — Inpatient Hospital Stay (HOSPITAL_COMMUNITY): Payer: BC Managed Care – PPO | Admitting: Certified Registered Nurse Anesthetist

## 2016-07-11 ENCOUNTER — Encounter (HOSPITAL_COMMUNITY)
Admission: EM | Disposition: A | Discharge status: Home / Self Care | Payer: Self-pay | Source: Home / Self Care | Attending: Internal Medicine

## 2016-07-11 ENCOUNTER — Ambulatory Visit (HOSPITAL_COMMUNITY): Payer: BC Managed Care – PPO

## 2016-07-11 DIAGNOSIS — D649 Anemia, unspecified: Secondary | ICD-10-CM

## 2016-07-11 DIAGNOSIS — E118 Type 2 diabetes mellitus with unspecified complications: Secondary | ICD-10-CM

## 2016-07-11 DIAGNOSIS — I10 Essential (primary) hypertension: Secondary | ICD-10-CM

## 2016-07-11 DIAGNOSIS — F17210 Nicotine dependence, cigarettes, uncomplicated: Secondary | ICD-10-CM

## 2016-07-11 DIAGNOSIS — Z9861 Coronary angioplasty status: Secondary | ICD-10-CM

## 2016-07-11 DIAGNOSIS — K922 Gastrointestinal hemorrhage, unspecified: Secondary | ICD-10-CM

## 2016-07-11 DIAGNOSIS — I2510 Atherosclerotic heart disease of native coronary artery without angina pectoris: Secondary | ICD-10-CM

## 2016-07-11 DIAGNOSIS — D62 Acute posthemorrhagic anemia: Secondary | ICD-10-CM

## 2016-07-11 DIAGNOSIS — E1165 Type 2 diabetes mellitus with hyperglycemia: Secondary | ICD-10-CM

## 2016-07-11 HISTORY — PX: ESOPHAGOGASTRODUODENOSCOPY: SHX5428

## 2016-07-11 LAB — PREPARE RBC (CROSSMATCH)

## 2016-07-11 LAB — BASIC METABOLIC PANEL
ANION GAP: 6 (ref 5–15)
BUN: 7 mg/dL (ref 6–20)
CALCIUM: 9.9 mg/dL (ref 8.9–10.3)
CHLORIDE: 102 mmol/L (ref 101–111)
CO2: 30 mmol/L (ref 22–32)
Creatinine, Ser: 0.78 mg/dL (ref 0.44–1.00)
GFR calc Af Amer: >60 mL/min (ref >60)
GFR calc non Af Amer: >60 mL/min (ref >60)
GLUCOSE: 119 mg/dL — AB (ref 65–99)
Potassium: 4 mmol/L (ref 3.5–5.1)
Sodium: 138 mmol/L (ref 135–145)

## 2016-07-11 LAB — HEMOGLOBIN AND HEMATOCRIT, BLOOD
HCT: 24.9 % — ABNORMAL LOW (ref 36.0–46.0)
HEMATOCRIT: 35.2 % — AB (ref 36.0–46.0)
HEMATOCRIT: 37.2 % (ref 36.0–46.0)
HEMOGLOBIN: 11 g/dL — AB (ref 12.0–15.0)
HEMOGLOBIN: 5.2 g/dL — AB (ref 12.0–15.0)
Hemoglobin: 11.6 g/dL — ABNORMAL LOW (ref 12.0–15.0)

## 2016-07-11 LAB — HEMOGLOBIN A1C
Hgb A1c MFr Bld: 6.5 % — ABNORMAL HIGH (ref 4.8–5.6)
MEAN PLASMA GLUCOSE: 140 mg/dL

## 2016-07-11 LAB — CBC
HCT: 39 % (ref 36.0–46.0)
HEMOGLOBIN: 12.2 g/dL (ref 12.0–15.0)
MCH: 24.7 pg — AB (ref 26.0–34.0)
MCHC: 31.3 g/dL (ref 30.0–36.0)
MCV: 79.1 fL (ref 78.0–100.0)
Platelets: 380 10*3/uL (ref 150–400)
RBC: 4.93 MIL/uL (ref 3.87–5.11)
RDW: 18.5 % — ABNORMAL HIGH (ref 11.5–15.5)
WBC: 13.9 10*3/uL — ABNORMAL HIGH (ref 4.0–10.5)

## 2016-07-11 LAB — GLUCOSE, CAPILLARY
GLUCOSE-CAPILLARY: 104 mg/dL — AB (ref 65–99)
GLUCOSE-CAPILLARY: 108 mg/dL — AB (ref 65–99)
Glucose-Capillary: 150 mg/dL — ABNORMAL HIGH (ref 65–99)
Glucose-Capillary: 96 mg/dL (ref 65–99)

## 2016-07-11 LAB — HIV ANTIBODY (ROUTINE TESTING W REFLEX): HIV SCREEN 4TH GENERATION: NONREACTIVE

## 2016-07-11 SURGERY — EGD (ESOPHAGOGASTRODUODENOSCOPY)
Anesthesia: Monitor Anesthesia Care

## 2016-07-11 MED ORDER — SIMETHICONE 80 MG PO CHEW
80.0000 mg | CHEWABLE_TABLET | Freq: Four times a day (QID) | ORAL | Status: DC | PRN
  Administered 2016-07-11: 80 mg via ORAL
  Filled 2016-07-11: qty 1

## 2016-07-11 MED ORDER — WHITE PETROLATUM GEL
Status: AC
  Administered 2016-07-11: 09:00:00
  Filled 2016-07-11: qty 1

## 2016-07-11 MED ORDER — ALUM & MAG HYDROXIDE-SIMETH 200-200-20 MG/5ML PO SUSP
30.0000 mL | ORAL | Status: DC | PRN
  Administered 2016-07-11: 30 mL via ORAL
  Filled 2016-07-11: qty 30

## 2016-07-11 MED ORDER — SODIUM CHLORIDE 0.9 % IV SOLN
80.0000 mg | Freq: Once | INTRAVENOUS | Status: AC
  Administered 2016-07-11: 10:00:00 80 mg via INTRAVENOUS
  Filled 2016-07-11: qty 80

## 2016-07-11 MED ORDER — FUROSEMIDE 10 MG/ML IJ SOLN
20.0000 mg | Freq: Once | INTRAMUSCULAR | Status: AC
  Administered 2016-07-11: 20 mg via INTRAVENOUS
  Filled 2016-07-11: qty 2

## 2016-07-11 MED ORDER — PROPOFOL 10 MG/ML IV BOLUS
INTRAVENOUS | Status: DC | PRN
Start: 2016-07-11 — End: 2016-07-11
  Administered 2016-07-11: 30 mg via INTRAVENOUS
  Administered 2016-07-11: 60 mg via INTRAVENOUS
  Administered 2016-07-11: 40 mg via INTRAVENOUS
  Administered 2016-07-11: 10 mg via INTRAVENOUS
  Administered 2016-07-11: 30 mg via INTRAVENOUS
  Administered 2016-07-11: 40 mg via INTRAVENOUS

## 2016-07-11 MED ORDER — SODIUM CHLORIDE 0.9 % IV SOLN
Freq: Once | INTRAVENOUS | Status: AC
  Administered 2016-07-11: 01:00:00 via INTRAVENOUS

## 2016-07-11 MED ORDER — PANTOPRAZOLE SODIUM 40 MG IV SOLR: 40.0000 mg | Freq: Two times a day (BID) | INTRAVENOUS | Status: DC

## 2016-07-11 MED ORDER — LACTATED RINGERS IV SOLN
INTRAVENOUS | Status: DC
  Administered 2016-07-11: 1000 mL via INTRAVENOUS

## 2016-07-11 MED ORDER — SODIUM CHLORIDE 0.9 % IV SOLN
510.0000 mg | INTRAVENOUS | Status: DC
  Administered 2016-07-11: 510 mg via INTRAVENOUS
  Filled 2016-07-11: qty 17

## 2016-07-11 MED ORDER — SODIUM CHLORIDE 0.9 % IV SOLN
8.0000 mg/h | INTRAVENOUS | Status: DC
  Administered 2016-07-11 – 2016-07-12 (×3): 8 mg/h via INTRAVENOUS
  Filled 2016-07-11 (×5): qty 80

## 2016-07-11 MED ORDER — LIDOCAINE 2% (20 MG/ML) 5 ML SYRINGE
INTRAMUSCULAR | Status: DC | PRN
  Administered 2016-07-11: 40 mg via INTRAVENOUS

## 2016-07-11 NOTE — Consult Note (Signed)
[facility] CONSULT Reason for consult: profound iron deficiency anemia Referring Physician: [facility]. PCP: Dr. Polite. Primary G.I.: Dr. Johnson  Leah Norton is an 61 y.o. female.  HPI: she is been followed by Dr. polite and had some routine lab work with hemoglobin 6 without any gross G.I. bleeding. Patient states for approximately 6 months she has had intermittent dark stools. She denies abdominal pain chronic use of NSAIDs. She is followed by Dr. Smith. She has a history of diabetes in recent cardiac nuclear study suggested ischemia and she underwent catheterization 12/17 and had a stent placed under LAD that was a DES and required antiplatelet agents for a year. She initially was on Brilinta and this was recently changed Plavix. She adamantly denies any NSAID use or any abdominal pain. Her only incident exposure is aspirin 81 mg daily. The patient has no nausea vomiting etc. She had colonoscopy by Dr. Johnson 1/16 this was completely normal other than slight diverticulosis of the sigmoid colon. She has been on a clear liquid diet since admission. She had a clear liquid breakfast about 7:30 AM and had ice chips for about an hour after that. She has been NPO my requests since about 9 AM today.  Past Medical History:  Diagnosis Date  . Anginal pain (HCC)   . Arthritis    "right anikle" (02/29/2016)  . CAD in native artery 02/08/2016   Three-vessel coronary artery calcification on CT scan  . Complication of anesthesia    "had reaction w/hives, rash, itching" (02/29/2016)  . Constipation   . Coronary artery disease    12/17 PCI with DESx1 to pLAD EF normal  . Enlarged heart   . GERD (gastroesophageal reflux disease)   . High cholesterol   . Hypertension   . Migraine    hx  . Symptomatic anemia 07/10/2016  . Type II diabetes mellitus (HCC)     Past Surgical History:  Procedure Laterality Date  . ABDOMINAL HYSTERECTOMY    . CARDIAC CATHETERIZATION N/A  02/29/2016   Procedure: Left Heart Cath and Coronary Angiography;  Surgeon: Henry W Smith, MD;  Location: [facility];  Service: Cardiovascular;  Laterality: N/A;  . CARDIAC CATHETERIZATION N/A 02/29/2016   Procedure: Coronary Stent Intervention;  Surgeon: Henry W Smith, MD;  Location: [facility];  Service: Cardiovascular;  Laterality: N/A;  . CARDIAC CATHETERIZATION  2005   "no blockages"  . COLONOSCOPY WITH PROPOFOL N/A 03/15/2014   Procedure: COLONOSCOPY WITH PROPOFOL;  Surgeon: Martin K Johnson, MD;  Location: [facility];  Service: Endoscopy;  Laterality: N/A;  . FRACTURE SURGERY    . MULTIPLE TOOTH EXTRACTIONS     teeth removed  . ORIF ANKLE FRACTURE Right 11/12/2012   Procedure: OPEN REDUCTION INTERNAL FIXATION (ORIF) ANKLE FRACTURE;  Surgeon: Naiping Michael Xu, MD;  Location: [facility] OR;  Service: Orthopedics;  Laterality: Right;  Open reduction internal fixation ankle fracture, trimalleolar    Family History  Problem Relation Age of Onset  . Hypertension Mother     Social History:  reports that she has been smoking Cigarettes.  She has a 20.00 pack-year smoking history. She has never used smokeless tobacco. She reports that she drinks alcohol. She reports that she does not use drugs.  Allergies:  Allergies  Allergen Reactions  . Codeine Rash  . Erythromycin Rash  . Penicillins Rash    Has patient had a PCN reaction causing immediate rash, facial/tongue/throat swelling, SOB or lightheadedness with hypotension: Yes Has patient had   a PCN reaction causing severe rash involving mucus membranes or skin necrosis: No Has patient had a PCN reaction that required hospitalization: No Has patient had a PCN reaction occurring within the last 10 years: Yes If all of the above answers are "NO", then may proceed with Cephalosporin use.   . Sulfonamide Derivatives Rash  . Vicodin [Hydrocodone-Acetaminophen] Rash    Medications; Prior to Admission medications   Medication Sig  Start Date End Date Taking? Authorizing Provider  albuterol (PROVENTIL HFA;VENTOLIN HFA) 108 (90 BASE) MCG/ACT inhaler Inhale 1-2 puffs into the lungs every 6 (six) hours as needed for wheezing. 12/08/11  Yes Leslie K Sofia, PA-C  aspirin EC 81 MG tablet Take 1 tablet (81 mg total) by mouth daily. 04/18/16  Yes Christopher D McAlhany, MD  atenolol (TENORMIN) 50 MG tablet Take 50 mg by mouth daily.    Yes Historical Provider, MD  buPROPion (WELLBUTRIN SR) 150 MG 12 hr tablet Take 150 mg by mouth daily.    Yes Historical Provider, MD  Cholecalciferol (VITAMIN D) 2000 units CAPS Take 2,000 Units by mouth daily.   Yes Historical Provider, MD  clopidogrel (PLAVIX) 75 MG tablet Take 75 mg by mouth daily.   Yes Historical Provider, MD  Cod Liver Oil CAPS Take 1 capsule by mouth See admin instructions. One to two times a day   Yes Historical Provider, MD  hydrochlorothiazide (HYDRODIURIL) 25 MG tablet Take 25 mg by mouth every morning.    Yes Historical Provider, MD  losartan (COZAAR) 100 MG tablet Take 1 tablet (100 mg total) by mouth every morning. 03/01/16  Yes Lindsay B Roberts, NP  nitroGLYCERIN (NITROSTAT) 0.4 MG SL tablet Place 0.4 mg under the tongue every 5 (five) minutes as needed for chest pain (Call 911 at 3rd dose within 15 minutes.).   Yes Historical Provider, MD  pantoprazole (PROTONIX) 40 MG tablet Take 1 tablet (40 mg total) by mouth daily. 03/09/16  Yes Laura R Ingold, NP  sitaGLIPtin-metformin (JANUMET) 50-1000 MG per tablet Take 1 tablet by mouth 2 (two) times daily with a meal.   Yes Historical Provider, MD  atorvastatin (LIPITOR) 80 MG tablet Take 1 tablet (80 mg total) by mouth daily at 6 PM. Patient not taking: Reported on 07/10/2016 03/01/16   Lindsay B Roberts, NP   . atenolol  50 mg Oral Daily  . atorvastatin  80 mg Oral q1800  . buPROPion  150 mg Oral Daily  . cholecalciferol  2,000 Units Oral Daily  . insulin aspart  0-5 Units Subcutaneous QHS  . insulin aspart  0-9 Units  Subcutaneous TID WC  . [START ON 07/14/2016] pantoprazole  40 mg Intravenous Q12H   PRN Meds albuterol, alum & mag hydroxide-simeth, nitroGLYCERIN, ondansetron **OR** ondansetron (ZOFRAN) IV Results for orders placed or performed during the hospital encounter of 07/10/16 (from the past 48 hour(s))  Comprehensive metabolic panel     Status: Abnormal   Collection Time: 07/10/16  1:34 PM  Result Value Ref Range   Sodium 133 (L) 135 - 145 mmol/L   Potassium 3.4 (L) 3.5 - 5.1 mmol/L   Chloride 100 (L) 101 - 111 mmol/L   CO2 26 22 - 32 mmol/L   Glucose, Bld 222 (H) 65 - 99 mg/dL   BUN 9 6 - 20 mg/dL   Creatinine, Ser 0.79 0.44 - 1.00 mg/dL   Calcium 9.2 8.9 - 10.3 mg/dL   Total Protein 7.2 6.5 - 8.1 g/dL   Albumin 3.4 (L) 3.5 - 5.0   g/dL   AST 23 15 - 41 U/L   ALT 14 14 - 54 U/L   Alkaline Phosphatase 81 38 - 126 U/L   Total Bilirubin 0.4 0.3 - 1.2 mg/dL   GFR calc non Af Amer >60 >60 mL/min   GFR calc Af Amer >60 >60 mL/min    Comment: (NOTE) The eGFR has been calculated using the CKD EPI equation. This calculation has not been validated in all clinical situations. eGFR's persistently <60 mL/min signify possible Chronic Kidney Disease.    Anion gap 7 5 - 15  CBC     Status: Abnormal   Collection Time: 07/10/16  1:34 PM  Result Value Ref Range   WBC 10.0 4.0 - 10.5 K/uL   RBC 3.01 (L) 3.87 - 5.11 MIL/uL   Hemoglobin 6.0 (LL) 12.0 - 15.0 g/dL    Comment: REPEATED TO VERIFY CRITICAL RESULT CALLED TO, READ BACK BY AND VERIFIED WITH: A. BORST,RN [id number] BY S. YARBROUGH    HCT 22.2 (L) 36.0 - 46.0 %   MCV 73.8 (L) 78.0 - 100.0 fL   MCH 19.9 (L) 26.0 - 34.0 pg   MCHC 27.0 (L) 30.0 - 36.0 g/dL   RDW 19.2 (H) 11.5 - 15.5 %   Platelets 408 (H) 150 - 400 K/uL  Type and screen [place]     Status: None (Preliminary result)   Collection Time: 07/10/16  1:45 PM  Result Value Ref Range   ABO/RH(D) B POS    Antibody Screen NEG    Sample Expiration 07/13/2016     Unit Number [id number]    Blood Component Type RED CELLS,LR    Unit division 00    Status of Unit ISSUED,FINAL    Transfusion Status OK TO TRANSFUSE    Crossmatch Result Compatible    Unit Number [id number]    Blood Component Type RED CELLS,LR    Unit division 00    Status of Unit ISSUED,FINAL    Transfusion Status OK TO TRANSFUSE    Crossmatch Result Compatible    Unit Number [id number]    Blood Component Type RED CELLS,LR    Unit division 00    Status of Unit ISSUED    Transfusion Status OK TO TRANSFUSE    Crossmatch Result Compatible    Unit Number [id number]    Blood Component Type RED CELLS,LR    Unit division 00    Status of Unit ISSUED    Transfusion Status OK TO TRANSFUSE    Crossmatch Result Compatible    Unit Number [id number]    Blood Component Type RED CELLS,LR    Unit division 00    Status of Unit ISSUED    Transfusion Status OK TO TRANSFUSE    Crossmatch Result Compatible   ABO/Rh     Status: None   Collection Time: 07/10/16  1:45 PM  Result Value Ref Range   ABO/RH(D) B POS   Vitamin B12     Status: None   Collection Time: 07/10/16  2:02 PM  Result Value Ref Range   Vitamin B-12 541 180 - 914 pg/mL    Comment: (NOTE) This assay is not validated for testing neonatal or myeloproliferative syndrome specimens for Vitamin B12 levels.   Folate     Status: None   Collection Time: 07/10/16  2:02 PM  Result Value Ref Range   Folate 33.4 >5.9 ng/mL  Iron and TIBC     Status: Abnormal   Collection Time: 07/10/16  2:02  PM  Result Value Ref Range   Iron 13 (L) 28 - 170 ug/dL   TIBC 525 (H) 250 - 450 ug/dL   Saturation Ratios 2 (L) 10.4 - 31.8 %   UIBC 512 ug/dL  Ferritin     Status: Abnormal   Collection Time: 07/10/16  2:02 PM  Result Value Ref Range   Ferritin 4 (L) 11 - 307 ng/mL  Reticulocytes     Status: Abnormal   Collection Time: 07/10/16  2:02 PM  Result Value Ref Range   Retic Ct Pct 3.0 0.4 - 3.1 %   RBC. 3.08 (L) 3.87 - 5.11  MIL/uL   Retic Count, Manual 92.4 19.0 - 186.0 K/uL  Protime-INR     Status: None   Collection Time: 07/10/16  2:02 PM  Result Value Ref Range   Prothrombin Time 13.5 11.4 - 15.2 seconds   INR 1.02   HIV antibody (Routine Testing)     Status: None   Collection Time: 07/10/16  2:02 PM  Result Value Ref Range   HIV Screen 4th Generation wRfx Non Reactive Non Reactive    Comment: (NOTE) Performed At: [facility] [place], [place] [id number] Lindon Romp MD [id number]   Hemoglobin A1c     Status: Abnormal   Collection Time: 07/10/16  2:02 PM  Result Value Ref Range   Hgb A1c MFr Bld 6.5 (H) 4.8 - 5.6 %    Comment: (NOTE)         Pre-diabetes: 5.7 - 6.4         Diabetes: >6.4         Glycemic control for adults with diabetes: <7.0    Mean Plasma Glucose 140 mg/dL    Comment: (NOTE) Performed At: [facility] [place], [place] [id number] Lindon Romp MD [id number]   Prepare RBC     Status: None   Collection Time: 07/10/16  2:33 PM  Result Value Ref Range   Order Confirmation ORDER PROCESSED BY BLOOD BANK   POC occult blood, ED RN will collect     Status: None   Collection Time: 07/10/16  3:02 PM  Result Value Ref Range   Fecal Occult Bld NEGATIVE NEGATIVE  Glucose, capillary     Status: None   Collection Time: 07/10/16  6:01 PM  Result Value Ref Range   Glucose-Capillary 79 65 - 99 mg/dL   Comment 1 Notify RN   Glucose, capillary     Status: Abnormal   Collection Time: 07/10/16  9:43 PM  Result Value Ref Range   Glucose-Capillary 142 (H) 65 - 99 mg/dL  Hemoglobin and hematocrit, blood     Status: Abnormal   Collection Time: 07/10/16 11:52 PM  Result Value Ref Range   Hemoglobin 5.2 (LL) 12.0 - 15.0 g/dL    Comment: REPEATED TO VERIFY CRITICAL VALUE NOTED.  VALUE IS CONSISTENT WITH PREVIOUSLY REPORTED AND CALLED VALUE.    HCT 24.9 (L) 36.0 - 46.0 %  Prepare RBC     Status: None   Collection Time: 07/11/16  12:40 AM  Result Value Ref Range   Order Confirmation ORDER PROCESSED BY BLOOD BANK   Glucose, capillary     Status: Abnormal   Collection Time: 07/11/16  8:05 AM  Result Value Ref Range   Glucose-Capillary 104 (H) 65 - 99 mg/dL   Comment 1 Notify RN     No results found.  Blood pressure (!) 145/75, pulse 73, temperature 98.4 F (36.9 C), temperature source Oral, resp. rate 18, SpO2 95 %.  Physical exam:   General--Pleasant African-American female no distress ENT-- mucous membranes moist somewhat pale Neck-- supplemental lymphadenopathy Heart-- regular rate and rhythm without murmurs are gallops Lungs-- clear Abdomen-- soft and completely nontender Psych-- alert and oriented, answers questions appropriately   Assessment: 1. Profound iron deficiency anemia. The patient's tools for negative but she gives a history of having melenic stools intermittently for 6 or 8 months. She has recently been on antiplatelet agents which have possibly contributed to this. She had a hysterectomy about 10 years ago. She's not seen any bright blood and did have the negative colonoscopy a couple of years ago. I think we need to go ahead with EGD is an initial step 2. CAD with recent placement of DES in LAD. She will need to be on antiplatelet agents for some time.  Plan: 1. We will keep her NPO. We have her schedule for EGD later today. Will obtain small bowel biopsies to evaluate for celiac disease.   Ysela Hettinger JR,Cono Gebhard L 07/11/2016, 10:47 AM   This note was created using voice recognition software and minor errors may Have occurred unintentionally. Pager: [phone] If no answer or after hours call [phone]

## 2016-07-11 NOTE — Op Note (Signed)
[facility] Patient Name: Leah Norton Procedure Date : 07/11/2016 MRN: [id number] Attending MD: Nancy Fetter Dr., MD Date of Birth: 08-23-1955 CSN: [id number] Age: 61 Admit Type: Inpatient Procedure:                Upper GI endoscopy Indications:              Iron deficiency anemia secondary to chronic blood                            loss, Iron deficiency anemia Providers:                Joyice Faster. Spenser Cong Dr., MD, Cleda Daub, RN, Tinnie Gens, Technician, Corliss Parish, Technician Referring MD:              Medicines:                Monitored Anesthesia Care Complications:            No immediate complications. Estimated Blood Loss:     Estimated blood loss: none. Procedure:                Pre-Anesthesia Assessment:                           - Prior to the procedure, a History and Physical                            was performed, and patient medications and                            allergies were reviewed. The patient's tolerance of                            previous anesthesia was also reviewed. The risks                            and benefits of the procedure and the sedation                            options and risks were discussed with the patient.                            All questions were answered, and informed consent                            was obtained. Prior Anticoagulants: The patient has                            taken no previous anticoagulant or antiplatelet                            agents. ASA Grade Assessment: II - A patient with  mild systemic disease. After reviewing the risks                            and benefits, the patient was deemed in                            satisfactory condition to undergo the procedure.                           After obtaining informed consent, the endoscope was                            passed under direct vision. Throughout the                 procedure, the patient's blood pressure, pulse, and                            oxygen saturations were monitored continuously. The                            EG-[id number] ([id number]) scope was introduced through the                            mouth, and advanced to the second part of duodenum.                            The upper GI endoscopy was accomplished without                            difficulty. The patient tolerated the procedure                            well. Scope In: Scope Out: Findings:      A small hiatal hernia was present.      There is no endoscopic evidence of bleeding, esophagitis or varices in       the entire esophagus.      One mild benign-appearing, intrinsic stenosis was found at the       gastroesophageal junction. And was traversed.      The stomach was normal.      The examined duodenum was normal. Biopsies for histology were taken with       a cold forceps for evaluation of celiac disease. Impression:               - Small hiatal hernia.                           - Benign-appearing esophageal stenosis.                           - Normal stomach.                           - Normal examined duodenum. Biopsied.                           -  Iron Deficiency Anemia. no source of G.I.                            bleeding seen on EGD. Duodenum biopsy to evaluate                            for celiac disease Moderate Sedation:      MAC by anesthesia Recommendation:           - Return patient to hospital ward for ongoing care.                           - Full liquid diet.                           - Continue present medications. Procedure Code(s):        --- Professional ---                           [phone], Esophagogastroduodenoscopy, flexible,                            transoral; with biopsy, single or multiple Diagnosis Code(s):        --- Professional ---                           D50.9, Iron deficiency anemia, unspecified                            K22.2, Esophageal obstruction                           K44.9, Diaphragmatic hernia without obstruction or                            gangrene CPT copyright 2016 [employer]. All rights reserved. The codes documented in this report are preliminary and upon coder review may  be revised to meet current compliance requirements. Nancy Fetter Dr., MD 07/11/2016 3:11:40 PM This report has been signed electronically. Number of Addenda: 0

## 2016-07-11 NOTE — Progress Notes (Signed)
Patients post transfusion Hgb was noted to be 5.2. A drop from 6 (pre transfusion). On call NP notified and 3 units ordered. 1st unit is currently transfusing. Will continue to monitor. Jimmie Molly, RN

## 2016-07-11 NOTE — H&P (View-Only) (Signed)
[facility] CONSULT Reason for consult: profound iron deficiency anemia Referring Physician: [facility]. PCP: Dr. Delfina Redwood. Primary G.I.: Dr. Leodis Liverpool Leah Norton is an 61 y.o. female.  HPI: she is been followed by Dr. polite and had some routine lab work with hemoglobin 6 without any gross G.I. bleeding. Patient states for approximately 6 months she has had intermittent dark stools. She denies abdominal pain chronic use of NSAIDs. She is followed by Dr. Tamala Julian. She has a history of diabetes in recent cardiac nuclear study suggested ischemia and she underwent catheterization 12/17 and had a stent placed under LAD that was a DES and required antiplatelet agents for a year. She initially was on Brilinta and this was recently changed Plavix. She adamantly denies any NSAID use or any abdominal pain. Her only incident exposure is aspirin 81 mg daily. The patient has no nausea vomiting etc. She had colonoscopy by Dr. Wynetta Emery 1/16 this was completely normal other than slight diverticulosis of the sigmoid colon. She has been on a clear liquid diet since admission. She had a clear liquid breakfast about 7:30 AM and had ice chips for about an hour after that. She has been NPO my requests since about 9 AM today.  Past Medical History:  Diagnosis Date  . Anginal pain ([place])   . Arthritis    "right anikle" (02/29/2016)  . CAD in native artery 02/08/2016   Three-vessel coronary artery calcification on CT scan  . Complication of anesthesia    "had reaction w/hives, rash, itching" (02/29/2016)  . Constipation   . Coronary artery disease    12/17 PCI with DESx1 to pLAD EF normal  . Enlarged heart   . GERD (gastroesophageal reflux disease)   . High cholesterol   . Hypertension   . Migraine    hx  . Symptomatic anemia 07/10/2016  . Type II diabetes mellitus ([place])     Past Surgical History:  Procedure Laterality Date  . ABDOMINAL HYSTERECTOMY    . CARDIAC CATHETERIZATION N/A  02/29/2016   Procedure: Left Heart Cath and Coronary Angiography;  Surgeon: Belva Crome, MD;  Location: [place] [facility];  Service: Cardiovascular;  Laterality: N/A;  . CARDIAC CATHETERIZATION N/A 02/29/2016   Procedure: Coronary Stent Intervention;  Surgeon: Belva Crome, MD;  Location: [place] [facility];  Service: Cardiovascular;  Laterality: N/A;  . CARDIAC CATHETERIZATION  2005   "no blockages"  . COLONOSCOPY WITH PROPOFOL N/A 03/15/2014   Procedure: COLONOSCOPY WITH PROPOFOL;  Surgeon: Garlan Fair, MD;  Location: [facility];  Service: Endoscopy;  Laterality: N/A;  . FRACTURE SURGERY    . MULTIPLE TOOTH EXTRACTIONS     teeth removed  . ORIF ANKLE FRACTURE Right 11/12/2012   Procedure: OPEN REDUCTION INTERNAL FIXATION (ORIF) ANKLE FRACTURE;  Surgeon: Marianna Payment, MD;  Location: [place];  Service: Orthopedics;  Laterality: Right;  Open reduction internal fixation ankle fracture, trimalleolar    Family History  Problem Relation Age of Onset  . Hypertension Mother     Social History:  reports that she has been smoking Cigarettes.  She has a 20.00 pack-year smoking history. She has never used smokeless tobacco. She reports that she drinks alcohol. She reports that she does not use drugs.  Allergies:  Allergies  Allergen Reactions  . Codeine Rash  . Erythromycin Rash  . Penicillins Rash    Has patient had a PCN reaction causing immediate rash, facial/tongue/throat swelling, SOB or lightheadedness with hypotension: Yes Has patient had  a PCN reaction causing severe rash involving mucus membranes or skin necrosis: No Has patient had a PCN reaction that required hospitalization: No Has patient had a PCN reaction occurring within the last 10 years: Yes If all of the above answers are "NO", then may proceed with Cephalosporin use.   . Sulfonamide Derivatives Rash  . Vicodin [Hydrocodone-Acetaminophen] Rash    Medications; Prior to Admission medications   Medication Sig  Start Date End Date Taking? Authorizing Provider  albuterol (PROVENTIL HFA;VENTOLIN HFA) 108 (90 BASE) MCG/ACT inhaler Inhale 1-2 puffs into the lungs every 6 (six) hours as needed for wheezing. 12/08/11  Yes Fransico Meadow, PA-C  aspirin EC 81 MG tablet Take 1 tablet (81 mg total) by mouth daily. 04/18/16  Yes Burnell Blanks, MD  atenolol (TENORMIN) 50 MG tablet Take 50 mg by mouth daily.    Yes Historical Provider, MD  buPROPion (WELLBUTRIN SR) 150 MG 12 hr tablet Take 150 mg by mouth daily.    Yes Historical Provider, MD  Cholecalciferol (VITAMIN D) 2000 units CAPS Take 2,000 Units by mouth daily.   Yes Historical Provider, MD  clopidogrel (PLAVIX) 75 MG tablet Take 75 mg by mouth daily.   Yes Historical Provider, MD  [facility] Liver Oil CAPS Take 1 capsule by mouth See admin instructions. One to two times a day   Yes Historical Provider, MD  hydrochlorothiazide (HYDRODIURIL) 25 MG tablet Take 25 mg by mouth every morning.    Yes Historical Provider, MD  losartan (COZAAR) 100 MG tablet Take 1 tablet (100 mg total) by mouth every morning. 03/01/16  Yes Cheryln Manly, NP  nitroGLYCERIN (NITROSTAT) 0.4 MG SL tablet Place 0.4 mg under the tongue every 5 (five) minutes as needed for chest pain (Call 911 at 3rd dose within 15 minutes.).   Yes Historical Provider, MD  pantoprazole (PROTONIX) 40 MG tablet Take 1 tablet (40 mg total) by mouth daily. 03/09/16  Yes Isaiah Serge, NP  sitaGLIPtin-metformin (JANUMET) 50-1000 MG per tablet Take 1 tablet by mouth 2 (two) times daily with a meal.   Yes Historical Provider, MD  atorvastatin (LIPITOR) 80 MG tablet Take 1 tablet (80 mg total) by mouth daily at 6 PM. Patient not taking: Reported on 07/10/2016 03/01/16   Cheryln Manly, NP   . atenolol  50 mg Oral Daily  . atorvastatin  80 mg Oral q1800  . buPROPion  150 mg Oral Daily  . cholecalciferol  2,000 Units Oral Daily  . insulin aspart  0-5 Units Subcutaneous QHS  . insulin aspart  0-9 Units  Subcutaneous TID WC  . [START ON 07/14/2016] pantoprazole  40 mg Intravenous Q12H   PRN Meds albuterol, alum & mag hydroxide-simeth, nitroGLYCERIN, ondansetron **OR** ondansetron (ZOFRAN) IV Results for orders placed or performed during the hospital encounter of 07/10/16 (from the past 48 hour(s))  Comprehensive metabolic panel     Status: Abnormal   Collection Time: 07/10/16  1:34 PM  Result Value Ref Range   Sodium 133 (L) 135 - 145 mmol/L   Potassium 3.4 (L) 3.5 - 5.1 mmol/L   Chloride 100 (L) 101 - 111 mmol/L   CO2 26 22 - 32 mmol/L   Glucose, Bld 222 (H) 65 - 99 mg/dL   BUN 9 6 - 20 mg/dL   Creatinine, Ser 0.79 0.44 - 1.00 mg/dL   Calcium 9.2 8.9 - 10.3 mg/dL   Total Protein 7.2 6.5 - 8.1 g/dL   Albumin 3.4 (L) 3.5 - 5.0  g/dL   AST 23 15 - 41 U/L   ALT 14 14 - 54 U/L   Alkaline Phosphatase 81 38 - 126 U/L   Total Bilirubin 0.4 0.3 - 1.2 mg/dL   GFR calc non Af Amer >60 >60 mL/min   GFR calc Af Amer >60 >60 mL/min    Comment: (NOTE) The eGFR has been calculated using the CKD EPI equation. This calculation has not been validated in all clinical situations. eGFR's persistently <60 mL/min signify possible Chronic Kidney Disease.    Anion gap 7 5 - 15  CBC     Status: Abnormal   Collection Time: 07/10/16  1:34 PM  Result Value Ref Range   WBC 10.0 4.0 - 10.5 K/uL   RBC 3.01 (L) 3.87 - 5.11 MIL/uL   Hemoglobin 6.0 (LL) 12.0 - 15.0 g/dL    Comment: REPEATED TO VERIFY CRITICAL RESULT CALLED TO, READ BACK BY AND VERIFIED WITH: A. BORST,RN [id number] BY S. YARBROUGH    HCT 22.2 (L) 36.0 - 46.0 %   MCV 73.8 (L) 78.0 - 100.0 fL   MCH 19.9 (L) 26.0 - 34.0 pg   MCHC 27.0 (L) 30.0 - 36.0 g/dL   RDW 19.2 (H) 11.5 - 15.5 %   Platelets 408 (H) 150 - 400 K/uL  Type and screen [place]     Status: None (Preliminary result)   Collection Time: 07/10/16  1:45 PM  Result Value Ref Range   ABO/RH(D) B POS    Antibody Screen NEG    Sample Expiration 07/13/2016     Unit Number [id number]    Blood Component Type RED CELLS,LR    Unit division 00    Status of Unit ISSUED,FINAL    Transfusion Status OK TO TRANSFUSE    Crossmatch Result Compatible    Unit Number [id number]    Blood Component Type RED CELLS,LR    Unit division 00    Status of Unit ISSUED,FINAL    Transfusion Status OK TO TRANSFUSE    Crossmatch Result Compatible    Unit Number [id number]    Blood Component Type RED CELLS,LR    Unit division 00    Status of Unit ISSUED    Transfusion Status OK TO TRANSFUSE    Crossmatch Result Compatible    Unit Number [id number]    Blood Component Type RED CELLS,LR    Unit division 00    Status of Unit ISSUED    Transfusion Status OK TO TRANSFUSE    Crossmatch Result Compatible    Unit Number [id number]    Blood Component Type RED CELLS,LR    Unit division 00    Status of Unit ISSUED    Transfusion Status OK TO TRANSFUSE    Crossmatch Result Compatible   ABO/Rh     Status: None   Collection Time: 07/10/16  1:45 PM  Result Value Ref Range   ABO/RH(D) B POS   Vitamin B12     Status: None   Collection Time: 07/10/16  2:02 PM  Result Value Ref Range   Vitamin B-12 541 180 - 914 pg/mL    Comment: (NOTE) This assay is not validated for testing neonatal or myeloproliferative syndrome specimens for Vitamin B12 levels.   Folate     Status: None   Collection Time: 07/10/16  2:02 PM  Result Value Ref Range   Folate 33.4 >5.9 ng/mL  Iron and TIBC     Status: Abnormal   Collection Time: 07/10/16  2:02  PM  Result Value Ref Range   Iron 13 (L) 28 - 170 ug/dL   TIBC 525 (H) 250 - 450 ug/dL   Saturation Ratios 2 (L) 10.4 - 31.8 %   UIBC 512 ug/dL  Ferritin     Status: Abnormal   Collection Time: 07/10/16  2:02 PM  Result Value Ref Range   Ferritin 4 (L) 11 - 307 ng/mL  Reticulocytes     Status: Abnormal   Collection Time: 07/10/16  2:02 PM  Result Value Ref Range   Retic Ct Pct 3.0 0.4 - 3.1 %   RBC. 3.08 (L) 3.87 - 5.11  MIL/uL   Retic Count, Manual 92.4 19.0 - 186.0 K/uL  Protime-INR     Status: None   Collection Time: 07/10/16  2:02 PM  Result Value Ref Range   Prothrombin Time 13.5 11.4 - 15.2 seconds   INR 1.02   HIV antibody (Routine Testing)     Status: None   Collection Time: 07/10/16  2:02 PM  Result Value Ref Range   HIV Screen 4th Generation wRfx Non Reactive Non Reactive    Comment: (NOTE) Performed At: [facility] [place], [place] [id number] Lindon Romp MD [id number]   Hemoglobin A1c     Status: Abnormal   Collection Time: 07/10/16  2:02 PM  Result Value Ref Range   Hgb A1c MFr Bld 6.5 (H) 4.8 - 5.6 %    Comment: (NOTE)         Pre-diabetes: 5.7 - 6.4         Diabetes: >6.4         Glycemic control for adults with diabetes: <7.0    Mean Plasma Glucose 140 mg/dL    Comment: (NOTE) Performed At: [facility] [place], [place] [id number] Lindon Romp MD [id number]   Prepare RBC     Status: None   Collection Time: 07/10/16  2:33 PM  Result Value Ref Range   Order Confirmation ORDER PROCESSED BY BLOOD BANK   POC occult blood, ED RN will collect     Status: None   Collection Time: 07/10/16  3:02 PM  Result Value Ref Range   Fecal Occult Bld NEGATIVE NEGATIVE  Glucose, capillary     Status: None   Collection Time: 07/10/16  6:01 PM  Result Value Ref Range   Glucose-Capillary 79 65 - 99 mg/dL   Comment 1 Notify RN   Glucose, capillary     Status: Abnormal   Collection Time: 07/10/16  9:43 PM  Result Value Ref Range   Glucose-Capillary 142 (H) 65 - 99 mg/dL  Hemoglobin and hematocrit, blood     Status: Abnormal   Collection Time: 07/10/16 11:52 PM  Result Value Ref Range   Hemoglobin 5.2 (LL) 12.0 - 15.0 g/dL    Comment: REPEATED TO VERIFY CRITICAL VALUE NOTED.  VALUE IS CONSISTENT WITH PREVIOUSLY REPORTED AND CALLED VALUE.    HCT 24.9 (L) 36.0 - 46.0 %  Prepare RBC     Status: None   Collection Time: 07/11/16  12:40 AM  Result Value Ref Range   Order Confirmation ORDER PROCESSED BY BLOOD BANK   Glucose, capillary     Status: Abnormal   Collection Time: 07/11/16  8:05 AM  Result Value Ref Range   Glucose-Capillary 104 (H) 65 - 99 mg/dL   Comment 1 Notify RN     No results found.  Blood pressure (!) 145/75, pulse 73, temperature 98.4 F (36.9 C), temperature source Oral, resp. rate 18, SpO2 95 %.  Physical exam:   General--Pleasant African-American female no distress ENT-- mucous membranes moist somewhat pale Neck-- supplemental lymphadenopathy Heart-- regular rate and rhythm without murmurs are gallops Lungs-- clear Abdomen-- soft and completely nontender Psych-- alert and oriented, answers questions appropriately   Assessment: 1. Profound iron deficiency anemia. The patient's tools for negative but she gives a history of having melenic stools intermittently for 6 or 8 months. She has recently been on antiplatelet agents which have possibly contributed to this. She had a hysterectomy about 10 years ago. She's not seen any bright blood and did have the negative colonoscopy a couple of years ago. I think we need to go ahead with EGD is an initial step 2. CAD with recent placement of DES in LAD. She will need to be on antiplatelet agents for some time.  Plan: 1. We will keep her NPO. We have her schedule for EGD later today. Will obtain small bowel biopsies to evaluate for celiac disease.   Nare Gaspari JR,Gianah Batt L 07/11/2016, 10:47 AM   This note was created using voice recognition software and minor errors may Have occurred unintentionally. Pager: [phone] If no answer or after hours call [phone]

## 2016-07-11 NOTE — Anesthesia Postprocedure Evaluation (Addendum)
Anesthesia Post Note  Patient: Leah Norton  Procedure(s) Performed: Procedure(s) (LRB): ESOPHAGOGASTRODUODENOSCOPY (EGD) (N/A)  Patient location during evaluation: Endoscopy Anesthesia Type: MAC Level of consciousness: awake and alert Pain management: pain level controlled Vital Signs Assessment: post-procedure vital signs reviewed and stable Respiratory status: spontaneous breathing, nonlabored ventilation, respiratory function stable and patient connected to nasal cannula oxygen Cardiovascular status: stable and blood pressure returned to baseline Anesthetic complications: no       Last Vitals:  Vitals:   07/11/16 1515 07/11/16 1520  BP: (!) 155/77 (!) 156/75  Pulse: 70 66  Resp: 17 18  Temp: 36.7 C     Last Pain:  Vitals:   07/11/16 1515  TempSrc: Oral                 [name]

## 2016-07-11 NOTE — Anesthesia Procedure Notes (Signed)
Procedure Name: MAC Date/Time: 07/11/2016 2:45 PM Performed by: Candis Shine Pre-anesthesia Checklist: Patient identified, Emergency Drugs available, Suction available, Patient being monitored and Timeout performed Patient Re-evaluated:Patient Re-evaluated prior to inductionOxygen Delivery Method: Nasal cannula Dental Injury: Teeth and Oropharynx as per pre-operative assessment

## 2016-07-11 NOTE — Interval H&P Note (Signed)
History and Physical Interval Note:  07/11/2016 1:58 PM  Leah Norton  has presented today for surgery, with the diagnosis of gi bleed  The various methods of treatment have been discussed with the patient and family. After consideration of risks, benefits and other options for treatment, the patient has consented to  Procedure(s): ESOPHAGOGASTRODUODENOSCOPY (EGD) (N/A) as a surgical intervention .  The patient's history has been reviewed, patient examined, no change in status, stable for surgery.  I have reviewed the patient's chart and labs.  Questions were answered to the patient's satisfaction.     Randee Upchurch JR,Andrae Claunch L

## 2016-07-11 NOTE — Anesthesia Preprocedure Evaluation (Signed)
Anesthesia Evaluation  Patient identified by MRN, date of birth, ID band Patient awake    Reviewed: Allergy & Precautions, H&P , NPO status , Patient's Chart, lab work & pertinent test results  History of Anesthesia Complications Negative for: history of anesthetic complications  Airway Mallampati: II  TM Distance: >3 FB Neck ROM: Full    Dental  (+) Missing, Poor Dentition   Pulmonary Current Smoker,    Pulmonary exam normal breath sounds clear to auscultation       Cardiovascular hypertension, Pt. on medications and Pt. on home beta blockers (-) angina+ CAD and + Cardiac Stents  Normal cardiovascular exam+ Valvular Problems/Murmurs  Rhythm:Regular Rate:Normal     Neuro/Psych  Headaches, PSYCHIATRIC DISORDERS    GI/Hepatic Neg liver ROS, GERD  Medicated,  Endo/Other  diabetes, Type 2  Renal/GU negative Renal ROS  negative genitourinary   Musculoskeletal  (+) Arthritis ,   Abdominal   Peds negative pediatric ROS (+)  Hematology  (+) anemia ,   Anesthesia Other Findings   Reproductive/Obstetrics negative OB ROS                             Anesthesia Physical Anesthesia Plan  ASA: III  Anesthesia Plan: MAC   Post-op Pain Management:    Induction: Intravenous  Airway Management Planned: Nasal Cannula, Natural Airway and Simple Face Mask  Additional Equipment: None  Intra-op Plan:   Post-operative Plan:   Informed Consent: I have reviewed the patients History and Physical, chart, labs and discussed the procedure including the risks, benefits and alternatives for the proposed anesthesia with the patient or authorized representative who has indicated his/her understanding and acceptance.   Dental advisory given  Plan Discussed with: CRNA and Surgeon  Anesthesia Plan Comments:         Anesthesia Quick Evaluation

## 2016-07-11 NOTE — Progress Notes (Signed)
Progress Note    Leah Norton  PHX:[id number] DOB: November 19, 1955  DOA: 07/10/2016 PCP: Kandice Hams, MD    Brief Narrative:   Chief complaint: F/U dark stools  Medical records reviewed and are as summarized below:  Leah Norton is an 61 y.o. female with a PMH of CAD status post DES placement 12/17 on chronic aspirin/Plavix, GERD, hypertension, dyslipidemia, type 2 diabetes, and ongoing tobacco abuse who was admitted 07/10/16 with a chief complaint of dark stools associated with fatigue for which she originally went to her PCP for and was found to have anemia and therefore was referred to the ED. Hemoglobin was found to be 6.0 on presentation.  Assessment/Plan:   Principal Problem:   GI bleed with acute blood loss anemia Aspirin/Plavix placed on hold with cardiology input. Evaluated by GI with plans to proceed with EGD today. Received 1 unit of PRBCs in the ED and 3 units of PRBCs overnight. Post transfusion hemoglobin was 12.2.  Active Problems:   DM (diabetes mellitus), type 2, uncontrolled with complications ([place]) Currently being managed with insulin sensitive SSI Q AC/HS. CBGs 79-142.    Cigarette smoker Tobacco cessation counseling per nursing.    Essential hypertension Currently on atenolol. Systolic pressures [id number].    CAD S/P percutaneous coronary angioplasty Difficult situation and dual antiplatelet therapy remains recommended to prevent restenosis. Unfortunately, with her significant GI bleeding, we have had to hold these agents. Patient counseled to notify us immediately if she should develop any chest pain or anginal equivalents.  HIV screening The patient falls between the ages of 13-64 and should be screened for HIV, therefore HIV testing ordered: Nonreactive.   Family Communication/Anticipated D/C date and plan/Code Status   DVT prophylaxis: SCDs ordered. Code Status: Full Code.  Family Communication: No family at bedside. Disposition Plan: Home  when stable.   Medical Consultants:    Gastroenterology   Procedures:    EGD 07/11/16  Anti-Infectives:    None  Subjective:   No BMs today.  No nausea or vomiting. No chest pain or dyspnea.   Objective:    Vitals:   07/11/16 0615 07/11/16 0836 07/11/16 1253 07/11/16 1327  BP: (!) 141/71 (!) 145/75 (!) 150/59 (!) 149/67  Pulse: 68 73 67 65  Resp: 18 18 18 14   Temp: 98.2 F (36.8 C) 98.4 F (36.9 C) 98.8 F (37.1 C) 99 F (37.2 C)  TempSrc: Oral Oral Oral Oral  SpO2: 98% 95% 99% 98%    Intake/Output Summary (Last 24 hours) at 07/11/16 1451 Last data filed at 07/11/16 0836  Gross per 24 hour  Intake          1822.83 ml  Output                0 ml  Net          1822.83 ml   There were no vitals filed for this visit.  Exam: General exam: Appears calm and comfortable.  Respiratory system: Clear to auscultation, diminished right base. Respiratory effort normal. Cardiovascular system: S1 & S2 heard, RRR. No JVD,  rubs, gallops or clicks. No murmurs. Gastrointestinal system: Abdomen is nondistended, soft and nontender. No organomegaly or masses felt. Normal bowel sounds heard. Central nervous system: Alert and oriented. No focal neurological deficits. Extremities: No clubbing,  or cyanosis. No edema. Skin: No rashes, lesions or ulcers. Psychiatry: Judgement and insight appear mildly impaired. Mood & affect appropriate.   Data Reviewed:   I have personally  reviewed following labs and imaging studies:  Labs: Basic Metabolic Panel:  Recent Labs Lab 07/10/16 1334 07/11/16 1039  NA 133* 138  K 3.4* 4.0  CL 100* 102  CO2 26 30  GLUCOSE 222* 119*  BUN 9 7  CREATININE 0.79 0.78  CALCIUM 9.2 9.9   GFR CrCl cannot be calculated (Unknown ideal weight.). Liver Function Tests:  Recent Labs Lab 07/10/16 1334  AST 23  ALT 14  ALKPHOS 81  BILITOT 0.4  PROT 7.2  ALBUMIN 3.4*   No results for input(s): LIPASE, AMYLASE in the last 168 hours. No results  for input(s): AMMONIA in the last 168 hours. Coagulation profile  Recent Labs Lab 07/10/16 1402  INR 1.02    CBC:  Recent Labs Lab 07/10/16 1334 07/10/16 2352 07/11/16 1039  WBC 10.0  --  13.9*  HGB 6.0* 5.2* 12.2  HCT 22.2* 24.9* 39.0  MCV 73.8*  --  79.1  PLT 408*  --  380   Cardiac Enzymes: No results for input(s): CKTOTAL, CKMB, CKMBINDEX, TROPONINI in the last 168 hours. BNP (last 3 results)  Recent Labs  04/11/16 0919  PROBNP 44   CBG:  Recent Labs Lab 07/10/16 1801 07/10/16 2143 07/11/16 0805 07/11/16 1304  GLUCAP 79 142* 104* 108*   D-Dimer: No results for input(s): DDIMER in the last 72 hours. Hgb A1c:  Recent Labs  07/10/16 1402  HGBA1C 6.5*   Lipid Profile: No results for input(s): CHOL, HDL, LDLCALC, TRIG, CHOLHDL, LDLDIRECT in the last 72 hours. Thyroid function studies: No results for input(s): TSH, T4TOTAL, T3FREE, THYROIDAB in the last 72 hours.  Invalid input(s): FREET3 Anemia work up:  Recent Labs  07/10/16 1402  VITAMINB12 541  FOLATE 33.4  FERRITIN 4*  TIBC 525*  IRON 13*  RETICCTPCT 3.0   Sepsis Labs:  Recent Labs Lab 07/10/16 1334 07/11/16 1039  WBC 10.0 13.9*    Microbiology No results found for this or any previous visit (from the past 240 hour(s)).  Radiology: No results found.  Medications:   . [MAR Hold] atenolol  50 mg Oral Daily  . [MAR Hold] atorvastatin  80 mg Oral q1800  . [MAR Hold] buPROPion  150 mg Oral Daily  . [MAR Hold] cholecalciferol  2,000 Units Oral Daily  . [MAR Hold] insulin aspart  0-5 Units Subcutaneous QHS  . [MAR Hold] insulin aspart  0-9 Units Subcutaneous TID WC  . [MAR Hold] pantoprazole  40 mg Intravenous Q12H   Continuous Infusions: . [MAR Hold] ferumoxytol 510 mg (07/11/16 1300)  . lactated ringers 1,000 mL (07/11/16 1347)  . pantoprozole (PROTONIX) infusion 8 mg/hr (07/11/16 0800)    Medical decision making is of high complexity and this patient is at high risk of  deterioration, therefore this is a level 3 visit.  (> 4 problem points, 2 data points, high risk: Need 2 out of 3)    LOS: 1 day   Shamarion Coots  [facility] Pager [phone]. If unable to reach me by pager, please call my cell phone at [phone].  *Please refer to [email or web address], password [email or web address] to get updated schedule on who will round on this patient, as hospitalists switch teams weekly. If 7PM-7AM, please contact night-coverage at [email or web address], password [email or web address] for any overnight needs.  07/11/2016, 2:51 PM

## 2016-07-11 NOTE — Transfer of Care (Signed)
Immediate Anesthesia Transfer of Care Note  Patient: Leah Norton  Procedure(s) Performed: Procedure(s): ESOPHAGOGASTRODUODENOSCOPY (EGD) (N/A)  Patient Location: Endoscopy Unit  Anesthesia Type:MAC  Level of Consciousness: awake, alert  and oriented  Airway & Oxygen Therapy: Patient Spontanous Breathing and Patient connected to nasal cannula oxygen  Post-op Assessment: Report given to RN and Post -op Vital signs reviewed and stable  Post vital signs: Reviewed and stable  Last Vitals:  Vitals:   07/11/16 1253 07/11/16 1327  BP: (!) 150/59 (!) 149/67  Pulse: 67 65  Resp: 18 14  Temp: 37.1 C 37.2 C    Last Pain:  Vitals:   07/11/16 1327  TempSrc: Oral         Complications: No apparent anesthesia complications

## 2016-07-12 LAB — TYPE AND SCREEN
ABO/RH(D): B POS
Antibody Screen: NEGATIVE
UNIT DIVISION: 0
UNIT DIVISION: 0
UNIT DIVISION: 0
UNIT DIVISION: 0
Unit division: 0

## 2016-07-12 LAB — BPAM RBC
BLOOD PRODUCT EXPIRATION DATE: 201805182359
BLOOD PRODUCT EXPIRATION DATE: 201805192359
BLOOD PRODUCT EXPIRATION DATE: 201805212359
Blood Product Expiration Date: 201805202359
Blood Product Expiration Date: 201805212359
ISSUE DATE / TIME: 201805011545
ISSUE DATE / TIME: 201805011808
ISSUE DATE / TIME: 201805020107
ISSUE DATE / TIME: 201805020328
ISSUE DATE / TIME: 201805020550
UNIT TYPE AND RH: 7300
UNIT TYPE AND RH: 7300
Unit Type and Rh: 7300
Unit Type and Rh: 7300
Unit Type and Rh: 7300

## 2016-07-12 LAB — GLUCOSE, CAPILLARY: GLUCOSE-CAPILLARY: 124 mg/dL — AB (ref 65–99)

## 2016-07-12 LAB — HEMOGLOBIN AND HEMATOCRIT, BLOOD
HCT: 36.9 % (ref 36.0–46.0)
Hemoglobin: 11.4 g/dL — ABNORMAL LOW (ref 12.0–15.0)

## 2016-07-12 NOTE — Discharge Summary (Addendum)
Physician Discharge Summary  ZUMA HUST TGP:[id number] DOB: [birth year] DOA: 07/10/2016  PCP: Kandice Hams, MD  Admit date: 07/10/2016 Discharge date: 07/12/2016  Admitted From: Home Discharge disposition: Home   Recommendations for Outpatient Follow-Up:   1. The patient will need a CBC done 07/16/16: If hemoglobin/hematocrit stable, will need to resume aspirin and Plavix.   Discharge Diagnosis:   Principal Problem:    GI bleed secondary to peptic duodenitis Active Problems:    DM (diabetes mellitus), type 2, uncontrolled with complications (HCC)    Acute versus chronic blood loss anemia    Cigarette smoker    Essential hypertension    CAD S/P percutaneous coronary angioplasty   Discharge Condition: Improved.  Diet recommendation: Low sodium, heart healthy.  Carbohydrate-modified.    History of Present Illness:   Leah Norton is an 61 y.o. female with a PMH of CAD status post DES placement 12/17 on chronic aspirin/Plavix, GERD, hypertension, dyslipidemia, type 2 diabetes, and ongoing tobacco abuse who was admitted 07/10/16 with a chief complaint of dark stools associated with fatigue for which she originally went to her PCP for and was found to have anemia and therefore was referred to the ED. Hemoglobin was found to be 6.0 on presentation.  Hospital Course by Problem:     GI bleed with acute blood loss anemia Aspirin/Plavix placed on hold with cardiology input. Evaluated by GI And underwent EGD 07/11/16 which showed no endoscopic evidence of bleeding, esophagitis or varices in the esophagus and normal stomach. Biopsies of the duodenum were obtained to rule out celiac disease. Received 1 unit of PRBCs in the ED and 3 units of PRBCs overnight 07/10/16 ---> 07/11/16. Post transfusion hemoglobin was 12.2.Hemoglobin remains stable on subsequent checks. Will need close outpatient follow-up since the source of bleeding was not identified, but appears to have stopped off  aspirin/Plavix.Pathology report ultimately came back showing peptic duodenitis which was thought to be the source of her GI bleeding in the context of being on aspirin/Plavix therapy.  Active Problems:   DM (diabetes mellitus), type 2, uncontrolled with complications ([place]) Currently being managed with insulin sensitive SSI Q AC/HS. CBGs 96-150. Resume oral hypoglycemics at discharge.    Cigarette smoker Tobacco cessation counseling per nursing.    Essential hypertension Currently on atenolol. Systolic pressures [id number].    CAD S/P percutaneous coronary angioplasty Difficult situation and dual antiplatelet therapy remains recommended to prevent restenosis. Unfortunately, with her significant GI bleeding, we have had to hold these agents. Cardiology recommends resuming aspirin/Plavix as soon as safe to do so. Discussed case with Dr. Julianne Handler who agrees with the plan to have her follow-up for CBC 07/16/16 and to resume aspirin and Plavix at that time if her hemoglobin remains stable.  HIV screening The patient falls between the ages of 13-64 and should be screened for HIV, therefore HIV testing ordered: Nonreactive.   Medical Consultants:    Cardiology  Gastroenterology   Discharge Exam:   Vitals:   07/12/16 0550 07/12/16 0943  BP: (!) 144/63 (!) 141/65  Pulse: 65 67  Resp: 18 16  Temp: 98.8 F (37.1 C) 98.6 F (37 C)   Vitals:   07/11/16 2132 07/12/16 0140 07/12/16 0550 07/12/16 0943  BP: 119/79 134/82 (!) 144/63 (!) 141/65  Pulse: 63 70 65 67  Resp: 18 18 18 16   Temp: 97.5 F (36.4 C) 98.1 F (36.7 C) 98.8 F (37.1 C) 98.6 F (37 C)  TempSrc: Oral Oral Oral Oral  SpO2: 99%  100% 96% 97%    General exam: Appears calm and comfortable.  Respiratory system: Clear to auscultation. Respiratory effort normal. Cardiovascular system: S1 & S2 heard, RRR. No JVD,  rubs, gallops or clicks. No murmurs. Gastrointestinal system: Abdomen is nondistended, soft and nontender.  No organomegaly or masses felt. Normal bowel sounds heard. Central nervous system: Alert and oriented. No focal neurological deficits. Extremities: No clubbing,  or cyanosis. No edema. Skin: No rashes, lesions or ulcers. Psychiatry: Judgement and insight appear normal. Mood & affect appropriate.    The results of significant diagnostics from this hospitalization (including imaging, microbiology, ancillary and laboratory) are listed below for reference.     Procedures and Diagnostic Studies:   No results found.   Labs:   Basic Metabolic Panel:  Recent Labs Lab 07/10/16 1334 07/11/16 1039  NA 133* 138  K 3.4* 4.0  CL 100* 102  CO2 26 30  GLUCOSE 222* 119*  BUN 9 7  CREATININE 0.79 0.78  CALCIUM 9.2 9.9   GFR CrCl cannot be calculated (Unknown ideal weight.). Liver Function Tests:  Recent Labs Lab 07/10/16 1334  AST 23  ALT 14  ALKPHOS 81  BILITOT 0.4  PROT 7.2  ALBUMIN 3.4*   No results for input(s): LIPASE, AMYLASE in the last 168 hours. No results for input(s): AMMONIA in the last 168 hours. Coagulation profile  Recent Labs Lab 07/10/16 1402  INR 1.02    CBC:  Recent Labs Lab 07/10/16 1334 07/10/16 2352 07/11/16 1039 07/11/16 1540 07/11/16 2330 07/12/16 0633  WBC 10.0  --  13.9*  --   --   --   HGB 6.0* 5.2* 12.2 11.6* 11.0* 11.4*  HCT 22.2* 24.9* 39.0 37.2 35.2* 36.9  MCV 73.8*  --  79.1  --   --   --   PLT 408*  --  380  --   --   --    Cardiac Enzymes: No results for input(s): CKTOTAL, CKMB, CKMBINDEX, TROPONINI in the last 168 hours. BNP: Invalid input(s): POCBNP CBG:  Recent Labs Lab 07/11/16 0805 07/11/16 1304 07/11/16 1659 07/11/16 2148 07/12/16 0735  GLUCAP 104* 108* 150* 96 124*   D-Dimer No results for input(s): DDIMER in the last 72 hours. Hgb A1c  Recent Labs  07/10/16 1402  HGBA1C 6.5*   Lipid Profile No results for input(s): CHOL, HDL, LDLCALC, TRIG, CHOLHDL, LDLDIRECT in the last 72 hours. Thyroid  function studies No results for input(s): TSH, T4TOTAL, T3FREE, THYROIDAB in the last 72 hours.  Invalid input(s): FREET3 Anemia work up  Recent Labs  07/10/16 1402  VITAMINB12 541  FOLATE 33.4  FERRITIN 4*  TIBC 525*  IRON 13*  RETICCTPCT 3.0   Microbiology No results found for this or any previous visit (from the past 240 hour(s)).   Discharge Instructions:   Discharge Instructions    Call MD for:  extreme fatigue    Complete by:  As directed    Call MD for:  persistant dizziness or light-headedness    Complete by:  As directed    Diet - low sodium heart healthy    Complete by:  As directed    Diet Carb Modified    Complete by:  As directed    Increase activity slowly    Complete by:  As directed      Allergies as of 07/12/2016      Reactions   Codeine Rash   Erythromycin Rash   Penicillins Rash   Has patient had a  PCN reaction causing immediate rash, facial/tongue/throat swelling, SOB or lightheadedness with hypotension: Yes Has patient had a PCN reaction causing severe rash involving mucus membranes or skin necrosis: No Has patient had a PCN reaction that required hospitalization: No Has patient had a PCN reaction occurring within the last 10 years: Yes If all of the above answers are "NO", then may proceed with Cephalosporin use.   Sulfonamide Derivatives Rash   Vicodin [hydrocodone-acetaminophen] Rash      Medication List    STOP taking these medications   aspirin EC 81 MG tablet   clopidogrel 75 MG tablet Commonly known as:  PLAVIX     TAKE these medications   albuterol 108 (90 Base) MCG/ACT inhaler Commonly known as:  PROVENTIL HFA;VENTOLIN HFA Inhale 1-2 puffs into the lungs every 6 (six) hours as needed for wheezing. Notes to patient:  As needed   atenolol 50 MG tablet Commonly known as:  TENORMIN Take 50 mg by mouth daily. Notes to patient:  07/13/16   atorvastatin 80 MG tablet Commonly known as:  LIPITOR Take 1 tablet (80 mg total) by  mouth daily at 6 PM. Notes to patient:  07/12/16   buPROPion 150 MG 12 hr tablet Commonly known as:  WELLBUTRIN SR Take 150 mg by mouth daily. Notes to patient:  07/13/16   [facility] Liver Oil Caps Take 1 capsule by mouth See admin instructions. One to two times a day Notes to patient:  07/12/16   hydrochlorothiazide 25 MG tablet Commonly known as:  HYDRODIURIL Take 25 mg by mouth every morning. Notes to patient:  07/13/16   losartan 100 MG tablet Commonly known as:  COZAAR Take 1 tablet (100 mg total) by mouth every morning. Notes to patient:  07/13/16   nitroGLYCERIN 0.4 MG SL tablet Commonly known as:  NITROSTAT Place 0.4 mg under the tongue every 5 (five) minutes as needed for chest pain (Call 911 at 3rd dose within 15 minutes.). Notes to patient:  As Needed   pantoprazole 40 MG tablet Commonly known as:  PROTONIX Take 1 tablet (40 mg total) by mouth daily.   sitaGLIPtin-metformin 50-1000 MG tablet Commonly known as:  JANUMET Take 1 tablet by mouth 2 (two) times daily with a meal.   Vitamin D 2000 units Caps Take 2,000 Units by mouth daily.      Follow-up Information    POLITE,RONALD D, MD. Schedule an appointment as soon as possible for a visit in 4 day(s).   Specialty:  Internal Medicine Why:  Repeat blood work on 07/16/16: Check CBC to ensure blood counts are stable. Contact information: [address] [place] [address] [phone]            Time coordinating discharge: Greater than 35 minutes.  Signed:  Buryl Bamber  Pager [phone] [facility] 07/12/2016, 4:36 PM

## 2016-07-12 NOTE — Progress Notes (Signed)
[name] to be D/C'd  per MD order. Discussed with the patient and all questions fully answered.  VSS, Skin clean, dry and intact without evidence of skin break down, no evidence of skin tears noted.  IV catheter discontinued intact. Site without signs and symptoms of complications. Dressing and pressure applied.  An After Visit Summary was printed and given to the patient. Patient received prescription.  D/c education completed with patient/family including follow up instructions, medication list, d/c activities limitations if indicated, with other d/c instructions as indicated by MD - patient able to verbalize understanding, all questions fully answered.   Patient instructed to return to ED, call 911, or call MD for any changes in condition.   Patient to be escorted via [place], and D/C home via private auto.

## 2016-07-12 NOTE — Discharge Instructions (Signed)
Blood Transfusion, Adult, Care After This sheet gives you information about how to care for yourself after your procedure. Your health care provider may also give you more specific instructions. If you have problems or questions, contact your health care provider. What can I expect after the procedure? After your procedure, it is common to have:  Bruising and soreness where the IV tube was inserted.  Headache. Follow these instructions at home:  Take over-the-counter and prescription medicines only as told by your health care provider.  Return to your normal activities as told by your health care provider.  Follow instructions from your health care provider about how to take care of your IV insertion site. Make sure you:  Wash your hands with soap and water before you change your bandage (dressing). If soap and water are not available, use hand sanitizer.  Change your dressing as told by your health care provider.  Check your IV insertion site every day for signs of infection. Check for:  More redness, swelling, or pain.  More fluid or blood.  Warmth.  Pus or a bad smell. Contact a health care provider if:  You have more redness, swelling, or pain around the IV insertion site.  You have more fluid or blood coming from the IV insertion site.  Your IV insertion site feels warm to the touch.  You have pus or a bad smell coming from the IV insertion site.  Your urine turns pink, red, or brown.  You feel weak after doing your normal activities. Get help right away if:  You have signs of a serious allergic or immune system reaction, including:  Itchiness.  Hives.  Trouble breathing.  Anxiety.  Chest or lower back pain.  Fever, flushing, and chills.  Rapid pulse.  Rash.  Diarrhea.  Vomiting.  Dark urine.  Serious headache.  Dizziness.  Stiff neck.  Yellow coloration of the face or the white parts of the eyes (jaundice). This information is not  intended to replace advice given to you by your health care provider. Make sure you discuss any questions you have with your health care provider. Document Released: 03/19/2014 Document Revised: 10/26/2015 Document Reviewed: 09/12/2015 Elsevier Interactive Patient Education  2017 [employer]  Anemia, Nonspecific Anemia is a condition in which the concentration of red blood cells or hemoglobin in the blood is below normal. Hemoglobin is a substance in red blood cells that carries oxygen to the tissues of the body. Anemia results in not enough oxygen reaching these tissues. What are the causes? Common causes of anemia include:  Excessive bleeding. Bleeding may be internal or external. This includes excessive bleeding from periods (in women) or from the intestine.  Poor nutrition.  Chronic kidney, thyroid, and liver disease.  Bone marrow disorders that decrease red blood cell production.  Cancer and treatments for cancer.  HIV, AIDS, and their treatments.  Spleen problems that increase red blood cell destruction.  Blood disorders.  Excess destruction of red blood cells due to infection, medicines, and autoimmune disorders. What are the signs or symptoms?  Minor weakness.  Dizziness.  Headache.  Palpitations.  Shortness of breath, especially with exercise.  Paleness.  Cold sensitivity.  Indigestion.  Nausea.  Difficulty sleeping.  Difficulty concentrating. Symptoms may occur suddenly or they may develop slowly. How is this diagnosed? Additional blood tests are often needed. These help your health care provider determine the best treatment. Your health care provider will check your stool for blood and look for other causes of  blood loss. How is this treated? Treatment varies depending on the cause of the anemia. Treatment can include:  Supplements of iron, vitamin T09, or folic acid.  Hormone medicines.  A blood transfusion. This may be needed if blood loss is  severe.  Hospitalization. This may be needed if there is significant continual blood loss.  Dietary changes.  Spleen removal. Follow these instructions at home: Keep all follow-up appointments. It often takes many weeks to correct anemia, and having your health care provider check on your condition and your response to treatment is very important. Get help right away if:  You develop extreme weakness, shortness of breath, or chest pain.  You become dizzy or have trouble concentrating.  You develop heavy vaginal bleeding.  You develop a rash.  You have bloody or black, tarry stools.  You faint.  You vomit up blood.  You vomit repeatedly.  You have abdominal pain.  You have a fever or persistent symptoms for more than 2-3 days.  You have a fever and your symptoms suddenly get worse.  You are dehydrated. This information is not intended to replace advice given to you by your health care provider. Make sure you discuss any questions you have with your health care provider. Document Released: 04/05/2004 Document Revised: 08/10/2015 Document Reviewed: 08/22/2012 Elsevier Interactive Patient Education  2017 [employer].

## 2016-07-13 ENCOUNTER — Emergency Department (HOSPITAL_COMMUNITY)
Admission: EM | Admit: 2016-07-13 | Discharge: 2016-07-13 | Disposition: A | Discharge status: Home / Self Care | Payer: BC Managed Care – PPO | Attending: Emergency Medicine | Admitting: Emergency Medicine

## 2016-07-13 ENCOUNTER — Ambulatory Visit (HOSPITAL_COMMUNITY): Payer: BC Managed Care – PPO

## 2016-07-13 ENCOUNTER — Encounter (HOSPITAL_COMMUNITY): Payer: Self-pay | Admitting: Emergency Medicine

## 2016-07-13 DIAGNOSIS — R220 Localized swelling, mass and lump, head: Secondary | ICD-10-CM | POA: Insufficient documentation

## 2016-07-13 DIAGNOSIS — E119 Type 2 diabetes mellitus without complications: Secondary | ICD-10-CM | POA: Diagnosis not present

## 2016-07-13 DIAGNOSIS — I10 Essential (primary) hypertension: Secondary | ICD-10-CM | POA: Insufficient documentation

## 2016-07-13 DIAGNOSIS — Z79899 Other long term (current) drug therapy: Secondary | ICD-10-CM | POA: Insufficient documentation

## 2016-07-13 DIAGNOSIS — Z7984 Long term (current) use of oral hypoglycemic drugs: Secondary | ICD-10-CM | POA: Diagnosis not present

## 2016-07-13 DIAGNOSIS — I2510 Atherosclerotic heart disease of native coronary artery without angina pectoris: Secondary | ICD-10-CM | POA: Insufficient documentation

## 2016-07-13 DIAGNOSIS — F17210 Nicotine dependence, cigarettes, uncomplicated: Secondary | ICD-10-CM | POA: Insufficient documentation

## 2016-07-13 MED ORDER — PREDNISONE 10 MG PO TABS: 20.0000 mg | ORAL_TABLET | Freq: Two times a day (BID) | ORAL | 0 refills | Status: DC

## 2016-07-13 MED ORDER — DIPHENHYDRAMINE HCL 50 MG/ML IJ SOLN
25.0000 mg | Freq: Once | INTRAMUSCULAR | Status: AC
  Administered 2016-07-13: 25 mg via INTRAVENOUS
  Filled 2016-07-13: qty 1

## 2016-07-13 MED ORDER — METHYLPREDNISOLONE SODIUM SUCC 125 MG IJ SOLR
125.0000 mg | Freq: Once | INTRAMUSCULAR | Status: AC
  Administered 2016-07-13: 125 mg via INTRAVENOUS
  Filled 2016-07-13: qty 2

## 2016-07-13 NOTE — ED Provider Notes (Signed)
[facility] DEPT Provider Note   CSN: [id number] Arrival date & time: 07/13/16  0022 By signing my name below, I, Dyke Brackett, attest that this documentation has been prepared under the direction and in the presence of Veryl Speak, MD . Electronically Signed: Dyke Brackett, Scribe. 07/13/2016. 2:21 AM.   History   Chief Complaint Chief Complaint  Patient presents with  . Facial Swelling    HPI Leah Norton is a 60 y.o. female with a history of CAD and DM type II who presents to the Emergency Department complaining of progressively worsening, constant facial swelling onset this morning. She was discharged from the hospital this morning after receiving a blood transfusion. Pt states she noticed a small bump similar to an insect bite to the left side of her face before she was discharged. Per pt, her face continued to swell after getting home today. No OTC treatments tried for these symptoms PTA.  No new soaps, lotions, detergents, foods, animals, plants, or medications. She denies any difficulty breathing, difficulty swallowing, tongue swelling, throat swelling, or any other associated symptoms.   The history is provided by the patient. No language interpreter was used.   Past Medical History:  Diagnosis Date  . Anginal pain ([place])   . Arthritis    "right anikle" (02/29/2016)  . CAD in native artery 02/08/2016   Three-vessel coronary artery calcification on CT scan  . Complication of anesthesia    "had reaction w/hives, rash, itching" (02/29/2016)  . Constipation   . Coronary artery disease    12/17 PCI with DESx1 to pLAD EF normal  . Enlarged heart   . GERD (gastroesophageal reflux disease)   . High cholesterol   . Hypertension   . Migraine    hx  . Symptomatic anemia 07/10/2016  . Type II diabetes mellitus [facility]     Patient Active Problem List   Diagnosis Date Noted  . GI bleed 07/10/2016  . Morbid obesity due to excess calories ([place]) 05/14/2016  . Dyspnea on  exertion 04/29/2016  . Abnormal nuclear stress test   . CAD S/P percutaneous coronary angioplasty 02/08/2016  . INSOMNIA, CHRONIC 02/28/2010  . HEADACHE 02/28/2010  . HEEL PAIN, RIGHT 11/29/2009  . COUGH DUE TO ACE INHIBITORS 11/29/2009  . ATRIAL ENLARGEMENT, LEFT 03/21/2009  . ANEMIA 12/28/2008  . CARDIAC MURMUR 12/28/2008  . OTHER NONSPECIFIC ABNORMAL SERUM ENZYME LEVELS 12/28/2008  . CERVICAL MUSCLE STRAIN 12/28/2008  . CONSTIPATION 06/01/2008  . POSTMENOPAUSAL STATUS 05/15/2008  . BRONCHITIS, CHRONIC 12/18/2007  . Cigarette smoker 08/19/2007  . DEGENERATIVE DISC DISEASE, LUMBOSACRAL SPINE 08/19/2007  . LOW BACK PAIN SYNDROME 08/19/2007  . UPPER RESPIRATORY INFECTION, VIRAL 03/05/2007  . NEOPLASM, SKIN, UNCERTAIN BEHAVIOR 93/81/8299  . GANGLION CYST, WRIST, LEFT 01/28/2007  . DENTAL PAIN 01/19/2007  . DM (diabetes mellitus), type 2, uncontrolled with complications ([place]) 37/16/9678  . Hyperlipidemia 12/10/2006  . Essential hypertension 12/10/2006  . RHINITIS, ALLERGIC NEC 12/10/2006  . HIP PAIN, RIGHT 12/10/2006    Past Surgical History:  Procedure Laterality Date  . ABDOMINAL HYSTERECTOMY    . CARDIAC CATHETERIZATION N/A 02/29/2016   Procedure: Left Heart Cath and Coronary Angiography;  Surgeon: Belva Crome, MD;  Location: [facility];  Service: Cardiovascular;  Laterality: N/A;  . CARDIAC CATHETERIZATION N/A 02/29/2016   Procedure: Coronary Stent Intervention;  Surgeon: Belva Crome, MD;  Location: [facility];  Service: Cardiovascular;  Laterality: N/A;  . CARDIAC CATHETERIZATION  2005   "no blockages"  .  COLONOSCOPY WITH PROPOFOL N/A 03/15/2014   Procedure: COLONOSCOPY WITH PROPOFOL;  Surgeon: Garlan Fair, MD;  Location: [facility];  Service: Endoscopy;  Laterality: N/A;  . ESOPHAGOGASTRODUODENOSCOPY N/A 07/11/2016   Procedure: ESOPHAGOGASTRODUODENOSCOPY (EGD);  Surgeon: Laurence Spates, MD;  Location: [facility] ENDOSCOPY;  Service: Endoscopy;  Laterality:  N/A;  . FRACTURE SURGERY    . MULTIPLE TOOTH EXTRACTIONS     teeth removed  . ORIF ANKLE FRACTURE Right 11/12/2012   Procedure: OPEN REDUCTION INTERNAL FIXATION (ORIF) ANKLE FRACTURE;  Surgeon: Marianna Payment, MD;  Location: [facility];  Service: Orthopedics;  Laterality: Right;  Open reduction internal fixation ankle fracture, trimalleolar    OB History    No data available       Home Medications    Prior to Admission medications   Medication Sig Start Date End Date Taking? Authorizing Provider  albuterol (PROVENTIL HFA;VENTOLIN HFA) 108 (90 BASE) MCG/ACT inhaler Inhale 1-2 puffs into the lungs every 6 (six) hours as needed for wheezing. 12/08/11   Fransico Meadow, PA-C  atenolol (TENORMIN) 50 MG tablet Take 50 mg by mouth daily.     Historical Provider, MD  atorvastatin (LIPITOR) 80 MG tablet Take 1 tablet (80 mg total) by mouth daily at 6 PM. Patient not taking: Reported on 07/10/2016 03/01/16   Cheryln Manly, NP  buPROPion [facility] SR) 150 MG 12 hr tablet Take 150 mg by mouth daily.     Historical Provider, MD  Cholecalciferol (VITAMIN D) 2000 units CAPS Take 2,000 Units by mouth daily.    Historical Provider, MD  [facility] Liver Oil CAPS Take 1 capsule by mouth See admin instructions. One to two times a day    Historical Provider, MD  hydrochlorothiazide (HYDRODIURIL) 25 MG tablet Take 25 mg by mouth every morning.     Historical Provider, MD  losartan (COZAAR) 100 MG tablet Take 1 tablet (100 mg total) by mouth every morning. 03/01/16   Cheryln Manly, NP  nitroGLYCERIN (NITROSTAT) 0.4 MG SL tablet Place 0.4 mg under the tongue every 5 (five) minutes as needed for chest pain (Call 911 at 3rd dose within 15 minutes.).    Historical Provider, MD  pantoprazole (PROTONIX) 40 MG tablet Take 1 tablet (40 mg total) by mouth daily. 03/09/16   Isaiah Serge, NP  sitaGLIPtin-metformin (JANUMET) 50-1000 MG per tablet Take 1 tablet by mouth 2 (two) times daily with a meal.    Historical  Provider, MD    Family History Family History  Problem Relation Age of Onset  . Hypertension Mother     Social History Social History  Substance Use Topics  . Smoking status: Current Every Day Smoker    Packs/day: 0.50    Years: 40.00    Types: Cigarettes  . Smokeless tobacco: Never Used     Comment: down to 2-5 cigarettes daily 05/14/2016  . Alcohol use Yes     Comment: 02/29/2016 "I'll have some wine or mixed drinks a few times/year; special occasions"     Allergies   Codeine; Erythromycin; Penicillins; Sulfonamide derivatives; and Vicodin [hydrocodone-acetaminophen]   Review of Systems Review of Systems All systems reviewed and are negative for acute change except as noted in the HPI.  Physical Exam Updated Vital Signs BP (!) 157/76 (BP Location: Right Arm)   Pulse 66   Temp 98.1 F (36.7 C) (Oral)   Resp 18   Ht 5' 5.5" (1.664 m)   Wt 180 lb (81.6 kg)   SpO2 98%  BMI 29.50 kg/m   Physical Exam  Constitutional: She is oriented to person, place, and time. She appears well-developed and well-nourished. No distress.  HENT:  Head: Normocephalic and atraumatic.  Mouth/Throat: Oropharynx is clear and moist.  There is swelling to both cheeks, eyelids, and upper lip.  Eyes: EOM are normal.  Neck: Normal range of motion.  Cardiovascular: Normal rate, regular rhythm and normal heart sounds.   Pulmonary/Chest: Effort normal and breath sounds normal. No stridor. No respiratory distress. She has no wheezes.  Abdominal: Soft. She exhibits no distension. There is no tenderness.  Musculoskeletal: Normal range of motion.  Neurological: She is alert and oriented to person, place, and time.  Skin: Skin is warm and dry.  Psychiatric: She has a normal mood and affect. Judgment normal.  Nursing note and vitals reviewed.  ED Treatments / Results  DIAGNOSTIC STUDIES:  Oxygen Saturation is 98% on RA, normal by my interpretation.    COORDINATION OF CARE:  2:20 AM Discussed  treatment plan which includes Solumedrol and Benadryl with pt at bedside and pt agreed to plan.   Labs (all labs ordered are listed, but only abnormal results are displayed) Labs Reviewed - No data to display  EKG  EKG Interpretation None       Radiology No results found.  Procedures Procedures (including critical care time)  Medications Ordered in ED Medications - No data to display   Initial Impression / Assessment and Plan / ED Course  I have reviewed the triage vital signs and the nursing notes.  Pertinent labs & imaging results that were available during my care of the patient were reviewed by me and considered in my medical decision making (see chart for details).  Patient presents here with complaints of facial swelling. She was recently admitted for anemia and had been transfused. She returns today with facial swelling. The swelling is to both cheeks and eyelids and appears allergic in nature. There is no significant erythema or redness that would suggest a cellulitis. She was given Solu-Medrol and IV Benadryl. Her symptoms have improved slightly, but have not worsened. There is no airway involvement. She will be discharged with prednisone, Benadryl, and follow-up as needed.  Final Clinical Impressions(s) / ED Diagnoses   Final diagnoses:  None    New Prescriptions New Prescriptions   No medications on file   I personally performed the services described in this documentation, which was scribed in my presence. The recorded information has been reviewed and is accurate.        Veryl Speak, MD 07/13/16 [phone]

## 2016-07-13 NOTE — Discharge Instructions (Signed)
Prednisone as prescribed.  Benadryl 25 mg every 6 hours for the next 2-3 days.  Return to the emergency department if you develop throat swelling, difficulty breathing or swallowing, or other new and concerning symptoms.

## 2016-07-13 NOTE — ED Triage Notes (Signed)
Pt to ED with c/o facial swelling.  Pt was just discharged from this hospital this am and receiving a blood transfusing.  Pt st's she noticed a small bump on left side of face before she was discharged and after getting home face has continued to swell.  Now also has itching to area.  No resp distress present

## 2016-07-16 ENCOUNTER — Ambulatory Visit (HOSPITAL_COMMUNITY): Payer: BC Managed Care – PPO

## 2016-07-18 ENCOUNTER — Ambulatory Visit (HOSPITAL_COMMUNITY): Payer: BC Managed Care – PPO

## 2016-07-20 ENCOUNTER — Ambulatory Visit (HOSPITAL_COMMUNITY): Payer: BC Managed Care – PPO

## 2016-07-23 ENCOUNTER — Ambulatory Visit (HOSPITAL_COMMUNITY): Payer: BC Managed Care – PPO

## 2016-07-25 ENCOUNTER — Ambulatory Visit (HOSPITAL_COMMUNITY): Payer: BC Managed Care – PPO

## 2016-07-27 ENCOUNTER — Ambulatory Visit (HOSPITAL_COMMUNITY): Payer: BC Managed Care – PPO

## 2016-07-30 ENCOUNTER — Ambulatory Visit (HOSPITAL_COMMUNITY): Payer: BC Managed Care – PPO

## 2016-08-01 ENCOUNTER — Ambulatory Visit (HOSPITAL_COMMUNITY): Payer: BC Managed Care – PPO

## 2016-08-03 ENCOUNTER — Ambulatory Visit (HOSPITAL_COMMUNITY): Payer: BC Managed Care – PPO

## 2016-08-06 ENCOUNTER — Ambulatory Visit (HOSPITAL_COMMUNITY): Payer: BC Managed Care – PPO

## 2016-08-08 ENCOUNTER — Ambulatory Visit (HOSPITAL_COMMUNITY): Payer: BC Managed Care – PPO

## 2016-08-10 ENCOUNTER — Ambulatory Visit (HOSPITAL_COMMUNITY): Payer: BC Managed Care – PPO

## 2016-08-13 ENCOUNTER — Ambulatory Visit (HOSPITAL_COMMUNITY): Payer: BC Managed Care – PPO

## 2016-08-13 NOTE — Addendum Note (Signed)
Addendum  created 08/13/16 1446 by Oleta Mouse, MD   Sign clinical note

## 2016-09-02 NOTE — Progress Notes (Signed)
Cardiology Office Note    Date:  09/03/2016   ID:  Leah Norton, DOB 09-13-1955, MRN [id number]  PCP:  Seward Carol, MD  Cardiologist: Sinclair Grooms, MD   Chief Complaint  Patient presents with  . Coronary Artery Disease    History of Present Illness:  Leah Norton is a 61 y.o. female with history of diabetes, hyper-lipidemia, hypertension, tobacco abuse, nonobstructive coronary disease by angiography in 2005. Recent nuclear study demonstrated evidence of ischemia. Subsequent catheterization demonstrated 75-85% proximal LAD which was stented with DES. Brilinta caused dyspnea and has been discontinued with substitution of Plavix  Since her last visit, she has been admitted with severe anemia that was coincidentally identified on a routine office visit. Hemoglobin was 6. She required 3 units of blood. Upper GI evaluation was unremarkable. She had colonoscopy done several years ago. She did have an instance of dark stools. No angina.   Past Medical History:  Diagnosis Date  . Anginal pain ([place])   . Arthritis    "right anikle" (02/29/2016)  . CAD in native artery 02/08/2016   Three-vessel coronary artery calcification on CT scan  . Complication of anesthesia    "had reaction w/hives, rash, itching" (02/29/2016)  . Constipation   . Coronary artery disease    12/17 PCI with DESx1 to pLAD EF normal  . Enlarged heart   . GERD (gastroesophageal reflux disease)   . High cholesterol   . Hypertension   . Migraine    hx  . Symptomatic anemia 07/10/2016  . Type II diabetes mellitus ([place])     Past Surgical History:  Procedure Laterality Date  . ABDOMINAL HYSTERECTOMY    . CARDIAC CATHETERIZATION N/A 02/29/2016   Procedure: Left Heart Cath and Coronary Angiography;  Surgeon: Belva Crome, MD;  Location: [facility];  Service: Cardiovascular;  Laterality: N/A;  . CARDIAC CATHETERIZATION N/A 02/29/2016   Procedure: Coronary Stent Intervention;  Surgeon: Belva Crome, MD;  Location: [facility];  Service: Cardiovascular;  Laterality: N/A;  . CARDIAC CATHETERIZATION  2005   "no blockages"  . COLONOSCOPY WITH PROPOFOL N/A 03/15/2014   Procedure: COLONOSCOPY WITH PROPOFOL;  Surgeon: Garlan Fair, MD;  Location: [facility];  Service: Endoscopy;  Laterality: N/A;  . ESOPHAGOGASTRODUODENOSCOPY N/A 07/11/2016   Procedure: ESOPHAGOGASTRODUODENOSCOPY (EGD);  Surgeon: Laurence Spates, MD;  Location[facility] ENDOSCOPY;  Service: Endoscopy;  Laterality: N/A;  . FRACTURE SURGERY    . MULTIPLE TOOTH EXTRACTIONS     teeth removed  . ORIF ANKLE FRACTURE Right 11/12/2012   Procedure: OPEN REDUCTION INTERNAL FIXATION (ORIF) ANKLE FRACTURE;  Surgeon: Marianna Payment, MD;  Location: [facility];  Service: Orthopedics;  Laterality: Right;  Open reduction internal fixation ankle fracture, trimalleolar    Current Medications: Outpatient Medications Prior to Visit  Medication Sig Dispense Refill  . albuterol (PROVENTIL HFA;VENTOLIN HFA) 108 (90 BASE) MCG/ACT inhaler Inhale 1-2 puffs into the lungs every 6 (six) hours as needed for wheezing. 1 Inhaler 0  . atenolol (TENORMIN) 50 MG tablet Take 50 mg by mouth daily.     Marland Kitchen atorvastatin (LIPITOR) 80 MG tablet Take 1 tablet (80 mg total) by mouth daily at 6 PM. 30 tablet 6  . buPROPion (WELLBUTRIN SR) 150 MG 12 hr tablet Take 150 mg by mouth daily.     . Cholecalciferol (VITAMIN D) 2000 units CAPS Take 2,000 Units by mouth daily.    Marland Kitchen Cod Liver Oil CAPS Take 1 capsule  by mouth See admin instructions. One to two times a day    . hydrochlorothiazide (HYDRODIURIL) 25 MG tablet Take 25 mg by mouth every morning.     Marland Kitchen losartan (COZAAR) 100 MG tablet Take 1 tablet (100 mg total) by mouth every morning. 30 tablet 6  . nitroGLYCERIN (NITROSTAT) 0.4 MG SL tablet Place 0.4 mg under the tongue every 5 (five) minutes as needed for chest pain (Call 911 at 3rd dose within 15 minutes.).    Marland Kitchen pantoprazole (PROTONIX) 40 MG tablet Take 1 tablet  (40 mg total) by mouth daily. 30 tablet 11  . predniSONE (DELTASONE) 10 MG tablet Take 2 tablets (20 mg total) by mouth 2 (two) times daily with a meal. 12 tablet 0  . sitaGLIPtin-metformin (JANUMET) 50-1000 MG per tablet Take 1 tablet by mouth 2 (two) times daily with a meal.     No facility-administered medications prior to visit.      Allergies:   Codeine; Erythromycin; Penicillins; Sulfonamide derivatives; and Vicodin [hydrocodone-acetaminophen]   Social History   Social History  . Marital status: Single    Spouse name: N/A  . Number of children: N/A  . Years of education: N/A   Social History Main Topics  . Smoking status: Current Every Day Smoker    Packs/day: 0.50    Years: 40.00    Types: Cigarettes  . Smokeless tobacco: Never Used     Comment: down to 2-5 cigarettes daily 05/14/2016  . Alcohol use Yes     Comment: 02/29/2016 "I'll have some wine or mixed drinks a few times/year; special occasions"  . Drug use: No  . Sexual activity: Not Currently   Other Topics Concern  . None   Social History Narrative  . None     Family History:  The patient's family history includes Hypertension in her mother.   ROS:   Please see the history of present illness.    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 Recent ER visit for facial swelling. Complaining of constipation, joint swelling, muscle pain, and cough. Will need further evaluation by [facility].  All other systems reviewed and are negative.   PHYSICAL EXAM:   VS:  BP 122/62   Pulse 68   Ht 5' 5.5" (1.664 m)   Wt 185 lb (83.9 kg)   BMI 30.32 kg/m    GEN: Well nourished, well developed, in no acute distress  HEENT: normal  Neck: no JVD, carotid bruits, or masses Cardiac: RRR; no murmurs, rubs, or gallops,no edema  Respiratory:  clear to auscultation bilaterally, normal work of breathing GI: soft, nontender, nondistended, + BS MS: no deformity or atrophy  Skin: warm and  dry, no rash Neuro:  Alert and Oriented x 3, Strength and sensation are intact Psych: euthymic mood, full affect  Wt Readings from Last 3 Encounters:  09/03/16 185 lb (83.9 kg)  07/13/16 180 lb (81.6 kg)  05/14/16 188 lb (85.3 kg)      Studies/Labs Reviewed:   EKG:  EKG  Not repeated  Recent Labs: 04/11/2016: NT-Pro BNP 44 04/19/2016: Magnesium 1.6 07/10/2016: ALT 14 07/11/2016: BUN 7; Creatinine, Ser 0.78; Platelets 380; Potassium 4.0; Sodium 138 07/12/2016: Hemoglobin 11.4   Lipid Panel    Component Value Date/Time   CHOL 130 03/09/2016 1246   TRIG 203 (H) 03/09/2016 1246   HDL 34 (L) 03/09/2016 1246   CHOLHDL 3.8 03/09/2016 1246   VLDL 41 (H) 03/09/2016 1246  [place] 55 03/09/2016 1246    Additional studies/ records that were reviewed today include:  Recent upper GI evaluation negative for culprit    ASSESSMENT:    1. CAD S/P percutaneous coronary angioplasty   2. Essential hypertension   3. Gastrointestinal hemorrhage, unspecified gastrointestinal hemorrhage type      PLAN:  In order of problems listed above:  1. Stable and now 6 months out from elective LAD stent for proximal LAD disease. If needed, dual antiplatelet therapy can be discontinued at this time however we prefer to complete a 12 month course of therapy. 2. Blood pressure is under excellent control. No change in therapy. 3. Being evaluated for GI bleeding. Has follow-up with [facility].  It will be okay to hold Plavix up to 7 days to allow GI workup. She is not greater than 6 months out from stent implantation in a non-ACS setting. If recurrent anemia, Plavix can be discontinued, although if possible would like to complete 12 4 months of dual antiplatelet therapy.  Medication Adjustments/Labs and Tests Ordered: Current medicines are reviewed at length with the patient today.  Concerns regarding medicines are outlined above.  Medication changes, Labs and Tests ordered today are listed in the Patient  Instructions below. Patient Instructions  Medication Instructions:  None  Labwork: None  Testing/Procedures: None  Follow-Up: Your physician wants you to follow-up in: January 2019 with Dr. Tamala Julian.  You will receive a reminder letter in the mail two months in advance. If you don't receive a letter, please call our office to schedule the follow-up appointment.   Any Other Special Instructions Will Be Listed Below (If Applicable).     If you need a refill on your cardiac medications before your next appointment, please call your pharmacy.      Signed, Sinclair Grooms, MD  09/03/2016 9:19 AM    [facility] [place], [place], [place]  [address] Phone: [phone]; Fax: [phone]

## 2016-09-03 ENCOUNTER — Ambulatory Visit (INDEPENDENT_AMBULATORY_CARE_PROVIDER_SITE_OTHER): Payer: BC Managed Care – PPO | Admitting: Interventional Cardiology

## 2016-09-03 ENCOUNTER — Encounter: Payer: Self-pay | Admitting: Interventional Cardiology

## 2016-09-03 VITALS — BP 122/62 | HR 68 | Ht 65.5 in | Wt 185.0 lb

## 2016-09-03 DIAGNOSIS — I10 Essential (primary) hypertension: Secondary | ICD-10-CM

## 2016-09-03 DIAGNOSIS — Z9861 Coronary angioplasty status: Secondary | ICD-10-CM

## 2016-09-03 DIAGNOSIS — I2510 Atherosclerotic heart disease of native coronary artery without angina pectoris: Secondary | ICD-10-CM

## 2016-09-03 DIAGNOSIS — K922 Gastrointestinal hemorrhage, unspecified: Secondary | ICD-10-CM

## 2016-09-03 NOTE — Patient Instructions (Signed)
Medication Instructions:  None  Labwork: None  Testing/Procedures: None  Follow-Up: Your physician wants you to follow-up in: January 2019 with Dr. Tamala Julian.  You will receive a reminder letter in the mail two months in advance. If you don't receive a letter, please call our office to schedule the follow-up appointment.   Any Other Special Instructions Will Be Listed Below (If Applicable).     If you need a refill on your cardiac medications before your next appointment, please call your pharmacy.

## 2016-10-07 ENCOUNTER — Ambulatory Visit (INDEPENDENT_AMBULATORY_CARE_PROVIDER_SITE_OTHER)
Admission: EM | Admit: 2016-10-07 | Discharge: 2016-10-07 | Disposition: A | Discharge status: Nursing Home (Includes ICF) | Payer: BC Managed Care – PPO | Source: Home / Self Care | Attending: Internal Medicine | Admitting: Internal Medicine

## 2016-10-07 ENCOUNTER — Encounter (HOSPITAL_COMMUNITY): Payer: Self-pay | Admitting: Emergency Medicine

## 2016-10-07 ENCOUNTER — Emergency Department (HOSPITAL_COMMUNITY)
Admission: EM | Admit: 2016-10-07 | Discharge: 2016-10-07 | Disposition: A | Discharge status: Home / Self Care | Payer: BC Managed Care – PPO | Attending: Emergency Medicine | Admitting: Emergency Medicine

## 2016-10-07 ENCOUNTER — Emergency Department (HOSPITAL_COMMUNITY): Payer: BC Managed Care – PPO

## 2016-10-07 ENCOUNTER — Encounter (HOSPITAL_COMMUNITY): Payer: Self-pay | Admitting: *Deleted

## 2016-10-07 DIAGNOSIS — E119 Type 2 diabetes mellitus without complications: Secondary | ICD-10-CM | POA: Insufficient documentation

## 2016-10-07 DIAGNOSIS — J029 Acute pharyngitis, unspecified: Secondary | ICD-10-CM | POA: Diagnosis not present

## 2016-10-07 DIAGNOSIS — R51 Headache: Secondary | ICD-10-CM

## 2016-10-07 DIAGNOSIS — M542 Cervicalgia: Secondary | ICD-10-CM | POA: Insufficient documentation

## 2016-10-07 DIAGNOSIS — I2510 Atherosclerotic heart disease of native coronary artery without angina pectoris: Secondary | ICD-10-CM | POA: Diagnosis not present

## 2016-10-07 DIAGNOSIS — F17210 Nicotine dependence, cigarettes, uncomplicated: Secondary | ICD-10-CM | POA: Insufficient documentation

## 2016-10-07 DIAGNOSIS — M436 Torticollis: Secondary | ICD-10-CM

## 2016-10-07 DIAGNOSIS — I10 Essential (primary) hypertension: Secondary | ICD-10-CM | POA: Diagnosis not present

## 2016-10-07 DIAGNOSIS — E041 Nontoxic single thyroid nodule: Secondary | ICD-10-CM | POA: Insufficient documentation

## 2016-10-07 LAB — CBC WITH DIFFERENTIAL/PLATELET
Basophils Absolute: 0 10*3/uL (ref 0.0–0.1)
Basophils Relative: 0 %
Eosinophils Absolute: 0.3 10*3/uL (ref 0.0–0.7)
Eosinophils Relative: 3 %
HCT: 32.1 % — ABNORMAL LOW (ref 36.0–46.0)
Hemoglobin: 9.8 g/dL — ABNORMAL LOW (ref 12.0–15.0)
Lymphocytes Relative: 13 %
Lymphs Abs: 1.4 10*3/uL (ref 0.7–4.0)
MCH: 28.2 pg (ref 26.0–34.0)
MCHC: 30.5 g/dL (ref 30.0–36.0)
MCV: 92.5 fL (ref 78.0–100.0)
Monocytes Absolute: 0.9 10*3/uL (ref 0.1–1.0)
Monocytes Relative: 8 %
Neutro Abs: 7.8 10*3/uL — ABNORMAL HIGH (ref 1.7–7.7)
Neutrophils Relative %: 76 %
Platelets: 406 10*3/uL — ABNORMAL HIGH (ref 150–400)
RBC: 3.47 MIL/uL — ABNORMAL LOW (ref 3.87–5.11)
RDW: 16.6 % — ABNORMAL HIGH (ref 11.5–15.5)
WBC: 10.3 10*3/uL (ref 4.0–10.5)

## 2016-10-07 LAB — BASIC METABOLIC PANEL
Anion gap: 8 (ref 5–15)
BUN: 7 mg/dL (ref 6–20)
CO2: 29 mmol/L (ref 22–32)
Calcium: 9.8 mg/dL (ref 8.9–10.3)
Chloride: 102 mmol/L (ref 101–111)
Creatinine, Ser: 0.61 mg/dL (ref 0.44–1.00)
GFR calc Af Amer: >60 mL/min (ref >60)
GFR calc non Af Amer: >60 mL/min (ref >60)
Glucose, Bld: 95 mg/dL (ref 65–99)
Potassium: 3.8 mmol/L (ref 3.5–5.1)
Sodium: 139 mmol/L (ref 135–145)

## 2016-10-07 MED ORDER — METHOCARBAMOL 500 MG PO TABS
500.0000 mg | ORAL_TABLET | Freq: Two times a day (BID) | ORAL | 0 refills | Status: DC
Start: 2016-10-07 — End: 2017-04-15

## 2016-10-07 MED ORDER — METHOCARBAMOL 500 MG PO TABS
500.0000 mg | ORAL_TABLET | Freq: Once | ORAL | Status: AC
  Administered 2016-10-07: 500 mg via ORAL
  Filled 2016-10-07: qty 1

## 2016-10-07 MED ORDER — SODIUM CHLORIDE 0.9 % IV BOLUS (SEPSIS)
500.0000 mL | Freq: Once | INTRAVENOUS | Status: AC
  Administered 2016-10-07: 500 mL via INTRAVENOUS

## 2016-10-07 MED ORDER — IOPAMIDOL (ISOVUE-300) INJECTION 61%
INTRAVENOUS | Status: AC
  Administered 2016-10-07: 75 mL
  Filled 2016-10-07: qty 75

## 2016-10-07 NOTE — Discharge Instructions (Signed)
You were seen in the ED today with neck stiffness. The CT scan did not show a reason for your stiffness but we are treating your muscle spasm with muscle relaxer. Follow up with your PCP in the coming week. Also discuss the thyroid nodule found on the CT to schedule an outpatient ultrasound.   Return to the ED with any worsening pain, difficulty swallowing, or difficulty breathing.

## 2016-10-07 NOTE — ED Triage Notes (Signed)
Patient arrives with 5 days of stiff neck. States it started Wednesday. Patient explains that Thursday the tension eased and she was able to move her head. Then Saturday after working the tension came back. Denies fever or other illness. Currently endorses no pain unless moving her head.

## 2016-10-07 NOTE — ED Provider Notes (Signed)
Emergency Department Provider Note   I have reviewed the triage vital signs and the nursing notes.   HISTORY  Chief Complaint Torticollis   HPI Leah Norton is a 61 y.o. female with PMH of CAD, GERD, HLD, HTN, and DM presents to the emergency department for evaluation of neck stiffness for the past 5 days. She has tried Motrin, heating pad, and muscle rubs with no relief. She denies any fevers or chills. She's developed an associated sore throat has no difficulty speaking, swallowing, eating. She does have a mild headache. No vision changes. No pain in the chest or difficult to breathing. She went to Urgent Care today and was referred to the ED for further evaluation.   Past Medical History:  Diagnosis Date  . Anginal pain ([place])   . Arthritis    "right anikle" (02/29/2016)  . CAD in native artery 02/08/2016   Three-vessel coronary artery calcification on CT scan  . Complication of anesthesia    "had reaction w/hives, rash, itching" (02/29/2016)  . Constipation   . Coronary artery disease    12/17 PCI with DESx1 to pLAD EF normal  . Enlarged heart   . GERD (gastroesophageal reflux disease)   . High cholesterol   . Hypertension   . Migraine    hx  . Symptomatic anemia 07/10/2016  . Type II diabetes mellitus [facility])     Patient Active Problem List   Diagnosis Date Noted  . GI bleed 07/10/2016  . Morbid obesity due to excess calories ([place]) 05/14/2016  . Dyspnea on exertion 04/29/2016  . Abnormal nuclear stress test   . CAD S/P percutaneous coronary angioplasty 02/08/2016  . INSOMNIA, CHRONIC 02/28/2010  . HEADACHE 02/28/2010  . HEEL PAIN, RIGHT 11/29/2009  . COUGH DUE TO ACE INHIBITORS 11/29/2009  . ATRIAL ENLARGEMENT, LEFT 03/21/2009  . ANEMIA 12/28/2008  . CARDIAC MURMUR 12/28/2008  . OTHER NONSPECIFIC ABNORMAL SERUM ENZYME LEVELS 12/28/2008  . CERVICAL MUSCLE STRAIN 12/28/2008  . CONSTIPATION 06/01/2008  . POSTMENOPAUSAL STATUS 05/15/2008  . BRONCHITIS,  CHRONIC 12/18/2007  . Cigarette smoker 08/19/2007  . DEGENERATIVE DISC DISEASE, LUMBOSACRAL SPINE 08/19/2007  . LOW BACK PAIN SYNDROME 08/19/2007  . UPPER RESPIRATORY INFECTION, VIRAL 03/05/2007  . NEOPLASM, SKIN, UNCERTAIN BEHAVIOR 81/19/1478  . GANGLION CYST, WRIST, LEFT 01/28/2007  . DENTAL PAIN 01/19/2007  . DM (diabetes mellitus), type 2, uncontrolled with complications ([place]) 29/56/2130  . Hyperlipidemia 12/10/2006  . Essential hypertension 12/10/2006  . RHINITIS, ALLERGIC NEC 12/10/2006  . HIP PAIN, RIGHT 12/10/2006    Past Surgical History:  Procedure Laterality Date  . ABDOMINAL HYSTERECTOMY    . CARDIAC CATHETERIZATION N/A 02/29/2016   Procedure: Left Heart Cath and Coronary Angiography;  Surgeon: Belva Crome, MD;  Location: [place] CV LAB;  Service: Cardiovascular;  Laterality: N/A;  . CARDIAC CATHETERIZATION N/A 02/29/2016   Procedure: Coronary Stent Intervention;  Surgeon: Belva Crome, MD;  Location: [place] CV LAB;  Service: Cardiovascular;  Laterality: N/A;  . CARDIAC CATHETERIZATION  2005   "no blockages"  . COLONOSCOPY WITH PROPOFOL N/A 03/15/2014   Procedure: COLONOSCOPY WITH PROPOFOL;  Surgeon: Garlan Fair, MD;  Location: [facility]OS[facility];  Service: Endoscopy;  Laterality: N/A;  . CORONARY ANGIOPLASTY WITH STENT PLACEMENT    . ESOPHAGOGASTRODUODENOSCOPY N/A 07/11/2016   Procedure: ESOPHAGOGASTRODUODENOSCOPY (EGD);  Surgeon: Laurence Spates, MD;  Location: [facility] ENDOSCOPY;  Service: Endoscopy;  Laterality: N/A;  . FRACTURE SURGERY    . MULTIPLE TOOTH EXTRACTIONS     teeth  removed  . ORIF ANKLE FRACTURE Right 11/12/2012   Procedure: OPEN REDUCTION INTERNAL FIXATION (ORIF) ANKLE FRACTURE;  Surgeon: Marianna Payment, MD;  Location: [place];  Service: Orthopedics;  Laterality: Right;  Open reduction internal fixation ankle fracture, trimalleolar    Current Outpatient Rx  . Order #: [id number] Class: Print  . Order #: [id number] Class: Historical Med  . Order #:  [id number] Class: Normal  . Order #: [id number] Class: Historical Med  . Order #: [id number] Class: Historical Med  . Order #: [id number] Class: Historical Med  . Order #: [id number] Class: Historical Med  . Order #: [id number] Class: Historical Med  . Order #: [id number] Class: Normal  . Order #: [id number] Class: Print  . Order #: [id number] Class: Historical Med  . Order #: [id number] Class: Normal  . Order #: [id number] Class: Print  . Order #: [id number] Class: Historical Med    Allergies Codeine; Erythromycin; Penicillins; Sulfonamide derivatives; and Vicodin [hydrocodone-acetaminophen]  Family History  Problem Relation Age of Onset  . Hypertension Mother     Social History Social History  Substance Use Topics  . Smoking status: Current Every Day Smoker    Packs/day: 0.50    Years: 40.00    Types: Cigarettes  . Smokeless tobacco: Never Used     Comment: down to 2-5 cigarettes daily 05/14/2016  . Alcohol use Yes     Comment: occasionally    Review of Systems  Constitutional: No fever/chills Eyes: No visual changes. ENT: Positive sore throat and neck stiffness.  Cardiovascular: Denies chest pain. Respiratory: Denies shortness of breath. Gastrointestinal: No abdominal pain.  No nausea, no vomiting.  No diarrhea.  No constipation. Genitourinary: Negative for dysuria. Musculoskeletal: Negative for back pain. Skin: Negative for rash. Neurological: Negative for headaches, focal weakness or numbness.  10-point ROS otherwise negative.  ____________________________________________   PHYSICAL EXAM:  VITAL SIGNS: ED Triage Vitals  Enc Vitals Group     BP 10/07/16 1923 (!) 155/84     Pulse Rate 10/07/16 1923 72     Resp 10/07/16 1923 16     Temp 10/07/16 1923 98.9 F (37.2 C)     Temp Source 10/07/16 1923 Oral     SpO2 10/07/16 1923 100 %     Weight 10/07/16 1923 180 lb (81.6 kg)     Height 10/07/16 1923 5' 5.5" (1.664 m)     Pain Score 10/07/16 1929 0   Constitutional: Alert and  oriented. Well appearing and in no acute distress. Eyes: Conjunctivae are normal. Head: Atraumatic. Nose: No congestion/rhinnorhea. Mouth/Throat: Mucous membranes are moist.  Oropharynx non-erythematous. No PTA. Normal tone of voice. Managing oral secretions.  Neck: No stridor.  No meningeal signs. No cervical spine tenderness to palpation. Positive neck stiffness with tenderness to palpation over bilateral trapezius muscles.  Cardiovascular: Normal rate, regular rhythm. Good peripheral circulation. Grossly normal heart sounds.   Respiratory: Normal respiratory effort.  No retractions. Lungs CTAB. Gastrointestinal: Soft and nontender. No distention.  Musculoskeletal: No lower extremity tenderness nor edema. No gross deformities of extremities. Neurologic:  Normal speech and language. No gross focal neurologic deficits are appreciated.  Skin:  Skin is warm, dry and intact. No rash noted.  ____________________________________________   LABS (all labs ordered are listed, but only abnormal results are displayed)  Labs from today at [facility] reviewed. ____________________________________________  RADIOLOGY  Ct Soft Tissue Neck W Contrast  Result Date: 10/07/2016 CLINICAL DATA:  61 y/o  F; sore throat and torticollis. EXAM: CT NECK WITH CONTRAST TECHNIQUE: Multidetector CT imaging of the neck was performed using the  standard protocol following the bolus administration of intravenous contrast. CONTRAST:  45mL ISOVUE-300 IOPAMIDOL (ISOVUE-300) INJECTION 61% COMPARISON:  None. FINDINGS: Pharynx and larynx: No exophytic mass or abnormal mucosal enhancement. Salivary glands: No inflammation, mass, or stone. Thyroid: Thyroid nodules measuring up to 18 mm in the right lobe of thyroid (series 3, image 67). Thyroid isthmus extending superiorly along the anterior margin of the larynx to the level of the hyoid bone (series 5, image 43). Lymph nodes: None enlarged or abnormal density. Vascular: Mild non stenotic  calcified plaque of the carotid bifurcations. Limited intracranial: Negative. Visualized orbits: Negative. Mastoids and visualized paranasal sinuses: Clear. Skeleton: Multilevel cervical spondylosis greatest at the C4-5 and C5-6 levels for calcified disc osteophyte complexes result in moderate canal stenosis. Bones are diffusely sclerotic. Upper chest: Negative. Other: None. IMPRESSION: 1. No exophytic mass or abnormal enhancement of the pharynx or larynx. No lymphadenopathy. 2. Thyroid nodules measuring up to 18 mm on the right. Further evaluation with thyroid ultrasound is recommended on a nonemergent basis. 3. Mild non stenotic calcific atherosclerosis of carotid bifurcations. 4. Diffuse osteosclerosis, possibly representing disorder of calcium metabolism or marrow replacement / fibroses process. 5. Cervical degenerative changes greatest at C4-5 and C5-6 levels. Electronically Signed   By: Kristine Garbe M.D.   On: 10/07/2016 23:02    ____________________________________________   PROCEDURES  Procedure(s) performed:   Procedures  None ____________________________________________   INITIAL IMPRESSION / ASSESSMENT AND PLAN / ED COURSE  Pertinent labs & imaging results that were available during my care of the patient were reviewed by me and considered in my medical decision making (see chart for details).  Patient presents to the emergency room in for evaluation of neck stiffness and sore throat. Stiffness began first and has persisted for 5 days despite symptomatic treatment at home. Sore throat developed later. No obvious peritonsillar abscess or other oropharyngeal abnormality. Given her significant stiffness plan to obtain CT imaging of the neck to rule out deep space infection but low suspicion for this in the absence of fever. Really low suspicion for meningitis with no fever, chills, and only mild headache. No meningismus. Patient discomfort seems localized to the trapezius  musculature bilaterally.   CT neck negative. Feeling better after Robaxin. Plan for PCP follow up. Discussed Thyroid nodule and need for ultrasound through PCP.   At this time, I do not feel there is any life-threatening condition present. I have reviewed and discussed all results (EKG, imaging, lab, urine as appropriate), exam findings with patient. I have reviewed nursing notes and appropriate previous records.  I feel the patient is safe to be discharged home without further emergent workup. Discussed usual and customary return precautions. Patient and family (if present) verbalize understanding and are comfortable with this plan.  Patient will follow-up with their primary care provider. If they do not have a primary care provider, information for follow-up has been provided to them. All questions have been answered.  ____________________________________________  FINAL CLINICAL IMPRESSION(S) / ED DIAGNOSES  Final diagnoses:  Torticollis, acute  Neck pain  Thyroid nodule     MEDICATIONS GIVEN DURING THIS VISIT:  Medications  sodium chloride 0.9 % bolus 500 mL (0 mLs Intravenous Stopped 10/07/16 2322)  methocarbamol (ROBAXIN) tablet 500 mg (500 mg Oral Given 10/07/16 2204)  iopamidol (ISOVUE-300) 61 % injection (75 mLs  Contrast Given 10/07/16 2222)  methocarbamol (ROBAXIN) tablet 500 mg (500 mg Oral Given 10/07/16 2322)     NEW OUTPATIENT MEDICATIONS STARTED DURING THIS VISIT:  Discharge  Medication List as of 10/07/2016 11:20 PM    START taking these medications   Details  methocarbamol (ROBAXIN) 500 MG tablet Take 1 tablet (500 mg total) by mouth 2 (two) times daily., Starting Sun 10/07/2016, Print        Note:  This document was prepared using Dragon voice recognition software and may include unintentional dictation errors.  Nanda Quinton, MD Emergency Medicine    Arlisha Patalano, [facility], MD 10/08/16 [facility]ing

## 2016-10-07 NOTE — ED Provider Notes (Addendum)
[place]    CSN: [id number] Arrival date & time: 10/07/16  1505     History   Chief Complaint Chief Complaint  Patient presents with  . Neck Pain  . Headache    HPI Leah Norton is a 61 y.o. female.   61 yo female c/o stiff neck x5 days. She denies injury, strenuous exercise, fever, photophobia, N/V/D. Admits to headache. Also admits to tension in her shoulders, but also admits to throat pain. It hurts to tilt or turn her head in all directions. Pain had been midline cervical but is now lateral as well as pharyngeal.       Past Medical History:  Diagnosis Date  . Anginal pain ([place])   . Arthritis    "right anikle" (02/29/2016)  . CAD in native artery 02/08/2016   Three-vessel coronary artery calcification on CT scan  . Complication of anesthesia    "had reaction w/hives, rash, itching" (02/29/2016)  . Constipation   . Coronary artery disease    12/17 PCI with DESx1 to pLAD EF normal  . Enlarged heart   . GERD (gastroesophageal reflux disease)   . High cholesterol   . Hypertension   . Migraine    hx  . Symptomatic anemia 07/10/2016  . Type II diabetes mellitus [facility])     Patient Active Problem List   Diagnosis Date Noted  . GI bleed 07/10/2016  . Morbid obesity due to excess calories ([place]) 05/14/2016  . Dyspnea on exertion 04/29/2016  . Abnormal nuclear stress test   . CAD S/P percutaneous coronary angioplasty 02/08/2016  . INSOMNIA, CHRONIC 02/28/2010  . HEADACHE 02/28/2010  . HEEL PAIN, RIGHT 11/29/2009  . COUGH DUE TO ACE INHIBITORS 11/29/2009  . ATRIAL ENLARGEMENT, LEFT 03/21/2009  . ANEMIA 12/28/2008  . CARDIAC MURMUR 12/28/2008  . OTHER NONSPECIFIC ABNORMAL SERUM ENZYME LEVELS 12/28/2008  . CERVICAL MUSCLE STRAIN 12/28/2008  . CONSTIPATION 06/01/2008  . POSTMENOPAUSAL STATUS 05/15/2008  . BRONCHITIS, CHRONIC 12/18/2007  . Cigarette smoker 08/19/2007  . DEGENERATIVE DISC DISEASE, LUMBOSACRAL SPINE 08/19/2007  . LOW BACK PAIN  SYNDROME 08/19/2007  . UPPER RESPIRATORY INFECTION, VIRAL 03/05/2007  . NEOPLASM, SKIN, UNCERTAIN BEHAVIOR 01/12/1593  . GANGLION CYST, WRIST, LEFT 01/28/2007  . DENTAL PAIN 01/19/2007  . DM (diabetes mellitus), type 2, uncontrolled with complications ([place]) 58/59/2924  . Hyperlipidemia 12/10/2006  . Essential hypertension 12/10/2006  . RHINITIS, ALLERGIC NEC 12/10/2006  . HIP PAIN, RIGHT 12/10/2006    Past Surgical History:  Procedure Laterality Date  . ABDOMINAL HYSTERECTOMY    . CARDIAC CATHETERIZATION N/A 02/29/2016   Procedure: Left Heart Cath and Coronary Angiography;  Surgeon: Belva Crome, MD;  Location: [place] CV LAB;  Service: Cardiovascular;  Laterality: N/A;  . CARDIAC CATHETERIZATION N/A 02/29/2016   Procedure: Coronary Stent Intervention;  Surgeon: Belva Crome, MD;  Location: [place] CV LAB;  Service: Cardiovascular;  Laterality: N/A;  . CARDIAC CATHETERIZATION  2005   "no blockages"  . COLONOSCOPY WITH PROPOFOL N/A 03/15/2014   Procedure: COLONOSCOPY WITH PROPOFOL;  Surgeon: Garlan Fair, MD;  [facility] ENDOSCOPY;  Service: Endoscopy;  Laterality: N/A;  . CORONARY ANGIOPLASTY WITH STENT PLACEMENT    . ESOPHAGOGASTRODUODENOSCOPY N/A 07/11/2016   Procedure: ESOPHAGOGASTRODUODENOSCOPY (EGD);  Surgeon: Laurence Spates, MD;  [facility] ENDOSCOPY;  Service: Endoscopy;  Laterality: N/A;  . FRACTURE SURGERY    . MULTIPLE TOOTH EXTRACTIONS     teeth removed  . ORIF ANKLE FRACTURE Right 11/12/2012   Procedure: OPEN  REDUCTION INTERNAL FIXATION (ORIF) ANKLE FRACTURE;  Surgeon: Marianna Payment, MD;  Location: [place];  Service: Orthopedics;  Laterality: Right;  Open reduction internal fixation ankle fracture, trimalleolar    OB History    No data available       Home Medications    Prior to Admission medications   Medication Sig Start Date End Date Taking? Authorizing Provider  atenolol (TENORMIN) 50 MG tablet Take 50 mg by mouth daily.    Yes [provider]  atorvastatin (LIPITOR) 80 MG tablet Take 1 tablet (80 mg total) by mouth daily at 6 PM. 03/01/16  Yes Cheryln Manly, NP  buPROPion [facility] SR) 150 MG 12 hr tablet Take 150 mg by mouth daily.    Yes [provider]  Cholecalciferol (VITAMIN D) 2000 units CAPS Take 2,000 Units by mouth daily.   Yes [provider]  clopidogrel (PLAVIX) 75 MG tablet Take 75 mg by mouth daily. 07/09/16  Yes [provider]  Cod Liver Oil CAPS Take 1 capsule by mouth See admin instructions. One to two times a day   Yes [provider]  hydrochlorothiazide (HYDRODIURIL) 25 MG tablet Take 25 mg by mouth every morning.    Yes [provider]  losartan (COZAAR) 100 MG tablet Take 1 tablet (100 mg total) by mouth every morning. 03/01/16  Yes Reino Bellis B, NP  pantoprazole (PROTONIX) 40 MG tablet Take 1 tablet (40 mg total) by mouth daily. 03/09/16  Yes Isaiah Serge, NP  sitaGLIPtin-metformin (JANUMET) 50-1000 MG per tablet Take 1 tablet by mouth 2 (two) times daily with a meal.   Yes [provider]  albuterol (PROVENTIL HFA;VENTOLIN HFA) 108 (90 BASE) MCG/ACT inhaler Inhale 1-2 puffs into the lungs every 6 (six) hours as needed for wheezing. 12/08/11   Fransico Meadow, PA-C  nitroGLYCERIN (NITROSTAT) 0.4 MG SL tablet Place 0.4 mg under the tongue every 5 (five) minutes as needed for chest pain (Call 911 at 3rd dose within 15 minutes.).    [provider]  predniSONE (DELTASONE) 10 MG tablet Take 2 tablets (20 mg total) by mouth 2 (two) times daily with a meal. 07/13/16   Veryl Speak, MD    Family History Family History  Problem Relation Age of Onset  . Hypertension Mother     Social History Social History  Substance Use Topics  . Smoking status: Current Every Day Smoker    Packs/day: 0.50    Years: 40.00    Types: Cigarettes  . Smokeless tobacco: Never Used     Comment: down to 2-5 cigarettes daily 05/14/2016  . Alcohol  use Yes     Comment: occasionally     Allergies   Codeine; Erythromycin; Penicillins; Sulfonamide derivatives; and Vicodin [hydrocodone-acetaminophen]   Review of Systems Review of Systems  Constitutional: Negative for chills and fever.  HENT: Positive for sore throat. Negative for congestion and tinnitus.   Eyes: Negative for photophobia and redness.  Respiratory: Negative for cough and shortness of breath.   Cardiovascular: Negative for chest pain and palpitations.  Gastrointestinal: Negative for abdominal pain, diarrhea, nausea and vomiting.  Genitourinary: Negative for dysuria, frequency and urgency.  Musculoskeletal: Negative for myalgias.  Skin: Negative for rash.       No lesions  Neurological: Negative for weakness.  Hematological: Does not bruise/bleed easily.  Psychiatric/Behavioral: Negative for suicidal ideas.     Physical Exam Triage Vital Signs ED Triage Vitals  Enc Vitals Group  BP 10/07/16 1536 (!) 157/76     Pulse Rate 10/07/16 1536 78     Resp 10/07/16 1536 16     Temp 10/07/16 1535 99.3 F (37.4 C)     Temp Source 10/07/16 1535 Oral     SpO2 10/07/16 1536 99 %     Weight --      Height --      Head Circumference --      Peak Flow --      Pain Score 10/07/16 1537 9     Pain Loc --      Pain Edu? --      Excl. in [place]? --    No data found.   Updated Vital Signs BP (!) 157/76   Pulse 78   Temp 99.3 F (37.4 C) (Oral)   Resp 16   SpO2 99%   Visual Acuity Right Eye Distance:   Left Eye Distance:   Bilateral Distance:    Right Eye Near:   Left Eye Near:    Bilateral Near:     Physical Exam  Constitutional: She appears well-developed and well-nourished. No distress.  HENT:  Head: Normocephalic and atraumatic.  Posterior oropharynx not well visualized  Eyes: Conjunctivae are normal.  Neck: Spinous process tenderness and muscular tenderness present. Neck rigidity present. Decreased range of motion present. No edema and no erythema  present. No thyroid mass and no thyromegaly present.  Non-tender thyroid  Cardiovascular: Normal rate and regular rhythm.   No murmur heard. Pulmonary/Chest: Effort normal and breath sounds normal. No respiratory distress.  Abdominal: Soft. There is no tenderness.  Musculoskeletal: She exhibits no edema.  Lymphadenopathy:    She has no cervical adenopathy.  Neurological: She is alert.  Skin: Skin is warm and dry.  Psychiatric: She has a normal mood and affect.  Nursing note and vitals reviewed.    UC Treatments / Results  Labs (all labs ordered are listed, but only abnormal results are displayed) Labs Reviewed  CBC WITH DIFFERENTIAL/PLATELET - Abnormal; Notable for the following:       Result Value   RBC 3.47 (*)    Hemoglobin 9.8 (*)    HCT 32.1 (*)    RDW 16.6 (*)    Platelets 406 (*)    Neutro Abs 7.8 (*)    All other components within normal limits  BASIC METABOLIC PANEL    EKG  EKG Interpretation None       Radiology No results found.  Procedures Procedures (including critical care time)  Medications Ordered in UC Medications - No data to display   Initial Impression / Assessment and Plan / UC Course  I have reviewed the triage vital signs and the nursing notes.  Pertinent labs & imaging results that were available during my care of the patient were reviewed by me and considered in my medical decision making (see chart for details).     Ddx includes meningitis although many clinical features not present; also torticolis (unlikely multidirectional); musculoskeletal. The latter is probable as the patient has bilateral muscle tension and pain in the trapezius muscles, however does not explain pharyngeal pain. Cannot fully visualize throat. Will send patient to ED for imaging  Final Clinical Impressions(s) / UC Diagnoses   Final diagnoses:  Neck pain    New Prescriptions Discharge Medication List as of 10/07/2016  6:49 PM       Harrie Foreman,  MD 10/07/16 1850    Harrie Foreman, MD 10/07/16 1850

## 2016-10-07 NOTE — ED Notes (Signed)
Bed: UC07 Expected date:  Expected time:  Means of arrival:  Comments: 

## 2016-10-07 NOTE — ED Triage Notes (Signed)
Reports "stiff neck" radiating up into HA.  Has tried heat, ice, muscle rub, Motrin without relief.

## 2016-10-07 NOTE — ED Triage Notes (Signed)
MD Tegeler aware of patient's arrival and previous UCC visit/plan of care.

## 2016-10-07 NOTE — ED Triage Notes (Signed)
Denies injury.  Pt with very guarded head movements.  Denies vision changes or weakness.

## 2016-10-22 ENCOUNTER — Telehealth: Payer: Self-pay | Admitting: Interventional Cardiology

## 2016-10-22 NOTE — Telephone Encounter (Signed)
Left message to call back  

## 2016-10-22 NOTE — Telephone Encounter (Signed)
New message: ° ° ° °Pt is returning call. °

## 2016-10-22 NOTE — Telephone Encounter (Signed)
Follow up ° ° ° ° ° °Returning a call to the nurse °

## 2016-10-22 NOTE — Telephone Encounter (Signed)
Patient calling states that she is almost out of her plavix medication and that it was previously discussed that Dr. Tamala Julian may take her off the medication. Patient would like to know if she should have medication refilled or if she can discontinue medication.

## 2016-10-23 MED ORDER — CLOPIDOGREL BISULFATE 75 MG PO TABS: 75.0000 mg | ORAL_TABLET | Freq: Every day | ORAL | 1 refills | Status: DC

## 2016-10-23 NOTE — Telephone Encounter (Signed)
Spoke with pt and advised her per Dr. Thompson Caul last office note, as long as she was doing ok on the Plavix and no reoccurrence of anemia, he would like for her to stay on it for the full 12 months.  Pt verbalized understanding and was in agreement with this plan.  Prescription sent to pharmacy.

## 2016-12-27 ENCOUNTER — Other Ambulatory Visit: Payer: Self-pay | Admitting: Cardiology

## 2017-02-15 ENCOUNTER — Other Ambulatory Visit: Payer: Self-pay | Admitting: Internal Medicine

## 2017-02-15 DIAGNOSIS — R911 Solitary pulmonary nodule: Secondary | ICD-10-CM

## 2017-02-22 ENCOUNTER — Other Ambulatory Visit: Payer: BC Managed Care – PPO

## 2017-03-01 ENCOUNTER — Other Ambulatory Visit: Payer: BC Managed Care – PPO

## 2017-03-15 ENCOUNTER — Other Ambulatory Visit: Payer: Self-pay | Admitting: Interventional Cardiology

## 2017-03-15 MED ORDER — PANTOPRAZOLE SODIUM 40 MG PO TBEC: 40.0000 mg | DELAYED_RELEASE_TABLET | Freq: Every day | ORAL | 5 refills | Status: DC

## 2017-03-21 ENCOUNTER — Telehealth: Payer: Self-pay | Admitting: Interventional Cardiology

## 2017-03-21 NOTE — Telephone Encounter (Signed)
Spoke with pt and scheduled her to see Dr. Tamala Julian 2/4.  Pt appreciative for call.

## 2017-03-21 NOTE — Telephone Encounter (Signed)
New Message  Pt call requesting to speak with RN to see when she would need to schedule a f/u up with Dr. Tamala Julian

## 2017-04-14 NOTE — Progress Notes (Signed)
Cardiology Office Note    Date:  04/15/2017   ID:  Leah Norton, DOB [birth year], MRN [id number]  PCP:  Leah Carol, MD  Cardiologist: Leah Grooms, MD   Chief Complaint  Patient presents with  . Follow-up    Swelling medial aspect of the left patella.  . Coronary Artery Disease    History of Present Illness:  Leah Norton is a 62 y.o. female with history of diabetes, hyper-lipidemia, hypertension, tobacco abuse, nonobstructive coronary disease by angiography in 2005. Recent nuclear study demonstrated evidence of ischemia. Subsequent catheterization demonstrated 75-85% proximal LAD which was stented with DES. Brilinta caused dyspnea and has been discontinued with substitution of Plavix   Multiple complaints, none of which seem to be cardiac.  She denies chest pain.  No dyspnea, palpitations, syncope, or other complaints.   Past Medical History:  Diagnosis Date  . Anginal pain ([place])   . Arthritis    "right anikle" (02/29/2016)  . CAD in native artery 02/08/2016   Three-vessel coronary artery calcification on CT scan  . Complication of anesthesia    "had reaction w/hives, rash, itching" (02/29/2016)  . Constipation   . Coronary artery disease    12/17 PCI with DESx1 to pLAD EF normal  . Enlarged heart   . GERD (gastroesophageal reflux disease)   . High cholesterol   . Hypertension   . Migraine    hx  . Symptomatic anemia 07/10/2016  . Type II diabetes mellitus ([place])     Past Surgical History:  Procedure Laterality Date  . ABDOMINAL HYSTERECTOMY    . CARDIAC CATHETERIZATION N/A 02/29/2016   Procedure: Left Heart Cath and Coronary Angiography;  Surgeon: Leah Crome, MD;  Location: [place] [facility];  Service: Cardiovascular;  Laterality: N/A;  . CARDIAC CATHETERIZATION N/A 02/29/2016   Procedure: Coronary Stent Intervention;  Surgeon: Leah Crome, MD;  Location: [place] [facility];  Service: Cardiovascular;  Laterality: N/A;  . CARDIAC  CATHETERIZATION  2005   "no blockages"  . COLONOSCOPY WITH PROPOFOL N/A 03/15/2014   Procedure: COLONOSCOPY WITH PROPOFOL;  Surgeon: Leah Fair, MD;  Location: [facility];  Service: Endoscopy;  Laterality: N/A;  . CORONARY ANGIOPLASTY WITH STENT PLACEMENT    . ESOPHAGOGASTRODUODENOSCOPY N/A 07/11/2016   Procedure: ESOPHAGOGASTRODUODENOSCOPY (EGD);  Surgeon: Leah Spates, MD;  Location: [facility];  Service: Endoscopy;  Laterality: N/A;  . FRACTURE SURGERY    . MULTIPLE TOOTH EXTRACTIONS     teeth removed  . ORIF ANKLE FRACTURE Right 11/12/2012   Procedure: OPEN REDUCTION INTERNAL FIXATION (ORIF) ANKLE FRACTURE;  Surgeon: Leah Payment, MD;  Location: [facility];  Service: Orthopedics;  Laterality: Right;  Open reduction internal fixation ankle fracture, trimalleolar    Current Medications: Outpatient Medications Prior to Visit  Medication Sig Dispense Refill  . aspirin EC 81 MG tablet Take 81 mg by mouth daily.    Marland Kitchen atenolol (TENORMIN) 50 MG tablet Take 50 mg by mouth daily.     Marland Kitchen buPROPion (WELLBUTRIN SR) 150 MG 12 hr tablet Take 150 mg by mouth 2 (two) times daily.     . Cholecalciferol (VITAMIN D3) 1000 units CAPS Take 2,000 Units by mouth daily.    Marland Kitchen Cod Liver Oil CAPS Take 1 capsule by mouth See admin instructions. One to two times a day    . hydrochlorothiazide (HYDRODIURIL) 25 MG tablet Take 25 mg by mouth every morning.     Marland Kitchen losartan (COZAAR) 100 MG tablet TAKE  ONE TABLET BY MOUTH IN THE MORNING 90 tablet 3  . nitroGLYCERIN (NITROSTAT) 0.4 MG SL tablet Place 0.4 mg under the tongue every 5 (five) minutes as needed for chest pain (Call 911 at 3rd dose within 15 minutes.).    Marland Kitchen pantoprazole (PROTONIX) 40 MG tablet Take 1 tablet (40 mg total) by mouth daily. 30 tablet 5  . rosuvastatin (CRESTOR) 10 MG tablet Take 10 mg by mouth daily.    . sitaGLIPtin-metformin (JANUMET) 50-1000 MG per tablet Take 1 tablet by mouth 2 (two) times daily with a meal.    . clopidogrel (PLAVIX) 75  MG tablet Take 1 tablet (75 mg total) by mouth daily. 90 tablet 1  . albuterol (PROVENTIL HFA;VENTOLIN HFA) 108 (90 BASE) MCG/ACT inhaler Inhale 1-2 puffs into the lungs every 6 (six) hours as needed for wheezing. (Patient not taking: Reported on 04/15/2017) 1 Inhaler 0  . atorvastatin (LIPITOR) 80 MG tablet Take 1 tablet (80 mg total) by mouth daily at 6 PM. (Patient not taking: Reported on 04/15/2017) 30 tablet 6  . Cholecalciferol (VITAMIN D) 2000 units CAPS Take 2,000 Units by mouth daily.    . methocarbamol (ROBAXIN) 500 MG tablet Take 1 tablet (500 mg total) by mouth 2 (two) times daily. (Patient not taking: Reported on 04/15/2017) 20 tablet 0  . predniSONE (DELTASONE) 10 MG tablet Take 2 tablets (20 mg total) by mouth 2 (two) times daily with a meal. (Patient not taking: Reported on 04/15/2017) 12 tablet 0   No facility-administered medications prior to visit.      Allergies:   Codeine; Erythromycin; Penicillins; Sulfonamide derivatives; and Vicodin [hydrocodone-acetaminophen]   Social History   Socioeconomic History  . Marital status: Single    Spouse name: None  . Number of children: None  . Years of education: None  . Highest education level: None  Social Needs  . Financial resource strain: None  . Food insecurity - worry: None  . Food insecurity - inability: None  . Transportation needs - medical: None  . Transportation needs - non-medical: None  Occupational History  . None  Tobacco Use  . Smoking status: Current Every Day Smoker    Packs/day: 0.50    Years: 40.00    Pack years: 20.00    Types: Cigarettes  . Smokeless tobacco: Never Used  . Tobacco comment: down to 2-5 cigarettes daily 05/14/2016  Substance and Sexual Activity  . Alcohol use: Yes    Comment: occasionally  . Drug use: No  . Sexual activity: None  Other Topics Concern  . None  Social History Narrative  . None     Family History:  The patient's family history includes Hypertension in her mother.   ROS:    Please see the history of present illness.    Leg pain, facial pain, intermittent left earache, cough, blood in stool, discomfort medial aspect left upper arm All other systems reviewed and are negative.   PHYSICAL EXAM:   VS:  BP 124/70   Pulse 76   Ht 5\' 6"  (1.676 m)   Wt 184 lb 3.2 oz (83.6 kg)   BMI 29.73 kg/m    GEN: Well nourished, well developed, in no acute distress  HEENT: normal  Neck: no JVD, carotid bruits, or masses Cardiac: RRR; no murmurs, rubs, or gallops,no edema  Respiratory:  clear to auscultation bilaterally, normal work of breathing GI: soft, nontender, nondistended, + BS MS: no deformity or atrophy.  Mobile medial aspect left upper arm fullness/mass. Skin:  warm and dry, no rash Neuro:  Alert and Oriented x 3, Strength and sensation are intact Psych: euthymic mood, full affect  Wt Readings from Last 3 Encounters:  04/15/17 184 lb 3.2 oz (83.6 kg)  10/07/16 180 lb (81.6 kg)  09/03/16 185 lb (83.9 kg)      Studies/Labs Reviewed:   EKG:  EKG normal sinus rhythm, poor R wave progression, leftward axis.  Recent Labs: 04/19/2016: Magnesium 1.6 07/10/2016: ALT 14 10/07/2016: BUN 7; Creatinine, Ser 0.61; Hemoglobin 9.8; Platelets 406; Potassium 3.8; Sodium 139   Lipid Panel    Component Value Date/Time   CHOL 130 03/09/2016 1246   TRIG 203 (H) 03/09/2016 1246   HDL 34 (L) 03/09/2016 1246   CHOLHDL 3.8 03/09/2016 1246   VLDL 41 (H) 03/09/2016 1246   LDLCALC 55 03/09/2016 1246    Additional studies/ records that were reviewed today include:  None    ASSESSMENT:    1. CAD S/P percutaneous coronary angioplasty   2. Essential hypertension   3. Hyperlipidemia with target LDL less than 70   4. Left arm pain      PLAN:  In order of problems listed above:  1. Focal proximal LAD stent December 2017.  No recurrent anginal complaints.  Discontinue Plavix.  Continue statin therapy and aspirin. 2. Adequate control as noted above. 3. Most recent LDL  was 139 in November.  Since that time rosuvastatin 10 mg daily has been started.  This is now being followed by Dr. Delfina Redwood. 4. Mobile medial aspect left upper arm left upper medial arm area, possibly representing a lipoma.  Needs follow-up with her primary care.  Likely needs to be imaged..  Will defer to primary care.  Specifically asked her to Dr. Delfina Redwood.  Clinical follow-up in 1 year.  Medication Adjustments/Labs and Tests Ordered: Current medicines are reviewed at length with the patient today.  Concerns regarding medicines are outlined above.  Medication changes, Labs and Tests ordered today are listed in the Patient Instructions below. Patient Instructions  Medication Instructions:  1) DISCONTINUE Plavix  Labwork: None  Testing/Procedures: None  Follow-Up: Your physician wants you to follow-up in: 1 year with Dr. Tamala Julian.  You will receive a reminder letter in the mail two months in advance. If you don't receive a letter, please call our office to schedule the follow-up appointment.   Any Other Special Instructions Will Be Listed Below (If Applicable).     If you need a refill on your cardiac medications before your next appointment, please call your pharmacy.      Signed, Leah Grooms, MD  04/15/2017 12:04 PM    [facility] [address], [place], [place]  [address] Phone: [phone]; Fax: [phone]

## 2017-04-15 ENCOUNTER — Encounter: Payer: Self-pay | Admitting: Interventional Cardiology

## 2017-04-15 ENCOUNTER — Ambulatory Visit (INDEPENDENT_AMBULATORY_CARE_PROVIDER_SITE_OTHER): Payer: BC Managed Care – PPO | Admitting: Interventional Cardiology

## 2017-04-15 VITALS — BP 124/70 | HR 76 | Ht 66.0 in | Wt 184.2 lb

## 2017-04-15 DIAGNOSIS — I10 Essential (primary) hypertension: Secondary | ICD-10-CM

## 2017-04-15 DIAGNOSIS — E785 Hyperlipidemia, unspecified: Secondary | ICD-10-CM | POA: Diagnosis not present

## 2017-04-15 DIAGNOSIS — M79602 Pain in left arm: Secondary | ICD-10-CM

## 2017-04-15 DIAGNOSIS — Z9861 Coronary angioplasty status: Secondary | ICD-10-CM

## 2017-04-15 DIAGNOSIS — I2510 Atherosclerotic heart disease of native coronary artery without angina pectoris: Secondary | ICD-10-CM

## 2017-04-15 NOTE — Patient Instructions (Signed)
Medication Instructions:  1) DISCONTINUE Plavix  Labwork: None  Testing/Procedures: None  Follow-Up: Your physician wants you to follow-up in: 1 year with Dr. Smith.  You will receive a reminder letter in the mail two months in advance. If you don't receive a letter, please call our office to schedule the follow-up appointment.   Any Other Special Instructions Will Be Listed Below (If Applicable).     If you need a refill on your cardiac medications before your next appointment, please call your pharmacy.   

## 2017-05-08 ENCOUNTER — Ambulatory Visit
Admission: RE | Admit: 2017-05-08 | Discharge: 2017-05-08 | Disposition: A | Discharge status: Home / Self Care | Payer: BC Managed Care – PPO | Source: Ambulatory Visit | Attending: Internal Medicine | Admitting: Internal Medicine

## 2017-05-08 ENCOUNTER — Other Ambulatory Visit: Payer: BC Managed Care – PPO

## 2017-05-08 DIAGNOSIS — R911 Solitary pulmonary nodule: Secondary | ICD-10-CM

## 2017-08-12 ENCOUNTER — Other Ambulatory Visit: Payer: Self-pay | Admitting: Interventional Cardiology

## 2017-12-12 ENCOUNTER — Other Ambulatory Visit: Payer: Self-pay | Admitting: Internal Medicine

## 2017-12-12 DIAGNOSIS — R2232 Localized swelling, mass and lump, left upper limb: Secondary | ICD-10-CM

## 2017-12-16 ENCOUNTER — Ambulatory Visit
Admission: RE | Admit: 2017-12-16 | Discharge: 2017-12-16 | Disposition: A | Discharge status: Home / Self Care | Payer: BC Managed Care – PPO | Source: Ambulatory Visit | Attending: Internal Medicine | Admitting: Internal Medicine

## 2017-12-16 DIAGNOSIS — R2232 Localized swelling, mass and lump, left upper limb: Secondary | ICD-10-CM

## 2017-12-19 ENCOUNTER — Other Ambulatory Visit: Payer: Self-pay | Admitting: Internal Medicine

## 2017-12-19 DIAGNOSIS — M7989 Other specified soft tissue disorders: Secondary | ICD-10-CM

## 2017-12-19 DIAGNOSIS — R2230 Localized swelling, mass and lump, unspecified upper limb: Principal | ICD-10-CM

## 2017-12-26 DIAGNOSIS — H9202 Otalgia, left ear: Secondary | ICD-10-CM | POA: Insufficient documentation

## 2018-01-01 ENCOUNTER — Other Ambulatory Visit: Payer: BC Managed Care – PPO

## 2018-01-02 ENCOUNTER — Ambulatory Visit
Admission: RE | Admit: 2018-01-02 | Discharge: 2018-01-02 | Disposition: A | Discharge status: Home / Self Care | Payer: BC Managed Care – PPO | Source: Ambulatory Visit | Attending: Internal Medicine | Admitting: Internal Medicine

## 2018-01-02 DIAGNOSIS — M7989 Other specified soft tissue disorders: Secondary | ICD-10-CM

## 2018-01-02 DIAGNOSIS — R2230 Localized swelling, mass and lump, unspecified upper limb: Principal | ICD-10-CM

## 2018-01-15 ENCOUNTER — Encounter (HOSPITAL_COMMUNITY): Payer: Self-pay | Admitting: Pharmacy Technician

## 2018-01-15 ENCOUNTER — Other Ambulatory Visit: Payer: Self-pay

## 2018-01-15 ENCOUNTER — Inpatient Hospital Stay (HOSPITAL_COMMUNITY)
Admission: EM | Admit: 2018-01-15 | Discharge: 2018-01-18 | DRG: 065 | Disposition: A | Discharge status: Home / Self Care | Payer: BC Managed Care – PPO | Attending: Internal Medicine | Admitting: Internal Medicine

## 2018-01-15 DIAGNOSIS — M479 Spondylosis, unspecified: Secondary | ICD-10-CM | POA: Diagnosis present

## 2018-01-15 DIAGNOSIS — I63511 Cerebral infarction due to unspecified occlusion or stenosis of right middle cerebral artery: Secondary | ICD-10-CM | POA: Diagnosis not present

## 2018-01-15 DIAGNOSIS — R29810 Facial weakness: Secondary | ICD-10-CM | POA: Diagnosis present

## 2018-01-15 DIAGNOSIS — I639 Cerebral infarction, unspecified: Secondary | ICD-10-CM | POA: Diagnosis present

## 2018-01-15 DIAGNOSIS — G8194 Hemiplegia, unspecified affecting left nondominant side: Secondary | ICD-10-CM | POA: Diagnosis present

## 2018-01-15 DIAGNOSIS — I5022 Chronic systolic (congestive) heart failure: Secondary | ICD-10-CM | POA: Diagnosis present

## 2018-01-15 DIAGNOSIS — Z794 Long term (current) use of insulin: Secondary | ICD-10-CM

## 2018-01-15 DIAGNOSIS — Z8249 Family history of ischemic heart disease and other diseases of the circulatory system: Secondary | ICD-10-CM

## 2018-01-15 DIAGNOSIS — Z7902 Long term (current) use of antithrombotics/antiplatelets: Secondary | ICD-10-CM

## 2018-01-15 DIAGNOSIS — I2510 Atherosclerotic heart disease of native coronary artery without angina pectoris: Secondary | ICD-10-CM

## 2018-01-15 DIAGNOSIS — IMO0002 Reserved for concepts with insufficient information to code with codable children: Secondary | ICD-10-CM | POA: Diagnosis present

## 2018-01-15 DIAGNOSIS — Z79899 Other long term (current) drug therapy: Secondary | ICD-10-CM

## 2018-01-15 DIAGNOSIS — Z7982 Long term (current) use of aspirin: Secondary | ICD-10-CM

## 2018-01-15 DIAGNOSIS — Z9861 Coronary angioplasty status: Secondary | ICD-10-CM

## 2018-01-15 DIAGNOSIS — D649 Anemia, unspecified: Secondary | ICD-10-CM | POA: Diagnosis present

## 2018-01-15 DIAGNOSIS — R270 Ataxia, unspecified: Secondary | ICD-10-CM | POA: Diagnosis present

## 2018-01-15 DIAGNOSIS — E118 Type 2 diabetes mellitus with unspecified complications: Secondary | ICD-10-CM

## 2018-01-15 DIAGNOSIS — G43909 Migraine, unspecified, not intractable, without status migrainosus: Secondary | ICD-10-CM | POA: Diagnosis present

## 2018-01-15 DIAGNOSIS — F17210 Nicotine dependence, cigarettes, uncomplicated: Secondary | ICD-10-CM | POA: Diagnosis present

## 2018-01-15 DIAGNOSIS — Z955 Presence of coronary angioplasty implant and graft: Secondary | ICD-10-CM

## 2018-01-15 DIAGNOSIS — I10 Essential (primary) hypertension: Secondary | ICD-10-CM | POA: Diagnosis present

## 2018-01-15 DIAGNOSIS — E1165 Type 2 diabetes mellitus with hyperglycemia: Secondary | ICD-10-CM | POA: Diagnosis present

## 2018-01-15 DIAGNOSIS — I110 Hypertensive heart disease with heart failure: Secondary | ICD-10-CM | POA: Diagnosis present

## 2018-01-15 DIAGNOSIS — E785 Hyperlipidemia, unspecified: Secondary | ICD-10-CM | POA: Diagnosis present

## 2018-01-15 DIAGNOSIS — K219 Gastro-esophageal reflux disease without esophagitis: Secondary | ICD-10-CM | POA: Diagnosis present

## 2018-01-15 NOTE — ED Notes (Signed)
This Patient was Triaged by Valentino Nose, RN. Not Greg EMT!

## 2018-01-15 NOTE — ED Triage Notes (Signed)
Pt from home with c/o of stroke like symptoms that began Monday morning around 0400. Pt with slurred speech, disorganized thinking, and left sided weakness.

## 2018-01-15 NOTE — ED Provider Notes (Signed)
[facility] EMERGENCY DEPARTMENT Provider Note  CSN: [id number] Arrival date & time: 01/15/18 2301  Chief Complaint(s) Stroke Symptoms  HPI Leah Norton is a 62 y.o. female with extensive past medical history including hypertension, hyperlipidemia, diabetes, CAD who presents to the emergency department with strokelike symptoms.  Patient reports recent URI symptoms that began over the weekend.  On Monday morning she reports taking Benadryl for it.  Following this she began feeling off balance.  Thought it was related to side effects from the Benadryl.  The following day around 1900pm, while at work, patient noted slurred speech and left-sided upper and lower extremity weakness.  These have persisted since.  Earlier this evening, her family was over her house and noted that she had facial droop on the left and brought her here for evaluation.  Patient denies any headache, visual disturbance, chest pain, shortness of breath, nausea, vomiting, abddominal pain.  No urinary symptoms.  The history is provided by the patient.      Past Medical History Past Medical History:  Diagnosis Date  . Anginal pain ([place])   . Arthritis    "right anikle" (02/29/2016)  . CAD in native artery 02/08/2016   Three-vessel coronary artery calcification on CT scan  . Complication of anesthesia    "had reaction w/hives, rash, itching" (02/29/2016)  . Constipation   . Coronary artery disease    12/17 PCI with DESx1 to pLAD EF normal  . Enlarged heart   . GERD (gastroesophageal reflux disease)   . High cholesterol   . Hypertension   . Migraine    hx  . Symptomatic anemia 07/10/2016  . Type II diabetes mellitus [facility])    Patient Active Problem List   Diagnosis Date Noted  . GI bleed 07/10/2016  . Morbid obesity due to excess calories ([place]) 05/14/2016  . Dyspnea on exertion 04/29/2016  . Abnormal nuclear stress test   . CAD S/P percutaneous coronary angioplasty 02/08/2016  .  INSOMNIA, CHRONIC 02/28/2010  . HEADACHE 02/28/2010  . HEEL PAIN, RIGHT 11/29/2009  . COUGH DUE TO ACE INHIBITORS 11/29/2009  . ATRIAL ENLARGEMENT, LEFT 03/21/2009  . ANEMIA 12/28/2008  . CARDIAC MURMUR 12/28/2008  . OTHER NONSPECIFIC ABNORMAL SERUM ENZYME LEVELS 12/28/2008  . CERVICAL MUSCLE STRAIN 12/28/2008  . CONSTIPATION 06/01/2008  . POSTMENOPAUSAL STATUS 05/15/2008  . BRONCHITIS, CHRONIC 12/18/2007  . Cigarette smoker 08/19/2007  . DEGENERATIVE DISC DISEASE, LUMBOSACRAL SPINE 08/19/2007  . LOW BACK PAIN SYNDROME 08/19/2007  . UPPER RESPIRATORY INFECTION, VIRAL 03/05/2007  . NEOPLASM, SKIN, UNCERTAIN BEHAVIOR 45/80/9983  . GANGLION CYST, WRIST, LEFT 01/28/2007  . DENTAL PAIN 01/19/2007  . DM (diabetes mellitus), type 2, uncontrolled with complications ([place]) 38/25/0539  . Hyperlipidemia with target LDL less than 70 12/10/2006  . Essential hypertension 12/10/2006  . RHINITIS, ALLERGIC NEC 12/10/2006  . HIP PAIN, RIGHT 12/10/2006   Home Medication(s) Prior to Admission medications   Medication Sig Start Date End Date Taking? Authorizing Provider  aspirin EC 81 MG tablet Take 81 mg by mouth daily.   Yes [provider]  atenolol (TENORMIN) 50 MG tablet Take 50 mg by mouth daily.    Yes [provider]  buPROPion (WELLBUTRIN SR) 150 MG 12 hr tablet Take 150 mg by mouth 2 (two) times daily.    Yes [provider]  Cholecalciferol (VITAMIN D3) 1000 units CAPS Take 2,000 Units by mouth daily.   Yes [provider]  Cod Liver Oil CAPS Take 1 capsule by mouth  once a week. One to two times a day    Yes [provider]  hydrochlorothiazide (HYDRODIURIL) 25 MG tablet Take 25 mg by mouth every morning.    Yes [provider]  losartan (COZAAR) 100 MG tablet TAKE ONE TABLET BY MOUTH IN THE MORNING Patient taking differently: Take 100 mg by mouth daily.  12/28/16  Yes Belva Crome, MD  nitroGLYCERIN (NITROSTAT) 0.4 MG SL tablet Place  0.4 mg under the tongue every 5 (five) minutes as needed for chest pain (Call 911 at 3rd dose within 15 minutes.).   Yes [provider]  pantoprazole (PROTONIX) 40 MG tablet TAKE 1 TABLET BY MOUTH ONCE DAILY Patient taking differently: Take 40 mg by mouth daily.  08/12/17  Yes Belva Crome, MD  rosuvastatin (CRESTOR) 10 MG tablet Take 10 mg by mouth daily.   Yes [provider]  sitaGLIPtin-metformin (JANUMET) 50-1000 MG per tablet Take 1 tablet by mouth 2 (two) times daily with a meal.   Yes [provider]                                                                                                                                    Past Surgical History Past Surgical History:  Procedure Laterality Date  . ABDOMINAL HYSTERECTOMY    . CARDIAC CATHETERIZATION N/A 02/29/2016   Procedure: Left Heart Cath and Coronary Angiography;  Surgeon: Belva Crome, MD;  Location: [facility];  Service: Cardiovascular;  Laterality: N/A;  . CARDIAC CATHETERIZATION N/A 02/29/2016   Procedure: Coronary Stent Intervention;  Surgeon: Belva Crome, MD;  Location: [place] [facility];  Service: Cardiovascular;  Laterality: N/A;  . CARDIAC CATHETERIZATION  2005   "no blockages"  . COLONOSCOPY WITH PROPOFOL N/A 03/15/2014   Procedure: COLONOSCOPY WITH PROPOFOL;  Surgeon: Garlan Fair, MD;  [facility];  Service: Endoscopy;  Laterality: N/A;  . CORONARY ANGIOPLASTY WITH STENT PLACEMENT    . ESOPHAGOGASTRODUODENOSCOPY N/A 07/11/2016   Procedure: ESOPHAGOGASTRODUODENOSCOPY (EGD);  Surgeon: Laurence Spates, MD;  Location: [facility];  Service: Endoscopy;  Laterality: N/A;  . FRACTURE SURGERY    . MULTIPLE TOOTH EXTRACTIONS     teeth removed  . ORIF ANKLE FRACTURE Right 11/12/2012   Procedure: OPEN REDUCTION INTERNAL FIXATION (ORIF) ANKLE FRACTURE;  Surgeon: Marianna Payment, MD;  Location: [facility];  Service: Orthopedics;  Laterality: Right;  Open reduction internal  fixation ankle fracture, trimalleolar   Family History Family History  Problem Relation Age of Onset  . Hypertension Mother     Social History Social History   Tobacco Use  . Smoking status: Current Every Day Smoker    Packs/day: 0.50    Years: 40.00    Pack years: 20.00    Types: Cigarettes  . Smokeless tobacco: Never Used  . Tobacco comment: down to 2-5 cigarettes daily 05/14/2016  Substance Use Topics  . Alcohol use:  Yes    Comment: occasionally  . Drug use: No   Allergies Codeine; Erythromycin; Penicillins; Sulfonamide derivatives; and Vicodin [hydrocodone-acetaminophen]  Review of Systems Review of Systems All other systems are reviewed and are negative for acute change except as noted in the HPI  Physical Exam Vital Signs  I have reviewed the triage vital signs BP (!) 143/71   Pulse 72   Temp 98.5 F (36.9 C) (Oral)   Resp 14   Ht 5\' 6"  (1.676 m)   Wt 81.6 kg   SpO2 95%   BMI 29.05 kg/m   Physical Exam  Constitutional: She is oriented to person, place, and time. She appears well-developed and well-nourished. No distress.  HENT:  Head: Normocephalic and atraumatic.  Right Ear: External ear normal.  Left Ear: External ear normal.  Nose: Nose normal.  Eyes: Conjunctivae and EOM are normal. No scleral icterus.  Neck: Normal range of motion and phonation normal.  Cardiovascular: Normal rate and regular rhythm.  Pulmonary/Chest: Effort normal. No stridor. No respiratory distress.  Abdominal: She exhibits no distension.  Musculoskeletal: Normal range of motion. She exhibits no edema.  Neurological: She is alert and oriented to person, place, and time.  Mental Status:  Alert and oriented to person, place, and time.  Attention and concentration normal.  Speech slurred.  Recent memory is intact  Cranial Nerves:  II Visual Fields: Intact to confrontation. Visual fields intact. III, IV, VI: Pupils equal and reactive to light and near. Full eye movement  without nystagmus  V Facial Sensation: Normal. No weakness of masticatory muscles  VII: Left facial droop with forehead sparing VIII Auditory Acuity: Grossly normal  IX/X: The uvula is midline; the palate elevates symmetrically  XI: Normal sternocleidomastoid and trapezius strength  XII: The tongue is midline. No atrophy or fasciculations.   Motor System: Muscle Strength: 3+/5 in the left upper and lower extremities. 5/5 in the right upper and lower extremities.   Muscle Tone: Tone and muscle bulk are normal in the upper and lower extremities.   Reflexes: DTRs: 1+ and symmetrical in all four extremities. No Clonus Coordination: dysmetric finger to nose on left Sensation: Intact to light touch, Gait: deferred   Skin: She is not diaphoretic.  Psychiatric: She has a normal mood and affect. Her behavior is normal.  Vitals reviewed.   ED Results and Treatments Labs (all labs ordered are listed, but only abnormal results are displayed) Labs Reviewed  CBC - Abnormal; Notable for the following components:      Result Value   Hemoglobin 11.4 (*)    MCHC 29.7 (*)    Platelets 402 (*)    All other components within normal limits  COMPREHENSIVE METABOLIC PANEL - Abnormal; Notable for the following components:   Glucose, Bld 105 (*)    Calcium 10.4 (*)    All other components within normal limits  I-STAT CHEM 8, ED - Abnormal; Notable for the following components:   Glucose, Bld 104 (*)    All other components within normal limits  ETHANOL  PROTIME-INR  APTT  DIFFERENTIAL  RAPID URINE DRUG SCREEN, HOSP PERFORMED  URINALYSIS, ROUTINE W REFLEX MICROSCOPIC  I-STAT TROPONIN, ED  EKG  EKG Interpretation  Date/Time:  Wednesday January 15 2018 23:13:48 EST Ventricular Rate:  70 PR Interval:    QRS Duration: 94 QT Interval:  412 QTC Calculation: 445 R  Axis:   -31 Text Interpretation:  Sinus rhythm Left axis deviation No significant change since last tracing Confirmed by Addison Lank [phone]) on 01/16/2018 12:38:05 AM      Radiology Mr Brain Wo Contrast  Result Date: 01/16/2018 CLINICAL DATA:  Left-sided weakness, slurred speech and altered mental status EXAM: MRI HEAD WITHOUT CONTRAST TECHNIQUE: Multiplanar, multiecho pulse sequences of the brain and surrounding structures were obtained without intravenous contrast. COMPARISON:  None. FINDINGS: BRAIN: Acute infarct within the right pons measures 7 mm. The midline structures are normal. No midline shift or other mass effect. There are no old infarcts. Multifocal white matter hyperintensity, most commonly due to chronic ischemic microangiopathy. The cerebral and cerebellar volume are age-appropriate. Susceptibility-sensitive sequences show no chronic microhemorrhage or superficial siderosis. VASCULAR: Major intracranial arterial and venous sinus flow voids are normal. SKULL AND UPPER CERVICAL SPINE: Calvarial bone marrow signal is normal. There is no skull base mass. Visualized upper cervical spine and soft tissues are normal. SINUSES/ORBITS: No fluid levels or advanced mucosal thickening. No mastoid or middle ear effusion. The orbits are normal. IMPRESSION: 1. Subcentimeter acute or early subacute infarct within the right pons, in keeping with reported left-sided weakness. 2. No acute hemorrhage, mass effect or hydrocephalus. Electronically Signed   By: Ulyses Jarred M.D.   On: 01/16/2018 01:48   Pertinent labs & imaging results that were available during my care of the patient were reviewed by me and considered in my medical decision making (see chart for details).  Medications Ordered in ED Medications  diazepam (VALIUM) injection 2.5 mg (has no administration in time range)                                                                                                                                     Procedures Procedures  (including critical care time)  Medical Decision Making / ED Course I have reviewed the nursing notes for this encounter and the patient's prior records (if available in EHR or on provided paperwork).    Presentation suspicious for CVA.  Patient out of the window for TPA or endovascular intervention.  Stroke work-up initiated.  MRI confirmed right pontine stroke.  Neurology consulted.  Will admit to medicine for stroke management.  Final Clinical Impression(s) / ED Diagnoses Final diagnoses:  Acute ischemic stroke [facility])      This chart was dictated using voice recognition software.  Despite best efforts to proofread,  errors can occur which can change the documentation meaning.   Fatima Blank, MD 01/16/18 [phone]

## 2018-01-16 ENCOUNTER — Observation Stay (HOSPITAL_COMMUNITY): Payer: BC Managed Care – PPO

## 2018-01-16 ENCOUNTER — Emergency Department (HOSPITAL_COMMUNITY): Payer: BC Managed Care – PPO

## 2018-01-16 ENCOUNTER — Encounter (HOSPITAL_COMMUNITY): Payer: Self-pay | Admitting: Family Medicine

## 2018-01-16 DIAGNOSIS — I5030 Unspecified diastolic (congestive) heart failure: Secondary | ICD-10-CM

## 2018-01-16 DIAGNOSIS — G43909 Migraine, unspecified, not intractable, without status migrainosus: Secondary | ICD-10-CM | POA: Diagnosis present

## 2018-01-16 DIAGNOSIS — I10 Essential (primary) hypertension: Secondary | ICD-10-CM

## 2018-01-16 DIAGNOSIS — Z7982 Long term (current) use of aspirin: Secondary | ICD-10-CM | POA: Diagnosis not present

## 2018-01-16 DIAGNOSIS — Z794 Long term (current) use of insulin: Secondary | ICD-10-CM | POA: Diagnosis not present

## 2018-01-16 DIAGNOSIS — Z7902 Long term (current) use of antithrombotics/antiplatelets: Secondary | ICD-10-CM | POA: Diagnosis not present

## 2018-01-16 DIAGNOSIS — E118 Type 2 diabetes mellitus with unspecified complications: Secondary | ICD-10-CM | POA: Diagnosis not present

## 2018-01-16 DIAGNOSIS — K219 Gastro-esophageal reflux disease without esophagitis: Secondary | ICD-10-CM | POA: Diagnosis present

## 2018-01-16 DIAGNOSIS — E1165 Type 2 diabetes mellitus with hyperglycemia: Secondary | ICD-10-CM | POA: Diagnosis present

## 2018-01-16 DIAGNOSIS — Z8249 Family history of ischemic heart disease and other diseases of the circulatory system: Secondary | ICD-10-CM | POA: Diagnosis not present

## 2018-01-16 DIAGNOSIS — I2510 Atherosclerotic heart disease of native coronary artery without angina pectoris: Secondary | ICD-10-CM

## 2018-01-16 DIAGNOSIS — G8194 Hemiplegia, unspecified affecting left nondominant side: Secondary | ICD-10-CM | POA: Diagnosis present

## 2018-01-16 DIAGNOSIS — R29810 Facial weakness: Secondary | ICD-10-CM | POA: Diagnosis present

## 2018-01-16 DIAGNOSIS — Z9861 Coronary angioplasty status: Secondary | ICD-10-CM

## 2018-01-16 DIAGNOSIS — F17210 Nicotine dependence, cigarettes, uncomplicated: Secondary | ICD-10-CM | POA: Diagnosis not present

## 2018-01-16 DIAGNOSIS — Z79899 Other long term (current) drug therapy: Secondary | ICD-10-CM | POA: Diagnosis not present

## 2018-01-16 DIAGNOSIS — I110 Hypertensive heart disease with heart failure: Secondary | ICD-10-CM | POA: Diagnosis present

## 2018-01-16 DIAGNOSIS — I63511 Cerebral infarction due to unspecified occlusion or stenosis of right middle cerebral artery: Secondary | ICD-10-CM | POA: Diagnosis present

## 2018-01-16 DIAGNOSIS — I639 Cerebral infarction, unspecified: Secondary | ICD-10-CM | POA: Diagnosis not present

## 2018-01-16 DIAGNOSIS — M479 Spondylosis, unspecified: Secondary | ICD-10-CM | POA: Diagnosis present

## 2018-01-16 DIAGNOSIS — I519 Heart disease, unspecified: Secondary | ICD-10-CM | POA: Diagnosis not present

## 2018-01-16 DIAGNOSIS — R270 Ataxia, unspecified: Secondary | ICD-10-CM | POA: Diagnosis present

## 2018-01-16 DIAGNOSIS — Z955 Presence of coronary angioplasty implant and graft: Secondary | ICD-10-CM | POA: Diagnosis not present

## 2018-01-16 DIAGNOSIS — E785 Hyperlipidemia, unspecified: Secondary | ICD-10-CM | POA: Diagnosis present

## 2018-01-16 DIAGNOSIS — I5022 Chronic systolic (congestive) heart failure: Secondary | ICD-10-CM | POA: Diagnosis present

## 2018-01-16 DIAGNOSIS — D649 Anemia, unspecified: Secondary | ICD-10-CM | POA: Diagnosis present

## 2018-01-16 LAB — COMPREHENSIVE METABOLIC PANEL
ALBUMIN: 4 g/dL (ref 3.5–5.0)
ALT: 25 U/L (ref 0–44)
AST: 27 U/L (ref 15–41)
Alkaline Phosphatase: 80 U/L (ref 38–126)
Anion gap: 8 (ref 5–15)
BUN: 12 mg/dL (ref 8–23)
CHLORIDE: 101 mmol/L (ref 98–111)
CO2: 31 mmol/L (ref 22–32)
Calcium: 10.4 mg/dL — ABNORMAL HIGH (ref 8.9–10.3)
Creatinine, Ser: 0.75 mg/dL (ref 0.44–1.00)
GFR calc Af Amer: >60 mL/min (ref >60)
GFR calc non Af Amer: >60 mL/min (ref >60)
GLUCOSE: 105 mg/dL — AB (ref 70–99)
POTASSIUM: 3.5 mmol/L (ref 3.5–5.1)
Sodium: 140 mmol/L (ref 135–145)
Total Bilirubin: 0.4 mg/dL (ref 0.3–1.2)
Total Protein: 7.7 g/dL (ref 6.5–8.1)

## 2018-01-16 LAB — I-STAT CHEM 8, ED
BUN: 12 mg/dL (ref 8–23)
CREATININE: 0.7 mg/dL (ref 0.44–1.00)
Calcium, Ion: 1.31 mmol/L (ref 1.15–1.40)
Chloride: 101 mmol/L (ref 98–111)
GLUCOSE: 104 mg/dL — AB (ref 70–99)
HEMATOCRIT: 37 % (ref 36.0–46.0)
Hemoglobin: 12.6 g/dL (ref 12.0–15.0)
Potassium: 3.5 mmol/L (ref 3.5–5.1)
Sodium: 141 mmol/L (ref 135–145)
TCO2: 32 mmol/L (ref 22–32)

## 2018-01-16 LAB — CBC
HCT: 38.4 % (ref 36.0–46.0)
HEMOGLOBIN: 11.4 g/dL — AB (ref 12.0–15.0)
MCH: 27.1 pg (ref 26.0–34.0)
MCHC: 29.7 g/dL — ABNORMAL LOW (ref 30.0–36.0)
MCV: 91.4 fL (ref 80.0–100.0)
NRBC: 0 % (ref 0.0–0.2)
PLATELETS: 402 10*3/uL — AB (ref 150–400)
RBC: 4.2 MIL/uL (ref 3.87–5.11)
RDW: 14.3 % (ref 11.5–15.5)
WBC: 7.8 10*3/uL (ref 4.0–10.5)

## 2018-01-16 LAB — RAPID URINE DRUG SCREEN, HOSP PERFORMED
Amphetamines: NOT DETECTED
BENZODIAZEPINES: NOT DETECTED
Barbiturates: NOT DETECTED
COCAINE: NOT DETECTED
Opiates: NOT DETECTED
Tetrahydrocannabinol: NOT DETECTED

## 2018-01-16 LAB — URINALYSIS, ROUTINE W REFLEX MICROSCOPIC
Bilirubin Urine: NEGATIVE
Glucose, UA: NEGATIVE mg/dL
Hgb urine dipstick: NEGATIVE
Ketones, ur: NEGATIVE mg/dL
Leukocytes, UA: NEGATIVE
NITRITE: NEGATIVE
Protein, ur: NEGATIVE mg/dL
SPECIFIC GRAVITY, URINE: 1.011 (ref 1.005–1.030)
pH: 5 (ref 5.0–8.0)

## 2018-01-16 LAB — ECHOCARDIOGRAM COMPLETE
HEIGHTINCHES: 65 in
WEIGHTICAEL: 2839.52 [oz_av]

## 2018-01-16 LAB — LIPID PANEL
Cholesterol: 153 mg/dL (ref 0–200)
HDL: 42 mg/dL (ref >40)
LDL Cholesterol: 77 mg/dL (ref 0–99)
TRIGLYCERIDES: 169 mg/dL — AB (ref <150)
Total CHOL/HDL Ratio: 3.6 RATIO
VLDL: 34 mg/dL (ref 0–40)

## 2018-01-16 LAB — HEMOGLOBIN A1C
HEMOGLOBIN A1C: 6.6 % — AB (ref 4.8–5.6)
MEAN PLASMA GLUCOSE: 142.72 mg/dL

## 2018-01-16 LAB — APTT: APTT: 26 s (ref 24–36)

## 2018-01-16 LAB — PROTIME-INR
INR: 0.95
Prothrombin Time: 12.6 seconds (ref 11.4–15.2)

## 2018-01-16 LAB — GLUCOSE, CAPILLARY
GLUCOSE-CAPILLARY: 158 mg/dL — AB (ref 70–99)
GLUCOSE-CAPILLARY: 166 mg/dL — AB (ref 70–99)
GLUCOSE-CAPILLARY: 137 mg/dL — AB (ref 70–99)
Glucose-Capillary: 141 mg/dL — ABNORMAL HIGH (ref 70–99)

## 2018-01-16 LAB — DIFFERENTIAL
ABS IMMATURE GRANULOCYTES: 0.03 10*3/uL (ref 0.00–0.07)
BASOS PCT: 1 %
Basophils Absolute: 0.1 10*3/uL (ref 0.0–0.1)
EOS ABS: 0.3 10*3/uL (ref 0.0–0.5)
Eosinophils Relative: 3 %
IMMATURE GRANULOCYTES: 0 %
Lymphocytes Relative: 19 %
Lymphs Abs: 1.5 10*3/uL (ref 0.7–4.0)
Monocytes Absolute: 0.8 10*3/uL (ref 0.1–1.0)
Monocytes Relative: 11 %
NEUTROS PCT: 66 %
Neutro Abs: 5.2 10*3/uL (ref 1.7–7.7)

## 2018-01-16 LAB — ETHANOL: Alcohol, Ethyl (B): <10 mg/dL (ref <10)

## 2018-01-16 LAB — I-STAT TROPONIN, ED: Troponin i, poc: 0 ng/mL (ref 0.00–0.08)

## 2018-01-16 LAB — HIV ANTIBODY (ROUTINE TESTING W REFLEX): HIV Screen 4th Generation wRfx: NONREACTIVE

## 2018-01-16 MED ORDER — LORAZEPAM 2 MG/ML IJ SOLN: 0.5000 mg | Freq: Once | INTRAMUSCULAR | Status: DC | PRN

## 2018-01-16 MED ORDER — IOPAMIDOL (ISOVUE-370) INJECTION 76%
INTRAVENOUS | Status: AC
  Filled 2018-01-16: qty 100

## 2018-01-16 MED ORDER — CLOPIDOGREL BISULFATE 75 MG PO TABS
75.0000 mg | ORAL_TABLET | Freq: Every day | ORAL | Status: DC
  Administered 2018-01-16 – 2018-01-18 (×3): 75 mg via ORAL
  Filled 2018-01-16 (×3): qty 1

## 2018-01-16 MED ORDER — ASPIRIN 325 MG PO TABS: 325.0000 mg | ORAL_TABLET | Freq: Every day | ORAL | Status: DC

## 2018-01-16 MED ORDER — ASPIRIN EC 81 MG PO TBEC
81.0000 mg | DELAYED_RELEASE_TABLET | Freq: Every day | ORAL | Status: DC
  Administered 2018-01-16 – 2018-01-18 (×3): 81 mg via ORAL
  Filled 2018-01-16 (×3): qty 1

## 2018-01-16 MED ORDER — INSULIN ASPART 100 UNIT/ML ~~LOC~~ SOLN
0.0000 [IU] | Freq: Three times a day (TID) | SUBCUTANEOUS | Status: DC
  Administered 2018-01-16: 1 [IU] via SUBCUTANEOUS
  Administered 2018-01-16 – 2018-01-17 (×3): 2 [IU] via SUBCUTANEOUS
  Administered 2018-01-17 – 2018-01-18 (×3): 1 [IU] via SUBCUTANEOUS
  Administered 2018-01-18: 2 [IU] via SUBCUTANEOUS

## 2018-01-16 MED ORDER — DIAZEPAM 5 MG/ML IJ SOLN
2.5000 mg | Freq: Once | INTRAMUSCULAR | Status: DC
  Filled 2018-01-16: qty 2

## 2018-01-16 MED ORDER — ASPIRIN 300 MG RE SUPP: 300.0000 mg | Freq: Every day | RECTAL | Status: DC

## 2018-01-16 MED ORDER — ROSUVASTATIN CALCIUM 10 MG PO TABS
10.0000 mg | ORAL_TABLET | Freq: Every day | ORAL | Status: DC
  Administered 2018-01-17: 10 mg via ORAL
  Filled 2018-01-16 (×2): qty 1
  Filled 2018-01-16 (×3): qty 2
  Filled 2018-01-16: qty 1

## 2018-01-16 MED ORDER — ACETAMINOPHEN 325 MG PO TABS
650.0000 mg | ORAL_TABLET | Freq: Four times a day (QID) | ORAL | Status: DC | PRN
  Administered 2018-01-16 – 2018-01-18 (×2): 650 mg via ORAL
  Filled 2018-01-16 (×2): qty 2

## 2018-01-16 MED ORDER — PANTOPRAZOLE SODIUM 40 MG PO TBEC
40.0000 mg | DELAYED_RELEASE_TABLET | Freq: Every day | ORAL | Status: DC
  Administered 2018-01-16 – 2018-01-18 (×3): 40 mg via ORAL
  Filled 2018-01-16 (×3): qty 1

## 2018-01-16 MED ORDER — INSULIN ASPART 100 UNIT/ML ~~LOC~~ SOLN: 0.0000 [IU] | Freq: Every day | SUBCUTANEOUS | Status: DC

## 2018-01-16 MED ORDER — SENNOSIDES-DOCUSATE SODIUM 8.6-50 MG PO TABS
1.0000 | ORAL_TABLET | Freq: Every evening | ORAL | Status: DC | PRN
  Filled 2018-01-16: qty 1

## 2018-01-16 MED ORDER — CALCIUM CARBONATE ANTACID 500 MG PO CHEW
1.0000 | CHEWABLE_TABLET | Freq: Once | ORAL | Status: AC
  Administered 2018-01-16: 200 mg via ORAL
  Filled 2018-01-16: qty 1

## 2018-01-16 MED ORDER — VITAMIN D 25 MCG (1000 UNIT) PO TABS
2000.0000 [IU] | ORAL_TABLET | Freq: Every day | ORAL | Status: DC
  Administered 2018-01-16 – 2018-01-18 (×3): 2000 [IU] via ORAL

## 2018-01-16 MED ORDER — STROKE: EARLY STAGES OF RECOVERY BOOK
Freq: Once | Status: AC
  Administered 2018-01-16: 1
  Filled 2018-01-16 (×2): qty 1

## 2018-01-16 MED ORDER — SODIUM CHLORIDE 0.9 % IV SOLN
INTRAVENOUS | Status: AC
  Administered 2018-01-16: 03:00:00 via INTRAVENOUS

## 2018-01-16 MED ORDER — BUPROPION HCL ER (SR) 150 MG PO TB12
150.0000 mg | ORAL_TABLET | Freq: Two times a day (BID) | ORAL | Status: DC
  Administered 2018-01-16 – 2018-01-18 (×4): 150 mg via ORAL
  Filled 2018-01-16 (×5): qty 1

## 2018-01-16 MED ORDER — IOPAMIDOL (ISOVUE-370) INJECTION 76%
50.0000 mL | Freq: Once | INTRAVENOUS | Status: AC | PRN
  Administered 2018-01-16: 50 mL via INTRAVENOUS

## 2018-01-16 MED ORDER — ENOXAPARIN SODIUM 40 MG/0.4ML ~~LOC~~ SOLN
40.0000 mg | SUBCUTANEOUS | Status: DC
  Administered 2018-01-16 – 2018-01-18 (×3): 40 mg via SUBCUTANEOUS
  Filled 2018-01-16 (×4): qty 0.4

## 2018-01-16 NOTE — Progress Notes (Signed)
  Echocardiogram 2D Echocardiogram has been performed.  Leah Norton 01/16/2018, 11:26 AM

## 2018-01-16 NOTE — Evaluation (Signed)
Physical Therapy Evaluation Patient Details Name: Leah Norton MRN: [id number] DOB: February 04, 1956 Today's Date: 01/16/2018   History of Present Illness  Pt is a 62 y.o female with a PMH consisting of CAD, HTN, R ankle arthritis  and DM presents to the ED with L sided weakness and slurred speech. The initial symptoms began on 01/14/2108 with rhinorrhea, progressing to decreased balance later in the day and eventually L arm/leg weakness as well as slurred speech on 01/14/2018. Pt continued to go to work, and reported to ED on 01/16/2017. 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   Clinical Impression  Pt presented supine, HOB elevated and alert. Pt was willing to participate in PT. Prior to admission, pt was independent with all activities and mobility. Pt educated on BE FAST for stroke and verbalized understanding. Pt modified independent with all bed mobility, with increased time and effort needed. Pt min guard with transfers, ambulation with IV pole and stairs. Pt reported slight RLE pain, and is unsure if this occurred prior to onset of stroke. During stair ambulation, pt able to progress from using bilateral handrail to no handrail used. Pt would continue to benefit from skilled PT in order to increased functional mobility, balance and strength.        Follow Up Recommendations Outpatient PT    Equipment Recommendations  Rolling walker with 5" wheels    Recommendations for Other Services OT consult     Precautions / Restrictions Precautions Precautions: Fall Restrictions Weight Bearing Restrictions: No      Mobility  Bed Mobility Overal bed mobility: Modified Independent             General bed mobility comments: Pt completed rolling, and supine to sit without assistance. Pt used increased time and effort.   Transfers Overall transfer level: Needs assistance Equipment used: None Transfers: Sit to/from Stand Sit to Stand: Min guard          General transfer comment: Pt required min guard for saftey. Completed sit to stand from EEOB and from commode.    Ambulation/Gait Ambulation/Gait assistance: Min guard Gait Distance (Feet): 150 Feet Assistive device: IV Pole Gait Pattern/deviations: Decreased step length - left;Decreased dorsiflexion - left;Decreased stride length;Decreased weight shift to left   Gait velocity interpretation: 1.31 - 2.62 ft/sec, indicative of limited community ambulator General Gait Details: Pt completed 150 feet with assistance of IV pole. Pt at times allowed IV pole to trail behind, decreasing its use as an AD. Pt showed decreased LLE clearance during the swing phase and decreased WB as compare to the RLE.   Stairs Stairs: Yes Stairs assistance: Min guard Stair Management: Two rails;No rails;Step to pattern Number of Stairs: 8 General stair comments: Pt completed 4 trials of two steps ascending and descending. All trials used a step to pattern. The first trial used 2 handrails both ascending and descending. The next three trials used no handrails, with improvement noted after each trial. Pt would lead with the RLE in both ascending and descending.  Wheelchair Mobility    Modified Rankin (Stroke Patients Only) Modified Rankin (Stroke Patients Only) Pre-Morbid Rankin Score: No symptoms Modified Rankin: Moderately severe disability     Balance Overall balance assessment: Needs assistance Sitting-balance support: Feet supported Sitting balance-Leahy Scale: Good Sitting balance - Comments: Pt able to maintain sitting balance at supervision level.    Standing balance support: Single extremity supported;During functional activity Standing balance-Leahy Scale: Fair Standing balance comment: Pt initially required  increased support during standing balance, including the IV pole and furniture when nearby. Pt gradually decreased the use of UE support to only use the IV pole for balance and stability.  Min guard used for safety.                              Pertinent Vitals/Pain Pain Assessment: Faces Faces Pain Scale: Hurts a little bit Pain Location: R foot (During ambulation) Pain Descriptors / Indicators: Discomfort;Sore Pain Intervention(s): Monitored during session    Home Living Family/patient expects to be discharged to:: Private residence Living Arrangements: Alone Available Help at Discharge: Family;Friend(s);Available PRN/intermittently Type of Home: House Home Access: Stairs to enter Entrance Stairs-Rails: None Entrance Stairs-Number of Steps: 3 Home Layout: One level Home Equipment: Cane - single point      Prior Function Level of Independence: Independent         Comments: Pt works as Insurance underwriter at [employer]. Continued to work during her stroke symptoms.      Hand Dominance   Dominant Hand: Right    Extremity/Trunk Assessment   Upper Extremity Assessment Upper Extremity Assessment: Defer to OT evaluation    Lower Extremity Assessment Lower Extremity Assessment: LLE deficits/detail LLE Deficits / Details: General weakness of LLE     Cervical / Trunk Assessment Cervical / Trunk Assessment: Normal  Communication   Communication: No difficulties  Cognition Arousal/Alertness: Awake/alert Behavior During Therapy: WFL for tasks assessed/performed Overall Cognitive Status: Within Functional Limits for tasks assessed                                        General Comments General comments (skin integrity, edema, etc.): Requires glasses for improved vision during ambulation      Exercises     Assessment/Plan    PT Assessment Patient needs continued PT services  PT Problem List Decreased strength;Decreased range of motion;Decreased activity tolerance;Decreased mobility;Decreased balance;Decreased coordination;Decreased knowledge of use of DME;Decreased safety awareness       PT Treatment Interventions DME  instruction;Gait training;Stair training;Functional mobility training;Therapeutic activities;Therapeutic exercise;Balance training;Neuromuscular re-education    PT Goals (Current goals can be found in the Care Plan section)  Acute Rehab PT Goals Patient Stated Goal: Return to work  PT Goal Formulation: With patient Time For Goal Achievement: 01/30/18 Potential to Achieve Goals: Good    Frequency Min 4X/week   Barriers to discharge        Co-evaluation               AM-PAC PT "6 Clicks" Daily Activity  Outcome Measure Difficulty turning over in bed (including adjusting bedclothes, sheets and blankets)?: None Difficulty moving from lying on back to sitting on the side of the bed? : None Difficulty sitting down on and standing up from a chair with arms (e.g., wheelchair, bedside commode, etc,.)?: Unable Help needed moving to and from a bed to chair (including a wheelchair)?: A Little Help needed walking in hospital room?: A Little Help needed climbing 3-5 steps with a railing? : A Little 6 Click Score: 18    End of Session Equipment Utilized During Treatment: Gait belt Activity Tolerance: Patient tolerated treatment well Patient left: in chair;with call bell/phone within reach;with chair alarm set;with nursing/sitter in room Nurse Communication: Mobility status PT Visit Diagnosis: Unsteadiness on feet (R26.81);Other abnormalities of gait and mobility (R26.89);Muscle weakness (  generalized) (M62.81)    Time: 0817-0909 PT Time Calculation (min) (ACUTE ONLY): 52 min   Charges:   PT Evaluation $PT Eval Moderate Complexity: 1 Mod PT Treatments $Gait Training: 8-22 mins $Therapeutic Activity: 8-22 mins        Wandra Feinstein, SPT Acute Rehab [phone] (pager) [phone] (office)  Lott Seelbach 01/16/2018, 9:58 AM

## 2018-01-16 NOTE — Progress Notes (Addendum)
Leah Norton is a 62 y.o. female with medical history significant for coronary artery disease with stent, hypertension, and type 2 diabetes mellitus, presenting to the emergency department for evaluation of slurred speech and left-sided weakness.  Patient reports that she developed some rhinorrhea early in the a.m. of 01/13/2018, took a Benadryl for this, later noted some difficulty with balance that she suspected was adverse effect of Benadryl, but then at ~7 PM on 01/14/2018, noted weakness involving the left arm and leg as well as slurred speech.  Symptoms have failed to improve and so she now comes into the ED for evaluation.  She denies any recent fevers, chills, chest pain, or palpitations.  She denies headache or change in vision or hearing.  01/16/2018: Patient seen and examined with her sister and niece at bedside.  On exam noted left facial droop.  Speech was fluent.  Reviewed MRI with patient in the room which revealed acute or early subacute infarct within the right pons.  Please refer to H&P dictated by Dr. Myna Hidalgo on 01/16/2018 for further details of the assessment and plan.

## 2018-01-16 NOTE — Progress Notes (Signed)
STROKE TEAM PROGRESS NOTE   SUBJECTIVE (INTERVAL HISTORY) Her grandson is at the bedside.  Pt still has left facial droop and mild left hand weakness. PT recommend outpt PT. She was educated on smoke cessation.     OBJECTIVE Vitals:   01/16/18 0620 01/16/18 0819 01/16/18 0919 01/16/18 1119  BP: (!) 151/67 (!) 142/93  (!) 128/108  Pulse: 76 78 70 82  Resp:  18  16  Temp: 98.1 F (36.7 C) 98.3 F (36.8 C) 98.3 F (36.8 C) 98 F (36.7 C)  TempSrc: Oral Oral Oral Oral  SpO2: 98% 98% 98% 99%  Weight:      Height:        CBC:  Recent Labs  Lab 01/16/18 0006 01/16/18 0009  WBC 7.8  --   NEUTROABS 5.2  --   HGB 11.4* 12.6  HCT 38.4 37.0  MCV 91.4  --   PLT 402*  --     Basic Metabolic Panel:  Recent Labs  Lab 01/16/18 0006 01/16/18 0009  NA 140 141  K 3.5 3.5  CL 101 101  CO2 31  --   GLUCOSE 105* 104*  BUN 12 12  CREATININE 0.75 0.70  CALCIUM 10.4*  --     Lipid Panel:     Component Value Date/Time   CHOL 153 01/16/2018 0318   TRIG 169 (H) 01/16/2018 0318   HDL 42 01/16/2018 0318   CHOLHDL 3.6 01/16/2018 0318   VLDL 34 01/16/2018 0318   LDLCALC 77 01/16/2018 0318   HgbA1c:  Lab Results  Component Value Date   HGBA1C 6.6 (H) 01/16/2018   Urine Drug Screen:     Component Value Date/Time   LABOPIA NONE DETECTED 01/16/2018 0051   COCAINSCRNUR NONE DETECTED 01/16/2018 0051   LABBENZ NONE DETECTED 01/16/2018 0051   AMPHETMU NONE DETECTED 01/16/2018 0051   THCU NONE DETECTED 01/16/2018 0051   LABBARB NONE DETECTED 01/16/2018 0051    Alcohol Level     Component Value Date/Time   ETH <10 01/16/2018 0006    IMAGING   Ct Angio Head W Or Wo Contrast Ct Angio Neck W Or Wo Contrast 01/16/2018 IMPRESSION:  RIGHT paramedian brainstem infarct is not visible on pre or postcontrast CT imaging. No visible hemorrhagic transformation. No posterior circulation/basilar artery stenosis or dissection is observed. Minor calcific atheromatous change of the  BILATERAL cavernous carotid arteries. 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   Mr Brain Wo Contrast 01/16/2018 IMPRESSION:  1. Subcentimeter acute or early subacute infarct within the right pons, in keeping with reported left-sided weakness.  2. No acute hemorrhage, mass effect or hydrocephalus.    PHYSICAL EXAM  Temp:  [98 F (36.7 C)-98.5 F (36.9 C)] 98.1 F (36.7 C) (11/07 1608) Pulse Rate:  [68-82] 73 (11/07 1608) Resp:  [14-20] 18 (11/07 1608) BP: (128-158)/(65-108) 149/75 (11/07 1608) SpO2:  [95 %-100 %] 100 % (11/07 1608) Weight:  [80.5 kg-81.6 kg] 80.5 kg (11/07 0254)  General - Well nourished, well developed, in no apparent distress.  Ophthalmologic - fundi not visualized due to noncooperation.  Cardiovascular - Regular rate and rhythm.  Mental Status -  Level of arousal and orientation to time, place, and person were intact. Language including expression, naming, repetition, comprehension was assessed and found intact. Fund of Knowledge was assessed and was intact.  Cranial Nerves II - XII - II - Visual field  intact OU. III, IV, VI - Extraocular movements intact. V - Facial sensation intact bilaterally. VII - mild left facial droop. VIII - Hearing & vestibular intact bilaterally. X - Palate elevates symmetrically, mild dysarthria XI - Chin turning & shoulder shrug intact bilaterally. XII - Tongue protrusion intact.  Motor Strength - The patient's strength was normal in all extremities except left proximal 4+/5 and mild dexterity difficulty and pronator drift was present.  Bulk was normal and fasciculations were absent.   Motor Tone - Muscle tone was assessed at the neck and appendages and was normal.  Reflexes - The patient's reflexes were symmetrical in all extremities and she had no pathological reflexes.  Sensory - Light  touch, temperature/pinprick were assessed and were symmetrical.    Coordination - The patient had normal movements in the hands with no ataxia or dysmetria.  Tremor was absent.  Gait and Station - deferred.   ASSESSMENT/PLAN [name] is a 62 y.o. female with history of CAD s/p DES to pLAD in 12/17, hypercholesterolemia, HTN, DM2 and smoking  presenting with slurred speech, balance problems, AMS, and left sided weakness. She did not receive IV t-PA due to late presentation.   Stroke right paramedian pontine infarct likely due to small vessel disease.  CT head - RIGHT paramedian brainstem infarct is not visible on pre or postcontrast CT imaging.  MRI head - Subcentimeter acute or early subacute infarct within the right pons  CTA H&N - A 50-75% stenosis proximal RIGHT M1 MCA, noncontributory with respect to the acute ischemia.  2D Echo - EF 40-45%  LDL - 77  HgbA1c - 6.6  VTE prophylaxis - Lovenox  Diet - Heart healthy / carb modified with thin liquids.  aspirin 81 mg daily prior to admission, now on aspirin 81 mg daily and clopidogrel 75 mg daily. Continue DAPT for 3 weeks and then plavix alone.   Patient counseled to be compliant with her antithrombotic medications  Ongoing aggressive stroke risk factor management  Therapy recommendations:  Outpatient PT  Disposition:  Pending  Hypertension  Stable . Permissive hypertension (OK if < 220/120) but gradually normalize in 5-7 days . Long-term BP goal normotensive  Hyperlipidemia  Lipid lowering medication PTA:  Crestor 10 mg daily  LDL 77, goal < 70  Current lipid lowering medication: Crestor 10 mg daily  Continue statin at discharge  Diabetes  HgbA1c 6.6, goal < 7.0  Controlled  SSI  CBG monitoring  Tobacco abuse  Current smoker  Smoking cessation counseling provided  Nicotine patch provided  Pt is willing to quit  Other Stroke Risk Factors  Advanced age  ETOH use, advised to  drink no more than 1 alcoholic beverage per day.  Coronary artery disease  Migraines  Other Active Problems   Hospital day # 0  Neurology will sign off. Please call with questions. Pt will follow up with stroke clinic NP at [facility] in about 4 weeks. Thanks for the consult.  Rosalin Hawking, MD PhD Stroke Neurology 01/16/2018 4:23 PM    To contact Stroke Continuity provider, please refer to [email or web address] After hours, contact General Neurology

## 2018-01-16 NOTE — Consult Note (Signed)
Referring Physician: Dr. Myna Hidalgo    Chief Complaint:  Subacute pontine stroke  HPI: Leah Norton is an 62 y.o. female who presents to the [facility] ED tonight with stroke like symptoms beginning Monday at about 0400. The patient was noted by the triage RN to have slurred speech, disorganized thinking and left sided weakness. 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 On Tuesday at 7:00 PM while at work she had slurred speech with LUE and LLE weakness; the symptoms have persisted since then. On Wednesday evening, family came to her house and noticed a left sided facial droop, so she was brought to the ED to be evaluated.   MRI in the ED revealed the following (images reviewed): 1. Subcentimeter acute or early subacute infarct within the right pons, in keeping with reported left-sided weakness. 2. No acute hemorrhage, mass effect or hydrocephalus.  EKG: Sinus rhythm Left axis deviation No significant change since last tracing   Her PMHx includes CAD s/p DES to pLAD in 12/17, hypercholesterolemia, HTN, DM2 and smoking. Home medications include ASA and rosuvastatin.    Past Medical History:  Diagnosis Date  . Anginal pain ([place])   . Arthritis    "right anikle" (02/29/2016)  . CAD in native artery 02/08/2016   Three-vessel coronary artery calcification on CT scan  . Complication of anesthesia    "had reaction w/hives, rash, itching" (02/29/2016)  . Constipation   . Coronary artery disease    12/17 PCI with DESx1 to pLAD EF normal  . Enlarged heart   . GERD (gastroesophageal reflux disease)   . High cholesterol   . Hypertension   . Migraine    hx  . Symptomatic anemia 07/10/2016  . Type II diabetes mellitus ([place])     Past Surgical History:  Procedure Laterality Date  . ABDOMINAL HYSTERECTOMY    . CARDIAC CATHETERIZATION N/A 02/29/2016   Procedure: Left Heart Cath and Coronary Angiography;  Surgeon: Belva Crome, MD;  Location: [facility];  Service: Cardiovascular;  Laterality: N/A;  . CARDIAC CATHETERIZATION N/A 02/29/2016   Procedure: Coronary Stent Intervention;  Surgeon: Belva Crome, MD;  Location: [facility];  Service: Cardiovascular;  Laterality: N/A;  . CARDIAC CATHETERIZATION  2005   "no blockages"  . COLONOSCOPY WITH PROPOFOL N/A 03/15/2014   Procedure: COLONOSCOPY WITH PROPOFOL;  Surgeon: Garlan Fair, MD;  Location: [facility];  Service: Endoscopy;  Laterality: N/A;  . CORONARY ANGIOPLASTY WITH STENT PLACEMENT    . ESOPHAGOGASTRODUODENOSCOPY N/A 07/11/2016   Procedure: ESOPHAGOGASTRODUODENOSCOPY (EGD);  Surgeon: Laurence Spates, MD;  Location: [facility] ENDOSCOPY;  Service: Endoscopy;  Laterality: N/A;  . FRACTURE SURGERY    . MULTIPLE TOOTH EXTRACTIONS     teeth removed  . ORIF ANKLE FRACTURE Right 11/12/2012   Procedure: OPEN REDUCTION INTERNAL FIXATION (ORIF) ANKLE FRACTURE;  Surgeon: Marianna Payment, MD;  Location: [facility];  Service: Orthopedics;  Laterality: Right;  Open reduction internal fixation ankle fracture, trimalleolar    Family History  Problem Relation Age of Onset  . Hypertension Mother    Social History:  reports that she has been smoking cigarettes. She has a 20.00 pack-year smoking history. She has never used smokeless tobacco. She reports that she drinks alcohol. She reports that she does not use drugs.  Allergies:  Allergies  Allergen Reactions  . Codeine Rash  . Erythromycin Rash  . Penicillins Rash    Has patient had a PCN reaction  causing immediate rash, facial/tongue/throat swelling, SOB or lightheadedness with hypotension: Yes Has patient had a PCN reaction causing severe rash involving mucus membranes or skin necrosis: No Has patient had a PCN reaction that required hospitalization: No Has patient had a PCN reaction occurring within the last 10 years: Yes If all of the above answers are "NO", then may proceed with Cephalosporin use.   . Sulfonamide Derivatives Rash  .  Vicodin [Hydrocodone-Acetaminophen] Rash    Home Medications:    ROS: No vision changes, headache, speech deficit, N/V, SOB or CP. Other ROS as per HPI.   Physical Examination: Blood pressure (!) 143/71, pulse 72, temperature 98.5 F (36.9 C), temperature source Oral, resp. rate 14, height _0  (1.676 m), weight 81.6 kg, SpO2 95 %.  HEENT: [place]/AT Lungs: Respirations unlabored Ext: No edema  Neurologic Examination: Mental Status: Alert, oriented, thought content appropriate.  Speech fluent without evidence of aphasia.  Able to follow all commands without difficulty. Mild dysarthria.  Cranial Nerves: II:  Visual fields intact. PERRL.  III,IV, VI: No ptosis. EOMI except for mild deficit OS with supraduction V,VII: Left facial droop. Facial temp sensation equal bilaterally VIII: hearing intact to voice IX,X: Mild hypophonia XI: bilateral shoulder shrug is symmetric XII: midline tongue extension  Motor: RUE and RLE 5/5 LUE with subtle 4+/5 weakness LLE with subtle 4+/5 weakness Sensory: Temp intact throughout, bilaterally Deep Tendon Reflexes:  2+ bilateral upper and lower extremities.  Plantars: Right: downgoing   Left: upgoing Cerebellar: Normal on the right. Dystaxic with FNF and H-S on the left.  Gait: Deferred  Results for orders placed or performed during the hospital encounter of 01/15/18 (from the past 48 hour(s))  Ethanol     Status: None   Collection Time: 01/16/18 12:06 AM  Result Value Ref Range   Alcohol, Ethyl (B) <10 <10 mg/dL    Comment: (NOTE) Lowest detectable limit for serum alcohol is 10 mg/dL. For medical purposes only. Performed at [facility], [place] [address], [place], [place] [address]   Protime-INR     Status: None   Collection Time: 01/16/18 12:06 AM  Result Value Ref Range   Prothrombin Time 12.6 11.4 - 15.2 seconds   INR 0.95     Comment: Performed at [facility] [address] [place], [place] [address]  APTT     Status:  None   Collection Time: 01/16/18 12:06 AM  Result Value Ref Range   aPTT 26 24 - 36 seconds    Comment: Performed at [facility] [address], [place], [place] [address]  CBC     Status: Abnormal   Collection Time: 01/16/18 12:06 AM  Result Value Ref Range   WBC 7.8 4.0 - 10.5 K/uL   RBC 4.20 3.87 - 5.11 MIL/uL   Hemoglobin 11.4 (L) 12.0 - 15.0 g/dL   HCT 38.4 36.0 - 46.0 %   MCV 91.4 80.0 - 100.0 fL   MCH 27.1 26.0 - 34.0 pg   MCHC 29.7 (L) 30.0 - 36.0 g/dL   RDW 14.3 11.5 - 15.5 %   Platelets 402 (H) 150 - 400 K/uL   nRBC 0.0 0.0 - 0.2 %    Comment: Performed at [facility], [place] [address] [place], [place] [address]  Differential     Status: None   Collection Time: 01/16/18 12:06 AM  Result Value Ref Range   Neutrophils Relative % 66 %   Neutro Abs 5.2 1.7 -  7.7 K/uL   Lymphocytes Relative 19 %   Lymphs Abs 1.5 0.7 - 4.0 K/uL   Monocytes Relative 11 %   Monocytes Absolute 0.8 0.1 - 1.0 K/uL   Eosinophils Relative 3 %   Eosinophils Absolute 0.3 0.0 - 0.5 K/uL   Basophils Relative 1 %   Basophils Absolute 0.1 0.0 - 0.1 K/uL   Immature Granulocytes 0 %   Abs Immature Granulocytes 0.03 0.00 - 0.07 K/uL    Comment: Performed at [facility] [address], [place], [place] [address]  Comprehensive metabolic panel     Status: Abnormal   Collection Time: 01/16/18 12:06 AM  Result Value Ref Range   Sodium 140 135 - 145 mmol/L   Potassium 3.5 3.5 - 5.1 mmol/L   Chloride 101 98 - 111 mmol/L   CO2 31 22 - 32 mmol/L   Glucose, Bld 105 (H) 70 - 99 mg/dL   BUN 12 8 - 23 mg/dL   Creatinine, Ser 0.75 0.44 - 1.00 mg/dL   Calcium 10.4 (H) 8.9 - 10.3 mg/dL   Total Protein 7.7 6.5 - 8.1 g/dL   Albumin 4.0 3.5 - 5.0 g/dL   AST 27 15 - 41 U/L   ALT 25 0 - 44 U/L   Alkaline Phosphatase 80 38 - 126 U/L   Total Bilirubin 0.4 0.3 - 1.2 mg/dL   GFR calc non Af Amer >60 >60 mL/min   GFR calc Af Amer >60 >60 mL/min    Comment: (NOTE) The eGFR has been calculated  using the CKD EPI equation. This calculation has not been validated in all clinical situations. eGFR's persistently <60 mL/min signify possible Chronic Kidney Disease.    Anion gap 8 5 - 15    Comment: Performed at [facility] [address] [place], [place] [address]  I-stat troponin, ED     Status: None   Collection Time: 01/16/18 12:08 AM  Result Value Ref Range   Troponin i, poc 0.00 0.00 - 0.08 ng/mL   Comment 3            Comment: Due to the release kinetics of cTnI, a negative result within the first hours of the onset of symptoms does not rule out myocardial infarction with certainty. If myocardial infarction is still suspected, repeat the test at appropriate intervals.   I-Stat Chem 8, ED     Status: Abnormal   Collection Time: 01/16/18 12:09 AM  Result Value Ref Range   Sodium 141 135 - 145 mmol/L   Potassium 3.5 3.5 - 5.1 mmol/L   Chloride 101 98 - 111 mmol/L   BUN 12 8 - 23 mg/dL   Creatinine, Ser 0.70 0.44 - 1.00 mg/dL   Glucose, Bld 104 (H) 70 - 99 mg/dL   Calcium, Ion 1.31 1.15 - 1.40 mmol/L   TCO2 32 22 - 32 mmol/L   Hemoglobin 12.6 12.0 - 15.0 g/dL   HCT 37.0 36.0 - 46.0 %  Urine rapid drug screen (hosp performed)     Status: None   Collection Time: 01/16/18 12:51 AM  Result Value Ref Range   Opiates NONE DETECTED NONE DETECTED   Cocaine NONE DETECTED NONE DETECTED   Benzodiazepines NONE DETECTED NONE DETECTED   Amphetamines NONE DETECTED NONE DETECTED   Tetrahydrocannabinol NONE DETECTED NONE DETECTED   Barbiturates NONE DETECTED NONE DETECTED    Comment: (NOTE) DRUG SCREEN FOR MEDICAL PURPOSES ONLY.  IF CONFIRMATION IS NEEDED FOR ANY PURPOSE, NOTIFY LAB WITHIN  5 DAYS. LOWEST DETECTABLE LIMITS FOR URINE DRUG SCREEN Drug Class                     Cutoff (ng/mL) Amphetamine and metabolites    1000 Barbiturate and metabolites    200 Benzodiazepine                 474 Tricyclics and metabolites     300 Opiates and metabolites         300 Cocaine and metabolites        300 THC                            50 Performed at [facility], [place] [address] [place], [place] [address]   Urinalysis, Routine w reflex microscopic     Status: None   Collection Time: 01/16/18 12:51 AM  Result Value Ref Range   Color, Urine YELLOW YELLOW   APPearance CLEAR CLEAR   Specific Gravity, Urine 1.011 1.005 - 1.030   pH 5.0 5.0 - 8.0   Glucose, UA NEGATIVE NEGATIVE mg/dL   Hgb urine dipstick NEGATIVE NEGATIVE   Bilirubin Urine NEGATIVE NEGATIVE   Ketones, ur NEGATIVE NEGATIVE mg/dL   Protein, ur NEGATIVE NEGATIVE mg/dL   Nitrite NEGATIVE NEGATIVE   Leukocytes, UA NEGATIVE NEGATIVE    Comment: Performed at [facility] [address], [place],  [address]   Mr Brain 39 Contrast  Result Date: 01/16/2018 CLINICAL DATA:  Left-sided weakness, slurred speech and altered mental status EXAM: MRI HEAD WITHOUT CONTRAST TECHNIQUE: Multiplanar, multiecho pulse sequences of the brain and surrounding structures were obtained without intravenous contrast. COMPARISON:  None. FINDINGS: BRAIN: Acute infarct within the right pons measures 7 mm. The midline structures are normal. No midline shift or other mass effect. There are no old infarcts. Multifocal white matter hyperintensity, most commonly due to chronic ischemic microangiopathy. The cerebral and cerebellar volume are age-appropriate. Susceptibility-sensitive sequences show no chronic microhemorrhage or superficial siderosis. VASCULAR: Major intracranial arterial and venous sinus flow voids are normal. SKULL AND UPPER CERVICAL SPINE: Calvarial bone marrow signal is normal. There is no skull base mass. Visualized upper cervical spine and soft tissues are normal. SINUSES/ORBITS: No fluid levels or advanced mucosal thickening. No mastoid or middle ear effusion. The orbits are normal. IMPRESSION: 1. Subcentimeter acute or early subacute infarct within the right pons, in keeping with reported  left-sided weakness. 2. No acute hemorrhage, mass effect or hydrocephalus. Electronically Signed   By: Ulyses Jarred M.D.   On: 01/16/2018 01:48    Assessment: 62 y.o. female with early subacute infarction within the right pons 1. MRI reveals a subcentimeter acute or early subacute infarct within the right pons, in keeping with reported left-sided weakness. 2. Exam shows subtle left sided weakness, mild dysarthria, left facial droop and left sided ataxia 3. Stroke Risk Factors - CAD, hypercholesterolemia, HTN, DM2 and smoking.   Plan: 1. HgbA1c, fasting lipid panel 2. MRA of the brain without contrast 3. PT consult, OT consult, Speech consult 4. Echocardiogram 5. Carotid dopplers 6. Prophylactic therapy- Add Plavix to ASA for DAPT.  7. Continue rosuvastatin 8. Telemetry monitoring 9. Frequent neuro checks 10. Smoking cessation counseling.  11. BP management   _0  signed: Dr. Kerney Elbe  01/16/2018, 2:12 AM

## 2018-01-16 NOTE — Progress Notes (Signed)
PT Progress Note for Charges    01/16/18 0956  PT General Charges  $$ ACUTE PT VISIT 1 Visit  PT Evaluation  $PT Eval Moderate Complexity 1 Mod  PT Treatments  $Gait Training 8-22 mins  $Therapeutic Activity 8-22 mins  Sherie Don, PT, DPT  Acute Rehabilitation Services Pager [phone] Office [phone]

## 2018-01-16 NOTE — H&P (Signed)
History and Physical    Leah Norton JME:[id number] DOB: 04-01-1955 DOA: 01/15/2018  PCP: Seward Carol, MD   Patient coming from: Home   Chief Complaint: Left-sided weakness, slurred speech   HPI: Leah Norton is a 62 y.o. female with medical history significant for coronary artery disease with stent, hypertension, and type 2 diabetes mellitus, presenting to the emergency department for evaluation of slurred speech and left-sided weakness.  Patient reports that she developed some rhinorrhea early in the a.m. of 01/13/2018, took a Benadryl for this, later noted some difficulty with balance that she suspected was adverse effect of Benadryl, but then at ~7 PM on 01/14/2018, noted weakness involving the left arm and leg as well as slurred speech.  Symptoms have failed to improve and so she now comes into the ED for evaluation.  She denies any recent fevers, chills, chest pain, or palpitations.  She denies headache or change in vision or hearing.  ED Course: Upon arrival to the ED, patient is found to be afebrile, saturating well on room air, and with vitals otherwise normal.  EKG features a sinus rhythm with LAD.  MRI brain reveals subcentimeter acute or early subacute infarction within the right pons, but no hemorrhage or mass-effect.  Chemistry panel is unremarkable and CBC notable for a mild stable thrombocytosis.  Neurology was consulted by the ED physician and the patient will be observed on the telemetry unit for ongoing evaluation and management of acute ischemic CVA.  Review of Systems:  All other systems reviewed and apart from HPI, are negative.  Past Medical History:  Diagnosis Date  . Anginal pain ([place])   . Arthritis    "right anikle" (02/29/2016)  . CAD in native artery 02/08/2016   Three-vessel coronary artery calcification on CT scan  . Complication of anesthesia    "had reaction w/hives, rash, itching" (02/29/2016)  . Constipation   . Coronary artery disease    12/17  PCI with DESx1 to pLAD EF normal  . Enlarged heart   . GERD (gastroesophageal reflux disease)   . High cholesterol   . Hypertension   . Migraine    hx  . Symptomatic anemia 07/10/2016  . Type II diabetes mellitus ([place])     Past Surgical History:  Procedure Laterality Date  . ABDOMINAL HYSTERECTOMY    . CARDIAC CATHETERIZATION N/A 02/29/2016   Procedure: Left Heart Cath and Coronary Angiography;  Surgeon: Belva Crome, MD;  Location: [facility];  Service: Cardiovascular;  Laterality: N/A;  . CARDIAC CATHETERIZATION N/A 02/29/2016   Procedure: Coronary Stent Intervention;  Surgeon: Belva Crome, MD;  Location: [facility];  Service: Cardiovascular;  Laterality: N/A;  . CARDIAC CATHETERIZATION  2005   "no blockages"  . COLONOSCOPY WITH PROPOFOL N/A 03/15/2014   Procedure: COLONOSCOPY WITH PROPOFOL;  Surgeon: Garlan Fair, MD;  Location: [facility];  Service: Endoscopy;  Laterality: N/A;  . CORONARY ANGIOPLASTY WITH STENT PLACEMENT    . ESOPHAGOGASTRODUODENOSCOPY N/A 07/11/2016   Procedure: ESOPHAGOGASTRODUODENOSCOPY (EGD);  Surgeon: Laurence Spates, MD;  Location[facility];  Service: Endoscopy;  Laterality: N/A;  . FRACTURE SURGERY    . MULTIPLE TOOTH EXTRACTIONS     teeth removed  . ORIF ANKLE FRACTURE Right 11/12/2012   Procedure: OPEN REDUCTION INTERNAL FIXATION (ORIF) ANKLE FRACTURE;  Surgeon: Marianna Payment, MD;  Location: [facility];  Service: Orthopedics;  Laterality: Right;  Open reduction internal fixation ankle fracture, trimalleolar     reports that she  has been smoking cigarettes. She has a 20.00 pack-year smoking history. She has never used smokeless tobacco. She reports that she drinks alcohol. She reports that she does not use drugs.  Allergies  Allergen Reactions  . Codeine Rash  . Erythromycin Rash  . Penicillins Rash    Has patient had a PCN reaction causing immediate rash, facial/tongue/throat swelling, SOB or lightheadedness with hypotension:  Yes Has patient had a PCN reaction causing severe rash involving mucus membranes or skin necrosis: No Has patient had a PCN reaction that required hospitalization: No Has patient had a PCN reaction occurring within the last 10 years: Yes If all of the above answers are "NO", then may proceed with Cephalosporin use.   . Sulfonamide Derivatives Rash  . Vicodin [Hydrocodone-Acetaminophen] Rash    Family History  Problem Relation Age of Onset  . Hypertension Mother      Prior to Admission medications   Medication Sig Start Date End Date Taking? Authorizing Provider  aspirin EC 81 MG tablet Take 81 mg by mouth daily.   Yes [provider]  atenolol (TENORMIN) 50 MG tablet Take 50 mg by mouth daily.    Yes [provider]  buPROPion (WELLBUTRIN SR) 150 MG 12 hr tablet Take 150 mg by mouth 2 (two) times daily.    Yes [provider]  Cholecalciferol (VITAMIN D3) 1000 units CAPS Take 2,000 Units by mouth daily.   Yes [provider]  Cod Liver Oil CAPS Take 1 capsule by mouth once a week. One to two times a day    Yes [provider]  hydrochlorothiazide (HYDRODIURIL) 25 MG tablet Take 25 mg by mouth every morning.    Yes [provider]  losartan (COZAAR) 100 MG tablet TAKE ONE TABLET BY MOUTH IN THE MORNING Patient taking differently: Take 100 mg by mouth daily.  12/28/16  Yes Belva Crome, MD  nitroGLYCERIN (NITROSTAT) 0.4 MG SL tablet Place 0.4 mg under the tongue every 5 (five) minutes as needed for chest pain (Call 911 at 3rd dose within 15 minutes.).   Yes [provider]  pantoprazole (PROTONIX) 40 MG tablet TAKE 1 TABLET BY MOUTH ONCE DAILY Patient taking differently: Take 40 mg by mouth daily.  08/12/17  Yes Belva Crome, MD  rosuvastatin (CRESTOR) 10 MG tablet Take 10 mg by mouth daily.   Yes [provider]  sitaGLIPtin-metformin (JANUMET) 50-1000 MG per tablet Take 1 tablet by mouth 2 (two) times daily with a  meal.   Yes [provider]    Physical Exam: Vitals:   01/15/18 2330 01/15/18 2345 01/16/18 0000 01/16/18 0015  BP: (!) 142/65 (!) 158/89 (!) 155/80 (!) 143/71  Pulse: 68 75 75 72  Resp: 17 14 14 14   Temp:      TempSrc:      SpO2: 98% 96% 99% 95%  Weight:      Height:        Constitutional: NAD, calm  Eyes: PERTLA, lids and conjunctivae normal ENMT: Mucous membranes are moist. Posterior pharynx clear of any exudate or lesions.   Neck: normal, supple, no masses, no thyromegaly Respiratory: clear to auscultation bilaterally, no wheezing, no crackles. Normal respiratory effort.    Cardiovascular: S1 & S2 heard, regular rate and rhythm. No extremity edema.   Abdomen: No distension, no tenderness, soft. Bowel sounds normal.  Musculoskeletal: no clubbing / cyanosis. No joint deformity upper and lower extremities.   Skin: no significant rashes, lesions, ulcers. Warm, dry,  well-perfused. Neurologic: Left lower facial weakness. Dysarthria. Sensation intact. Strength 5/5 throughout right upper and lower extremities, 4/5 in LUE.  Psychiatric: Alert and oriented to person, place, and situation. Pleasant and cooperative.    Labs on Admission: I have personally reviewed following labs and imaging studies  CBC: Recent Labs  Lab 01/16/18 0006 01/16/18 0009  WBC 7.8  --   NEUTROABS 5.2  --   HGB 11.4* 12.6  HCT 38.4 37.0  MCV 91.4  --   PLT 402*  --    Basic Metabolic Panel: Recent Labs  Lab 01/16/18 0006 01/16/18 0009  NA 140 141  K 3.5 3.5  CL 101 101  CO2 31  --   GLUCOSE 105* 104*  BUN 12 12  CREATININE 0.75 0.70  CALCIUM 10.4*  --    GFR: Estimated Creatinine Clearance: 78.5 mL/min (by C-G formula based on SCr of 0.7 mg/dL). Liver Function Tests: Recent Labs  Lab 01/16/18 0006  AST 27  ALT 25  ALKPHOS 80  BILITOT 0.4  PROT 7.7  ALBUMIN 4.0   No results for input(s): LIPASE, AMYLASE in the last 168 hours. No results for input(s): AMMONIA in the last  168 hours. Coagulation Profile: Recent Labs  Lab 01/16/18 0006  INR 0.95   Cardiac Enzymes: No results for input(s): CKTOTAL, CKMB, CKMBINDEX, TROPONINI in the last 168 hours. BNP (last 3 results) No results for input(s): PROBNP in the last 8760 hours. HbA1C: No results for input(s): HGBA1C in the last 72 hours. CBG: No results for input(s): GLUCAP in the last 168 hours. Lipid Profile: No results for input(s): CHOL, HDL, LDLCALC, TRIG, CHOLHDL, LDLDIRECT in the last 72 hours. Thyroid Function Tests: No results for input(s): TSH, T4TOTAL, FREET4, T3FREE, THYROIDAB in the last 72 hours. Anemia Panel: No results for input(s): VITAMINB12, FOLATE, FERRITIN, TIBC, IRON, RETICCTPCT in the last 72 hours. Urine analysis:    Component Value Date/Time   COLORURINE YELLOW 01/16/2018 0051   APPEARANCEUR CLEAR 01/16/2018 0051   LABSPEC 1.011 01/16/2018 0051   PHURINE 5.0 01/16/2018 0051   GLUCOSEU NEGATIVE 01/16/2018 0051   HGBUR NEGATIVE 01/16/2018 0051   HGBUR negative 03/24/2008 0943   BILIRUBINUR NEGATIVE 01/16/2018 0051   KETONESUR NEGATIVE 01/16/2018 0051   PROTEINUR NEGATIVE 01/16/2018 0051   UROBILINOGEN 0.2 04/03/2011 2202   NITRITE NEGATIVE 01/16/2018 0051   LEUKOCYTESUR NEGATIVE 01/16/2018 0051   Sepsis Labs: @LABRCNTIP (procalcitonin:4,lacticidven:4) )No results found for this or any previous visit (from the past 240 hour(s)).   Radiological Exams on Admission: Mr Brain Wo Contrast  Result Date: 01/16/2018 CLINICAL DATA:  Left-sided weakness, slurred speech and altered mental status EXAM: MRI HEAD WITHOUT CONTRAST TECHNIQUE: Multiplanar, multiecho pulse sequences of the brain and surrounding structures were obtained without intravenous contrast. COMPARISON:  None. FINDINGS: BRAIN: Acute infarct within the right pons measures 7 mm. The midline structures are normal. No midline shift or other mass effect. There are no old infarcts. Multifocal white matter hyperintensity, most  commonly due to chronic ischemic microangiopathy. The cerebral and cerebellar volume are age-appropriate. Susceptibility-sensitive sequences show no chronic microhemorrhage or superficial siderosis. VASCULAR: Major intracranial arterial and venous sinus flow voids are normal. SKULL AND UPPER CERVICAL SPINE: Calvarial bone marrow signal is normal. There is no skull base mass. Visualized upper cervical spine and soft tissues are normal. SINUSES/ORBITS: No fluid levels or advanced mucosal thickening. No mastoid or middle ear effusion. The orbits are normal. IMPRESSION: 1. Subcentimeter acute or early subacute infarct within the right pons, in  keeping with reported left-sided weakness. 2. No acute hemorrhage, mass effect or hydrocephalus. Electronically Signed   By: Ulyses Jarred M.D.   On: 01/16/2018 01:48    EKG: Independently reviewed. Sinus rhythm, LAD.   Assessment/Plan   1. Acute ischemic CVA  - Presents with left-sided weakness and dysarthria, present for at least 24 hours  - MRI reveals acute or early subacute infarct within the left pons without hemorrhage or mass-effect  - Neurology is consulting and much appreciated, will follow up on recommendations   - She passed swallow screen in ED  - Pending neuro recs, will plan to continue cardiac monitoring, frequent neuro checks, PT/OT consultations, check MRA head, carotid US, fasting lipids, and A1c, and continue ASA and statin    2. Type II DM  - A1c was 6.5% in May 2018  - Managed at home with Janumet, held on admission  - Follow CBG's and use a low-intensity SSI with Novolog while in hospital    3. Hypertension  - BP at goal  - Hold antihypertensives in acute-phase of ischemic CVA    4. CAD  - No anginal complaints  - Continue ASA and statin, hold ARB and beta-blocker in acute-phase ischemic CVA     DVT prophylaxis: Lovenox Code Status: Full  Family Communication: Family updated at bedside Consults called: Neurology Admission  status: Observation     Vianne Bulls, MD [facility] Pager [phone]  If 7PM-7AM, please contact night-coverage [email or web address] Password [email or web address]  01/16/2018, 2:44 AM

## 2018-01-16 NOTE — Evaluation (Signed)
Occupational Therapy Evaluation Patient Details Name: Leah Norton MRN: [id number] DOB: [birth year] Today's Date: 01/16/2018    History of Present Illness Pt is a 62 y.o female with a PMH consisting of CAD, HTN, R ankle arthritis  and DM presents to the ED with L sided weakness and slurred speech. The initial symptoms began on 01/14/2108 with rhinorrhea, progressing to decreased balance later in the day and eventually L arm/leg weakness as well as slurred speech on 01/14/2018. Pt continued to go to work, and reported to ED on 01/16/2017. 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    Clinical Impression   This 62 yo female admitted with above with PLOF of being totally independent with basic ADLs, IADLs, and working presents to acute OT at a minguard A level for basic ADLs when up on her feet with mild ataxic LUE--she will benefit from acute OT with follow up HHOT and intermittent S.    Follow Up Recommendations  Home health OT;Supervision - Intermittent    Equipment Recommendations  None recommended by OT       Precautions / Restrictions Precautions Precautions: Fall Restrictions Weight Bearing Restrictions: No      Mobility Bed Mobility               General bed mobility comments: Pt sitting EOB upon my arrival  Transfers Overall transfer level: Needs assistance Equipment used: None Transfers: Sit to/from Stand Sit to Stand: Min guard              Balance Overall balance assessment: Needs assistance Sitting-balance support: Feet supported;No upper extremity supported Sitting balance-Leahy Scale: Good     Standing balance support: No upper extremity supported;During functional activity Standing balance-Leahy Scale: Fair Standing balance comment: standing at sink to groom                           ADL either performed or assessed with clinical judgement   ADL Overall ADL's : Needs  assistance/impaired Eating/Feeding: Independent;Sitting Eating/Feeding Details (indicate cue type and reason): Did educate pt that she should not be using sharp knives to cut up items Grooming: Standing;Wash/dry face;Oral care;Wash/dry hands;Min guard   Upper Body Bathing: Set up;Supervision/ safety;Sitting   Lower Body Bathing: Min guard;Sit to/from stand   Upper Body Dressing : Set up;Supervision/safety;Sitting   Lower Body Dressing: Min guard;Sit to/from stand   Toilet Transfer: Min guard;Ambulation;Regular Toilet;Grab bars   Toileting- Clothing Manipulation and Hygiene: Min guard;Sit to/from stand   Tub/ Shower Transfer: Minimal assistance;Ambulation Tub/Shower Transfer Details (indicate cue type and reason): holding onto wall to step into tub and holding onto my arm to step out   General ADL Comments: Educated on not trying to hold kitchen items that are heavy and need two hands to hold (especially if hot and/or glass); nor using LUE to hold hot or glass items as well     Vision Baseline Vision/History: Wears glasses Wears Glasses: At all times Patient Visual Report: No change from baseline Vision Assessment?: Yes Eye Alignment: Within Functional Limits Ocular Range of Motion: Within Functional Limits Alignment/Gaze Preference: Within Defined Limits Tracking/Visual Pursuits: Able to track stimulus in all quads without difficulty Saccades: Additional eye shifts occurred during testing Visual Fields: No apparent deficits            Pertinent Vitals/Pain Pain Assessment: No/denies pain     Hand Dominance Right   Extremity/Trunk Assessment Upper Extremity Assessment Upper  Extremity Assessment: LUE deficits/detail LUE Deficits / Details: Mild ataxic finger to nose, noted some decreased proprioception for with adjusting clothing for toileting LUE Sensation: decreased proprioception LUE Coordination: decreased gross motor           Communication  Communication Communication: No difficulties   Cognition Arousal/Alertness: Awake/alert Behavior During Therapy: WFL for tasks assessed/performed Overall Cognitive Status: Within Functional Limits for tasks assessed                                                Home Living Family/patient expects to be discharged to:: Private residence Living Arrangements: Alone Available Help at Discharge: Family;Friend(s);Available PRN/intermittently Type of Home: House Home Access: Stairs to enter [employer] of Steps: 3 Entrance Stairs-Rails: None Home Layout: One level     Bathroom Shower/Tub: Teacher, early years/pre: Standard Bathroom Accessibility: Yes   Home Equipment: Cane - single point          Prior Functioning/Environment Level of Independence: Independent        Comments: Pt works as Insurance underwriter at [employer]. Continued to work during her stroke symptoms.         OT Problem List: Impaired UE functional use;Impaired balance (sitting and/or standing)      OT Treatment/Interventions: Self-care/ADL training;Balance training;DME and/or AE instruction;Patient/family education;Therapeutic activities;Therapeutic exercise    OT Goals(Current goals can be found in the care plan section) Acute Rehab OT Goals Patient Stated Goal: Return to work  OT Goal Formulation: With patient Time For Goal Achievement: 01/30/18 Potential to Achieve Goals: Good  OT Frequency: Min 3X/week   Barriers to D/C: Decreased caregiver support             AM-PAC PT "6 Clicks" Daily Activity     Outcome Measure Help from another person eating meals?: None Help from another person taking care of personal grooming?: A Little Help from another person toileting, which includes using toliet, bedpan, or urinal?: A Little Help from another person bathing (including washing, rinsing, drying)?: A Little Help from another person to put on and taking off  regular upper body clothing?: A Little Help from another person to put on and taking off regular lower body clothing?: A Little 6 Click Score: 19   End of Session Equipment Utilized During Treatment: Gait belt  Activity Tolerance: Patient tolerated treatment well Patient left: with bed alarm set(sitting EOB)  OT Visit Diagnosis: Unsteadiness on feet (R26.81);Ataxia, unspecified (R27.0)                Time: 1429-1501 OT Time Calculation (min): 32 min Charges:  OT General Charges $OT Visit: 1 Visit OT Evaluation $OT Eval Moderate Complexity: 1 Mod OT Treatments $Self Care/Home Management : 8-22 mins  [address], OTR/L Acute [employer] Pager [phone] Office [phone]    Almon Register 01/16/2018, 5:40 PM

## 2018-01-16 NOTE — ED Notes (Signed)
Patient transported to MRI 

## 2018-01-17 DIAGNOSIS — I519 Heart disease, unspecified: Secondary | ICD-10-CM

## 2018-01-17 LAB — GLUCOSE, CAPILLARY
GLUCOSE-CAPILLARY: 170 mg/dL — AB (ref 70–99)
GLUCOSE-CAPILLARY: 198 mg/dL — AB (ref 70–99)
Glucose-Capillary: 143 mg/dL — ABNORMAL HIGH (ref 70–99)
Glucose-Capillary: 139 mg/dL — ABNORMAL HIGH (ref 70–99)

## 2018-01-17 NOTE — Progress Notes (Addendum)
PROGRESS NOTE  Leah Norton UYQ:[id number] DOB: 07-02-1955 DOA: 01/15/2018 PCP: Seward Carol, MD  HPI/Recap of past 24 hours: Leah Norton a 62 y.o.femalewith medical history significant forcoronary artery disease with stent, hypertension, and type 2 diabetes mellitus, presenting to the emergency department for evaluation of slurred speech and left-sided weakness. Patient reports that she developed some rhinorrhea early in the a.m. of 01/13/2018, took a Benadryl for this, later noted some difficulty with balance that she suspected was adverse effect of Benadryl, but then at ~7 PM on 01/14/2018, noted weakness involving the left arm and leg as well as slurred speech. Symptoms have failed to improve and so she now comes into the ED for evaluation. She denies any recent fevers, chills, chest pain, or palpitations. She denies headache or change in vision or hearing.  01/16/2018: Patient seen and examined with her sister and niece at bedside.  On exam noted left facial droop.  Speech was fluent.  Reviewed MRI with patient in the room which revealed acute or early subacute infarct within the right pons.  01/17/2018: Patient seen and examined at her bedside.  No acute events overnight.  Dysarthria is persistent.  Was assessed by physical therapy who recommended outpatient physical therapy and rolling walker 5 inches wheels.  Speech therapy also assessed and recommended outpatient speech therapy.  She has no new complaints.  Assessment/Plan: Principal Problem:   Acute ischemic stroke (HCC) Active Problems:   DM (diabetes mellitus), type 2, uncontrolled with complications (HCC)   Essential hypertension   CAD S/P percutaneous coronary angioplasty   Acute CVA (cerebrovascular accident) ([place])  Acute nonhemorrhagic CVA right paramedian pontine Suspected secondary to small vessel disease Independently reviewed MRI brain which revealed acute or early subacute infarct within the right  pons 50-75% stenosis of the proximal right M1 MCA, noncontributory with respect to the acute ischemia Per neurology continue aspirin and Plavix x3 weeks then Plavix alone Follow-up with neurology outpatient  New systolic CHF Last echo revealed preserved LVEF Repeated 2D echo done during this admission revealed LVEF of 40 to 45% Cardiology consulted to further assess Followed by Dr. Tamala Julian outpatient  Coronary artery disease status post PCI with stenting in 2018 Continue dual antiplatelet Continue statin Follows outpatient with Dr. Tamala Julian  Hyperlipidemia Continue Crestor 10 mg daily  GERD Continue Protonix 40 mg daily  Type 2 diabetes with hyperglycemia Last hemoglobin A1c 6.6 on 01/16/2018 Continue insulin sliding scale  Ambulatory dysfunction post CVA Continue PT Home health PT OT and speech therapy Fall precautions  Risks: High risk for decompensation due to acute CVA, new systolic CHF, multiple comorbidities and advanced age.  Patient will require 2 midnights for further evaluation and treatment of present condition.    Code Status: Full code  Family Communication: None at bedside  Disposition Plan: Home when hemodynamically stable   Consultants:  Neurology  Procedures:  None  Antimicrobials:  None  DVT prophylaxis: Subcu Lovenox daily   Objective: Vitals:   01/17/18 0022 01/17/18 0432 01/17/18 0737 01/17/18 1153  BP: (!) 155/81 (!) 146/71 140/69 (!) 142/77  Pulse: 68 65 72 69  Resp: 18 18 20 20   Temp: 98.7 F (37.1 C) 98.3 F (36.8 C) 98.2 F (36.8 C) 98.2 F (36.8 C)  TempSrc: Oral Oral Oral Oral  SpO2: 99% 100% 100% 93%  Weight:      Height:        Intake/Output Summary (Last 24 hours) at 01/17/2018 1524 Last data filed at 01/16/2018 1700 Gross  per 24 hour  Intake 320 ml  Output -  Net 320 ml   Filed Weights   01/15/18 2319 01/16/18 0254  Weight: 81.6 kg 80.5 kg    Exam:  . General: 62 y.o. year-old female well developed well  nourished in no acute distress.  Alert and oriented x3.  Mild dysarthria noted during interview. . Cardiovascular: Regular rate and rhythm with no rubs or gallops.  No thyromegaly or JVD noted.   Marland Kitchen Respiratory: Clear to auscultation with no wheezes or rales. Good inspiratory effort. . Abdomen: Soft nontender nondistended with normal bowel sounds x4 quadrants. . Musculoskeletal: No lower extremity edema. 2/4 pulses in all 4 extremities. . Skin: No ulcerative lesions noted or rashes, . Psychiatry: Mood is appropriate for condition and setting   Data Reviewed: CBC: Recent Labs  Lab 01/16/18 0006 01/16/18 0009  WBC 7.8  --   NEUTROABS 5.2  --   HGB 11.4* 12.6  HCT 38.4 37.0  MCV 91.4  --   PLT 402*  --    Basic Metabolic Panel: Recent Labs  Lab 01/16/18 0006 01/16/18 0009  NA 140 141  K 3.5 3.5  CL 101 101  CO2 31  --   GLUCOSE 105* 104*  BUN 12 12  CREATININE 0.75 0.70  CALCIUM 10.4*  --    GFR: Estimated Creatinine Clearance: 76.4 mL/min (by C-G formula based on SCr of 0.7 mg/dL). Liver Function Tests: Recent Labs  Lab 01/16/18 0006  AST 27  ALT 25  ALKPHOS 80  BILITOT 0.4  PROT 7.7  ALBUMIN 4.0   No results for input(s): LIPASE, AMYLASE in the last 168 hours. No results for input(s): AMMONIA in the last 168 hours. Coagulation Profile: Recent Labs  Lab 01/16/18 0006  INR 0.95   Cardiac Enzymes: No results for input(s): CKTOTAL, CKMB, CKMBINDEX, TROPONINI in the last 168 hours. BNP (last 3 results) No results for input(s): PROBNP in the last 8760 hours. HbA1C: Recent Labs    01/16/18 0318  HGBA1C 6.6*   CBG: Recent Labs  Lab 01/16/18 1121 01/16/18 1620 01/16/18 2128 01/17/18 0646 01/17/18 1151  GLUCAP 141* 166* 137* 143* 139*   Lipid Profile: Recent Labs    01/16/18 0318  CHOL 153  HDL 42  LDLCALC 77  TRIG 169*  CHOLHDL 3.6   Thyroid Function Tests: No results for input(s): TSH, T4TOTAL, FREET4, T3FREE, THYROIDAB in the last 72  hours. Anemia Panel: No results for input(s): VITAMINB12, FOLATE, FERRITIN, TIBC, IRON, RETICCTPCT in the last 72 hours. Urine analysis:    Component Value Date/Time   COLORURINE YELLOW 01/16/2018 0051   APPEARANCEUR CLEAR 01/16/2018 0051   LABSPEC 1.011 01/16/2018 0051   PHURINE 5.0 01/16/2018 0051   GLUCOSEU NEGATIVE 01/16/2018 0051   HGBUR NEGATIVE 01/16/2018 0051   HGBUR negative 03/24/2008 0943   BILIRUBINUR NEGATIVE 01/16/2018 0051   KETONESUR NEGATIVE 01/16/2018 0051   PROTEINUR NEGATIVE 01/16/2018 0051   UROBILINOGEN 0.2 04/03/2011 2202   NITRITE NEGATIVE 01/16/2018 0051   LEUKOCYTESUR NEGATIVE 01/16/2018 0051   Sepsis Labs: @LABRCNTIP (procalcitonin:4,lacticidven:4)  )No results found for this or any previous visit (from the past 240 hour(s)).    Studies: No results found.  Scheduled Meds: . aspirin EC  81 mg Oral Daily  . buPROPion  150 mg Oral BID WC  . cholecalciferol  2,000 Units Oral Daily  . clopidogrel  75 mg Oral Daily  . enoxaparin (LOVENOX) injection  40 mg Subcutaneous Q24H  . insulin aspart  0-5 Units  Subcutaneous QHS  . insulin aspart  0-9 Units Subcutaneous TID WC  . pantoprazole  40 mg Oral Daily  . rosuvastatin  10 mg Oral q1800    Continuous Infusions:   LOS: 1 day     Kayleen Memos, MD [facility] Pager [phone]  If 7PM-7AM, please contact night-coverage [email or web address] Password [email or web address] 01/17/2018, 3:24 PM

## 2018-01-17 NOTE — Evaluation (Signed)
Speech Language Pathology Evaluation Patient Details Name: Leah Norton MRN: [id number] DOB: [birth year] Today's Date: 01/17/2018 Time: 7616-0737 SLP Time Calculation (min) (ACUTE ONLY): 43 min  Problem List:  Patient Active Problem List   Diagnosis Date Noted  . Acute ischemic stroke ([place]) 01/16/2018  . Acute CVA (cerebrovascular accident) ([place]) 01/16/2018  . GI bleed 07/10/2016  . Morbid obesity due to excess calories ([place]) 05/14/2016  . Dyspnea on exertion 04/29/2016  . Abnormal nuclear stress test   . CAD S/P percutaneous coronary angioplasty 02/08/2016  . INSOMNIA, CHRONIC 02/28/2010  . HEADACHE 02/28/2010  . HEEL PAIN, RIGHT 11/29/2009  . COUGH DUE TO ACE INHIBITORS 11/29/2009  . ATRIAL ENLARGEMENT, LEFT 03/21/2009  . ANEMIA 12/28/2008  . CARDIAC MURMUR 12/28/2008  . OTHER NONSPECIFIC ABNORMAL SERUM ENZYME LEVELS 12/28/2008  . CERVICAL MUSCLE STRAIN 12/28/2008  . CONSTIPATION 06/01/2008  . POSTMENOPAUSAL STATUS 05/15/2008  . BRONCHITIS, CHRONIC 12/18/2007  . Cigarette smoker 08/19/2007  . DEGENERATIVE DISC DISEASE, LUMBOSACRAL SPINE 08/19/2007  . LOW BACK PAIN SYNDROME 08/19/2007  . UPPER RESPIRATORY INFECTION, VIRAL 03/05/2007  . NEOPLASM, SKIN, UNCERTAIN BEHAVIOR 10/62/6948  . GANGLION CYST, WRIST, LEFT 01/28/2007  . DENTAL PAIN 01/19/2007  . DM (diabetes mellitus), type 2, uncontrolled with complications ([place]) 62/62/7035  . Hyperlipidemia with target LDL less than 70 12/10/2006  . Essential hypertension 12/10/2006  . RHINITIS, ALLERGIC NEC 12/10/2006  . HIP PAIN, RIGHT 12/10/2006   Past Medical History:  Past Medical History:  Diagnosis Date  . Anginal pain ([place])   . Arthritis    "right anikle" (02/29/2016)  . CAD in native artery 02/08/2016   Three-vessel coronary artery calcification on CT scan  . Complication of anesthesia    "had reaction w/hives, rash, itching" (02/29/2016)  . Constipation   . Coronary artery disease    12/17 PCI with DESx1 to  pLAD EF normal  . Enlarged heart   . GERD (gastroesophageal reflux disease)   . High cholesterol   . Hypertension   . Migraine    hx  . Symptomatic anemia 07/10/2016  . Type II diabetes mellitus ([place])    Past Surgical History:  Past Surgical History:  Procedure Laterality Date  . ABDOMINAL HYSTERECTOMY    . CARDIAC CATHETERIZATION N/A 02/29/2016   Procedure: Left Heart Cath and Coronary Angiography;  Surgeon: Belva Crome, MD;  Location: [facility];  Service: Cardiovascular;  Laterality: N/A;  . CARDIAC CATHETERIZATION N/A 02/29/2016   Procedure: Coronary Stent Intervention;  Surgeon: Belva Crome, MD;  Location: [facility];  Service: Cardiovascular;  Laterality: N/A;  . CARDIAC CATHETERIZATION  2005   "no blockages"  . COLONOSCOPY WITH PROPOFOL N/A 03/15/2014   Procedure: COLONOSCOPY WITH PROPOFOL;  Surgeon: Garlan Fair, MD;  Location: [facility];  Service: Endoscopy;  Laterality: N/A;  . CORONARY ANGIOPLASTY WITH STENT PLACEMENT    . ESOPHAGOGASTRODUODENOSCOPY N/A 07/11/2016   Procedure: ESOPHAGOGASTRODUODENOSCOPY (EGD);  Surgeon: Laurence Spates, MD;  Location: [facility];  Service: Endoscopy;  Laterality: N/A;  . FRACTURE SURGERY    . MULTIPLE TOOTH EXTRACTIONS     teeth removed  . ORIF ANKLE FRACTURE Right 11/12/2012   Procedure: OPEN REDUCTION INTERNAL FIXATION (ORIF) ANKLE FRACTURE;  Surgeon: Marianna Payment, MD;  Location: [facility];  Service: Orthopedics;  Laterality: Right;  Open reduction internal fixation ankle fracture, trimalleolar   HPI:  62 yo female adm with left weakness and speech deficits.  Pt found to have right pons early subacute or acute  cva.   Speech evaluation ordered.  Pt reports she had speech therapy in her childhood for stuttering and word finding.  She denies problems with these issues until this event.     Assessment / Plan / Recommendation Clinical Impression  MOCA 7.2 given to pt resulting in score of 21/30 indicative of mild  toward mod cognitive deficit.  Areas of difficulties included visuospatial skills, mental math and attention primarily.  Strengths included sentence repetition, memory, naming, and orientation.  Pt presents with facial nerve deficits results in left facial asymmetry and decreased strength.  Mild dysarthria apparent due to lingual discoordination.  Also pt demonstrates occasional initial sound repetitions and word finding deficits with novel speech and appears worse when stressed.  She admits to h/o stuttering and word finding deficits as a child but states these resolved with SLP treatment in school.  Recommend follow up SlP via [place] to help maximize pt's attention and speech intelligibility.  Skilled intervention including providing memory compensation strategies, environmental modifications and compensation for speech deficits using teach back.  Pt agreeable to [facility] SLP to address fluency, word finding and attention skills.      SLP Assessment  SLP Recommendation/Assessment: All further Speech Lanaguage Pathology  needs can be addressed in the next venue of care SLP Visit Diagnosis: Dysarthria and anarthria (R47.1);Attention and concentration deficit;Cognitive communication deficit (R41.841) Attention and concentration deficit following: Cerebral infarction    Follow Up Recommendations  Home health SLP    Frequency and Duration           SLP Evaluation Cognition  Overall Cognitive Status: Impaired/Different from baseline Arousal/Alertness: Awake/alert Orientation Level: Oriented X4 Attention: Sustained Sustained Attention: Impaired Sustained Attention Impairment: Verbal complex;Verbal basic(pt required repetition of questions) Memory: Impaired Memory Impairment: Retrieval deficit(recalled 3/5 words independently, 2/5 with cues) Awareness: Appears intact(aware of impaired sustained attention to tasks) Problem Solving: Appears intact Problem Solving Impairment: Functional basic;Verbal  complex(asked for help to sink to brush her gums and place dentures, mental math skills impaired due to decreased attention) Safety/Judgment: Appears intact Comments: concern for managing home duties including bills, etc due to decreased attention, concerns re: communication requirements of her job given her speech deficits       Comprehension  Auditory Comprehension Overall Auditory Comprehension: Appears within functional limits for tasks assessed Yes/No Questions: Not tested Commands: Within Functional Limits Conversation: Complex Interfering Components: Attention;Processing speed;Working Curator: Within [employer] Reading Comprehension Reading Status: Within funtional limits    Expression Expression Primary Mode of Expression: Verbal Verbal Expression Overall Verbal Expression: Other (comment)(pt with stuttering and language deficits as a child, reported they resolved with therapy but have recurred) Initiation: Impaired Level of Generative/Spontaneous Verbalization: Phrase;Sentence Repetition: No impairment Naming: No impairment Pragmatics: No impairment Interfering Components: Attention Non-Verbal Means of Communication: Not applicable Other Verbal Expression Comments: naming words beginning with letter "s", 11 words named Written Expression Dominant Hand: Right Written Expression: Exceptions to [facility]   Oral / Motor  Oral Motor/Sensory Function Overall Oral Motor/Sensory Function: Mild impairment Facial ROM: Reduced left Facial Symmetry: Abnormal symmetry left Facial Strength: Reduced left Facial Sensation: Within Functional Limits Lingual Strength: Reduced Velum: Within Functional Limits Mandible: Within Functional Limits Motor Speech Overall Motor Speech: Impaired Respiration: Impaired Level of Impairment: Sentence Phonation: Low vocal intensity Resonance: Within functional limits Articulation:  Impaired Intelligibility: Intelligibility reduced Word: 75-100% accurate Phrase: 75-100% accurate Sentence: 50-74% accurate Motor Planning: Witnin functional limits Effective Techniques: Slow rate;Over-articulate   GO  Macario Golds 01/17/2018, 10:44 AM Luanna Salk, MS [facility] SLP Acute Rehab Services Pager [phone] Office [phone]

## 2018-01-17 NOTE — Care Management Note (Signed)
Case Management Note  Patient Details  Name: Leah Norton MRN: [id number] Date of Birth: 1955-08-14  Subjective/Objective:       Pt admitted with a stroke. She is from home alone.  DME: cane Pt denies issues obtaining her medications.  Pt states her siblings can assist with transportation needs.              Action/Plan: Recommendations are for [facility] services. CM provided choice. Patient wanting to research the choices.  MD please place [place] orders.  Pt with orders for a walker. James with [facility] notified and will deliver to the room. Pt states her family can provide supervision at home and transport to home.  Expected Discharge Date:                  Expected Discharge Plan:  [place]  In-House Referral:     Discharge planning Services  CM Consult  Post Acute Care Choice:  Home Health Choice offered to:     DME Arranged:    DME Agency:     HH Arranged:    [place] Agency:     Status of Service:  In process, will continue to follow  If discussed at Long Length of Stay Meetings, dates discussed:    Additional Comments:  Pollie Friar, RN 01/17/2018, 2:06 PM

## 2018-01-17 NOTE — Consult Note (Addendum)
Cardiology Consultation:   Patient ID: GLENETTA KIGER MRN: [id number]; DOB: 09-23-1955  Admit date: 01/15/2018 Date of Consult: 01/17/2018  Primary Care Provider: Seward Carol, MD Primary Cardiologist: Sinclair Grooms, MD  Primary Electrophysiologist:  None   Patient Profile:   KALIYAH GLADMAN is a 62 y.o. female with a hx of CAD s/p LAD PCI in 2017, DM, HTN, and HLD, admitted for acute CVA and found to have reduced LVEF on echo. She is being seen today for the evaluation of reduced EF, at the request of Dr. Nevada Crane, Internal Medicine.   History of Present Illness:   Ms. Karapetian  is a 63 y.o. female with history of diabetes, hyper-lipidemia, hypertension, tobacco abuse, nonobstructive coronary disease by angiography in 2005. Nuclear study in 2017 demonstrated evidence of ischemia. Subsequent catheterization 02/29/16 demonstrated 75-85% proximal LAD which was stented with DES. There was residual disease in other branches, 65% in mLCx, 50% in first diag, and 70% in ramus. Medical therapy recommended. Brilinta caused dyspnea and has been discontinued with substitution of Plavix. She had an echo 03/2916 that showed normal LVEF at 50-55%. No WMAs. She is followed by Dr. Tamala Julian. Last OV was 04/2017. She was doing well from a cardiac standpoint. No angina. Plavix was discontinued and she was continued on ASA and statin therapy. 1 yr f/u recommended.   She presented to [facility] on 01/15/18 with complaint of left sided weakness and slurred speech. RI brain reveals subcentimeter acute or early subacute infarction within the right pons, but no hemorrhage or mass-effect. Admitted by IM for CVA and seen by neurology. Plavix added back to regimen, along with ASA. Statin continued. As part of w/u, 2D echo was conducted and shows mildly reduced LVEF, now at 40-45% (previously 50-55%). Diffuse hypokinesis noted. There is moderate hypokinesis of the inferior myocardium and G2DD. Lipid panel shows controlled LDL at 77  bpm. Hgb A1c 6.6. Cardiology consulted for reduce EF.   Pt notes that she had an episode of mild CP last night while in bed that did not last very long. She thinks it was anxiety. No current CP. She also denies any exertional CP and dyspnea.   Past Medical History:  Diagnosis Date  . Anginal pain ([place])   . Arthritis    "right anikle" (02/29/2016)  . CAD in native artery 02/08/2016   Three-vessel coronary artery calcification on CT scan  . Complication of anesthesia    "had reaction w/hives, rash, itching" (02/29/2016)  . Constipation   . Coronary artery disease    12/17 PCI with DESx1 to pLAD EF normal  . Enlarged heart   . GERD (gastroesophageal reflux disease)   . High cholesterol   . Hypertension   . Migraine    hx  . Symptomatic anemia 07/10/2016  . Type II diabetes mellitus ([place])     Past Surgical History:  Procedure Laterality Date  . ABDOMINAL HYSTERECTOMY    . CARDIAC CATHETERIZATION N/A 02/29/2016   Procedure: Left Heart Cath and Coronary Angiography;  Surgeon: Belva Crome, MD;  Location: [place] [facility];  Service: Cardiovascular;  Laterality: N/A;  . CARDIAC CATHETERIZATION N/A 02/29/2016   Procedure: Coronary Stent Intervention;  Surgeon: Belva Crome, MD;  Location: [place] [facility];  Service: Cardiovascular;  Laterality: N/A;  . CARDIAC CATHETERIZATION  2005   "no blockages"  . COLONOSCOPY WITH PROPOFOL N/A 03/15/2014   Procedure: COLONOSCOPY WITH PROPOFOL;  Surgeon: Garlan Fair, MD;  Location: [facility] ENDOSCOPY;  Service:  Endoscopy;  Laterality: N/A;  . CORONARY ANGIOPLASTY WITH STENT PLACEMENT    . ESOPHAGOGASTRODUODENOSCOPY N/A 07/11/2016   Procedure: ESOPHAGOGASTRODUODENOSCOPY (EGD);  Surgeon: Laurence Spates, MD;  Location: [facility] ENDOSCOPY;  Service: Endoscopy;  Laterality: N/A;  . FRACTURE SURGERY    . MULTIPLE TOOTH EXTRACTIONS     teeth removed  . ORIF ANKLE FRACTURE Right 11/12/2012   Procedure: OPEN REDUCTION INTERNAL FIXATION (ORIF) ANKLE FRACTURE;   Surgeon: Marianna Payment, MD;  Location: [place];  Service: Orthopedics;  Laterality: Right;  Open reduction internal fixation ankle fracture, trimalleolar     Home Medications:  Prior to Admission medications   Medication Sig Start Date End Date Taking? Authorizing Provider  aspirin EC 81 MG tablet Take 81 mg by mouth daily.   Yes [provider]  atenolol (TENORMIN) 50 MG tablet Take 50 mg by mouth daily.    Yes [provider]  buPROPion (WELLBUTRIN SR) 150 MG 12 hr tablet Take 150 mg by mouth 2 (two) times daily.    Yes [provider]  Cholecalciferol (VITAMIN D3) 1000 units CAPS Take 2,000 Units by mouth daily.   Yes [provider]  Cod Liver Oil CAPS Take 1 capsule by mouth once a week. One to two times a day    Yes [provider]  hydrochlorothiazide (HYDRODIURIL) 25 MG tablet Take 25 mg by mouth every morning.    Yes [provider]  losartan (COZAAR) 100 MG tablet TAKE ONE TABLET BY MOUTH IN THE MORNING Patient taking differently: Take 100 mg by mouth daily.  12/28/16  Yes Belva Crome, MD  nitroGLYCERIN (NITROSTAT) 0.4 MG SL tablet Place 0.4 mg under the tongue every 5 (five) minutes as needed for chest pain (Call 911 at 3rd dose within 15 minutes.).   Yes [provider]  pantoprazole (PROTONIX) 40 MG tablet TAKE 1 TABLET BY MOUTH ONCE DAILY Patient taking differently: Take 40 mg by mouth daily.  08/12/17  Yes Belva Crome, MD  rosuvastatin (CRESTOR) 10 MG tablet Take 10 mg by mouth daily.   Yes [provider]  sitaGLIPtin-metformin (JANUMET) 50-1000 MG per tablet Take 1 tablet by mouth 2 (two) times daily with a meal.   Yes [provider]    Inpatient Medications: Scheduled Meds: . aspirin EC  81 mg Oral Daily  . buPROPion  150 mg Oral BID WC  . cholecalciferol  2,000 Units Oral Daily  . clopidogrel  75 mg Oral Daily  . enoxaparin (LOVENOX) injection  40 mg Subcutaneous Q24H  . insulin  aspart  0-5 Units Subcutaneous QHS  . insulin aspart  0-9 Units Subcutaneous TID WC  . pantoprazole  40 mg Oral Daily  . rosuvastatin  10 mg Oral q1800   Continuous Infusions:  PRN Meds: acetaminophen, LORazepam, senna-docusate  Allergies:    Allergies  Allergen Reactions  . Codeine Rash  . Erythromycin Rash  . Penicillins Rash    Has patient had a PCN reaction causing immediate rash, facial/tongue/throat swelling, SOB or lightheadedness with hypotension: Yes Has patient had a PCN reaction causing severe rash involving mucus membranes or skin necrosis: No Has patient had a PCN reaction that required hospitalization: No Has patient had a PCN reaction occurring within the last 10 years: Yes If all of the above answers are "NO", then may proceed with Cephalosporin use.   . Sulfonamide Derivatives Rash  . Vicodin [Hydrocodone-Acetaminophen] Rash    Social History:   Social History   Socioeconomic History  .  Marital status: Single    Spouse name: Not on file  . Number of children: Not on file  . Years of education: Not on file  . Highest education level: Not on file  Occupational History  . Not on file  Social Needs  . Financial resource strain: Not on file  . Food insecurity:    Worry: Not on file    Inability: Not on file  . Transportation needs:    Medical: Not on file    Non-medical: Not on file  Tobacco Use  . Smoking status: Current Every Day Smoker    Packs/day: 0.50    Years: 42.00    Pack years: 21.00    Types: Cigarettes  . Smokeless tobacco: Never Used  Substance and Sexual Activity  . Alcohol use: Yes    Comment: occasionally  . Drug use: No  . Sexual activity: Not on file  Lifestyle  . Physical activity:    Days per week: Not on file    Minutes per session: Not on file  . Stress: Not on file  Relationships  . Social connections:    Talks on phone: Not on file    Gets together: Not on file    Attends religious service: Not on file    Active  member of club or organization: Not on file    Attends meetings of clubs or organizations: Not on file    Relationship status: Not on file  . Intimate partner violence:    Fear of current or ex partner: Not on file    Emotionally abused: Not on file    Physically abused: Not on file    Forced sexual activity: Not on file  Other Topics Concern  . Not on file  Social History Narrative  . Not on file    Family History:    Family History  Problem Relation Age of Onset  . Hypertension Mother      ROS:  Please see the history of present illness.   All other ROS reviewed and negative.     Physical Exam/Data:   Vitals:   01/17/18 0022 01/17/18 0432 01/17/18 0737 01/17/18 1153  BP: (!) 155/81 (!) 146/71 140/69 (!) 142/77  Pulse: 68 65 72 69  Resp: 18 18 20 20   Temp: 98.7 F (37.1 C) 98.3 F (36.8 C) 98.2 F (36.8 C) 98.2 F (36.8 C)  TempSrc: Oral Oral Oral Oral  SpO2: 99% 100% 100% 93%  Weight:      Height:        Intake/Output Summary (Last 24 hours) at 01/17/2018 1505 Last data filed at 01/16/2018 1700 Gross per 24 hour  Intake 320 ml  Output -  Net 320 ml   Filed Weights   01/15/18 2319 01/16/18 0254  Weight: 81.6 kg 80.5 kg   Body mass index is 29.53 kg/m.  General:  Middle aged BF, Well nourished, well developed, in no acute distress HEENT: normal Lymph: no adenopathy Neck: no JVD Endocrine:  No thryomegaly Vascular: No carotid bruits; FA pulses 2+ bilaterally without bruits  Cardiac:  normal S1, S2; RRR; no murmur  Lungs:  clear to auscultation bilaterally, no wheezing, rhonchi or rales  Abd: soft, nontender, no hepatomegaly  Ext: no edema Musculoskeletal:  No deformities, BUE and BLE strength normal and equal Skin: warm and dry  Neuro:  Dysphasia, left sided extremity weakness Psych:  Normal affect   EKG:  The EKG was personally reviewed and demonstrates:  NSR, no change since  previous tracing Telemetry:  Telemetry was personally reviewed and  demonstrates:  NSR. No afib.   Relevant CV Studies: [facility] 02/28/18 Procedures   Coronary Stent Intervention  Left Heart Cath and Coronary Angiography  Conclusion    Coronary artery disease with eccentric 70-80% proximal LAD stenosis. Noncritical 65% mid circumflex, 50% first diagonal, and 70% ramus intermedius.  Abnormal intermediate risk myocardial perfusion study with anteroapical perfusion abnormality car laced with the proximal LAD stenosis.  Successful stenting of the proximal LAD from 75% to 0% with TIMI grade 3 flow using a 16 x 3.5 Promus Premier post dilated to 3.75 mm in diameter.  Normal LV function with EF 60%. Mild elevation and end-diastolic pressure at 19 mmHg.  RECOMMENDATIONS:   Increase the intensity of statin therapy.  Aspirin and Brilinta for at least 6 months.  Risk factor modification.  Anticipate discharge in a.m. if no groin complications.     Laboratory Data:  Chemistry Recent Labs  Lab 01/16/18 0006 01/16/18 0009  NA 140 141  K 3.5 3.5  CL 101 101  CO2 31  --   GLUCOSE 105* 104*  BUN 12 12  CREATININE 0.75 0.70  CALCIUM 10.4*  --   GFRNONAA >60  --   GFRAA >60  --   ANIONGAP 8  --     Recent Labs  Lab 01/16/18 0006  PROT 7.7  ALBUMIN 4.0  AST 27  ALT 25  ALKPHOS 80  BILITOT 0.4   Hematology Recent Labs  Lab 01/16/18 0006 01/16/18 0009  WBC 7.8  --   RBC 4.20  --   HGB 11.4* 12.6  HCT 38.4 37.0  MCV 91.4  --   MCH 27.1  --   MCHC 29.7*  --   RDW 14.3  --   PLT 402*  --    Cardiac EnzymesNo results for input(s): TROPONINI in the last 168 hours.  Recent Labs  Lab 01/16/18 0008  TROPIPOC 0.00    BNPNo results for input(s): BNP, PROBNP in the last 168 hours.  DDimer No results for input(s): DDIMER in the last 168 hours.  Radiology/Studies:  Ct Angio Head W Or Wo Contrast  Result Date: 01/16/2018 CLINICAL DATA:  Follow-up stroke. EXAM: CT ANGIOGRAPHY HEAD AND NECK TECHNIQUE: Multidetector CT imaging of the  head and neck was performed using the standard protocol during bolus administration of intravenous contrast. Multiplanar CT image reconstructions and MIPs were obtained to evaluate the vascular anatomy. Carotid stenosis measurements (when applicable) are obtained utilizing NASCET criteria, using the distal internal carotid diameter as the denominator. CONTRAST:  50mL ISOVUE-370 IOPAMIDOL (ISOVUE-370) INJECTION 76% COMPARISON:  MR brain earlier today. FINDINGS: CT HEAD FINDINGS Brain: No evidence for acute infarction, hemorrhage, mass lesion, hydrocephalus, or extra-axial fluid. Slight premature for age atrophy. No significant white matter disease. Cytotoxic edema within the acute RIGHT paramedian pontine infarct is not visible on CT. Vascular: Reported separately Skull: Intact. Sinuses: Clear Orbits: Unremarkable. Review of the MIP images confirms the above findings CTA NECK FINDINGS Aortic arch: Bovine trunk, common origin innominate and LEFT subclavian. Aberrant origin RIGHT subclavian. Imaged portion shows no evidence of aneurysm or dissection. No significant stenosis of the major arch vessel origins. Right carotid system: No evidence of dissection, stenosis (50% or greater) or occlusion. Minor atheromatous change at the bifurcation. Left carotid system: No evidence of dissection, stenosis (50% or greater) or occlusion. Minor atheromatous change the bifurcation. Vertebral arteries: Codominant. No evidence of dissection, stenosis (50% or greater) or occlusion. Skeleton:  Spondylosis. Other neck: No masses. Upper chest: No pneumothorax or consolidation. Review of the MIP images confirms the above findings CTA HEAD FINDINGS Anterior circulation: Calcification of the cavernous internal carotid arteries consistent with cerebrovascular atherosclerotic disease. No signs of intracranial large vessel occlusion. 50-75% stenosis at the origin RIGHT M1 MCA from the internal carotid artery. No similar findings on the LEFT.  Codominant anterior cerebral arteries. No MCA M2 or M3 branch occlusion. Posterior circulation: Minor atheromatous change LEFT V4 vertebral. Both vertebrals contribute to formation of the basilar. 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 Slight hypoplasia due to fetal LEFT PCA. Venous sinuses: As permitted by contrast timing, patent. Anatomic variants: LEFT fetal PCA. Delayed phase: No abnormal intracranial enhancement. Review of the MIP images confirms the above findings IMPRESSION: RIGHT paramedian brainstem infarct is not visible on pre or postcontrast CT imaging. No visible hemorrhagic transformation. No posterior circulation/basilar artery stenosis or dissection is observed. Minor calcific atheromatous change of the BILATERAL cavernous carotid arteries. 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 Electronically Signed   By: Staci Righter M.D.   On: 01/16/2018 11:19   Ct Angio Neck W Or Wo Contrast  Result Date: 01/16/2018 CLINICAL DATA:  Follow-up stroke. EXAM: CT ANGIOGRAPHY HEAD AND NECK TECHNIQUE: Multidetector CT imaging of the head and neck was performed using the standard protocol during bolus administration of intravenous contrast. Multiplanar CT image reconstructions and MIPs were obtained to evaluate the vascular anatomy. Carotid stenosis measurements (when applicable) are obtained utilizing NASCET criteria, using the distal internal carotid diameter as the denominator. CONTRAST:  78mL ISOVUE-370 IOPAMIDOL (ISOVUE-370) INJECTION 76% COMPARISON:  MR brain earlier today. FINDINGS: CT HEAD FINDINGS Brain: No evidence for acute infarction, hemorrhage, mass lesion, hydrocephalus, or extra-axial fluid. Slight premature for age atrophy. No significant white matter disease. Cytotoxic edema within the acute RIGHT paramedian pontine infarct is not  visible on CT. Vascular: Reported separately Skull: Intact. Sinuses: Clear Orbits: Unremarkable. Review of the MIP images confirms the above findings CTA NECK FINDINGS Aortic arch: Bovine trunk, common origin innominate and LEFT subclavian. Aberrant origin RIGHT subclavian. Imaged portion shows no evidence of aneurysm or dissection. No significant stenosis of the major arch vessel origins. Right carotid system: No evidence of dissection, stenosis (50% or greater) or occlusion. Minor atheromatous change at the bifurcation. Left carotid system: No evidence of dissection, stenosis (50% or greater) or occlusion. Minor atheromatous change the bifurcation. Vertebral arteries: Codominant. No evidence of dissection, stenosis (50% or greater) or occlusion. Skeleton: Spondylosis. Other neck: No masses. Upper chest: No pneumothorax or consolidation. Review of the MIP images confirms the above findings CTA HEAD FINDINGS Anterior circulation: Calcification of the cavernous internal carotid arteries consistent with cerebrovascular atherosclerotic disease. No signs of intracranial large vessel occlusion. 50-75% stenosis at the origin RIGHT M1 MCA from the internal carotid artery. No similar findings on the LEFT. Codominant anterior cerebral arteries. No MCA M2 or M3 branch occlusion. Posterior circulation: Minor atheromatous change LEFT V4 vertebral. Both vertebrals contribute to formation of the basilar. 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 Slight hypoplasia due to fetal LEFT PCA. Venous sinuses: As permitted by contrast timing, patent. Anatomic variants: LEFT fetal PCA. Delayed phase: No abnormal intracranial enhancement. Review of the MIP images confirms the above findings IMPRESSION: RIGHT paramedian brainstem infarct is not visible on pre or postcontrast CT imaging. No visible hemorrhagic transformation.  No posterior circulation/basilar artery stenosis or dissection is observed. Minor calcific  atheromatous change of the BILATERAL cavernous carotid arteries. 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 Electronically Signed   By: Staci Righter M.D.   On: 01/16/2018 11:19   Mr Brain Wo Contrast  Result Date: 01/16/2018 CLINICAL DATA:  Left-sided weakness, slurred speech and altered mental status EXAM: MRI HEAD WITHOUT CONTRAST TECHNIQUE: Multiplanar, multiecho pulse sequences of the brain and surrounding structures were obtained without intravenous contrast. COMPARISON:  None. FINDINGS: BRAIN: Acute infarct within the right pons measures 7 mm. The midline structures are normal. No midline shift or other mass effect. There are no old infarcts. Multifocal white matter hyperintensity, most commonly due to chronic ischemic microangiopathy. The cerebral and cerebellar volume are age-appropriate. Susceptibility-sensitive sequences show no chronic microhemorrhage or superficial siderosis. VASCULAR: Major intracranial arterial and venous sinus flow voids are normal. SKULL AND UPPER CERVICAL SPINE: Calvarial bone marrow signal is normal. There is no skull base mass. Visualized upper cervical spine and soft tissues are normal. SINUSES/ORBITS: No fluid levels or advanced mucosal thickening. No mastoid or middle ear effusion. The orbits are normal. IMPRESSION: 1. Subcentimeter acute or early subacute infarct within the right pons, in keeping with reported left-sided weakness. 2. No acute hemorrhage, mass effect or hydrocephalus. Electronically Signed   By: Ulyses Jarred M.D.   On: 01/16/2018 01:48    Assessment and Plan:   NKECHI LINEHAN is a 62 y.o. female with a hx of CAD s/p LAD PCI in 2017, DM, HTN, and HLD, admitted for acute CVA and found to have reduced LVEF on echo. She is being seen today for the evaluation of reduced EF, at the request of Dr. Nevada Crane, Internal Medicine.    1. Reduced LVEF: EF 40-45% on echo (previously 50-55% in 2018), diffuse hypokinesis noted w/ moderate hypokinesis inferiorlly. This is in the setting of CVA. She has known coronary disease with cath in 2017 showing  75-85% proximal LAD which was stented with DES. There was residual disease in other branches, 65% in mLCx, 50% in first diag, and 70% in ramus, treated medically. Her POC troponin in the ED yesterday was negative. No exertional CP or dyspnea. EKG shows NSR. No afib. She has continued to smoke since her stent placement in 2017. Volume appears stable. No immediate indication for cardiac cath. In the absence of symptoms, would recommend recovery from her CVA and outpatient f/u with Dr. Tamala Julian. He can decide on further ischemic testing after a period of recovery. For now, continue medical management. If BP allows, can treat w/ low dose ARB and BB.   2. CAD: per above, LAD PCI in 2017. Continue medical therapy. ASA, Plavix and statin. Given reduced EF, she would also benefit from BB and ARB.   3. CVA: management per neurology. No afib on tele thus far. ? TEE/Loop.   4. HTN: management per IM and neurology.  5. DM: management per IM.   6. HLD: LDL 77 mg/dL. She was previously on Crestor 10 mg. Recommend titration to 20 mg and target LDL goal <70 mg/dL given CAD and CVA.   7. Tobacco Abuse: smoking cessation encouraged. Pt acknowledges that she needs to quit.    For questions or updates, please contact [place] Please consult [email or web address] for contact info under     Signed, Lyda Jester, PA-C  01/17/2018 3:05 PM  I have examined the patient and reviewed assessment and plan and discussed with patient.  Agree with above as stated.  Appears stable and symptom free from a cardiac standpoint.  Will defer to neuro if they feel a TEE or loop monitor would be helpful for diagnostic purposes in the setting of CVA.    In terms of LV dysfunction, cath would be an appropriate next  step, but after she recovers from this CVA.  She can be seen by Dr. Tamala Julian as an outpatient and a decision can be made at that time. For now, continue aggressive secondary prevention.  She needs to stop smoking. Would resume ARB, followed by beta blocker, if ok from a BP standpoint in the setting of CVA.  Larae Grooms

## 2018-01-17 NOTE — Progress Notes (Signed)
Physical Therapy Treatment Patient Details Name: Leah Norton MRN: [id number] DOB: 1955/06/23 Today's Date: 01/17/2018    History of Present Illness Pt is a 62 y.o female with a PMH consisting of CAD, HTN, R ankle arthritis  and DM presents to the ED with L sided weakness and slurred speech. The initial symptoms began on 01/14/2108 with rhinorrhea, progressing to decreased balance later in the day and eventually L arm/leg weakness as well as slurred speech on 01/14/2018. Pt continued to go to work, and reported to ED on 01/16/2017. 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     PT Comments    Patient's tolerance to treatment today was good.  Patient was sitting in recliner chair with visitor present upon PT arrival.  Patient demonstrated step through pattern during ambulation to acute rehab gym with noted drifting right and left.  Treatment continued with patient ascending and descending stairs appearing unsteady without UE support from railing.  Therapist suggested use of single point cane while ascending/descending stairs at home for support since patient reported no available railing.  Patient required demonstration from therapist for proper use of cane but demonstrated poor coordination of feet during ambulation.  Patient required repeated verbal and tactile cueing for coordination and proper technique.  Treatment continued with dynamic standing balance activities in hallway with patient requiring support from railing and verbal cueing to maintain feet and trunk towards railing during side stepping.  Patient would continue to benefit from acute care PT in order to address balance and mobility deficits prior to recommended home health PT based on current functional status.  Follow Up Recommendations  Home health PT     Equipment Recommendations  Rolling walker with 5" wheels    Recommendations for Other Services OT consult     Precautions / Restrictions  Precautions Precautions: Fall Restrictions Weight Bearing Restrictions: No    Mobility  Bed Mobility                  Transfers Overall transfer level: Needs assistance Equipment used: None Transfers: Sit to/from Stand Sit to Stand: Supervision         General transfer comment: patient performed sit to stand from recliner only requiring supervision for safety.  Ambulation/Gait Ambulation/Gait assistance: Supervision Gait Distance (Feet): 150 Feet Assistive device: Straight cane Gait Pattern/deviations: Step-through pattern;Drifts right/left     General Gait Details: Patient initiated gait in hallway with no AD.  Patient demontrated step through pattern occasionally drifting to the right and left.  Therapist suggested use of single point cane during ambulation providing demonstration and education on proper technique.  Patient demonstrated poor coordination of feet with single point cane requiring repeated verbal and tactile cues for proper technique.   Stairs Stairs: Yes Stairs assistance: Min guard Stair Management: Forwards;Alternating pattern;With cane Number of Stairs: 8 General stair comments: Stair training was initiated with no AD and no rail use.  Patient appeared unsteady attempting stairs with no UE support.  Patient completed approximately 4 trials of two steps ascending and descending demonstrating alternating pattern with single point cane.  Pt required verbal cueing for sequencing and proper placement of cane and feet to ensure safety.   Wheelchair Mobility    Modified Rankin (Stroke Patients Only) Modified Rankin (Stroke Patients Only) Pre-Morbid Rankin Score: No symptoms Modified Rankin: Moderately severe disability     Balance Overall balance assessment: Needs assistance Sitting-balance support: Feet supported;No upper extremity supported Sitting balance-Leahy Scale: Good Sitting  balance - Comments: Pt able to maintain sitting balance at  supervision level.    Standing balance support: No upper extremity supported;During functional activity Standing balance-Leahy Scale: Fair Standing balance comment: able to stand without UE support but appears unsteady dynamically.                            Cognition Arousal/Alertness: Awake/alert Behavior During Therapy: WFL for tasks assessed/performed Overall Cognitive Status: Impaired/Different from baseline Area of Impairment: Safety/judgement;Problem solving                         Safety/Judgement: Decreased awareness of safety   Problem Solving: Slow processing;Difficulty sequencing;Requires verbal cues        Exercises General Exercises - Lower Extremity Hip Flexion/Marching: AROM;Both;10 reps;Standing Other Exercises Other Exercises: tandem walking with railing; 4 trials Other Exercises: side stepping with railing; 4 trials    General Comments General comments (skin integrity, edema, etc.): continues to require glasses for improved vision during ambulation      Pertinent Vitals/Pain Pain Assessment: No/denies pain    Home Living                      Prior Function            PT Goals (current goals can now be found in the care plan section) Acute Rehab PT Goals Patient Stated Goal: none stated PT Goal Formulation: With patient Time For Goal Achievement: 01/30/18 Potential to Achieve Goals: Good Progress towards PT goals: Progressing toward goals    Frequency    Min 4X/week      PT Plan      Co-evaluation              AM-PAC PT "6 Clicks" Daily Activity  Outcome Measure  Difficulty turning over in bed (including adjusting bedclothes, sheets and blankets)?: None Difficulty moving from lying on back to sitting on the side of the bed? : None Difficulty sitting down on and standing up from a chair with arms (e.g., wheelchair, bedside commode, etc,.)?: None Help needed moving to and from a bed to chair  (including a wheelchair)?: A Little Help needed walking in hospital room?: A Little Help needed climbing 3-5 steps with a railing? : A Little 6 Click Score: 21    End of Session Equipment Utilized During Treatment: Gait belt Activity Tolerance: Patient tolerated treatment well Patient left: in chair;with call bell/phone within reach;with chair alarm set;with family/visitor present Nurse Communication: Mobility status PT Visit Diagnosis: Unsteadiness on feet (R26.81);Other abnormalities of gait and mobility (R26.89);Muscle weakness (generalized) (M62.81)     Time: 0981-1914 PT Time Calculation (min) (ACUTE ONLY): 31 min  Charges:  $Gait Training: 8-22 mins $Neuromuscular Re-education: 8-22 mins                     [address] SPTA   Dakia Schifano 01/17/2018, 1:45 PM

## 2018-01-17 NOTE — Progress Notes (Signed)
Occupational Therapy Treatment Patient Details Name: Leah Norton MRN: [id number] DOB: 08/02/1955 Today's Date: 01/17/2018    History of present illness Pt is a 62 y.o female with a PMH consisting of CAD, HTN, R ankle arthritis  and DM presents to the ED with L sided weakness and slurred speech. The initial symptoms began on 01/14/2108 with rhinorrhea, progressing to decreased balance later in the day and eventually L arm/leg weakness as well as slurred speech on 01/14/2018. Pt continued to go to work, and reported to ED on 01/16/2017. 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    OT comments  This 62 yo female admitted with above presents to acute OT with less LUE ataxia today and using it functionally with Bil hand tasks (washing face, brushing teeth), still a little off balance when up on her feet but better with RW (encouraged her to use). She will continue to benefit from acute OT with follow up [place].   Follow Up Recommendations  Home health OT;Supervision - Intermittent    Equipment Recommendations  None recommended by OT       Precautions / Restrictions Precautions Precautions: Fall Restrictions Weight Bearing Restrictions: No       Mobility Bed Mobility               General bed mobility comments: Pt in recliner upon arrival  Transfers Overall transfer level: Needs assistance Equipment used: Rolling walker (2 wheeled) Transfers: Sit to/from Stand Sit to Stand: Supervision         General transfer comment: Pt ambulated 100 feet x2 (to gym and back) with RW with min guard A    Balance Overall balance assessment: Needs assistance Sitting-balance support: Feet supported;No upper extremity supported Sitting balance-Leahy Scale: Good   Standing balance support: No upper extremity supported;During functional activity Standing balance-Leahy Scale: Fair Standing balance comment: standing at sink for grooming                            ADL either performed or assessed with clinical judgement   ADL Overall ADL's : Needs assistance/impaired     Grooming: Wash/dry hands;Wash/dry face;Oral care;Standing;Supervision/safety;Set up                   Toilet Transfer: Ambulation;RW;Regular Toilet;Grab bars;Min guard   Toileting- Clothing Manipulation and Hygiene: Supervision/safety;Sit to/from stand   Tub/ Shower Transfer: Tub transfer;Min guard;Ambulation Tub/Shower Transfer Details (indicate cue type and reason): holding onto wall to step into tub and holding onto my arm to step out         Vision Baseline Vision/History: Wears glasses Wears Glasses: At all times Patient Visual Report: No change from baseline            Cognition Arousal/Alertness: Awake/alert Behavior During Therapy: WFL for tasks assessed/performed Overall Cognitive Status: Impaired/Different from baseline Area of Impairment: Safety/judgement;Problem solving                         Safety/Judgement: Decreased awareness of safety   Problem Solving: Slow processing;Difficulty sequencing;Requires verbal cues(for stepping into/out of tub and steps)          Exercises  Other Exercises Other Exercises: Pt with decreased ataxia for finger to nose today Other Exercises: side stepping with railing; 4 trials      General Comments continues to require glasses for improved vision during ambulation  Pertinent Vitals/ Pain       Pain Assessment: No/denies pain         Frequency  Min 3X/week        Progress Toward Goals  OT Goals(current goals can now be found in the care plan section)  Progress towards OT goals: Progressing toward goals  Acute Rehab OT Goals Patient Stated Goal: none stated  Plan Discharge plan remains appropriate       AM-PAC PT "6 Clicks" Daily Activity     Outcome Measure   Help from another person eating meals?: None Help from another person taking care of  personal grooming?: A Little Help from another person toileting, which includes using toliet, bedpan, or urinal?: A Little Help from another person bathing (including washing, rinsing, drying)?: A Little Help from another person to put on and taking off regular upper body clothing?: A Little Help from another person to put on and taking off regular lower body clothing?: A Little 6 Click Score: 19    End of Session Equipment Utilized During Treatment: Gait belt;Rolling walker  OT Visit Diagnosis: Unsteadiness on feet (R26.81);Ataxia, unspecified (R27.0)   Activity Tolerance Patient tolerated treatment well   Patient Left in chair;with call bell/phone within reach;with chair alarm set           Time: 1453-1516 OT Time Calculation (min): 23 min  Charges: OT General Charges $OT Visit: 1 Visit OT Treatments $Self Care/Home Management : 23-37 mins  [address], OTR/L Acute [employer] Pager [phone] Office [phone]      Almon Register 01/17/2018, 3:22 PM

## 2018-01-18 DIAGNOSIS — I639 Cerebral infarction, unspecified: Secondary | ICD-10-CM

## 2018-01-18 DIAGNOSIS — F17210 Nicotine dependence, cigarettes, uncomplicated: Secondary | ICD-10-CM

## 2018-01-18 LAB — GLUCOSE, CAPILLARY
GLUCOSE-CAPILLARY: 197 mg/dL — AB (ref 70–99)
Glucose-Capillary: 142 mg/dL — ABNORMAL HIGH (ref 70–99)

## 2018-01-18 MED ORDER — ASPIRIN EC 81 MG PO TBEC: 81.0000 mg | DELAYED_RELEASE_TABLET | Freq: Every day | ORAL | 0 refills | Status: AC

## 2018-01-18 MED ORDER — CLOPIDOGREL BISULFATE 75 MG PO TABS: 75.0000 mg | ORAL_TABLET | Freq: Every day | ORAL | 0 refills | Status: DC

## 2018-01-18 NOTE — Progress Notes (Signed)
Physical Therapy Treatment Patient Details Name: Leah Norton MRN: [id number] DOB: 02-29-1956 Today's Date: 01/18/2018    History of Present Illness Pt is a 62 y.o female with a PMH consisting of CAD, HTN, R ankle arthritis  and DM presents to the ED with L sided weakness and slurred speech. The initial symptoms began on 01/14/2108 with rhinorrhea, progressing to decreased balance later in the day and eventually L arm/leg weakness as well as slurred speech on 01/14/2018. Pt continued to go to work, and reported to ED on 01/16/2017. 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     PT Comments    Pt progressing towards physical therapy goals. Pt scored a 12/24 on DGI which indicates pt is at a high risk of falls. Encouraged RW use (DME to take home already delivered to room). Pt appears to have a decreased awareness of safety and her own deficits during session today. Discussed benefits of having friends/family stay with her at least the first night or 2 at d/c just to make sure she is safe in her home environment. Will continue to follow and progress as able per POC.   Follow Up Recommendations  Home health PT     Equipment Recommendations  Rolling walker with 5" wheels    Recommendations for Other Services       Precautions / Restrictions Precautions Precautions: Fall Restrictions Weight Bearing Restrictions: No    Mobility  Bed Mobility Overal bed mobility: Modified Independent             General bed mobility comments: Pt sitting up in bed in long sitting upon PT arrival. Pt was able to transition to EOB without difficulty and without assistance.   Transfers Overall transfer level: Needs assistance Equipment used: Rolling walker (2 wheeled) Transfers: Sit to/from Stand Sit to Stand: Supervision         General transfer comment: For safety. No assist required however appeared unsteady.  Ambulation/Gait Ambulation/Gait assistance:  Min guard Gait Distance (Feet): 300 Feet Assistive device: None Gait Pattern/deviations: Step-through pattern;Drifts right/left;Decreased step length - left Gait velocity: Decreased Gait velocity interpretation: 1.31 - 2.62 ft/sec, indicative of limited community ambulator General Gait Details: Initially without AD. Pt with uneven step/stride length and appears unsteady. Essentially unchanged with addition of SPC at end of gait training - pt reports no feeling of improved stability.    Stairs Stairs: Yes Stairs assistance: Min guard Stair Management: Forwards;Alternating pattern;With cane Number of Stairs: 5 General stair comments: Initially with rail use, then with SPC to simulate home environment. Min guard provided throughout.    Wheelchair Mobility    Modified Rankin (Stroke Patients Only) Modified Rankin (Stroke Patients Only) Pre-Morbid Rankin Score: No symptoms Modified Rankin: Moderately severe disability     Balance Overall balance assessment: Needs assistance Sitting-balance support: Feet supported;No upper extremity supported Sitting balance-Leahy Scale: Good Sitting balance - Comments: Pt able to maintain sitting balance at supervision level.    Standing balance support: No upper extremity supported;During functional activity Standing balance-Leahy Scale: Poor Standing balance comment: Leaning on sink with forearms to brush teeth                 Standardized Balance Assessment Standardized Balance Assessment : Dynamic Gait Index   Dynamic Gait Index Level Surface: Moderate Impairment Change in Gait Speed: Moderate Impairment Gait with Horizontal Head Turns: Mild Impairment Gait with Vertical Head Turns: Mild Impairment Gait and Pivot Turn: Mild Impairment Step Over  Obstacle: Moderate Impairment Step Around Obstacles: Moderate Impairment Steps: Mild Impairment Total Score: 12      Cognition Arousal/Alertness: Awake/alert Behavior During Therapy:  WFL for tasks assessed/performed Overall Cognitive Status: Impaired/Different from baseline Area of Impairment: Safety/judgement;Problem solving                         Safety/Judgement: Decreased awareness of safety;Decreased awareness of deficits   Problem Solving: Slow processing;Difficulty sequencing;Requires verbal cues General Comments: Pt reports she is "fine" and "back to normal". Appears to have decreased awareness of deficits.      Exercises      General Comments        Pertinent Vitals/Pain Pain Assessment: No/denies pain Pain Intervention(s): Monitored during session    Home Living                      Prior Function            PT Goals (current goals can now be found in the care plan section) Acute Rehab PT Goals Patient Stated Goal: none stated PT Goal Formulation: With patient Time For Goal Achievement: 01/30/18 Potential to Achieve Goals: Good Progress towards PT goals: Progressing toward goals    Frequency    Min 4X/week      PT Plan Current plan remains appropriate    Co-evaluation              AM-PAC PT "6 Clicks" Daily Activity  Outcome Measure  Difficulty turning over in bed (including adjusting bedclothes, sheets and blankets)?: None Difficulty moving from lying on back to sitting on the side of the bed? : None Difficulty sitting down on and standing up from a chair with arms (e.g., wheelchair, bedside commode, etc,.)?: None Help needed moving to and from a bed to chair (including a wheelchair)?: A Little Help needed walking in hospital room?: A Little Help needed climbing 3-5 steps with a railing? : A Little 6 Click Score: 21    End of Session Equipment Utilized During Treatment: Gait belt Activity Tolerance: Patient tolerated treatment well Patient left: in chair;with call bell/phone within reach;with chair alarm set Nurse Communication: Mobility status PT Visit Diagnosis: Unsteadiness on feet  (R26.81);Other abnormalities of gait and mobility (R26.89);Muscle weakness (generalized) (M62.81)     Time: 1027-2536 PT Time Calculation (min) (ACUTE ONLY): 21 min  Charges:  $Gait Training: 8-22 mins                     Rolinda Roan, PT, DPT Acute Rehabilitation Services Pager: [phone] Office: [phone]    Thelma Comp 01/18/2018, 12:35 PM

## 2018-01-18 NOTE — Discharge Summary (Addendum)
Discharge Summary  Leah Norton:[id number] DOB: August 31, 1955  PCP: Seward Carol, MD  Admit date: 01/15/2018 Discharge date: 01/18/2018  Time spent: 80mins, more than 50% time spent on coordination of care.  Recommendations for Outpatient Follow-up:  1. F/u with PCP within a week  for hospital discharge follow up, repeat cbc/bmp at follow up 2. F/u with cardiology Dr Tamala Julian 3. F/u with neurology 4. Home health ordered  Discharge Diagnoses:  Active Hospital Problems   Diagnosis Date Noted  . Acute ischemic stroke ([place]) 01/16/2018  . Acute CVA (cerebrovascular accident) ([place]) 01/16/2018  . CAD S/P percutaneous coronary angioplasty 02/08/2016  . DM (diabetes mellitus), type 2, uncontrolled with complications ([place]) 16/60/6301  . Essential hypertension 12/10/2006    Resolved Hospital Problems  No resolved problems to display.    Discharge Condition: stable  Diet recommendation: heart healthy/carb modified  Filed Weights   01/15/18 2319 01/16/18 0254  Weight: 81.6 kg 80.5 kg    History of present illness: (per admitting MD Dr Myna Hidalgo) PCP: Seward Carol, MD   Patient coming from: Home   Chief Complaint: Left-sided weakness, slurred speech   HPI: Leah Norton is a 62 y.o. female with medical history significant for coronary artery disease with stent, hypertension, and type 2 diabetes mellitus, presenting to the emergency department for evaluation of slurred speech and left-sided weakness.  Patient reports that she developed some rhinorrhea early in the a.m. of 01/13/2018, took a Benadryl for this, later noted some difficulty with balance that she suspected was adverse effect of Benadryl, but then at ~7 PM on 01/14/2018, noted weakness involving the left arm and leg as well as slurred speech.  Symptoms have failed to improve and so she now comes into the ED for evaluation.  She denies any recent fevers, chills, chest pain, or palpitations.  She denies headache or change  in vision or hearing.  ED Course: Upon arrival to the ED, patient is found to be afebrile, saturating well on room air, and with vitals otherwise normal.  EKG features a sinus rhythm with LAD.  MRI brain reveals subcentimeter acute or early subacute infarction within the right pons, but no hemorrhage or mass-effect.  Chemistry panel is unremarkable and CBC notable for a mild stable thrombocytosis.  Neurology was consulted by the ED physician and the patient will be observed on the telemetry unit for ongoing evaluation and management of acute ischemic CVA.   Hospital Course:  Principal Problem:   Acute ischemic stroke [facility]) Active Problems:   DM (diabetes mellitus), type 2, uncontrolled with complications ([place])   Essential hypertension   CAD S/P percutaneous coronary angioplasty   Acute CVA (cerebrovascular accident) ([place])  Acute nonhemorrhagic CVA right paramedian pontine Suspected secondary to small vessel disease 50-75% stenosis of the proximal right M1 MCA, noncontributory with respect to the acute ischemia Per neurology continue aspirin and Plavix x3 weeks then Plavix alone, continue statin (  LDL 77) Follow-up with neurology outpatient Patient is with balance issues and dysarthria Home health PT/OT/speech ordered  Chronic systolic chf: EF 60-10% reduced compare to echo from 2018 Cardiology consulted: Per cardiology Dr Percival Spanish on 11/9 " Holding off on restarting ARB for now for permissive HTN for 5 - 7 days.  She should follow with PCP and have ARB restarted slowly.  Later will need beta blocker" Outpatient cardiology follow up with Dr Tamala Julian  HTN: allow for permissive hypertension for 5-7 days Patient is to follow up with pcp/cardioloyg/neurology for bp meds titration.  noninsulin dependent DM2, controlled a1c 6.6 Continue home meds Janumet  H/o GI bleed in 2018  No bleeding, hgb wnl currently F/u with pcp  Cigarette smoking: I have discussed tobacco cessation with the  patient.  I have counseled the patient regarding the negative impacts of continued tobacco use including but not limited to lung cancer, COPD, and cardiovascular disease.  I have discussed alternatives to tobacco and modalities that may help facilitate tobacco cessation including but not limited to biofeedback, hypnosis, and medications.  Total time spent with tobacco counseling was 4 minutes.   Procedures:  none  Consultations:  neurology  Discharge Exam: BP 140/78 (BP Location: Right Arm)   Pulse 75   Temp 98.5 F (36.9 C) (Oral)   Resp 18   Ht 5\' 5"  (1.651 m)   Wt 80.5 kg   SpO2 100%   BMI 29.53 kg/m   General: NAD, oriented x3, dysarthria, mild left facial droop Cardiovascular: RRR Respiratory: CTABL  Discharge Instructions You were cared for by a hospitalist during your hospital stay. If you have any questions about your discharge medications or the care you received while you were in the hospital after you are discharged, you can call the unit and asked to speak with the hospitalist on call if the hospitalist that took care of you is not available. Once you are discharged, your primary care physician will handle any further medical issues. Please note that NO REFILLS for any discharge medications will be authorized once you are discharged, as it is imperative that you return to your primary care physician (or establish a relationship with a primary care physician if you do not have one) for your aftercare needs so that they can reassess your need for medications and monitor your lab values.  Discharge Instructions    Ambulatory referral to Neurology   Complete by:  As directed    Follow up with stroke clinic NP (Jessica Vanschaick or Cecille Rubin, if both not available, consider Zachery Dauer, or [place]) at [facility] in about 4 weeks. Thanks.   Diet - low sodium heart healthy   Complete by:  As directed    Carb modified   Discharge instructions   Complete by:  As directed     Please do not drive or return to work until released by pcp.   Increase activity slowly   Complete by:  As directed      Allergies as of 01/18/2018      Reactions   Codeine Rash   Erythromycin Rash   Penicillins Rash   Has patient had a PCN reaction causing immediate rash, facial/tongue/throat swelling, SOB or lightheadedness with hypotension: Yes Has patient had a PCN reaction causing severe rash involving mucus membranes or skin necrosis: No Has patient had a PCN reaction that required hospitalization: No Has patient had a PCN reaction occurring within the last 10 years: Yes If all of the above answers are "NO", then may proceed with Cephalosporin use.   Sulfonamide Derivatives Rash   Vicodin [hydrocodone-acetaminophen] Rash      Medication List    STOP taking these medications   atenolol 50 MG tablet Commonly known as:  TENORMIN   hydrochlorothiazide 25 MG tablet Commonly known as:  HYDRODIURIL   losartan 100 MG tablet Commonly known as:  COZAAR     TAKE these medications   aspirin EC 81 MG tablet Take 1 tablet (81 mg total) by mouth daily for 21 days.   buPROPion 150 MG 12 hr tablet  Commonly known as:  WELLBUTRIN SR Take 150 mg by mouth 2 (two) times daily.   clopidogrel 75 MG tablet Commonly known as:  PLAVIX Take 1 tablet (75 mg total) by mouth daily. Start taking on:  01/19/2018   Cod Liver Oil Caps Take 1 capsule by mouth once a week. One to two times a day   nitroGLYCERIN 0.4 MG SL tablet Commonly known as:  NITROSTAT Place 0.4 mg under the tongue every 5 (five) minutes as needed for chest pain (Call 911 at 3rd dose within 15 minutes.).   pantoprazole 40 MG tablet Commonly known as:  PROTONIX TAKE 1 TABLET BY MOUTH ONCE DAILY   rosuvastatin 10 MG tablet Commonly known as:  CRESTOR Take 10 mg by mouth daily.   sitaGLIPtin-metformin 50-1000 MG tablet Commonly known as:  JANUMET Take 1 tablet by mouth 2 (two) times daily with a meal.   Vitamin D3  25 MCG (1000 UT) Caps Take 2,000 Units by mouth daily.            Durable Medical Equipment  (From admission, onward)         Start     Ordered   01/17/18 1524  For home use only DME Walker rolling  Once    Question:  Patient needs a walker to treat with the following condition  Answer:  Ambulatory dysfunction   01/17/18 1523   01/17/18 1304  For home use only DME Walker rolling  Once    Question:  Patient needs a walker to treat with the following condition  Answer:  Stroke ([place])   01/17/18 1303         Allergies  Allergen Reactions  . Codeine Rash  . Erythromycin Rash  . Penicillins Rash    Has patient had a PCN reaction causing immediate rash, facial/tongue/throat swelling, SOB or lightheadedness with hypotension: Yes Has patient had a PCN reaction causing severe rash involving mucus membranes or skin necrosis: No Has patient had a PCN reaction that required hospitalization: No Has patient had a PCN reaction occurring within the last 10 years: Yes If all of the above answers are "NO", then may proceed with Cephalosporin use.   . Sulfonamide Derivatives Rash  . Vicodin [Hydrocodone-Acetaminophen] Rash   Follow-up Information    [facility]. Schedule an appointment as soon as possible for a visit in 4 week(s).   Specialty:  Neurology Contact information: [address] [place] [phone]       Seward Carol, MD Follow up in 1 week(s).   Specialty:  Internal Medicine Why:  hospital discharge follow up, repeat cbc/bmp pcp to restart cozaar in 5-7 days Contact information: [address] [place]  [address] [phone]        Belva Crome, MD Follow up in 3 week(s).   Specialty:  Cardiology Why:  for heart failure management Contact information: [address] [place] [address] [phone]            The results of significant diagnostics from  this hospitalization (including imaging, microbiology, ancillary and laboratory) are listed below for reference.    Significant Diagnostic Studies: Ct Angio Head W Or Wo Contrast  Result Date: 01/16/2018 CLINICAL DATA:  Follow-up stroke. EXAM: CT ANGIOGRAPHY HEAD AND NECK TECHNIQUE: Multidetector CT imaging of the head and neck was performed using the standard protocol during bolus administration of intravenous contrast. Multiplanar CT image reconstructions and MIPs were obtained to  evaluate the vascular anatomy. Carotid stenosis measurements (when applicable) are obtained utilizing NASCET criteria, using the distal internal carotid diameter as the denominator. CONTRAST:  22mL ISOVUE-370 IOPAMIDOL (ISOVUE-370) INJECTION 76% COMPARISON:  MR brain earlier today. FINDINGS: CT HEAD FINDINGS Brain: No evidence for acute infarction, hemorrhage, mass lesion, hydrocephalus, or extra-axial fluid. Slight premature for age atrophy. No significant white matter disease. Cytotoxic edema within the acute RIGHT paramedian pontine infarct is not visible on CT. Vascular: Reported separately Skull: Intact. Sinuses: Clear Orbits: Unremarkable. Review of the MIP images confirms the above findings CTA NECK FINDINGS Aortic arch: Bovine trunk, common origin innominate and LEFT subclavian. Aberrant origin RIGHT subclavian. Imaged portion shows no evidence of aneurysm or dissection. No significant stenosis of the major arch vessel origins. Right carotid system: No evidence of dissection, stenosis (50% or greater) or occlusion. Minor atheromatous change at the bifurcation. Left carotid system: No evidence of dissection, stenosis (50% or greater) or occlusion. Minor atheromatous change the bifurcation. Vertebral arteries: Codominant. No evidence of dissection, stenosis (50% or greater) or occlusion. Skeleton: Spondylosis. Other neck: No masses. Upper chest: No pneumothorax or consolidation. Review of the MIP images confirms the above  findings CTA HEAD FINDINGS Anterior circulation: Calcification of the cavernous internal carotid arteries consistent with cerebrovascular atherosclerotic disease. No signs of intracranial large vessel occlusion. 50-75% stenosis at the origin RIGHT M1 MCA from the internal carotid artery. No similar findings on the LEFT. Codominant anterior cerebral arteries. No MCA M2 or M3 branch occlusion. Posterior circulation: Minor atheromatous change LEFT V4 vertebral. Both vertebrals contribute to formation of the basilar. 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 Slight hypoplasia due to fetal LEFT PCA. Venous sinuses: As permitted by contrast timing, patent. Anatomic variants: LEFT fetal PCA. Delayed phase: No abnormal intracranial enhancement. Review of the MIP images confirms the above findings IMPRESSION: RIGHT paramedian brainstem infarct is not visible on pre or postcontrast CT imaging. No visible hemorrhagic transformation. No posterior circulation/basilar artery stenosis or dissection is observed. Minor calcific atheromatous change of the BILATERAL cavernous carotid arteries. 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 Electronically Signed   By: Staci Righter M.D.   On: 01/16/2018 11:19   Ct Angio Neck W Or Wo Contrast  Result Date: 01/16/2018 CLINICAL DATA:  Follow-up stroke. EXAM: CT ANGIOGRAPHY HEAD AND NECK TECHNIQUE: Multidetector CT imaging of the head and neck was performed using the standard protocol during bolus administration of intravenous contrast. Multiplanar CT image reconstructions and MIPs were obtained to evaluate the vascular anatomy. Carotid stenosis measurements (when applicable) are obtained utilizing NASCET criteria, using the distal internal carotid diameter as the denominator. CONTRAST:  66mL ISOVUE-370 IOPAMIDOL (ISOVUE-370)  INJECTION 76% COMPARISON:  MR brain earlier today. FINDINGS: CT HEAD FINDINGS Brain: No evidence for acute infarction, hemorrhage, mass lesion, hydrocephalus, or extra-axial fluid. Slight premature for age atrophy. No significant white matter disease. Cytotoxic edema within the acute RIGHT paramedian pontine infarct is not visible on CT. Vascular: Reported separately Skull: Intact. Sinuses: Clear Orbits: Unremarkable. Review of the MIP images confirms the above findings CTA NECK FINDINGS Aortic arch: Bovine trunk, common origin innominate and LEFT subclavian. Aberrant origin RIGHT subclavian. Imaged portion shows no evidence of aneurysm or dissection. No significant stenosis of the major arch vessel origins. Right carotid system: No evidence of dissection, stenosis (50% or greater) or occlusion. Minor atheromatous change at the bifurcation. Left carotid  system: No evidence of dissection, stenosis (50% or greater) or occlusion. Minor atheromatous change the bifurcation. Vertebral arteries: Codominant. No evidence of dissection, stenosis (50% or greater) or occlusion. Skeleton: Spondylosis. Other neck: No masses. Upper chest: No pneumothorax or consolidation. Review of the MIP images confirms the above findings CTA HEAD FINDINGS Anterior circulation: Calcification of the cavernous internal carotid arteries consistent with cerebrovascular atherosclerotic disease. No signs of intracranial large vessel occlusion. 50-75% stenosis at the origin RIGHT M1 MCA from the internal carotid artery. No similar findings on the LEFT. Codominant anterior cerebral arteries. No MCA M2 or M3 branch occlusion. Posterior circulation: Minor atheromatous change LEFT V4 vertebral. Both vertebrals contribute to formation of the basilar. 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 Slight hypoplasia due to fetal LEFT PCA. Venous sinuses: As permitted by contrast timing, patent. Anatomic variants: LEFT fetal PCA. Delayed  phase: No abnormal intracranial enhancement. Review of the MIP images confirms the above findings IMPRESSION: RIGHT paramedian brainstem infarct is not visible on pre or postcontrast CT imaging. No visible hemorrhagic transformation. No posterior circulation/basilar artery stenosis or dissection is observed. Minor calcific atheromatous change of the BILATERAL cavernous carotid arteries. 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 Electronically Signed   By: Staci Righter M.D.   On: 01/16/2018 11:19   Mr Brain Wo Contrast  Result Date: 01/16/2018 CLINICAL DATA:  Left-sided weakness, slurred speech and altered mental status EXAM: MRI HEAD WITHOUT CONTRAST TECHNIQUE: Multiplanar, multiecho pulse sequences of the brain and surrounding structures were obtained without intravenous contrast. COMPARISON:  None. FINDINGS: BRAIN: Acute infarct within the right pons measures 7 mm. The midline structures are normal. No midline shift or other mass effect. There are no old infarcts. Multifocal white matter hyperintensity, most commonly due to chronic ischemic microangiopathy. The cerebral and cerebellar volume are age-appropriate. Susceptibility-sensitive sequences show no chronic microhemorrhage or superficial siderosis. VASCULAR: Major intracranial arterial and venous sinus flow voids are normal. SKULL AND UPPER CERVICAL SPINE: Calvarial bone marrow signal is normal. There is no skull base mass. Visualized upper cervical spine and soft tissues are normal. SINUSES/ORBITS: No fluid levels or advanced mucosal thickening. No mastoid or middle ear effusion. The orbits are normal. IMPRESSION: 1. Subcentimeter acute or early subacute infarct within the right pons, in keeping with reported left-sided weakness. 2. No acute hemorrhage, mass effect or hydrocephalus. Electronically Signed   By: Ulyses Jarred  M.D.   On: 01/16/2018 01:48    Microbiology: No results found for this or any previous visit (from the past 240 hour(s)).   Labs: Basic Metabolic Panel: Recent Labs  Lab 01/16/18 0006 01/16/18 0009  NA 140 141  K 3.5 3.5  CL 101 101  CO2 31  --   GLUCOSE 105* 104*  BUN 12 12  CREATININE 0.75 0.70  CALCIUM 10.4*  --    Liver Function Tests: Recent Labs  Lab 01/16/18 0006  AST 27  ALT 25  ALKPHOS 80  BILITOT 0.4  PROT 7.7  ALBUMIN 4.0   No results for input(s): LIPASE, AMYLASE in the last 168 hours. No results for input(s): AMMONIA in the last 168 hours. CBC: Recent Labs  Lab 01/16/18 0006 01/16/18 0009  WBC 7.8  --   NEUTROABS 5.2  --   HGB 11.4* 12.6  HCT 38.4 37.0  MCV 91.4  --   PLT 402*  --  Cardiac Enzymes: No results for input(s): CKTOTAL, CKMB, CKMBINDEX, TROPONINI in the last 168 hours. BNP: BNP (last 3 results) No results for input(s): BNP in the last 8760 hours.  ProBNP (last 3 results) No results for input(s): PROBNP in the last 8760 hours.  CBG: Recent Labs  Lab 01/17/18 1151 01/17/18 1600 01/17/18 2204 01/18/18 0616 01/18/18 1133  GLUCAP 139* 170* 198* 142* 197*       Signed:  Florencia Reasons MD, PhD  [facility] 01/18/2018, 12:11 PM

## 2018-01-18 NOTE — Progress Notes (Signed)
Discharged patient home with family, discharge instructions reviewed with sister and patient, and all questions were answered.  PIV removed per policy.

## 2018-01-18 NOTE — Progress Notes (Signed)
Progress Note  Patient Name: Leah Norton Date of Encounter: 01/18/2018  Primary Cardiologist:   Sinclair Grooms, MD   Subjective   She denies chest pain or SOB  Inpatient Medications    Scheduled Meds: . aspirin EC  81 mg Oral Daily  . buPROPion  150 mg Oral BID WC  . cholecalciferol  2,000 Units Oral Daily  . clopidogrel  75 mg Oral Daily  . enoxaparin (LOVENOX) injection  40 mg Subcutaneous Q24H  . insulin aspart  0-5 Units Subcutaneous QHS  . insulin aspart  0-9 Units Subcutaneous TID WC  . pantoprazole  40 mg Oral Daily  . rosuvastatin  10 mg Oral q1800   Continuous Infusions:  PRN Meds: acetaminophen, LORazepam, senna-docusate   Vital Signs    Vitals:   01/17/18 2001 01/17/18 2317 01/18/18 0312 01/18/18 0722  BP: (!) 147/78 (!) 142/72 (!) 158/78 (!) 144/73  Pulse: 72 66 71 66  Resp: 18 18 16 18   Temp: 98.3 F (36.8 C) 98.8 F (37.1 C) 98.8 F (37.1 C) 99 F (37.2 C)  TempSrc: Oral Oral Oral Oral  SpO2: 99% 96% 100% 99%  Weight:      Height:       No intake or output data in the 24 hours ending 01/18/18 1111 Filed Weights   01/15/18 2319 01/16/18 0254  Weight: 81.6 kg 80.5 kg    Telemetry    NSR - Personally Reviewed  ECG    NA - Personally Reviewed  Physical Exam   GEN: No acute distress.   Neck: No  JVD Cardiac: RRR, no murmurs, rubs, or gallops.  Respiratory: Clear  to auscultation bilaterally. GI: Soft, nontender, non-distended  MS: No  edema; No deformity. Neuro:  Nonfocal  Psych: Normal affect   Labs    Chemistry Recent Labs  Lab 01/16/18 0006 01/16/18 0009  NA 140 141  K 3.5 3.5  CL 101 101  CO2 31  --   GLUCOSE 105* 104*  BUN 12 12  CREATININE 0.75 0.70  CALCIUM 10.4*  --   PROT 7.7  --   ALBUMIN 4.0  --   AST 27  --   ALT 25  --   ALKPHOS 80  --   BILITOT 0.4  --   GFRNONAA >60  --   GFRAA >60  --   ANIONGAP 8  --      Hematology Recent Labs  Lab 01/16/18 0006 01/16/18 0009  WBC 7.8  --     RBC 4.20  --   HGB 11.4* 12.6  HCT 38.4 37.0  MCV 91.4  --   MCH 27.1  --   MCHC 29.7*  --   RDW 14.3  --   PLT 402*  --     Cardiac EnzymesNo results for input(s): TROPONINI in the last 168 hours.  Recent Labs  Lab 01/16/18 0008  TROPIPOC 0.00     BNPNo results for input(s): BNP, PROBNP in the last 168 hours.   DDimer No results for input(s): DDIMER in the last 168 hours.   Radiology    No results found.  Cardiac Studies   Echo  Study Conclusions  - Left ventricle: The cavity size was mildly dilated. Systolic   function was mildly to moderately reduced. The estimated ejection   fraction was in the range of 40% to 45%. Diffuse hypokinesis.   Moderate hypokinesis of the inferior myocardium. Features are   consistent with a pseudonormal left ventricular  filling pattern,   with concomitant abnormal relaxation and increased filling   pressure (grade 2 diastolic dysfunction). - Mitral valve: There was mild regurgitation. - Left atrium: The atrium was moderately dilated. - Atrial septum: No defect or patent foramen ovale was identified.  Patient Profile     62 y.o. female with a hx of CAD s/p LAD PCI in 2017, DM, HTN, and HLD, admitted for acute CVA and found to have reduced LVEF on echo. She is being seen for the evaluation of reduced EF, at the request of Dr. Nevada Crane, Internal Medicine.   Assessment & Plan    REDUCED EF:  No in patient work up is planned.  She can follow with Dr. Casilda Carls al as an outpatient.   Holding off on restarting ARB for now for permissive HTN for 5 - 7 days.  She should follow with PCP and have ARB restarted slowly.  Later will need beta blocker.  I will ask the staff to see her in about a month in our office to consider further work up of the low EF.     CVA:  Per neurology and primary team.    DM:  Per primary team.    TOBACCO ABUSE:  Educated.       For questions or updates, please contact [place] Please consult [email or web address]  for contact info under Cardiology/STEMI.   Signed, Minus Breeding, MD  01/18/2018, 11:11 AM

## 2018-01-18 NOTE — Care Management Note (Addendum)
Case Management Note  Patient Details  Name: JAHAIRA EARNHART MRN: [id number] Date of Birth: [birth year]  Subjective/Objective:    Pt presented for CVA.  Pt from home with family.                 Action/Plan: Pt given list to choose [facility] agency.  Pt chooses [facility].  Referral called to Meredeth Ide liaison.    Expected Discharge Date:  01/18/18               Expected Discharge Plan:  [facility]  In-House Referral:     Discharge planning Services  CM Consult  Post Acute Care Choice:  Home Health Choice offered to:  Patient  DME Arranged:  RW DME Agency:  [facility]  HH Arranged:  PT, OT, Speech Therapy HH Agency:  [facility]  Status of Service:  Completed, signed off  If discussed at [place] of Stay Meetings, dates discussed:    Additional Comments:  Claudie Leach, RN 01/18/2018, 1:45 PM

## 2018-01-30 NOTE — Care Management (Signed)
01/30/2018 1100am: received phone call from [name] (pts sister) last week about [facility] services and the co pays for those services. CM spoke with Tommi Rumps with [employer] [facility] who initially received the referral. CM also spoke to [place] with [employer] (with Irene's permission) about how their coverage would be. Both companies would have the same copays for the [facility] services. CM informed [name] of this and she wanted to speak with family before making any decisions. CM has not heard back from her.  CM office received phone call today from the patient asking about [facility] services. CM attempted to reach her back at the number left without success.

## 2018-02-11 ENCOUNTER — Ambulatory Visit: Payer: BC Managed Care – PPO | Attending: Internal Medicine | Admitting: Occupational Therapy

## 2018-02-11 ENCOUNTER — Ambulatory Visit: Payer: BC Managed Care – PPO | Admitting: Speech Pathology

## 2018-02-11 ENCOUNTER — Ambulatory Visit: Payer: BC Managed Care – PPO | Admitting: Physical Therapy

## 2018-02-11 ENCOUNTER — Other Ambulatory Visit: Payer: Self-pay

## 2018-02-11 ENCOUNTER — Encounter: Payer: Self-pay | Admitting: Physical Therapy

## 2018-02-11 DIAGNOSIS — R41844 Frontal lobe and executive function deficit: Secondary | ICD-10-CM | POA: Diagnosis present

## 2018-02-11 DIAGNOSIS — R41841 Cognitive communication deficit: Secondary | ICD-10-CM

## 2018-02-11 DIAGNOSIS — I69923 Fluency disorder following unspecified cerebrovascular disease: Secondary | ICD-10-CM | POA: Diagnosis present

## 2018-02-11 DIAGNOSIS — R2681 Unsteadiness on feet: Secondary | ICD-10-CM

## 2018-02-11 DIAGNOSIS — R471 Dysarthria and anarthria: Secondary | ICD-10-CM

## 2018-02-11 DIAGNOSIS — R278 Other lack of coordination: Secondary | ICD-10-CM

## 2018-02-11 DIAGNOSIS — M6281 Muscle weakness (generalized): Secondary | ICD-10-CM | POA: Diagnosis present

## 2018-02-11 DIAGNOSIS — R2689 Other abnormalities of gait and mobility: Secondary | ICD-10-CM | POA: Diagnosis present

## 2018-02-11 NOTE — Therapy (Signed)
[facility] [address] [place], [place], [address] Phone: [phone]   Fax:  [phone]  Physical Therapy Evaluation  Patient Details  Name: Leah Norton MRN: [id number] Date of Birth: 02/24/1956 Referring Provider (PT): Seward Carol, MD   Encounter Date: 02/11/2018  PT End of Session - 02/11/18 1443    Visit Number  1    Number of Visits  23    Date for PT Re-Evaluation  05/12/18    Authorization Type  State BCBS    PT Start Time  [id number]   Pt arrived late   PT Stop Time  1022    PT Time Calculation (min)  38 min    Activity Tolerance  Patient tolerated treatment well       Past Medical History:  Diagnosis Date  . Anginal pain ([place])   . Arthritis    "right anikle" (02/29/2016)  . CAD in native artery 02/08/2016   Three-vessel coronary artery calcification on CT scan  . Complication of anesthesia    "had reaction w/hives, rash, itching" (02/29/2016)  . Constipation   . Coronary artery disease    12/17 PCI with DESx1 to pLAD EF normal  . Enlarged heart   . GERD (gastroesophageal reflux disease)   . High cholesterol   . Hypertension   . Migraine    hx  . Symptomatic anemia 07/10/2016  . Type II diabetes mellitus ([place])     Past Surgical History:  Procedure Laterality Date  . ABDOMINAL HYSTERECTOMY    . CARDIAC CATHETERIZATION N/A 02/29/2016   Procedure: Left Heart Cath and Coronary Angiography;  Surgeon: Belva Crome, MD;  Location: [facility];  Service: Cardiovascular;  Laterality: N/A;  . CARDIAC CATHETERIZATION N/A 02/29/2016   Procedure: Coronary Stent Intervention;  Surgeon: Belva Crome, MD;  Location: [facility];  Service: Cardiovascular;  Laterality: N/A;  . CARDIAC CATHETERIZATION  2005   "no blockages"  . COLONOSCOPY WITH PROPOFOL N/A 03/15/2014   Procedure: COLONOSCOPY WITH PROPOFOL;  Surgeon: Garlan Fair, MD;  Location: [facility];  Service: Endoscopy;   Laterality: N/A;  . CORONARY ANGIOPLASTY WITH STENT PLACEMENT    . ESOPHAGOGASTRODUODENOSCOPY N/A 07/11/2016   Procedure: ESOPHAGOGASTRODUODENOSCOPY (EGD);  Surgeon: Laurence Spates, MD;  Location: [facility];  Service: Endoscopy;  Laterality: N/A;  . FRACTURE SURGERY    . MULTIPLE TOOTH EXTRACTIONS     teeth removed  . ORIF ANKLE FRACTURE Right 11/12/2012   Procedure: OPEN REDUCTION INTERNAL FIXATION (ORIF) ANKLE FRACTURE;  Surgeon: Marianna Payment, MD;  Location: [facility];  Service: Orthopedics;  Laterality: Right;  Open reduction internal fixation ankle fracture, trimalleolar    There were no vitals filed for this visit.   Subjective Assessment - 02/11/18 0946    Subjective  I'm here to have physical therapy following my stroke.  Affected my speech and my right and left side.  Feel left is my weakest.  Discharged home on 01/18/18.  No falls in past 6 months.  Does not use assistive device.    Pertinent History  PMH includes essential HTN, hyperlipidemia, DM, DDD lumbo-sacral spine; Hx of CVA 01/15/18    Patient Stated Goals  Pt's goals for physical therapy are to do whatever I can to return to normal.    Currently in Pain?  No/denies         [facility] PT Assessment - 02/11/18 0949      Assessment   Medical Diagnosis  Acute  CVA    Referring Provider (PT)  Seward Carol, MD    Onset Date/Surgical Date  01/15/18    Hand Dominance  Right      Precautions   Precautions  Fall   No driving     Balance Screen   Has the patient fallen in the past 6 months  No    Has the patient had a decrease in activity level because of a fear of falling?   No    Is the patient reluctant to leave their home because of a fear of falling?   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[employer], talking with walkie-talkie, patrol and secure areas    Leisure  Likes to do arts and crafts, decorations      Observation/Other Assessments   Focus on Therapeutic Outcomes (FOTO)   Staff did not capture (pt arrived late for appointment)      Sensation   Light Touch  Appears Intact      Posture/Postural Control   Posture/Postural Control  --      ROM / Strength   AROM / PROM / Strength  Strength      Strength   Overall Strength  Deficits    Overall Strength Comments  Grossly tested L hip flexion 3+/5, L knee extension 4/5; 4+/5 throughout other muscle groups      Transfers   Transfers  Sit to Stand;Stand to Sit    Sit to Stand  5: Supervision;Without upper extremity assist;From chair/3-in-1    Five time sit to stand comments   14.38    Stand to Sit  5: Supervision;Without upper extremity assist;To chair/3-in-1      Ambulation/Gait   Ambulation/Gait  Yes    Ambulation/Gait Assistance  5: Supervision    Ambulation Distance (Feet)  200 Feet    Assistive device  None    Gait Pattern  Step-through pattern;Decreased step length - left;Decreased trunk rotation;Decreased arm swing - left    Ambulation Surface  Level;Indoor    Gait velocity  16.63 sec = 1.97 ft/sec   fast = 9.92 sec = 3.31 ft/sec     Standardized Balance Assessment   Standardized Balance Assessment  Timed Up and Go Test;Dynamic Gait Index      Dynamic Gait Index   Level Surface  Mild Impairment    Change in Gait Speed  Mild Impairment    Gait with Horizontal Head Turns  Mild Impairment    Gait with Vertical Head Turns  Mild Impairment    Gait and Pivot  Turn  Normal    Step Over Obstacle  Mild Impairment    Step Around Obstacles  Mild Impairment    Steps  Normal    Total  Score  18    DGI comment:  Scores <19/24 indicate increased fall risk      Timed Up and Go Test   Normal TUG (seconds)  13.07    TUG Comments  Scores >13.5 seconds indicate increased fall risk                Objective measurements completed on examination: See above findings.                PT Short Term Goals - 02/11/18 1455      PT SHORT TERM GOAL #1   Title  Pt will be independent with HEP for improved strength, balance, gait.  TARGET 03/14/18    Time  5    Period  Weeks    Target Date  03/14/18      PT SHORT TERM GOAL #2   Title  Pt will improve DGI score to at least 21/24 for decreased fall risk.    Time  5    Period  Weeks    Status  New    Target Date  03/14/18      PT SHORT TERM GOAL #3   Title  6 minute walk test to be assessed, with goal to be written as appropriate.    Time  5    Period  Weeks    Status  New    Target Date  03/14/18      PT SHORT TERM GOAL #4   Title  Pt will verbalize understanding of CVA warning signs and symptoms.    Time  5    Period  Weeks    Status  New    Target Date  03/14/18        PT Long Term Goals - 02/11/18 1459      PT LONG TERM GOAL #1   Title  Pt will verbalize understanding of fall prevention in the home environment.  TARGET 04/11/18    Time  9    Period  Weeks    Status  New    Target Date  04/11/18      PT LONG TERM GOAL #2   Title  Pt will improve 5x sit<>stand score to less than or equal to 11.4 seconds for improved functional strength and transfer efficiency.    Time  9    Period  Weeks    Status  New    Target Date  04/11/18      PT LONG TERM GOAL #3   Title  Pt will improve gait velocity to at least 2.62 ft/sec for improved gait efficiency and safety.    Time  9    Period  Weeks    Status  New    Target Date  04/11/18      PT LONG TERM GOAL #4   Title  Pt  will ambulate at least 1000 ft, indoor and outdoor surfaces, independently no LOB, for improved gait negotiation outdoor surfaces.    Time  9    Period  Weeks    Status  New    Target Date  04/11/18             Plan - 02/11/18 1444    Clinical Impression Statement  Pt is a 62 year old female who presents to OPPT status post CVA 01/15/18, with hospitalization and discharge home 01/18/18.  Pt presents with decreased strength, decreased balance, decreased independence with gait.  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  She would benefit from skilled PT to address the above stated deficits to improve functional mobility and decrease fall risk.    History and Personal Factors relevant to plan of care:  PMH >3 co-morbidities, Independent prior to CVA    Clinical Presentation  Stable    Clinical Presentation due to:  At fall risk per DGI score    Clinical Decision Making  Low    Rehab Potential  Good    PT Frequency  Other (comment)   1x/wk in addition to eval; then 3x/wk for 4 weeks, 2x/wk for 4 weeks   PT Duration  --   total POC = 9 weeks   PT Treatment/Interventions  ADLs/Self Care Home Management;Gait training;Functional mobility training;Therapeutic activities;Therapeutic exercise;Balance training;Patient/family education;Neuromuscular re-education    PT Next Visit Plan  Initiate HEP for strength and balance, further compliant surface balance assessment (check EO/EC on foam); gait training work on increased L step length     Consulted and Agree with Plan of Care  Patient       Patient will benefit from skilled therapeutic intervention in order to improve the following deficits and impairments:  Abnormal gait, Decreased balance, Decreased mobility, Difficulty walking, Decreased strength  Visit Diagnosis: Other abnormalities of gait and mobility  Muscle weakness (generalized)  Unsteadiness on feet     Problem List Patient Active Problem List    Diagnosis Date Noted  . Acute ischemic stroke ([place]) 01/16/2018  . Acute CVA (cerebrovascular accident) ([place]) 01/16/2018  . GI bleed 07/10/2016  . Morbid obesity due to excess calories ([place]) 05/14/2016  . Dyspnea on exertion 04/29/2016  . Abnormal nuclear stress test   . CAD S/P percutaneous coronary angioplasty 02/08/2016  . INSOMNIA, CHRONIC 02/28/2010  . HEADACHE 02/28/2010  . HEEL PAIN, RIGHT 11/29/2009  . COUGH DUE TO ACE INHIBITORS 11/29/2009  . ATRIAL ENLARGEMENT, LEFT 03/21/2009  . ANEMIA 12/28/2008  . CARDIAC MURMUR 12/28/2008  . OTHER NONSPECIFIC ABNORMAL SERUM ENZYME LEVELS 12/28/2008  . CERVICAL MUSCLE STRAIN 12/28/2008  . CONSTIPATION 06/01/2008  . POSTMENOPAUSAL STATUS 05/15/2008  . BRONCHITIS, CHRONIC 12/18/2007  . Cigarette smoker 08/19/2007  . DEGENERATIVE DISC DISEASE, LUMBOSACRAL SPINE 08/19/2007  . LOW BACK PAIN SYNDROME 08/19/2007  . UPPER RESPIRATORY INFECTION, VIRAL 03/05/2007  . NEOPLASM, SKIN, UNCERTAIN BEHAVIOR 67/89/3810  . GANGLION CYST, WRIST, LEFT 01/28/2007  . DENTAL PAIN 01/19/2007  . DM (diabetes mellitus), type 2, uncontrolled with complications ([place]) 17/51/0258  . Hyperlipidemia with target LDL less than 70 12/10/2006  . Essential hypertension 12/10/2006  . RHINITIS, ALLERGIC NEC 12/10/2006  . HIP PAIN, RIGHT 12/10/2006    Frazier Butt. 02/11/2018, 3:02 PM  Frazier Butt., PT   [facility] [address] [place], [place], [address] Phone: [phone]   Fax:  [phone]  Name: Leah Norton MRN: [id number] Date of Birth: 10/30/1955

## 2018-02-11 NOTE — Patient Instructions (Signed)
1. Grip Strengthening (Resistive Putty)   Squeeze putty using thumb and all fingers. Repeat _20___ times. Do __2__ sessions per day.   2. Roll putty into tube on table and pinch between each finger and thumb x 10 reps each. (can do ring and small finger together)     Copyright  VHI. All rights reserved.      Today we completed your occupational therapy evaluation and I will set goals based on our findings

## 2018-02-11 NOTE — Therapy (Signed)
[facility] [address] [place], [place], [address] Phone: [phone]   Fax:  [phone]  Speech Language Pathology Evaluation  Patient Details  Name: Leah Norton MRN: [id number] Date of Birth: [birth year] Referring Provider (SLP): Seward Carol, MD   Encounter Date: 02/11/2018  End of Session - 02/11/18 1433    Visit Number  1    Number of Visits  21    Date for SLP Re-Evaluation  04/12/18    Authorization Type  [employer]    SLP Start Time  0938    SLP Stop Time   1829    SLP Time Calculation (min)  49 min    Activity Tolerance  Patient tolerated treatment well       Past Medical History:  Diagnosis Date  . Anginal pain ([place])   . Arthritis    "right anikle" (02/29/2016)  . CAD in native artery 02/08/2016   Three-vessel coronary artery calcification on CT scan  . Complication of anesthesia    "had reaction w/hives, rash, itching" (02/29/2016)  . Constipation   . Coronary artery disease    12/17 PCI with DESx1 to pLAD EF normal  . Enlarged heart   . GERD (gastroesophageal reflux disease)   . High cholesterol   . Hypertension   . Migraine    hx  . Symptomatic anemia 07/10/2016  . Type II diabetes mellitus ([place])     Past Surgical History:  Procedure Laterality Date  . ABDOMINAL HYSTERECTOMY    . CARDIAC CATHETERIZATION N/A 02/29/2016   Procedure: Left Heart Cath and Coronary Angiography;  Surgeon: Belva Crome, MD;  Location: [facility];  Service: Cardiovascular;  Laterality: N/A;  . CARDIAC CATHETERIZATION N/A 02/29/2016   Procedure: Coronary Stent Intervention;  Surgeon: Belva Crome, MD;  Location: [facility];  Service: Cardiovascular;  Laterality: N/A;  . CARDIAC CATHETERIZATION  2005   "no blockages"  . COLONOSCOPY WITH PROPOFOL N/A 03/15/2014   Procedure: COLONOSCOPY WITH PROPOFOL;  Surgeon: Garlan Fair, MD;  Location: [facility];  Service: Endoscopy;  Laterality: N/A;   . CORONARY ANGIOPLASTY WITH STENT PLACEMENT    . ESOPHAGOGASTRODUODENOSCOPY N/A 07/11/2016   Procedure: ESOPHAGOGASTRODUODENOSCOPY (EGD);  Surgeon: Laurence Spates, MD;  Location: [facility];  Service: Endoscopy;  Laterality: N/A;  . FRACTURE SURGERY    . MULTIPLE TOOTH EXTRACTIONS     teeth removed  . ORIF ANKLE FRACTURE Right 11/12/2012   Procedure: OPEN REDUCTION INTERNAL FIXATION (ORIF) ANKLE FRACTURE;  Surgeon: Marianna Payment, MD;  Location: [facility];  Service: Orthopedics;  Laterality: Right;  Open reduction internal fixation ankle fracture, trimalleolar    There were no vitals filed for this visit.  Subjective Assessment - 02/11/18 1151    Subjective  "I noticed I could not do as I had done in the past," pt, re: speech changes    Currently in Pain?  No/denies         SLP Evaluation [facility] - 02/11/18 1151      SLP Visit Information   SLP Received On  02/11/18    Referring Provider (SLP)  Seward Carol, MD    Onset Date  01/15/18    Medical Diagnosis  CVA      Subjective   Patient/Family Stated Goal  go back to work      General Information   HPI  62 yo female admitted to [facility] 11/6-11/9/19 with left weakness and speech deficits.  Pt found to have  right pons early subacute or acute cva. Per SLP eval on acute, pt reported having speech therapy in her childhood for stuttering and word finding. She denied problems with these issues until this event.      Balance Screen   Has the patient fallen in the past 6 months  No    Has the patient had a decrease in activity level because of a fear of falling?   No    Is the patient reluctant to leave their home because of a fear of falling?   No      Prior Functional Status   Cognitive/Linguistic Baseline  Within functional limits    Type of Home  House     Lives With  Alone    Available Support  Family      Cognition   Overall Cognitive Status  Impaired/Different from baseline    Attention  Alternating    Alternating Attention   Impaired    Alternating Attention Impairment  Functional basic    Memory  Impaired    Memory Impairment  Decreased recall of new information;Decreased short term memory    Decreased Short Term Memory  Verbal basic;Functional basic   story recall impaired; incomplete recall of OT instructions   Awareness  Impaired    Awareness Impairment  Emergent impairment;Anticipatory impairment    Problem Solving  Impaired    Problem Solving Impairment  --   cues re: how to fix errors in clock Primary school teacher  Impaired    Organizing Impairment  Functional complex   mazes, clock drawing impaired   Behaviors  Impulsive   began trailmaking, clock drawing, mazes and naming early     Auditory Comprehension   Overall Auditory Comprehension  Appears within functional limits for tasks assessed    Conversation  Simple    Interfering Components  Attention;Working Corporate investment banker  Not tested      Reading Comprehension   Reading Status  Not tested      Expression   Primary Mode of Expression  Verbal      Verbal Expression   Overall Verbal Expression  Appears within functional limits for tasks assessed    Level of Generative/Spontaneous Verbalization  Conversation    Repetition  No impairment    Naming  No impairment   started animals but began naming only mammals ~45 seconds     Written Expression   Dominant Hand  Right    Written Expression  Not tested      Oral Motor/Sensory Function   Overall Oral Motor/Sensory Function  Impaired    Labial ROM  Reduced left    Labial Symmetry  Abnormal symmetry left    Labial Strength  Reduced Left      Motor Speech   Overall Motor Speech  Impaired    Respiration  Within functional limits    Phonation  Normal    Articulation  Impaired    Level of Impairment  Conversation    Intelligibility  Intelligible    Motor Planning  --    initial sound repetitions (/f, s, k, blends), substitutions   Motor Speech Errors  Inconsistent    Effective Techniques  Slow rate    Phonation  WFL   pitch subjectively low for age/gender; per pt baseline     Standardized Assessments   Standardized Assessments   Cognitive Linguistic  Quick Test      Cognitive Linguistic Quick Test (Ages 18-69)   Attention  Mild   SLP judges more moderate deficits functionally   Memory  Mild   SLP judges more moderate deficits functionally   Executive Function  Mild   SLP judges more moderate deficits functionally   Language  WNL    Visuospatial Skills  Moderate   Clock Drawing 9/13, Moderate   Severity Rating Total  15    Composite Severity Rating  13.4                      SLP Education - 02/11/18 1432    Education Details  deficit areas, memory tips/strategies    Person(s) Educated  Patient    Methods  Explanation;Handout    Comprehension  Verbalized understanding;Need further instruction;Verbal cues required       SLP Short Term Goals - 02/11/18 1434      SLP SHORT TERM GOAL #1   Title  Pt will respond with personally relevant sentences 18/20 with [facility] speech fluidity over three sessions with use of compensations (slow rate)    Time  4    Period  Weeks    Status  New      SLP SHORT TERM GOAL #2   Title  Pt will use a memory compensation system to record, reference and organize daily activities/appointments with occasional min A x 2 sessions     Time  4    Period  Weeks    Status  New      SLP SHORT TERM GOAL #3   Title  Pt will tell SLP 2 cognitive deficits that would impact her at work.    Time  4    Period  Weeks    Status  New      SLP SHORT TERM GOAL #4   Title  Pt will demo attention to detail by recalling pertinent information from 3-5 sentence auditory messages 85% accuracy x 3 sessions (verbal/ written summary or responding to questions)    Time  4    Period  Weeks    Status  New       SLP Long  Term Goals - 02/11/18 1521      SLP LONG TERM GOAL #1   Title  Using compensatory strategies for fluency/dysarthria, pt will functionally ask investigative questions or give a verbal report of functional scenarios over 4 sessions    Time  8    Period  Weeks    Status  New      SLP LONG TERM GOAL #2   Title  Pt will demo WFL fluidity of speech in 10 minutes mod to complex/complex conversation with modified independence outside of ST room x 3 sessions    Time  8    Period  Weeks    Status  New      SLP LONG TERM GOAL #3   Title  pt will demo functional executive function (e.g., planning, organization) ability in personally relevant tasks (written and/or auditorily provided) 90% of the time     Time  8    Period  Weeks    Status  New      SLP LONG TERM GOAL #4   Title  Pt will demo 15 minutes alternating attention between two mod complex cognitive tasks to achieve >85% success on each task over two sessions    Time  8    Period  Weeks  Status  New      SLP LONG TERM GOAL #5   Title  pt will demo WNL emergent awareness with mod complex tasks in order to achieve >95% success over three sessions     Time  8    Period  Weeks    Status  New       Plan - 02/11/18 1319    Clinical Impression Statement  Patient presents with moderate cognitive communication deficits, mild dysarthria and frequent disfluencies in conversation (initial syllable repetition, typically, though occasional substitutions noted). Per CLQT, pt scored within "mild" range of severity for attention, memory and executive functions, and "moderate" range of severity for visuospatial skills and clockdrawing, however this SLP judges pt's deficits to be more moderate than mild overall. Pt was impulsive during testing, initiating tasks frequently without attending to full instructions. Moderate verbal and visual cues were required for emergent awareness of errors in cognitive tasks. Disfluencies were more frequent when  cognitive demand increased. 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 Deficits in attention, recall, and organization would hinder her ability to complete job duties at this time, including completing investigations, dictation, and writing reports. I recommend skilled ST to maximize cognitive and communicative function for possible return to work and improved independence and QOL.    Speech Therapy Frequency  3x / week   3x per week for 4 weeks, then 2x per week for 4 weeks   Treatment/Interventions  Cognitive reorganization;Multimodal communcation approach;Compensatory strategies;Compensatory techniques;Cueing hierarchy;Internal/external aids;Functional tasks;SLP instruction and feedback;Patient/family education    Potential to Achieve Goals  Good    SLP Home Exercise Plan  memory strategies and cognitive tasks provided    Consulted and Agree with Plan of Care  Patient       Patient will benefit from skilled therapeutic intervention in order to improve the following deficits and impairments:   Cognitive communication deficit  Dysarthria and anarthria  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 Acute ischemic stroke ([place]) 01/16/2018  . Acute CVA (cerebrovascular accident) ([place]) 01/16/2018  . GI bleed 07/10/2016  . Morbid obesity due to excess calories ([place]) 05/14/2016  . Dyspnea on exertion 04/29/2016  . Abnormal nuclear stress test   . CAD S/P percutaneous coronary angioplasty 02/08/2016  . INSOMNIA, CHRONIC 02/28/2010  . HEADACHE 02/28/2010  . HEEL PAIN, RIGHT 11/29/2009  . COUGH DUE TO ACE INHIBITORS 11/29/2009  . ATRIAL ENLARGEMENT, LEFT 03/21/2009  . ANEMIA 12/28/2008  . CARDIAC MURMUR 12/28/2008  . OTHER NONSPECIFIC ABNORMAL SERUM ENZYME LEVELS 12/28/2008  . CERVICAL MUSCLE STRAIN 12/28/2008  .  CONSTIPATION 06/01/2008  . POSTMENOPAUSAL STATUS 05/15/2008  . BRONCHITIS, CHRONIC 12/18/2007  . Cigarette smoker 08/19/2007  . DEGENERATIVE DISC DISEASE, LUMBOSACRAL SPINE 08/19/2007  . LOW BACK PAIN SYNDROME 08/19/2007  . UPPER RESPIRATORY INFECTION, VIRAL 03/05/2007  . NEOPLASM, SKIN, UNCERTAIN BEHAVIOR 12/26/5100  . GANGLION CYST, WRIST, LEFT 01/28/2007  . DENTAL PAIN 01/19/2007  . DM (diabetes mellitus), type 2, uncontrolled with complications ([place]) 58/52/7782  . Hyperlipidemia with target LDL less than 70 12/10/2006  . Essential hypertension 12/10/2006  . RHINITIS, ALLERGIC NEC 12/10/2006  . HIP PAIN, RIGHT 12/10/2006   Deneise Lever, [place], [place] 02/11/2018, 3:24 PM  [facility] [address] [place], [place],  [address] Phone: [phone]   Fax:  [phone]  Name: SANAIYA WELLIVER MRN: [id number] Date of Birth: 08-25-1955

## 2018-02-11 NOTE — Therapy (Signed)
[facility] [address] [place], [place], [address] Phone: [phone]   Fax:  [phone]  Occupational Therapy Evaluation  Patient Details  Name: Leah Norton MRN: [id number] Date of Birth: [birth year] Referring Provider (OT): Dr. Delfina Redwood   Encounter Date: 02/11/2018  OT End of Session - 02/11/18 1818    Visit Number  1    Number of Visits  25    Date for OT Re-Evaluation  04/12/18    Authorization Type  State BCBS    OT Start Time  1023    OT Stop Time  1100    OT Time Calculation (min)  37 min    Activity Tolerance  Patient tolerated treatment well    Behavior During Therapy  Impulsive       Past Medical History:  Diagnosis Date  . Anginal pain ([place])   . Arthritis    "right anikle" (02/29/2016)  . CAD in native artery 02/08/2016   Three-vessel coronary artery calcification on CT scan  . Complication of anesthesia    "had reaction w/hives, rash, itching" (02/29/2016)  . Constipation   . Coronary artery disease    12/17 PCI with DESx1 to pLAD EF normal  . Enlarged heart   . GERD (gastroesophageal reflux disease)   . High cholesterol   . Hypertension   . Migraine    hx  . Symptomatic anemia 07/10/2016  . Type II diabetes mellitus ([place])     Past Surgical History:  Procedure Laterality Date  . ABDOMINAL HYSTERECTOMY    . CARDIAC CATHETERIZATION N/A 02/29/2016   Procedure: Left Heart Cath and Coronary Angiography;  Surgeon: Belva Crome, MD;  Location: [facility];  Service: Cardiovascular;  Laterality: N/A;  . CARDIAC CATHETERIZATION N/A 02/29/2016   Procedure: Coronary Stent Intervention;  Surgeon: Belva Crome, MD;  Location: [facility];  Service: Cardiovascular;  Laterality: N/A;  . CARDIAC CATHETERIZATION  2005   "no blockages"  . COLONOSCOPY WITH PROPOFOL N/A 03/15/2014   Procedure: COLONOSCOPY WITH PROPOFOL;  Surgeon: Garlan Fair, MD;  Location: [facility];  Service:  Endoscopy;  Laterality: N/A;  . CORONARY ANGIOPLASTY WITH STENT PLACEMENT    . ESOPHAGOGASTRODUODENOSCOPY N/A 07/11/2016   Procedure: ESOPHAGOGASTRODUODENOSCOPY (EGD);  Surgeon: Laurence Spates, MD;  Location: [facility];  Service: Endoscopy;  Laterality: N/A;  . FRACTURE SURGERY    . MULTIPLE TOOTH EXTRACTIONS     teeth removed  . ORIF ANKLE FRACTURE Right 11/12/2012   Procedure: OPEN REDUCTION INTERNAL FIXATION (ORIF) ANKLE FRACTURE;  Surgeon: Marianna Payment, MD;  Location: [facility];  Service: Orthopedics;  Laterality: Right;  Open reduction internal fixation ankle fracture, trimalleolar    There were no vitals filed for this visit.  Subjective Assessment - 02/11/18 1027    Subjective   Pt denies pain, she wants to return to work    Patient Stated Goals  return to work    Currently in Pain?  No/denies        [facility] OT Assessment - 02/11/18 1742      Assessment   Medical Diagnosis  Acute CVA    Referring Provider (OT)  Dr. Delfina Redwood    Onset Date/Surgical Date  01/15/18    Hand Dominance  Right      Precautions   Precautions  Fall    Precaution Comments  no driving      Balance Screen   Has the patient fallen in the past 6 months  No    Has the patient had a decrease in activity level because of a fear of falling?   No    Is the patient reluctant to leave their home because of a fear of falling?   No      Home  Environment   Family/patient expects to be discharged to:  Private residence    Living Arrangements  --   staying with family   Home Layout  Two level    Bathroom Astronomer    Lives With  --   staying with niece and her mother     Prior Function   Level of Independence  Independent    Vocation  Full time employment    Pensions consultant, talking with walkie-talkie, patrol and secure areas    Leisure  Likes to do arts and crafts, decorations      ADL   Eating/Feeding  Independent    Grooming  Modified independent    Upper Body  Bathing  Modified independent    Lower Body Bathing  Modified independent    Upper Body Dressing  Increased time   modified independent   Lower Body Dressing  Modified independent   increased time required   Toilet Transfer  Modified independent    [place] Transfer  Modified independent    ADL comments  Pt is performing basic ADLS modified independently      IADL   Shopping  Assistance for transportation;Needs to be accompanied on any shopping trip    Light Housekeeping  Performs light daily tasks such as dishwashing, bed making    Meal Prep  Able to complete simple warm meal prep   Pt reports cooking breakfast, supervision recommneded   Medication Management  Is responsible for taking medication in correct dosages at correct time    Financial Management  --   Pt reports using online bill pay     Mobility   Mobility Status  Independent      Written Expression   Dominant Hand  Right    Handwriting  100% legible      Vision - History   Baseline Vision  Wears glasses all the time      Vision Assessment   Vision Assessment  Vision not tested      Cognition   Overall Cognitive Status  Impaired/Different from baseline    Attention  Alternating    Alternating Attention  Impaired    Memory  Impaired    Memory Impairment  Decreased recall of new information;Decreased short term memory    Awareness  Impaired    Awareness Impairment  Emergent impairment;Anticipatory impairment    Problem Solving  Impaired    Behaviors  Impulsive      Observation/Other Assessments   Focus on Therapeutic Outcomes (FOTO)   Staff did not capture (pt arrived late for appointment)      Sensation   Light Touch  Appears Intact      Coordination   9 Hole Peg Test  Right;Left    Right 9 Hole Peg Test  27.79 secs    Left 9 Hole Peg Test  35.28 secs    Other  bilateral ataxia, left worse than right      AROM   Overall AROM   Within functional limits  for tasks performed    Overall AROM Comments  ROM WFLS, decreased overall control in LUE  Strength   Overall Strength  Deficits    Overall Strength Comments  RUE grossly 4/5, LUE grossly 3+/5      Hand Function   Right Hand Grip (lbs)  32    Left Hand Grip (lbs)  10                      OT Education - 02/11/18 1816    Education Details  yellow putty HEP for grip and pinch using left and right UE's    Person(s) Educated  Patient    Methods  Explanation;Demonstration;Verbal cues    Comprehension  Verbalized understanding;Returned demonstration;Verbal cues required       OT Short Term Goals - 02/11/18 1744      OT SHORT TERM GOAL #1   Title  I with inital HEP.    Time  4    Period  Weeks    Status  New      OT SHORT TERM GOAL #2   Title  Pt will improve LUE grip strength by 10 lbs for increased LUE functional use during ADLS.    Baseline  RUE 32 lbs, LUE 10 lbs    Time  4    Period  Weeks    Status  New      OT SHORT TERM GOAL #3   Title  Pt will demonstrate improved LUE functional use for ADLS as evidenced by decreasing 9 hole peg test score to 30 secs or less.    Baseline  RUE 27.79 secs, LUE 35.28 secs    Time  4    Period  Weeks    Status  New      OT SHORT TERM GOAL #4   Title  Pt will demonstrate ability to perform basic cooking at a modified independent level demonstrating good safety awareness.    Time  4    Period  Weeks    Status  New      OT SHORT TERM GOAL #5   Title  Pt will demonstrate ability to maintain selective attention to a functional task in a busy environment x 30 mins without redirects.    Time  4    Period  Weeks    Status  New        OT Long Term Goals - 02/11/18 1752      OT LONG TERM GOAL #1   Title  I with updated HEP.    Time  8    Period  Weeks    Status  New      OT LONG TERM GOAL #2   Title  Pt will demonstrate ability to retrieve a 3 lbs item from overhead shelf with LUE demonstrating good control.     Time  8    Period  Weeks    Status  New      OT LONG TERM GOAL #3   Title  Pt will demonstrate ability to perform a physical and cognitive task simultaneously with 90% or better accuracy in prep for driving and work activities.    Time  8    Period  Weeks    Status  New      OT LONG TERM GOAL #4   Title  Pt will perfrom simulated work activities at a modified independent level.    Time  8    Period  Weeks    Status  New      OT LONG TERM GOAL #5  Title  Pt will perform mod complex home management at a modified independent level.    Time  8    Period  Weeks    Status  New      Long Term Additional Goals   Additional Long Term Goals  Yes      OT LONG TERM GOAL #6   Title  Pt will demonstrate ability to perform mod complex cooking (ie: 2 items at 1 time )modified independently demonstrating good safety awareness.    Time  8    Period  Weeks    Status  New            Plan - 02/11/18 1807    Clinical Impression Statement  Pt is a 62 y.o female admitted 01/15/18 with weakness in left arm and leg as well as slurred speech. MRI revealed subacute infarct within right pons.  Pt presents to occupational therapy with the following deficits: decreased coordination, decreased strength, decreased balance, cognitve deficits which impede performance of ADLS/IADLS. Pt can benefit from skilled occupational therapy to maximize safety and independence with daily activities.    Occupational Profile and client history currently impacting functional performance  PMH: DM II, HTN, CAD s/p percutaneous angioplasty, hx of GI bleed, tobacco use, Pt was completely I and working full time as [employer], Pt is currently living with family and requires assistance for transportation.    Occupational performance deficits (Please refer to evaluation for details):  ADL's;IADL's;Work;Play;Leisure;Social Participation    Rehab Potential  Good    Current Impairments/barriers affecting progress:   cognitive deficits, impulsivity, decreased awareness, decreased coordination    OT Frequency  3x / week   anticipate dropping down to 2x week following the inital 4 weeks, plus eval   OT Duration  8 weeks    OT Treatment/Interventions  Self-care/ADL training;Therapeutic exercise;Gait Training;Aquatic Therapy;Moist Heat;Paraffin;Neuromuscular education;Splinting;Patient/family education;Balance training;Therapeutic activities;Functional Mobility Training;Energy conservation;Fluidtherapy;Ultrasound;Cryotherapy;Contrast Bath;DME and/or AE instruction;Manual Therapy;Passive range of motion;Cognitive remediation/compensation    Plan  coordination HEP for LUE, review putty ex     Clinical Decision Making  Limited treatment options, no task modification necessary    OT Home Exercise Plan  issued yellow putty HEP    Consulted and Agree with Plan of Care  Patient       Patient will benefit from skilled therapeutic intervention in order to improve the following deficits and impairments:  Abnormal gait, Decreased mobility, Decreased coordination, Decreased range of motion, Decreased strength, Impaired UE functional use, Impaired perceived functional ability, Difficulty walking, Decreased safety awareness, Decreased knowledge of precautions, Decreased balance  Visit Diagnosis: Other lack of coordination - Plan: Ot plan of care cert/re-cert  Frontal lobe and executive function deficit - Plan: Ot plan of care cert/re-cert  Other abnormalities of gait and mobility - Plan: Ot plan of care cert/re-cert  Muscle weakness (generalized) - Plan: Ot plan of care cert/re-cert    Problem List Patient Active Problem List   Diagnosis Date Noted  . Acute ischemic stroke ([place]) 01/16/2018  . Acute CVA (cerebrovascular accident) ([place]) 01/16/2018  . GI bleed 07/10/2016  . Morbid obesity due to excess calories ([place]) 05/14/2016  . Dyspnea on exertion 04/29/2016  . Abnormal nuclear stress test   . CAD S/P  percutaneous coronary angioplasty 02/08/2016  . INSOMNIA, CHRONIC 02/28/2010  . HEADACHE 02/28/2010  . HEEL PAIN, RIGHT 11/29/2009  . COUGH DUE TO ACE INHIBITORS 11/29/2009  . ATRIAL ENLARGEMENT, LEFT 03/21/2009  . ANEMIA 12/28/2008  . CARDIAC MURMUR 12/28/2008  .  OTHER NONSPECIFIC ABNORMAL SERUM ENZYME LEVELS 12/28/2008  . CERVICAL MUSCLE STRAIN 12/28/2008  . CONSTIPATION 06/01/2008  . POSTMENOPAUSAL STATUS 05/15/2008  . BRONCHITIS, CHRONIC 12/18/2007  . Cigarette smoker 08/19/2007  . DEGENERATIVE DISC DISEASE, LUMBOSACRAL SPINE 08/19/2007  . LOW BACK PAIN SYNDROME 08/19/2007  . UPPER RESPIRATORY INFECTION, VIRAL 03/05/2007  . NEOPLASM, SKIN, UNCERTAIN BEHAVIOR 28/31/5176  . GANGLION CYST, WRIST, LEFT 01/28/2007  . DENTAL PAIN 01/19/2007  . DM (diabetes mellitus), type 2, uncontrolled with complications ([place]) 16/09/3708  . Hyperlipidemia with target LDL less than 70 12/10/2006  . Essential hypertension 12/10/2006  . RHINITIS, ALLERGIC NEC 12/10/2006  . HIP PAIN, RIGHT 12/10/2006    Cynthia Cogle 02/11/2018, 6:23 PM Theone Murdoch, OTR/L [phone] Phone: [phone] 6:23 PM 02/11/18 [facility] [address] [place], [place], [address] Phone: [phone]   Fax:  [phone]  Name: Leah Norton MRN: [id number] Date of Birth: June 02, 1955

## 2018-02-11 NOTE — Patient Instructions (Signed)
In speech therapy we will work on your communication (speech) and also cognitive skills.   Memory Compensation Strategies  1. Use "WARM" strategy. W= write it down A=  associate it R=  repeat it M=  make a mental picture  2. You can keep a Social worker. Use a 3-ring notebook with sections for the following:  calendar, important names and phone numbers, medications, doctors' names/phone numbers, "to do list"/reminders, and a section to journal what you did each day  3. Use a calendar to write appointments down.  4. Write yourself a schedule for the day.  This can be placed on the calendar or in a separate section of the Memory Notebook.  Keeping a regular schedule can help memory.  5. Use medication organizer with sections for each day or morning/evening pills  You may need help loading it  6. Keep a basket, or pegboard by the door.   Place items that you need to take out with you in the basket or on the pegboard.  You may also want to include a message board for reminders.  7. Use sticky notes. Place sticky notes with reminders in a place where the task is performed.  For example:  "turn off the stove" placed by the stove, "lock the door" placed on the door at eye level, "take your medications" on the bathroom mirror or by the place where you normally take your medications  8. Use alarms/timers.  Use while cooking to remind yourself to check on food or as a reminder to take your medicine, or as a reminder to make a call, or as a reminder to perform another task, etc.  9. Use a small tape recorder to record important information and notes for yourself.

## 2018-02-13 ENCOUNTER — Ambulatory Visit: Payer: BC Managed Care – PPO | Admitting: Physical Therapy

## 2018-02-13 ENCOUNTER — Ambulatory Visit: Payer: BC Managed Care – PPO | Admitting: Speech Pathology

## 2018-02-13 DIAGNOSIS — R2681 Unsteadiness on feet: Secondary | ICD-10-CM

## 2018-02-13 DIAGNOSIS — R471 Dysarthria and anarthria: Secondary | ICD-10-CM

## 2018-02-13 DIAGNOSIS — I69923 Fluency disorder following unspecified cerebrovascular disease: Secondary | ICD-10-CM

## 2018-02-13 DIAGNOSIS — M6281 Muscle weakness (generalized): Secondary | ICD-10-CM

## 2018-02-13 DIAGNOSIS — R278 Other lack of coordination: Secondary | ICD-10-CM | POA: Diagnosis not present

## 2018-02-13 DIAGNOSIS — R41841 Cognitive communication deficit: Secondary | ICD-10-CM

## 2018-02-13 DIAGNOSIS — R2689 Other abnormalities of gait and mobility: Secondary | ICD-10-CM

## 2018-02-13 NOTE — Therapy (Signed)
[facility] [address] [place], [place], [address] Phone: [phone]   Fax:  [phone]  Speech Language Pathology Treatment  Patient Details  Name: Leah Norton MRN: [id number] Date of Birth: [birth year] Referring Provider (SLP): Seward Carol, MD   Encounter Date: 02/13/2018  End of Session - 02/13/18 1217    Visit Number  2    Number of Visits  21    Date for SLP Re-Evaluation  04/12/18    Authorization Type  BCBS    SLP Start Time  74    SLP Stop Time   1100    SLP Time Calculation (min)  41 min    Activity Tolerance  Patient tolerated treatment well       Past Medical History:  Diagnosis Date  . Anginal pain ([place])   . Arthritis    "right anikle" (02/29/2016)  . CAD in native artery 02/08/2016   Three-vessel coronary artery calcification on CT scan  . Complication of anesthesia    "had reaction w/hives, rash, itching" (02/29/2016)  . Constipation   . Coronary artery disease    12/17 PCI with DESx1 to pLAD EF normal  . Enlarged heart   . GERD (gastroesophageal reflux disease)   . High cholesterol   . Hypertension   . Migraine    hx  . Symptomatic anemia 07/10/2016  . Type II diabetes mellitus ([place])     Past Surgical History:  Procedure Laterality Date  . ABDOMINAL HYSTERECTOMY    . CARDIAC CATHETERIZATION N/A 02/29/2016   Procedure: Left Heart Cath and Coronary Angiography;  Surgeon: Belva Crome, MD;  Location: [facility];  Service: Cardiovascular;  Laterality: N/A;  . CARDIAC CATHETERIZATION N/A 02/29/2016   Procedure: Coronary Stent Intervention;  Surgeon: Belva Crome, MD;  Location: [facility];  Service: Cardiovascular;  Laterality: N/A;  . CARDIAC CATHETERIZATION  2005   "no blockages"  . COLONOSCOPY WITH PROPOFOL N/A 03/15/2014   Procedure: COLONOSCOPY WITH PROPOFOL;  Surgeon: Garlan Fair, MD;  Location: [facility];  Service: Endoscopy;  Laterality: N/A;   . CORONARY ANGIOPLASTY WITH STENT PLACEMENT    . ESOPHAGOGASTRODUODENOSCOPY N/A 07/11/2016   Procedure: ESOPHAGOGASTRODUODENOSCOPY (EGD);  Surgeon: Laurence Spates, MD;  Location: [facility];  Service: Endoscopy;  Laterality: N/A;  . FRACTURE SURGERY    . MULTIPLE TOOTH EXTRACTIONS     teeth removed  . ORIF ANKLE FRACTURE Right 11/12/2012   Procedure: OPEN REDUCTION INTERNAL FIXATION (ORIF) ANKLE FRACTURE;  Surgeon: Marianna Payment, MD;  Location: [facility];  Service: Orthopedics;  Laterality: Right;  Open reduction internal fixation ankle fracture, trimalleolar    There were no vitals filed for this visit.  Subjective Assessment - 02/13/18 1200    Subjective  "did you send your notes to my doctor?"    Currently in Pain?  No/denies            ADULT SLP TREATMENT - 02/13/18 1019      General Information   Behavior/Cognition  Alert;Cooperative;Impulsive      Treatment Provided   Treatment provided  Cognitive-Linquistic      Pain Assessment   Pain Assessment  No/denies pain      Cognitive-Linquistic Treatment   Treatment focused on  Cognition;Dysarthria    Skilled Treatment  Pt eager to discuss testing completed last session, so SLP reviewed CLQT and deficit areas with pt, explaining potential functional impacts of attention, visuospatial, executive function deficits at home and work.  Reviewed goals for dysarthria, fluency and cognition. SLP worked with pt to develop personally relevant phrases/sentences for speech practice by asking pt about how she would respond verbally in different work scenarios. Introduced compensations for dysarthria (SLOP) and pt successfully imitated these strategies with multisyllabic words with occasional min A. Usual mod cues required for pt's personally relevant phrases/sentences. SLP instructed pt to bring her list of public safety codes from home for speech practice. Cog tx (18 min) While generating everyday phrases, pt usually responded by talking about  what she would do vs what she would say, requiring frequent redirection and modeling to generate appropriate responses. Pt brought a calendar printout. SLP provided verbal education and handout re: use of a calendar, and showed pt an example of a weekly planner to record daily information. Pt requested a copy of weekly planner worksheet to record a reminder to bring items from home. Pt also recorded dates, upcoming events on her weekly planner, with min cues occasionally for appropriate location. Pt used her therapy schedule printout to determine dates/times of upcoming therapy appointments (mod cues for attention to detail).       Assessment / Recommendations / Plan   Plan  Continue with current plan of care      Progression Toward Goals   Progression toward goals  Progressing toward goals       SLP Education - 02/13/18 1216    Education Details  deficit areas, use of a calendar, dysarthria/fluency compensations    Person(s) Educated  Patient    Methods  Explanation;Demonstration;Verbal cues;Handout    Comprehension  Verbalized understanding;Returned demonstration;Verbal cues required;Need further instruction       SLP Short Term Goals - 02/13/18 1031      SLP SHORT TERM GOAL #1   Title  Pt will respond with personally relevant sentences 18/20 with [facility] speech fluidity over three sessions with use of compensations (slow rate)    Time  4    Period  Weeks    Status  On-going      SLP SHORT TERM GOAL #2   Title  Pt will use a memory compensation system to record, reference and organize daily activities/appointments with occasional min A x 2 sessions     Time  4    Period  Weeks    Status  On-going      SLP SHORT TERM GOAL #3   Title  Pt will tell SLP 2 cognitive deficits that would impact her at work.    Time  4    Period  Weeks    Status  On-going      SLP SHORT TERM GOAL #4   Title  Pt will demo attention to detail by recalling pertinent information from 3-5 sentence auditory  messages 85% accuracy x 3 sessions (verbal/ written summary or responding to questions)    Time  4    Period  Weeks    Status  On-going       SLP Long Term Goals - 02/13/18 1224      SLP LONG TERM GOAL #1   Title  Using compensatory strategies for fluency/dysarthria, pt will functionally ask investigative questions or give a verbal report of functional scenarios over 4 sessions    Time  8    Period  Weeks    Status  On-going      SLP LONG TERM GOAL #2   Title  Pt will demo WFL fluidity of speech in 10 minutes mod to complex/complex conversation with  modified independence outside of ST room x 3 sessions    Time  8    Period  Weeks    Status  On-going      SLP LONG TERM GOAL #3   Title  pt will demo functional executive function (e.g., planning, organization) ability in personally relevant tasks (written and/or auditorily provided) 90% of the time     Time  8    Period  Weeks    Status  On-going      SLP LONG TERM GOAL #4   Title  Pt will demo 15 minutes alternating attention between two mod complex cognitive tasks to achieve >85% success on each task over two sessions    Time  8    Period  Weeks    Status  On-going      SLP LONG TERM GOAL #5   Title  pt will demo WNL emergent awareness with mod complex tasks in order to achieve >95% success over three sessions     Time  8    Period  Weeks    Status  On-going       Plan - 02/13/18 1217    Clinical Impression Statement  Patient presents with moderate cognitive communication deficits, mild dysarthria and frequent disfluencies in conversation (initial syllable repetition, typically, though occasional substitutions noted). Impairements in alternating attn, attn to detail, executive function, recall, and awareness noted today, as well as impulsivity. 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 Deficits in attention, recall, and organization would  hinder her ability to complete job duties at this time, including completing investigations, dictation, and writing reports. I recommend skilled ST to maximize cognitive and communicative function for possible return to work and improved independence and QOL    Speech Therapy Frequency  3x / week   3x per week for 4 weeks, then 2x per week for 4 weeks   Duration  --   8 weeks or 21 visits total   Treatment/Interventions  Cognitive reorganization;Multimodal communcation approach;Compensatory strategies;Compensatory techniques;Cueing hierarchy;Internal/external aids;Functional tasks;SLP instruction and feedback;Patient/family education    Potential to Achieve Goals  Good       Patient will benefit from skilled therapeutic intervention in order to improve the following deficits and impairments:   Dysarthria and anarthria  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 Acute ischemic stroke ([place]) 01/16/2018  . Acute CVA (cerebrovascular accident) ([place]) 01/16/2018  . GI bleed 07/10/2016  . Morbid obesity due to excess calories ([place]) 05/14/2016  . Dyspnea on exertion 04/29/2016  . Abnormal nuclear stress test   . CAD S/P percutaneous coronary angioplasty 02/08/2016  . INSOMNIA, CHRONIC 02/28/2010  . HEADACHE 02/28/2010  . HEEL PAIN, RIGHT 11/29/2009  . COUGH DUE TO ACE INHIBITORS 11/29/2009  . ATRIAL ENLARGEMENT, LEFT 03/21/2009  . ANEMIA 12/28/2008  . CARDIAC MURMUR 12/28/2008  . OTHER NONSPECIFIC ABNORMAL SERUM ENZYME LEVELS 12/28/2008  . CERVICAL MUSCLE STRAIN 12/28/2008  . CONSTIPATION 06/01/2008  . POSTMENOPAUSAL STATUS 05/15/2008  . BRONCHITIS, CHRONIC 12/18/2007  . Cigarette smoker 08/19/2007  . DEGENERATIVE DISC DISEASE, LUMBOSACRAL SPINE 08/19/2007  . LOW BACK PAIN SYNDROME 08/19/2007  . UPPER RESPIRATORY INFECTION, VIRAL 03/05/2007  . NEOPLASM, SKIN,  UNCERTAIN BEHAVIOR 16/09/3708  . GANGLION CYST, WRIST, LEFT 01/28/2007  . DENTAL PAIN 01/19/2007  . DM (diabetes  mellitus), type 2, uncontrolled with complications ([place]) 07/37/1062  . Hyperlipidemia with target LDL less than 70 12/10/2006  . Essential hypertension 12/10/2006  . RHINITIS, ALLERGIC NEC 12/10/2006  . HIP PAIN, RIGHT 12/10/2006   Deneise Lever, [place], [place] Speech-Language Pathologist  Aliene Altes 02/13/2018, 12:26 PM  [facility] [address] [place], [place], [address] Phone: [phone]   Fax:  [phone]   Name: Leah Norton MRN: [id number] Date of Birth: 02-07-1956

## 2018-02-13 NOTE — Patient Instructions (Signed)
Bring your notebook with the public safety codes you use. We will use those to practice your speech. Bring some blank copies of some of your paperwork because we will use those to work on your report writing.  Use your weekly planner every day: see below for suggestions of what to record in your planner and how to use it.  Using a Calendar System to Help Your Memory  . Carry your calendar with you at all times . Decide on a place for your calendar o Choose a place to keep your calendar at home o Put it back in the same place when you finish recording information or when you return home . Review your calendar with someone. o It may be helpful for someone to give you advice about information to record.  . Record information at the time it occurs.  o Don't wait until later to write down information o Ask others for a moment to record in your calendar . Review your calendar at least twice a day. o Morning: check plans, write "To Do" list o Evening: review day, make notes, plan for next day, transfer incomplete tasks to another day . Write in all appointments. o Eg. doctor, dentist, school, hairdresser . Write in weekly scheduled events. o Record things you do each week such as: your therapy schedule, exercise class, garbage day, Sunday school o Add scheduled events for things you have trouble remembering, such as: completing exercises, making phone calls, reviewing your budget . Write in upcoming events. o Birthdays, holidays, family activities, social events, bills due . Write a "To Do" list every day. o Include items you need to do and items you are asked to do o Check items off ONLY after you do them o Record things you can achieve on that day. Look to another day if you are too busy. . Record details from your day. o Write down information from conversations so you can follow up later. o Record information from appointments so you can remember what was said. Marland Kitchen Reference your calendar  throughout the day. o Check off items on your "To Do" list o Check your schedule . Plan out larger tasks. o Break down into steps and write in your calendar to make the task more manageable . Review your calendar every few days. o Make sure you have transferred "To-Do" items have not completed to another day o Review the events you've recording and try to remember some details . Review your calendar at the end of the month. o Copy events to the next month.   Speech Practice: 15 minutes, twice per day. Remember:  SLOW LOUD OVER-ENNUNCIATE PAUSE   Say each 5x Slow and Big - make each sound distinct  [place]  BUTTERCUP  CATERPILLAR  BASEBALLL PLAYER  [place]  [place] [employer]  RED LEATHER YELLOW LEATHER  UNIQUE [place]  THREE FREE THROWS  FLASH MESSAGE  CINNAMON LINOLEUM ALUMINUM  I'm Officer Shelton.  You need to get back away from the building.  You need to step back 50 feet from the area.  Communication, I need assistance to a sick call.   I need assistance to a 1085.   10-4, I'll be on the lookout.  SLOW AND BIG - EXAGGERATE YOUR MOUTH, MAKE EACH CONSONANT

## 2018-02-14 NOTE — Therapy (Signed)
[facility] [address] [place], [place], [address] Phone: [phone]   Fax:  [phone]  Physical Therapy Treatment  Patient Details  Name: Leah Norton MRN: [id number] Date of Birth: 11/17/1955 Referring Provider (PT): Seward Carol, MD   Encounter Date: 02/13/2018  PT End of Session - 02/14/18 2052    Visit Number  2    Number of Visits  23    Date for PT Re-Evaluation  05/12/18    Authorization Type  State BCBS    PT Start Time  1108    PT Stop Time  1149    PT Time Calculation (min)  41 min    Activity Tolerance  Patient tolerated treatment well    Behavior During Therapy  [facility] for tasks assessed/performed       Past Medical History:  Diagnosis Date  . Anginal pain ([place])   . Arthritis    "right anikle" (02/29/2016)  . CAD in native artery 02/08/2016   Three-vessel coronary artery calcification on CT scan  . Complication of anesthesia    "had reaction w/hives, rash, itching" (02/29/2016)  . Constipation   . Coronary artery disease    12/17 PCI with DESx1 to pLAD EF normal  . Enlarged heart   . GERD (gastroesophageal reflux disease)   . High cholesterol   . Hypertension   . Migraine    hx  . Symptomatic anemia 07/10/2016  . Type II diabetes mellitus ([place])     Past Surgical History:  Procedure Laterality Date  . ABDOMINAL HYSTERECTOMY    . CARDIAC CATHETERIZATION N/A 02/29/2016   Procedure: Left Heart Cath and Coronary Angiography;  Surgeon: Belva Crome, MD;  Location: [facility];  Service: Cardiovascular;  Laterality: N/A;  . CARDIAC CATHETERIZATION N/A 02/29/2016   Procedure: Coronary Stent Intervention;  Surgeon: Belva Crome, MD;  Location: [facility];  Service: Cardiovascular;  Laterality: N/A;  . CARDIAC CATHETERIZATION  2005   "no blockages"  . COLONOSCOPY WITH PROPOFOL N/A 03/15/2014   Procedure: COLONOSCOPY WITH PROPOFOL;  Surgeon: Garlan Fair, MD;  Location:  [facility];  Service: Endoscopy;  Laterality: N/A;  . CORONARY ANGIOPLASTY WITH STENT PLACEMENT    . ESOPHAGOGASTRODUODENOSCOPY N/A 07/11/2016   Procedure: ESOPHAGOGASTRODUODENOSCOPY (EGD);  Surgeon: Laurence Spates, MD;  Location: [facility];  Service: Endoscopy;  Laterality: N/A;  . FRACTURE SURGERY    . MULTIPLE TOOTH EXTRACTIONS     teeth removed  . ORIF ANKLE FRACTURE Right 11/12/2012   Procedure: OPEN REDUCTION INTERNAL FIXATION (ORIF) ANKLE FRACTURE;  Surgeon: Marianna Payment, MD;  Location: [facility];  Service: Orthopedics;  Laterality: Right;  Open reduction internal fixation ankle fracture, trimalleolar    There were no vitals filed for this visit.  Subjective Assessment - 02/14/18 2045    Subjective  No new changes since eval.    Pertinent History  PMH includes essential HTN, hyperlipidemia, DM, DDD lumbo-sacral spine; Hx of CVA 01/15/18    Patient Stated Goals  Pt's goals for physical therapy are to do whatever I can to return to normal.    Currently in Pain?  No/denies         [facility] PT Assessment - 02/14/18 0001      6 Minute Walk- Baseline   6 Minute Walk- Baseline  yes    HR (bpm)  86    02 Sat (%RA)  97 %      6 Minute walk- Post Test  6 Minute Walk Post Test  yes    HR (bpm)  92    02 Sat (%RA)  96 %    Modified Borg Scale for Dyspnea  1- Very mild shortness of breath    Perceived Rate of Exertion (Borg)  13- Somewhat hard      6 minute walk test results    Aerobic Endurance Distance Walked  866    Endurance additional comments  Normal values for community dwelling adults 48-51 year olds= 1765 ft            Therapeutic Exercise:     Sit<>stand 15 reps from 20" mat (recurvatum noted L knee, with PT's tactile cues to prevent recurvatum).  Attempted sit<>stand from 18" chair, pt with difficulty with instructions for correct technique (to end in slight knee flexion bilaterally to avoid knee recurvatum), x 8 reps.  Squats at counter x 8 reps, with verbal,  tactile cues for correct technique and to avoid L knee recurvatum.  Attempted L hamstring curls 10 reps, cues for control, pace, and technique  Standing marching in place x 10 reps, alternating hip abduction x 10 reps, alternating hip extension x 10 reps  Sidestepping along counter 3 reps R and L, cues for foot placement, to avoid hip external rotation  Tandem stance 2 reps 10 seconds, each foot position, with cues for glut activation.            PT Education - 02/14/18 2051    Education Details  HEP initiated-see instructions    Person(s) Educated  Patient    Methods  Explanation;Demonstration;Handout    Comprehension  Verbalized understanding;Returned demonstration;Verbal cues required       PT Short Term Goals - 02/14/18 2057      PT SHORT TERM GOAL #1   Title  Pt will be independent with HEP for improved strength, balance, gait.  TARGET 03/14/18    Time  5    Period  Weeks      PT SHORT TERM GOAL #2   Title  Pt will improve DGI score to at least 21/24 for decreased fall risk.    Time  5    Period  Weeks    Status  New      PT SHORT TERM GOAL #3   Title  Pt to improve 6 minute walk test goal by at least 150 ft for improved gait efficiency in community.    Baseline  02/13/18:  866 ft in 6MWT    Time  5    Period  Weeks    Status  Revised      PT SHORT TERM GOAL #4   Title  Pt will verbalize understanding of CVA warning signs and symptoms.    Time  5    Period  Weeks    Status  New        PT Long Term Goals - 02/11/18 1459      PT LONG TERM GOAL #1   Title  Pt will verbalize understanding of fall prevention in the home environment.  TARGET 04/11/18    Time  9    Period  Weeks    Status  New    Target Date  04/11/18      PT LONG TERM GOAL #2   Title  Pt will improve 5x sit<>stand score to less than or equal to 11.4 seconds for improved functional strength and transfer efficiency.    Time  9    Period  Weeks  Status  New    Target Date  04/11/18       PT LONG TERM GOAL #3   Title  Pt will improve gait velocity to at least 2.62 ft/sec for improved gait efficiency and safety.    Time  9    Period  Weeks    Status  New    Target Date  04/11/18      PT LONG TERM GOAL #4   Title  Pt will ambulate at least 1000 ft, indoor and outdoor surfaces, independently no LOB, for improved gait negotiation outdoor surfaces.    Time  9    Period  Weeks    Status  New    Target Date  04/11/18            Plan - 02/14/18 2052    Clinical Impression Statement  Performed 6 minute walk test this visit, with pt ambulating 866 ft in 6 minutes, well below 1765 ft normal value for 92-70 year old females (gym is crowded and pt does have occasional conversation, but overall gait pattern is quite slowed).  She has occasions of near-veering to furniture on R with gait.  Initiated HEP this visit to address strength and balance-pt noted to have L knee recurvatum with standing activities, and pt has difficulty with awareness of this movement pattern.  Pt also has difficulty with proper technique of standing strengthening exercises, with tactile and verbal cues for slowed pace and correct technique.  Pt will benefit from further skilled PT to address strength, balance, and gait for overall improved functional mobility and independence.    Rehab Potential  Good    PT Frequency  Other (comment)   1x/wk in addition to eval; then 3x/wk for 4 weeks, 2x/wk for 4 weeks   PT Duration  --   total POC = 9 weeks   PT Treatment/Interventions  ADLs/Self Care Home Management;Gait training;Functional mobility training;Therapeutic activities;Therapeutic exercise;Balance training;Patient/family education;Neuromuscular re-education    PT Next Visit Plan  Review HEP initiated this visit, further compliant surface balance assessment (check EO/EC on foam); gait training work on increased L step length and control of L knee recurvatum    Consulted and Agree with Plan of Care  Patient        Patient will benefit from skilled therapeutic intervention in order to improve the following deficits and impairments:  Abnormal gait, Decreased balance, Decreased mobility, Difficulty walking, Decreased strength  Visit Diagnosis: Other abnormalities of gait and mobility  Muscle weakness (generalized)  Unsteadiness on feet     Problem List Patient Active Problem List   Diagnosis Date Noted  . Acute ischemic stroke ([place]) 01/16/2018  . Acute CVA (cerebrovascular accident) ([place]) 01/16/2018  . GI bleed 07/10/2016  . Morbid obesity due to excess calories ([place]) 05/14/2016  . Dyspnea on exertion 04/29/2016  . Abnormal nuclear stress test   . CAD S/P percutaneous coronary angioplasty 02/08/2016  . INSOMNIA, CHRONIC 02/28/2010  . HEADACHE 02/28/2010  . HEEL PAIN, RIGHT 11/29/2009  . COUGH DUE TO ACE INHIBITORS 11/29/2009  . ATRIAL ENLARGEMENT, LEFT 03/21/2009  . ANEMIA 12/28/2008  . CARDIAC MURMUR 12/28/2008  . OTHER NONSPECIFIC ABNORMAL SERUM ENZYME LEVELS 12/28/2008  . CERVICAL MUSCLE STRAIN 12/28/2008  . CONSTIPATION 06/01/2008  . POSTMENOPAUSAL STATUS 05/15/2008  . BRONCHITIS, CHRONIC 12/18/2007  . Cigarette smoker 08/19/2007  . DEGENERATIVE DISC DISEASE, LUMBOSACRAL SPINE 08/19/2007  . LOW BACK PAIN SYNDROME 08/19/2007  . UPPER RESPIRATORY INFECTION, VIRAL 03/05/2007  . NEOPLASM, SKIN, UNCERTAIN BEHAVIOR  01/28/2007  . GANGLION CYST, WRIST, LEFT 01/28/2007  . DENTAL PAIN 01/19/2007  . DM (diabetes mellitus), type 2, uncontrolled with complications ([place]) 88/64/8472  . Hyperlipidemia with target LDL less than 70 12/10/2006  . Essential hypertension 12/10/2006  . RHINITIS, ALLERGIC NEC 12/10/2006  . HIP PAIN, RIGHT 12/10/2006    Khyrie Masi W. 02/14/2018, 8:59 PM  Frazier Butt., PT   [facility] [address] [place], [place], [address] Phone: [phone]   Fax:  [phone]  Name: Leah Norton MRN: [id number] Date of Birth: [birth year]

## 2018-02-14 NOTE — Patient Instructions (Signed)
Written instructions given (as Epic computer and Internet down during PT session):  -Sit to stand x 10 reps  -Hip kicks to side x 10 reps -Hip kicks to back x 10 reps -marching in place x 10 reps  Performed 1-2 times per day Standing at counter for support

## 2018-02-17 NOTE — Progress Notes (Signed)
CARDIOLOGY OFFICE NOTE  Date:  02/18/2018    [name] Date of Birth: [birth year] Medical Record #[id number]  PCP:  Seward Carol, MD  Cardiologist:  Tamala Julian  Chief Complaint  Patient presents with  . Coronary Artery Disease    Follow up visit - seen for Dr.Smith    History of Present Illness: Leah Norton is a 62 y.o. female who presents today for a follow up visit. Seen for Dr.Smith.   She has a history of diabetes, HLD, hypertension, tobacco abuse, nonobstructive coronary disease by angiography in 2005. She had an abnormal Myoview in 2017 - demonstrated evidence of ischemia. Subsequent catheterization demonstrated 75-85% proximal LAD which was stented with DES. Brilinta caused dyspnea and was changed to Plavix.   Last seen in February of 2019 - multiple complaints - not felt to be cardiac. Her Plavix was stopped at that time.   Presented last month with slurred speech and left sided weakness. MRI brain reveals subcentimeter acute or early subacute infarction within the right pons, but no hemorrhage or mass-effect. She was treated with aspirin and Plavix for 3 weeks then to transition to Plavix alone. Echo was updated - has had reduction in her EF - held off on restarting her ARB for 'ermissive HTN for 5 to 7 days and then hoped to restart slowly.   Comes in today. Here alone. She feels like she is making progress since the stroke. No chest pain. Breathing is ok. She saw her PCP last week - her losartan and her atenolol were restarted at full dose. She remains off of her HCTZ. She is asking about a pill for "fluid". She admits she is eating more since she has stopped smoking. She remains on both aspirin and Plavix. No bleeding that she is aware of. She is still getting her stroke therapy. She is asking about returning to work/driving, etc. Sees neurology next week. She took her last dose of Plavix today and is asking for a refill.   Past Medical History:  Diagnosis  Date  . Anginal pain ([place])   . Arthritis    "right anikle" (02/29/2016)  . CAD in native artery 02/08/2016   Three-vessel coronary artery calcification on CT scan  . Complication of anesthesia    "had reaction w/hives, rash, itching" (02/29/2016)  . Constipation   . Coronary artery disease    12/17 PCI with DESx1 to pLAD EF normal  . Enlarged heart   . GERD (gastroesophageal reflux disease)   . High cholesterol   . Hypertension   . Migraine    hx  . Symptomatic anemia 07/10/2016  . Type II diabetes mellitus ([place])     Past Surgical History:  Procedure Laterality Date  . ABDOMINAL HYSTERECTOMY    . CARDIAC CATHETERIZATION N/A 02/29/2016   Procedure: Left Heart Cath and Coronary Angiography;  Surgeon: Belva Crome, MD;  Location: [facility];  Service: Cardiovascular;  Laterality: N/A;  . CARDIAC CATHETERIZATION N/A 02/29/2016   Procedure: Coronary Stent Intervention;  Surgeon: Belva Crome, MD;  Location: [facility];  Service: Cardiovascular;  Laterality: N/A;  . CARDIAC CATHETERIZATION  2005   "no blockages"  . COLONOSCOPY WITH PROPOFOL N/A 03/15/2014   Procedure: COLONOSCOPY WITH PROPOFOL;  Surgeon: Garlan Fair, MD;  [facility];  Service: Endoscopy;  Laterality: N/A;  . CORONARY ANGIOPLASTY WITH STENT PLACEMENT    . ESOPHAGOGASTRODUODENOSCOPY N/A 07/11/2016   Procedure: ESOPHAGOGASTRODUODENOSCOPY (EGD);  Surgeon: Laurence Spates,  MD;  Location: [facility];  Service: Endoscopy;  Laterality: N/A;  . FRACTURE SURGERY    . MULTIPLE TOOTH EXTRACTIONS     teeth removed  . ORIF ANKLE FRACTURE Right 11/12/2012   Procedure: OPEN REDUCTION INTERNAL FIXATION (ORIF) ANKLE FRACTURE;  Surgeon: Marianna Payment, MD;  Location: [facility];  Service: Orthopedics;  Laterality: Right;  Open reduction internal fixation ankle fracture, trimalleolar     Medications: Current Meds  Medication Sig  . aspirin 81 MG chewable tablet Chew 81 mg by mouth daily.  Marland Kitchen buPROPion  (WELLBUTRIN SR) 150 MG 12 hr tablet Take 150 mg by mouth 2 (two) times daily.   . Cholecalciferol (VITAMIN D3) 1000 units CAPS Take 2,000 Units by mouth daily.  . clopidogrel (PLAVIX) 75 MG tablet Take 1 tablet (75 mg total) by mouth daily.  . nitroGLYCERIN (NITROSTAT) 0.4 MG SL tablet Place 0.4 mg under the tongue every 5 (five) minutes as needed for chest pain (Call 911 at 3rd dose within 15 minutes.).  Marland Kitchen pantoprazole (PROTONIX) 40 MG tablet Take 40 mg by mouth daily.  . rosuvastatin (CRESTOR) 10 MG tablet Take 10 mg by mouth daily.  . sitaGLIPtin-metformin (JANUMET) 50-1000 MG per tablet Take 1 tablet by mouth 2 (two) times daily with a meal.  . [DISCONTINUED] clopidogrel (PLAVIX) 75 MG tablet Take 1 tablet (75 mg total) by mouth daily.  . [DISCONTINUED] Cod Liver Oil CAPS Take 1 capsule by mouth once a week. One to two times a day      Allergies: Allergies  Allergen Reactions  . Codeine Rash  . Erythromycin Rash  . Penicillins Rash    Has patient had a PCN reaction causing immediate rash, facial/tongue/throat swelling, SOB or lightheadedness with hypotension: Yes Has patient had a PCN reaction causing severe rash involving mucus membranes or skin necrosis: No Has patient had a PCN reaction that required hospitalization: No Has patient had a PCN reaction occurring within the last 10 years: Yes If all of the above answers are "NO", then may proceed with Cephalosporin use.   . Sulfonamide Derivatives Rash  . Vicodin [Hydrocodone-Acetaminophen] Rash    Social History: The patient  reports that she has been smoking cigarettes. She has a 21.00 pack-year smoking history. She has never used smokeless tobacco. She reports that she drinks alcohol. She reports that she does not use drugs.   Family History: The patient's family history includes Hypertension in her mother.   Review of Systems: Please see the history of present illness.   Otherwise, the review of systems is positive for  none.   All other systems are reviewed and negative.   Physical Exam: VS:  BP 128/78   Pulse 82   Ht 5\' 5"  (1.651 m)   Wt 184 lb 6.4 oz (83.6 kg)   SpO2 97%   BMI 30.69 kg/m  .  BMI Body mass index is 30.69 kg/m.  Wt Readings from Last 3 Encounters:  02/18/18 184 lb 6.4 oz (83.6 kg)  01/16/18 177 lb 7.5 oz (80.5 kg)  04/15/17 184 lb 3.2 oz (83.6 kg)    General: Pleasant. Alert and in no acute distress. Some slurred speech noted.    HEENT: Normal.  Neck: Supple, no JVD, carotid bruits, or masses noted.  Cardiac: Regular rate and rhythm. No murmurs, rubs, or gallops. No edema.  Respiratory:  Lungs are clear to auscultation bilaterally with normal work of breathing.  GI: Soft and nontender.  MS: No deformity or atrophy. Gait and  ROM intact.  Skin: Warm and dry. Color is normal.  Neuro:  Strength and sensation are intact and no gross focal deficits noted.  Psych: Alert, appropriate and with normal affect.   LABORATORY DATA:  EKG:  EKG is not ordered today.  Lab Results  Component Value Date   WBC 7.8 01/16/2018   HGB 12.6 01/16/2018   HCT 37.0 01/16/2018   PLT 402 (H) 01/16/2018   GLUCOSE 104 (H) 01/16/2018   CHOL 153 01/16/2018   TRIG 169 (H) 01/16/2018   HDL 42 01/16/2018   LDLCALC 77 01/16/2018   ALT 25 01/16/2018   AST 27 01/16/2018   NA 141 01/16/2018   K 3.5 01/16/2018   CL 101 01/16/2018   CREATININE 0.70 01/16/2018   BUN 12 01/16/2018   CO2 31 01/16/2018   INR 0.95 01/16/2018   HGBA1C 6.6 (H) 01/16/2018   MICROALBUR 0.91 09/02/2008     BNP (last 3 results) No results for input(s): BNP in the last 8760 hours.  ProBNP (last 3 results) No results for input(s): PROBNP in the last 8760 hours.   Other Studies Reviewed Today:  Echo Study Conclusions 01/2018  - Left ventricle: The cavity size was mildly dilated. Systolic   function was mildly to moderately reduced. The estimated ejection   fraction was in the range of 40% to 45%. Diffuse  hypokinesis.   Moderate hypokinesis of the inferior myocardium. Features are   consistent with a pseudonormal left ventricular filling pattern,   with concomitant abnormal relaxation and increased filling   pressure (grade 2 diastolic dysfunction). - Mitral valve: There was mild regurgitation. - Left atrium: The atrium was moderately dilated. - Atrial septum: No defect or patent foramen ovale was identified.  Impressions:  - Reduced EF with global mild hypokinesis and moderate hypokinesis   inferiorly. Grade 2 diastolic dysfunction.    Coronary Stent Intervention 02/2016  Left Heart Cath and Coronary Angiography  Conclusion    Coronary artery disease with eccentric 70-80% proximal LAD stenosis. Noncritical 65% mid circumflex, 50% first diagonal, and 70% ramus intermedius.  Abnormal intermediate risk myocardial perfusion study with anteroapical perfusion abnormality car laced with the proximal LAD stenosis.  Successful stenting of the proximal LAD from 75% to 0% with TIMI grade 3 flow using a 16 x 3.5 Promus Premier post dilated to 3.75 mm in diameter.  Normal LV function with EF 60%. Mild elevation and end-diastolic pressure at 19 mmHg.  RECOMMENDATIONS:   Increase the intensity of statin therapy.  Aspirin and Brilinta for at least 6 months.  Risk factor modification.  Anticipate discharge in a.m. if no groin complications.       Assessment/Plan:  1. Recent stroke - remains on DAPT. Steady progress. Defer return to work/driving to neurology. I sent her a refill on her Plavix.   2. New LV dysfunction - she has just started back on ARB and beta blocker per her PCP - I have given her Lasix to use just prn. Would favor medical therapy. Seems pretty asymptomatic to me. Her EF by Myoview back in 2017 was 49% - may actually not be too far off from her baseline but was normal at cath in 2017 and EF of 50% in 2018.    3. CAD with prior PCI of the LAD in 2017 - no active  symptoms.   4. HTN - BP is ok - has restarted ARB and beta blocker.   5. HLD - on statin therapy  6. Tobacco abuse -  she has stopped smoking - she is Advertising account executive.   Current medicines are reviewed with the patient today.  The patient does not have concerns regarding medicines other than what has been noted above.  The following changes have been made:  See above.  Labs/ tests ordered today include:   No orders of the defined types were placed in this encounter.    Disposition:   FU with Dr. Tamala Julian in February as planned.   Patient is agreeable to this plan and will call if any problems develop in the interim.   SignedTruitt Merle, NP  02/18/2018 2:02 PM  [facility] [address] [place], [place]  [address] Phone: [phone] Fax: [phone]

## 2018-02-18 ENCOUNTER — Ambulatory Visit (INDEPENDENT_AMBULATORY_CARE_PROVIDER_SITE_OTHER): Payer: BC Managed Care – PPO | Admitting: Nurse Practitioner

## 2018-02-18 ENCOUNTER — Encounter: Payer: Self-pay | Admitting: Physical Therapy

## 2018-02-18 ENCOUNTER — Ambulatory Visit: Payer: BC Managed Care – PPO | Admitting: Physical Therapy

## 2018-02-18 ENCOUNTER — Ambulatory Visit: Payer: BC Managed Care – PPO | Admitting: Occupational Therapy

## 2018-02-18 ENCOUNTER — Encounter: Payer: Self-pay | Admitting: Nurse Practitioner

## 2018-02-18 ENCOUNTER — Encounter: Payer: Self-pay | Admitting: Occupational Therapy

## 2018-02-18 VITALS — BP 128/78 | HR 82 | Ht 65.0 in | Wt 184.4 lb

## 2018-02-18 DIAGNOSIS — F17200 Nicotine dependence, unspecified, uncomplicated: Secondary | ICD-10-CM

## 2018-02-18 DIAGNOSIS — R0602 Shortness of breath: Secondary | ICD-10-CM | POA: Diagnosis not present

## 2018-02-18 DIAGNOSIS — Z9861 Coronary angioplasty status: Secondary | ICD-10-CM

## 2018-02-18 DIAGNOSIS — R41844 Frontal lobe and executive function deficit: Secondary | ICD-10-CM

## 2018-02-18 DIAGNOSIS — R2689 Other abnormalities of gait and mobility: Secondary | ICD-10-CM

## 2018-02-18 DIAGNOSIS — E785 Hyperlipidemia, unspecified: Secondary | ICD-10-CM

## 2018-02-18 DIAGNOSIS — M6281 Muscle weakness (generalized): Secondary | ICD-10-CM

## 2018-02-18 DIAGNOSIS — R278 Other lack of coordination: Secondary | ICD-10-CM

## 2018-02-18 DIAGNOSIS — I10 Essential (primary) hypertension: Secondary | ICD-10-CM

## 2018-02-18 DIAGNOSIS — I2510 Atherosclerotic heart disease of native coronary artery without angina pectoris: Secondary | ICD-10-CM | POA: Diagnosis not present

## 2018-02-18 DIAGNOSIS — R2681 Unsteadiness on feet: Secondary | ICD-10-CM

## 2018-02-18 MED ORDER — CLOPIDOGREL BISULFATE 75 MG PO TABS: 75.0000 mg | ORAL_TABLET | Freq: Every day | ORAL | 3 refills | Status: DC

## 2018-02-18 MED ORDER — FUROSEMIDE 20 MG PO TABS: 20.0000 mg | ORAL_TABLET | Freq: Every day | ORAL | 3 refills | Status: DC | PRN

## 2018-02-18 MED ORDER — ATENOLOL 50 MG PO TABS: 50.0000 mg | ORAL_TABLET | Freq: Every day | ORAL | 3 refills | Status: DC

## 2018-02-18 MED ORDER — LOSARTAN POTASSIUM 100 MG PO TABS: 100.0000 mg | ORAL_TABLET | Freq: Every day | ORAL | 3 refills | Status: DC

## 2018-02-18 NOTE — Patient Instructions (Signed)
  Coordination Activities  Perform the following activities for 20 minutes 1 times per day with both hand(s).   Rotate ball in fingertips (clockwise and counter-clockwise).  Toss ball between hands.  Flip cards 1 at a time.  Deal cards with your thumb (Hold deck in hand and push card off top with thumb).  Pick up coins, buttons, marbles, dried beans/pasta of different sizes and place in container.  Pick up coins and place in container or coin bank.  Pick up coins and stack.  Pick up coins one at a time until you get 5-10 in your hand, then move coins from palm to fingertips to stack one at a time.  Practice writing and/or typing.

## 2018-02-18 NOTE — Patient Instructions (Addendum)
We will be checking the following labs today - NONE   Medication Instructions:    Continue with your current medicines.   I will send in a RX for Lasix 20 mg to use only if needed   If you need a refill on your cardiac medications before your next appointment, please call your pharmacy.     Testing/Procedures To Be Arranged:  N/A  Follow-Up:   See me or Dr. Tamala Julian in about 6 to 8 weeks.     At [facility], you and your health needs are our priority.  As part of our continuing mission to provide you with exceptional heart care, we have created designated Provider Care Teams.  These Care Teams include your primary Cardiologist (physician) and Advanced Practice Providers (APPs -  Physician Assistants and Nurse Practitioners) who all work together to provide you with the care you need, when you need it.  Special Instructions:  . Congrats on stopping smoking.   Call the [place] office at [phone] if you have any questions, problems or concerns.

## 2018-02-18 NOTE — Therapy (Signed)
[facility] [address] [place], [place], [address] Phone: [phone]   Fax:  [phone]  Physical Therapy Treatment  Patient Details  Name: Leah Norton MRN: [id number] Date of Birth: [birth year] Referring Provider (PT): Seward Carol, MD   Encounter Date: 02/18/2018  PT End of Session - 02/18/18 1210    Visit Number  3    Number of Visits  23    Date for PT Re-Evaluation  05/12/18    Authorization Type  State BCBS    PT Start Time  1021    PT Stop Time  1105    PT Time Calculation (min)  44 min    Activity Tolerance  Patient tolerated treatment well    Behavior During Therapy  [facility] for tasks assessed/performed       Past Medical History:  Diagnosis Date  . Anginal pain ([place])   . Arthritis    "right anikle" (02/29/2016)  . CAD in native artery 02/08/2016   Three-vessel coronary artery calcification on CT scan  . Complication of anesthesia    "had reaction w/hives, rash, itching" (02/29/2016)  . Constipation   . Coronary artery disease    12/17 PCI with DESx1 to pLAD EF normal  . Enlarged heart   . GERD (gastroesophageal reflux disease)   . High cholesterol   . Hypertension   . Migraine    hx  . Symptomatic anemia 07/10/2016  . Type II diabetes mellitus ([place])     Past Surgical History:  Procedure Laterality Date  . ABDOMINAL HYSTERECTOMY    . CARDIAC CATHETERIZATION N/A 02/29/2016   Procedure: Left Heart Cath and Coronary Angiography;  Surgeon: Belva Crome, MD;  Location: [facility];  Service: Cardiovascular;  Laterality: N/A;  . CARDIAC CATHETERIZATION N/A 02/29/2016   Procedure: Coronary Stent Intervention;  Surgeon: Belva Crome, MD;  Location: [facility];  Service: Cardiovascular;  Laterality: N/A;  . CARDIAC CATHETERIZATION  2005   "no blockages"  . COLONOSCOPY WITH PROPOFOL N/A 03/15/2014   Procedure: COLONOSCOPY WITH PROPOFOL;  Surgeon: Garlan Fair, MD;   Location: [facility];  Service: Endoscopy;  Laterality: N/A;  . CORONARY ANGIOPLASTY WITH STENT PLACEMENT    . ESOPHAGOGASTRODUODENOSCOPY N/A 07/11/2016   Procedure: ESOPHAGOGASTRODUODENOSCOPY (EGD);  Surgeon: Laurence Spates, MD;  Location[facility];  Service: Endoscopy;  Laterality: N/A;  . FRACTURE SURGERY    . MULTIPLE TOOTH EXTRACTIONS     teeth removed  . ORIF ANKLE FRACTURE Right 11/12/2012   Procedure: OPEN REDUCTION INTERNAL FIXATION (ORIF) ANKLE FRACTURE;  Surgeon: Marianna Payment, MD;  Location: [facility];  Service: Orthopedics;  Laterality: Right;  Open reduction internal fixation ankle fracture, trimalleolar    There were no vitals filed for this visit.  Subjective Assessment - 02/18/18 1151    Subjective  Pt denies changes other than some leg pain since last visit.  States it was present prior to CVA but seems a little worse.  Asks PT about driving despite OT discussing with her in earlier sessoin.    Pertinent History  PMH includes essential HTN, hyperlipidemia, DM, DDD lumbo-sacral spine; Hx of CVA 01/15/18    Patient Stated Goals  Pt's goals for physical therapy are to do whatever I can to return to normal.    Currently in Pain?  Yes    Pain Score  3     Pain Location  Leg    Pain Orientation  Right    Pain  Descriptors / Indicators  Aching    Pain Type  Other (Comment)   pt intiially states acute but then states she has experienced prior to CVA   Pain Onset  Other (comment)    Pain Frequency  Intermittent    Aggravating Factors   unsure    Pain Relieving Factors  unsure    Multiple Pain Sites  No                       [facility] Adult PT Treatment/Exercise - 02/18/18 0001      Transfers   Transfers  Sit to Stand;Stand to Sit    Sit to Stand  5: Supervision;Without upper extremity assist;From chair/3-in-1    Sit to Stand Details  Verbal cues for technique   verbal cues for equal weight bearing through LE's and contro   Sit to Stand Details (indicate cue  type and reason)  x 15 reps    Stand to Sit  5: Supervision;Without upper extremity assist;To chair/3-in-1      Ambulation/Gait   Ambulation/Gait  Yes    Ambulation/Gait Assistance  5: Supervision    Ambulation Distance (Feet)  --   through out clinic during session to various stations   Assistive device  None    Gait Pattern  Step-through pattern;Decreased step length - left;Decreased trunk rotation;Decreased arm swing - left    Ambulation Surface  Level;Indoor      Exercises   Exercises  Knee/Hip;Other Exercises      Knee/Hip Exercises: Standing   Hip Flexion  AROM;Both;1 set;15 reps;Knee bent   cues for technique and speed   Terminal Knee Extension  AROM;Left;15 reps;Theraband    Theraband Level (Terminal Knee Extension)  Level 2 (Red)    Terminal Knee Extension Limitations  needs cues for control    Hip Abduction  AROM;Both;15 reps;Knee straight    Abduction Limitations  cues for technique and spped    Hip Extension  AROM;Both;15 reps;Knee straight   cues for speed and to maintain extension (vs abduction)   Lateral Step Up  Both;1 set;Hand Hold: 1;Step Height: 6";15 reps    Forward Step Up  Both;15 reps;Hand Hold: 1;Step Height: 6"    Step Down  Both;15 reps;Hand Hold: 1;Step Height: 6"    SLS  standing taps and double taps to cones with min guard assist.  Difficulty with SLS.          Balance Exercises - 02/18/18 1206      Balance Exercises: Standing   Standing Eyes Opened  Narrow base of support (BOS);Wide (BOA);Head turns;Foam/compliant surface;Solid surface;10 secs   no signiifant LOB with eyes open   Standing Eyes Closed  Narrow base of support (BOS);Wide (BOA);Head turns;Foam/compliant surface;3 reps;10 secs;Other (comment)   Pt with "sway" that increased with feet together no LOB       PT Education - 02/18/18 1208    Education Details  Reviewed HEP-reinforced control and decreased speed;Need to follow up with MD before returning to driving.    Person(s)  Educated  Patient    Methods  Explanation;Demonstration;Verbal cues;Handout    Comprehension  Verbalized understanding;Returned demonstration;Verbal cues required;Need further instruction       PT Short Term Goals - 02/14/18 2057      PT SHORT TERM GOAL #1   Title  Pt will be independent with HEP for improved strength, balance, gait.  TARGET 03/14/18    Time  5    Period  Weeks  PT SHORT TERM GOAL #2   Title  Pt will improve DGI score to at least 21/24 for decreased fall risk.    Time  5    Period  Weeks    Status  New      PT SHORT TERM GOAL #3   Title  Pt to improve 6 minute walk test goal by at least 150 ft for improved gait efficiency in community.    Baseline  02/13/18:  866 ft in 6MWT    Time  5    Period  Weeks    Status  Revised      PT SHORT TERM GOAL #4   Title  Pt will verbalize understanding of CVA warning signs and symptoms.    Time  5    Period  Weeks    Status  New        PT Long Term Goals - 02/11/18 1459      PT LONG TERM GOAL #1   Title  Pt will verbalize understanding of fall prevention in the home environment.  TARGET 04/11/18    Time  9    Period  Weeks    Status  New    Target Date  04/11/18      PT LONG TERM GOAL #2   Title  Pt will improve 5x sit<>stand score to less than or equal to 11.4 seconds for improved functional strength and transfer efficiency.    Time  9    Period  Weeks    Status  New    Target Date  04/11/18      PT LONG TERM GOAL #3   Title  Pt will improve gait velocity to at least 2.62 ft/sec for improved gait efficiency and safety.    Time  9    Period  Weeks    Status  New    Target Date  04/11/18      PT LONG TERM GOAL #4   Title  Pt will ambulate at least 1000 ft, indoor and outdoor surfaces, independently no LOB, for improved gait negotiation outdoor surfaces.    Time  9    Period  Weeks    Status  New    Target Date  04/11/18            Plan - 02/18/18 1210    Clinical Impression Statement  Pt needs  cues to reinforce correct technique with all exercises and continued reinforcement while performing (decreased carryover).  Pt continues with L knee recurvatum in standing activities.  Pt with swaying during eyes closed compliant surface but no significant LOB.  Continue PT for strength, balance and gait for improved functional mobility and independence.     Rehab Potential  Good    PT Treatment/Interventions  ADLs/Self Care Home Management;Gait training;Functional mobility training;Therapeutic activities;Therapeutic exercise;Balance training;Patient/family education;Neuromuscular re-education    PT Next Visit Plan  Continue with strength and balance, gait for L step length and L knee control.  SLS activities.    Consulted and Agree with Plan of Care  Patient       Patient will benefit from skilled therapeutic intervention in order to improve the following deficits and impairments:  Abnormal gait, Decreased balance, Decreased mobility, Difficulty walking, Decreased strength  Visit Diagnosis: Other abnormalities of gait and mobility  Muscle weakness (generalized)  Unsteadiness on feet     Problem List Patient Active Problem List   Diagnosis Date Noted  . Acute ischemic stroke ([place]) 01/16/2018  . Acute  CVA (cerebrovascular accident) ([place]) 01/16/2018  . GI bleed 07/10/2016  . Morbid obesity due to excess calories ([place]) 05/14/2016  . Dyspnea on exertion 04/29/2016  . Abnormal nuclear stress test   . CAD S/P percutaneous coronary angioplasty 02/08/2016  . INSOMNIA, CHRONIC 02/28/2010  . HEADACHE 02/28/2010  . HEEL PAIN, RIGHT 11/29/2009  . COUGH DUE TO ACE INHIBITORS 11/29/2009  . ATRIAL ENLARGEMENT, LEFT 03/21/2009  . ANEMIA 12/28/2008  . CARDIAC MURMUR 12/28/2008  . OTHER NONSPECIFIC ABNORMAL SERUM ENZYME LEVELS 12/28/2008  . CERVICAL MUSCLE STRAIN 12/28/2008  . CONSTIPATION 06/01/2008  . POSTMENOPAUSAL STATUS 05/15/2008  . BRONCHITIS, CHRONIC 12/18/2007  . Cigarette smoker  08/19/2007  . DEGENERATIVE DISC DISEASE, LUMBOSACRAL SPINE 08/19/2007  . LOW BACK PAIN SYNDROME 08/19/2007  . UPPER RESPIRATORY INFECTION, VIRAL 03/05/2007  . NEOPLASM, SKIN, UNCERTAIN BEHAVIOR 77/37/3668  . GANGLION CYST, WRIST, LEFT 01/28/2007  . DENTAL PAIN 01/19/2007  . DM (diabetes mellitus), type 2, uncontrolled with complications ([place]) 15/94/7076  . Hyperlipidemia with target LDL less than 70 12/10/2006  . Essential hypertension 12/10/2006  . RHINITIS, ALLERGIC NEC 12/10/2006  . HIP PAIN, RIGHT 12/10/2006    Narda Bonds, PTA Reader 02/18/18 12:16 PM Phone: [phone] Fax: [facility] [address] [place], [place], [address] Phone: [phone]   Fax:  [phone]  Name: HAILIE SEARIGHT MRN: [id number] Date of Birth: Jul 29, 1955

## 2018-02-18 NOTE — Therapy (Signed)
[facility] [address] [place] [place], [place], [address] Phone: [phone]   Fax:  [phone]  Occupational Therapy Treatment  Patient Details  Name: Leah Norton MRN: [id number] Date of Birth: 1955-10-17 Referring Provider (OT): Dr. Delfina Redwood   Encounter Date: 02/18/2018  OT End of Session - 02/18/18 0939    Visit Number  2    Number of Visits  25    Date for OT Re-Evaluation  04/12/18    Authorization Type  State BCBS    OT Start Time  0935    OT Stop Time  1015    OT Time Calculation (min)  40 min    Activity Tolerance  Patient tolerated treatment well    Behavior During Therapy  [facility] for tasks assessed/performed       Past Medical History:  Diagnosis Date  . Anginal pain ([place])   . Arthritis    "right anikle" (02/29/2016)  . CAD in native artery 02/08/2016   Three-vessel coronary artery calcification on CT scan  . Complication of anesthesia    "had reaction w/hives, rash, itching" (02/29/2016)  . Constipation   . Coronary artery disease    12/17 PCI with DESx1 to pLAD EF normal  . Enlarged heart   . GERD (gastroesophageal reflux disease)   . High cholesterol   . Hypertension   . Migraine    hx  . Symptomatic anemia 07/10/2016  . Type II diabetes mellitus ([place])     Past Surgical History:  Procedure Laterality Date  . ABDOMINAL HYSTERECTOMY    . CARDIAC CATHETERIZATION N/A 02/29/2016   Procedure: Left Heart Cath and Coronary Angiography;  Surgeon: Belva Crome, MD;  Location: [facility];  Service: Cardiovascular;  Laterality: N/A;  . CARDIAC CATHETERIZATION N/A 02/29/2016   Procedure: Coronary Stent Intervention;  Surgeon: Belva Crome, MD;  Location: [facility];  Service: Cardiovascular;  Laterality: N/A;  . CARDIAC CATHETERIZATION  2005   "no blockages"  . COLONOSCOPY WITH PROPOFOL N/A 03/15/2014   Procedure: COLONOSCOPY WITH PROPOFOL;  Surgeon: Garlan Fair, MD;  Location:  [facility];  Service: Endoscopy;  Laterality: N/A;  . CORONARY ANGIOPLASTY WITH STENT PLACEMENT    . ESOPHAGOGASTRODUODENOSCOPY N/A 07/11/2016   Procedure: ESOPHAGOGASTRODUODENOSCOPY (EGD);  Surgeon: Laurence Spates, MD;  Location: [facility] ENDOS[facility];  Service: Endoscopy;  Laterality: N/A;  . FRACTURE SURGERY    . MULTIPLE TOOTH EXTRACTIONS     teeth removed  . ORIF ANKLE FRACTURE Right 11/12/2012   Procedure: OPEN REDUCTION INTERNAL FIXATION (ORIF) ANKLE FRACTURE;  Surgeon: Marianna Payment, MD;  Location: [facility];  Service: Orthopedics;  Laterality: Right;  Open reduction internal fixation ankle fracture, trimalleolar    There were no vitals filed for this visit.  Subjective Assessment - 02/18/18 0936    Subjective   Pt reports right leg pain    Patient Stated Goals  return to work    Currently in Pain?  Yes    Pain Score  3     Pain Location  Leg    Pain Orientation  Right    Pain Descriptors / Indicators  Aching    Pain Type  Acute pain    Pain Onset  In the past 7 days    Pain Frequency  Intermittent    Aggravating Factors   standing    Pain Relieving Factors  sitting    Multiple Pain Sites  No  Treatment: Pt was instructed in coordination HEP. Arm bike x 5 mins level 3 for conditioning, reciprocal movement, min v.c           OT Education - 02/18/18 1255    Education Details  coordination HEP see pt  instructions, reviewed goals set at inital eval and printed out for patient,     Person(s) Educated  Patient    Methods  Explanation;Demonstration;Verbal cues    Comprehension  Verbalized understanding;Returned demonstration;Verbal cues required       OT Short Term Goals - 02/11/18 1744      OT SHORT TERM GOAL #1   Title  I with inital HEP.    Time  4    Period  Weeks    Status  New      OT SHORT TERM GOAL #2   Title  Pt will improve LUE grip strength by 10 lbs for increased LUE functional use during ADLS.    Baseline  RUE 32 lbs, LUE 10 lbs     Time  4    Period  Weeks    Status  New      OT SHORT TERM GOAL #3   Title  Pt will demonstrate improved LUE functional use for ADLSas evidenced by decreasing 9 hole peg test score to 30 secs or less.    Baseline  RUE 27.79 secs, LUE 35.28 secs    Time  4    Period  Weeks    Status  New      OT SHORT TERM GOAL #4   Title  Pt will demonstrate ability to perform basic cooking at a modified independent level demonstrating good safety awareness.    Time  4    Period  Weeks    Status  New      OT SHORT TERM GOAL #5   Title  Pt will demonstrate ability to maintain selective attention to a functional task in a busy environment x 30 mins without redirects.    Time  4    Period  Weeks    Status  New        OT Long Term Goals - 02/11/18 1752      OT LONG TERM GOAL #1   Title  I with updated HEP.    Time  8    Period  Weeks    Status  New      OT LONG TERM GOAL #2   Title  Pt will demonstrate ability to retrieve a 3 lbs item from overhead shelf with LUE demonstrating good control.    Time  8    Period  Weeks    Status  New      OT LONG TERM GOAL #3   Title  Pt will demonstrate ability to perform a physical and cognitive task simultaneously with 90% or better accuracy in prep for driving and work activities.    Time  8    Period  Weeks    Status  New      OT LONG TERM GOAL #4   Title  Pt will perfrom simulated work activities at a modified independent level.    Time  8    Period  Weeks    Status  New      OT LONG TERM GOAL #5   Title  Pt will perform mod complex home management at a modified independent level.    Time  8    Period  Weeks    Status  New      Long Term Additional Goals   Additional Long Term Goals  Yes      OT LONG TERM GOAL #6   Title  Pt will demonstrate ability to perfrom mod complex cooking (ie: 2 items at 1 time )modified independently demonstrating good safety awareness.    Time  8    Period  Weeks    Status  New            Plan -  02/18/18 1244    Clinical Impression Statement  Pt is progressing towards goals. She demonstrates understanding of coordination HEP.    Occupational Profile and client history currently impacting functional performance  PMH: DM II, HTN, CAD s/p percutaneous angioplasty, hx of GI bleed, tobacco use, Pt was completely I and working full time as [employer], Pt is currently living with family and requires assistance for transportation.    Occupational performance deficits (Please refer to evaluation for details):  ADL's;IADL's;Work;Play;Leisure;Social Participation    Rehab Potential  Good    Current Impairments/barriers affecting progress:  cognitive deficits, impulsivity, decreased awareness, decreased coordination    OT Frequency  3x / week    OT Duration  8 weeks    OT Treatment/Interventions  Self-care/ADL training;Therapeutic exercise;Gait Training;Aquatic Therapy;Moist Heat;Paraffin;Neuromuscular education;Splinting;Patient/family education;Balance training;Therapeutic activities;Functional Mobility Training;Energy conservation;Fluidtherapy;Ultrasound;Cryotherapy;Contrast Bath;DME and/or AE instruction;Manual Therapy;Passive range of motion;Cognitive remediation/compensation    Plan  simple cooking task, look at safety, review putty HEP    Consulted and Agree with Plan of Care  Patient       Patient will benefit from skilled therapeutic intervention in order to improve the following deficits and impairments:  Abnormal gait, Decreased mobility, Decreased coordination, Decreased range of motion, Decreased strength, Impaired UE functional use, Impaired perceived functional ability, Difficulty walking, Decreased safety awareness, Decreased knowledge of precautions, Decreased balance  Visit Diagnosis: Muscle weakness (generalized)  Other lack of coordination  Frontal lobe and executive function deficit    Problem List Patient Active Problem List   Diagnosis Date Noted  .  Acute ischemic stroke ([place]) 01/16/2018  . Acute CVA (cerebrovascular accident) ([place]) 01/16/2018  . GI bleed 07/10/2016  . Morbid obesity due to excess calories ([place]) 05/14/2016  . Dyspnea on exertion 04/29/2016  . Abnormal nuclear stress test   . CAD S/P percutaneous coronary angioplasty 02/08/2016  . INSOMNIA, CHRONIC 02/28/2010  . HEADACHE 02/28/2010  . HEEL PAIN, RIGHT 11/29/2009  . COUGH DUE TO ACE INHIBITORS 11/29/2009  . ATRIAL ENLARGEMENT, LEFT 03/21/2009  . ANEMIA 12/28/2008  . CARDIAC MURMUR 12/28/2008  . OTHER NONSPECIFIC ABNORMAL SERUM ENZYME LEVELS 12/28/2008  . CERVICAL MUSCLE STRAIN 12/28/2008  . CONSTIPATION 06/01/2008  . POSTMENOPAUSAL STATUS 05/15/2008  . BRONCHITIS, CHRONIC 12/18/2007  . Cigarette smoker 08/19/2007  . DEGENERATIVE DISC DISEASE, LUMBOSACRAL SPINE 08/19/2007  . LOW BACK PAIN SYNDROME 08/19/2007  . UPPER RESPIRATORY INFECTION, VIRAL 03/05/2007  . NEOPLASM, SKIN, UNCERTAIN BEHAVIOR 53/66/4403  . GANGLION CYST, WRIST, LEFT 01/28/2007  . DENTAL PAIN 01/19/2007  . DM (diabetes mellitus), type 2, uncontrolled with complications ([place]) 47/42/5956  . Hyperlipidemia with target LDL less than 70 12/10/2006  . Essential hypertension 12/10/2006  . RHINITIS, ALLERGIC NEC 12/10/2006  . HIP PAIN, RIGHT 12/10/2006    RINE,KATHRYN 02/18/2018, 12:57 PM  [facility] [address] [place], [place], [address] Phone: [phone]   Fax:  [phone]  Name: TANAYA DUNIGAN MRN: [id number] Date of Birth: [birth year]

## 2018-02-19 ENCOUNTER — Ambulatory Visit: Payer: BC Managed Care – PPO | Admitting: Physical Therapy

## 2018-02-19 ENCOUNTER — Encounter: Payer: Self-pay | Admitting: Physical Therapy

## 2018-02-19 ENCOUNTER — Ambulatory Visit: Payer: BC Managed Care – PPO

## 2018-02-19 DIAGNOSIS — I69923 Fluency disorder following unspecified cerebrovascular disease: Secondary | ICD-10-CM

## 2018-02-19 DIAGNOSIS — R471 Dysarthria and anarthria: Secondary | ICD-10-CM

## 2018-02-19 DIAGNOSIS — M6281 Muscle weakness (generalized): Secondary | ICD-10-CM

## 2018-02-19 DIAGNOSIS — R41841 Cognitive communication deficit: Secondary | ICD-10-CM

## 2018-02-19 DIAGNOSIS — R2681 Unsteadiness on feet: Secondary | ICD-10-CM

## 2018-02-19 DIAGNOSIS — R278 Other lack of coordination: Secondary | ICD-10-CM | POA: Diagnosis not present

## 2018-02-19 DIAGNOSIS — R2689 Other abnormalities of gait and mobility: Secondary | ICD-10-CM

## 2018-02-19 NOTE — Therapy (Signed)
[facility] [address] [place], [place], [address] Phone: [phone]   Fax:  [phone]  Speech Language Pathology Treatment  Patient Details  Name: Leah Norton MRN: [id number] Date of Birth: 1955/11/02 Referring Provider (SLP): Seward Carol, MD   Encounter Date: 02/19/2018  End of Session - 02/19/18 0853    Visit Number  3    Number of Visits  21    Date for SLP Re-Evaluation  04/12/18    Authorization Type  BCBS    SLP Start Time  0817   16 minutes late   SLP Stop Time   0847    SLP Time Calculation (min)  30 min    Activity Tolerance  Patient tolerated treatment well       Past Medical History:  Diagnosis Date  . Anginal pain ([place])   . Arthritis    "right anikle" (02/29/2016)  . CAD in native artery 02/08/2016   Three-vessel coronary artery calcification on CT scan  . Complication of anesthesia    "had reaction w/hives, rash, itching" (02/29/2016)  . Constipation   . Coronary artery disease    12/17 PCI with DESx1 to pLAD EF normal  . Enlarged heart   . GERD (gastroesophageal reflux disease)   . High cholesterol   . Hypertension   . Migraine    hx  . Symptomatic anemia 07/10/2016  . Type II diabetes mellitus ([place])     Past Surgical History:  Procedure Laterality Date  . ABDOMINAL HYSTERECTOMY    . CARDIAC CATHETERIZATION N/A 02/29/2016   Procedure: Left Heart Cath and Coronary Angiography;  Surgeon: Belva Crome, MD;  Location: [facility];  Service: Cardiovascular;  Laterality: N/A;  . CARDIAC CATHETERIZATION N/A 02/29/2016   Procedure: Coronary Stent Intervention;  Surgeon: Belva Crome, MD;  Location: [facility];  Service: Cardiovascular;  Laterality: N/A;  . CARDIAC CATHETERIZATION  2005   "no blockages"  . COLONOSCOPY WITH PROPOFOL N/A 03/15/2014   Procedure: COLONOSCOPY WITH PROPOFOL;  Surgeon: Garlan Fair, MD;  Location: [facility];  Service: Endoscopy;   Laterality: N/A;  . CORONARY ANGIOPLASTY WITH STENT PLACEMENT    . ESOPHAGOGASTRODUODENOSCOPY N/A 07/11/2016   Procedure: ESOPHAGOGASTRODUODENOSCOPY (EGD);  Surgeon: Laurence Spates, MD;  [facility];  Service: Endoscopy;  Laterality: N/A;  . FRACTURE SURGERY    . MULTIPLE TOOTH EXTRACTIONS     teeth removed  . ORIF ANKLE FRACTURE Right 11/12/2012   Procedure: OPEN REDUCTION INTERNAL FIXATION (ORIF) ANKLE FRACTURE;  Surgeon: Marianna Payment, MD;  Location: [facility];  Service: Orthopedics;  Laterality: Right;  Open reduction internal fixation ankle fracture, trimalleolar    There were no vitals filed for this visit.  Subjective Assessment - 02/19/18 0819    Subjective  "I guess this stroke affected my memory." Pt 16 minutes late.    Currently in Pain?  Yes    Pain Score  2     Pain Location  Ankle    Pain Orientation  Right    Pain Onset  More than a month ago    Pain Frequency  Intermittent            ADULT SLP TREATMENT - 02/19/18 0822      General Information   Behavior/Cognition  Alert;Cooperative;Impulsive      Treatment Provided   Treatment provided  Cognitive-Linquistic      Cognitive-Linquistic Treatment   Treatment focused on  Cognition;Dysarthria    Skilled  Treatment  (cognition 24 minutes) Pt endorsed her memory would hinder her return to work, req'd mod cues from SLP to ID more than one. SLP talked with pt about a 3-ring binder that she can organize her handouts, exercises, and papers as they were all together in a folder and it took pt 3 1/2 minutes to locate a word/phrase list from SLP with SLP cues for attention to task (selective attention - pt distracted by other handouts in the folder). SLP asked pt what she could do to make it easier for her to locate papers she needed and pt req'd extra time to problem solve for a 3-ring binder with dividers. SLP told pt this would be her homework however reduced anticipatory awareness was shown by pt not spontaneously  writing anything down as a reminder without questioning cues from SLP. (speech tx) Pt req'd usual cues for slow rate and overarticulation with her HEP of salient/functional phrases, and words.       Assessment / Recommendations / Plan   Plan  Continue with current plan of care      Progression Toward Goals   Progression toward goals  Progressing toward goals       SLP Education - 02/19/18 0852    Education Details  3-ring binder to organize therapy papers    Person(s) Educated  Patient    Methods  Explanation    Comprehension  Verbalized understanding       SLP Short Term Goals - 02/19/18 1314      SLP SHORT TERM GOAL #1   Title  Pt will respond with personally relevant sentences 18/20 with [facility] speech fluidity over three sessions with use of compensations (slow rate)    Time  3    Period  Weeks    Status  On-going      SLP SHORT TERM GOAL #2   Title  Pt will use a memory compensation system to record, reference and organize daily activities/appointments with occasional min A x 2 sessions     Time  3    Period  Weeks    Status  On-going      SLP SHORT TERM GOAL #3   Title  Pt will tell SLP 2 cognitive deficits that would impact her at work.    Time  3    Period  Weeks    Status  On-going      SLP SHORT TERM GOAL #4   Title  Pt will demo attention to detail by recalling pertinent information from 3-5 sentence auditory messages 85% accuracy x 3 sessions (verbal/ written summary or responding to questions)    Time  3    Period  Weeks    Status  On-going       SLP Long Term Goals - 02/19/18 1315      SLP LONG TERM GOAL #1   Title  Using compensatory strategies for fluency/dysarthria, pt will functionally ask investigative questions or give a verbal report of functional scenarios over 4 sessions    Time  7    Period  Weeks    Status  On-going      SLP LONG TERM GOAL #2   Title  Pt will demo WFL fluidity of speech in 10 minutes mod to complex/complex conversation with  modified independence outside of ST room x 3 sessions    Time  7    Period  Weeks    Status  On-going      SLP LONG TERM  GOAL #3   Title  pt will demo functional executive function (e.g., planning, organization) ability in personally relevant tasks (written and/or auditorily provided) 90% of the time     Time  7    Period  Weeks    Status  On-going      SLP LONG TERM GOAL #4   Title  Pt will demo 15 minutes alternating attention between two mod complex cognitive tasks to achieve >85% success on each task over two sessions    Time  7    Period  Weeks    Status  On-going      SLP LONG TERM GOAL #5   Title  pt will demo WNL emergent awareness with mod complex tasks in order to achieve >95% success over three sessions     Time  7    Period  Weeks    Status  On-going       Plan - 02/19/18 1313    Clinical Impression Statement  Patient presents with moderate cognitive communication deficits, and mild dysarthria. Pt had rare dysfluencies today in conversation (never more than one rep on initial phoneme).  Impairements in alternating attn, attn to detail, executive function, recall, and awareness noted today, as well as impulsivity. 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 Deficits in attention, recall, and organization would hinder her ability to complete job duties at this time, including completing investigations, dictation, and writing reports. I recommend skilled ST to maximize cognitive and communicative function for possible return to work and improved independence and QOL    Speech Therapy Frequency  3x / week   3x per week for 4 weeks, then 2x per week for 4 weeks   Duration  --   8 weeks or 21 visits total   Treatment/Interventions  Cognitive reorganization;Multimodal communcation approach;Compensatory strategies;Compensatory techniques;Cueing hierarchy;Internal/external aids;Functional tasks;SLP  instruction and feedback;Patient/family education    Potential to Achieve Goals  Good       Patient will benefit from skilled therapeutic intervention in order to improve the following deficits and impairments:   Cognitive communication deficit  Dysarthria and anarthria  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 Acute ischemic stroke ([place]) 01/16/2018  . Acute CVA (cerebrovascular accident) ([place]) 01/16/2018  . GI bleed 07/10/2016  . Morbid obesity due to excess calories ([place]) 05/14/2016  . Dyspnea on exertion 04/29/2016  . Abnormal nuclear stress test   . CAD S/P percutaneous coronary angioplasty 02/08/2016  . INSOMNIA, CHRONIC 02/28/2010  . HEADACHE 02/28/2010  . HEEL PAIN, RIGHT 11/29/2009  . COUGH DUE TO ACE INHIBITORS 11/29/2009  . ATRIAL ENLARGEMENT, LEFT 03/21/2009  . ANEMIA 12/28/2008  . CARDIAC MURMUR 12/28/2008  . OTHER NONSPECIFIC ABNORMAL SERUM ENZYME LEVELS 12/28/2008  . CERVICAL MUSCLE STRAIN 12/28/2008  . CONSTIPATION 06/01/2008  . POSTMENOPAUSAL STATUS 05/15/2008  . BRONCHITIS, CHRONIC 12/18/2007  . Cigarette smoker 08/19/2007  . DEGENERATIVE DISC DISEASE, LUMBOSACRAL SPINE 08/19/2007  . LOW BACK PAIN SYNDROME 08/19/2007  . UPPER RESPIRATORY INFECTION, VIRAL 03/05/2007  . NEOPLASM, SKIN, UNCERTAIN BEHAVIOR 03/20/3233  . GANGLION CYST, WRIST, LEFT 01/28/2007  . DENTAL PAIN 01/19/2007  . DM (diabetes mellitus), type 2, uncontrolled with complications ([place]) 57/32/2025  . Hyperlipidemia with target LDL less than 70 12/10/2006  . Essential hypertension 12/10/2006  . RHINITIS, ALLERGIC NEC 12/10/2006  . HIP  PAIN, RIGHT 12/10/2006    [facility] ,[place], CCC-SLP  02/19/2018, 1:17 PM  [facility] [address] [place], [place], [address] Phone: [phone]   Fax:  [phone]   Name: RIANNE DEGRAAF MRN: [id number] Date of Birth: April 16, 1955

## 2018-02-19 NOTE — Patient Instructions (Signed)
Get a 3-ring binder and organize your papers in the green folder.

## 2018-02-19 NOTE — Therapy (Signed)
[facility] [address] [place], [place], [address] Phone: [phone]   Fax:  [phone]  Physical Therapy Treatment  Patient Details  Name: Leah Norton MRN: [id number] Date of Birth: Jul 26, 1955 Referring Provider (PT): Seward Carol, MD   Encounter Date: 02/19/2018  PT End of Session - 02/19/18 0933    Visit Number  4    Number of Visits  23    Date for PT Re-Evaluation  05/12/18    Authorization Type  State BCBS    PT Start Time  [phone]    PT Stop Time  0932    PT Time Calculation (min)  38 min    Activity Tolerance  Patient tolerated treatment well       Past Medical History:  Diagnosis Date  . Anginal pain ([place])   . Arthritis    "right anikle" (02/29/2016)  . CAD in native artery 02/08/2016   Three-vessel coronary artery calcification on CT scan  . Complication of anesthesia    "had reaction w/hives, rash, itching" (02/29/2016)  . Constipation   . Coronary artery disease    12/17 PCI with DESx1 to pLAD EF normal  . Enlarged heart   . GERD (gastroesophageal reflux disease)   . High cholesterol   . Hypertension   . Migraine    hx  . Symptomatic anemia 07/10/2016  . Type II diabetes mellitus ([place])     Past Surgical History:  Procedure Laterality Date  . ABDOMINAL HYSTERECTOMY    . CARDIAC CATHETERIZATION N/A 02/29/2016   Procedure: Left Heart Cath and Coronary Angiography;  Surgeon: Belva Crome, MD;  Location: [facility];  Service: Cardiovascular;  Laterality: N/A;  . CARDIAC CATHETERIZATION N/A 02/29/2016   Procedure: Coronary Stent Intervention;  Surgeon: Belva Crome, MD;  Location: [facility];  Service: Cardiovascular;  Laterality: N/A;  . CARDIAC CATHETERIZATION  2005   "no blockages"  . COLONOSCOPY WITH PROPOFOL N/A 03/15/2014   Procedure: COLONOSCOPY WITH PROPOFOL;  Surgeon: Garlan Fair, MD;  [facility]: [facility];  Service: Endoscopy;  Laterality: N/A;  .  CORONARY ANGIOPLASTY WITH STENT PLACEMENT    . ESOPHAGOGASTRODUODENOSCOPY N/A 07/11/2016   Procedure: ESOPHAGOGASTRODUODENOSCOPY (EGD);  Surgeon: Laurence Spates, MD;  Location: [facility];  Service: Endoscopy;  Laterality: N/A;  . FRACTURE SURGERY    . MULTIPLE TOOTH EXTRACTIONS     teeth removed  . ORIF ANKLE FRACTURE Right 11/12/2012   Procedure: OPEN REDUCTION INTERNAL FIXATION (ORIF) ANKLE FRACTURE;  Surgeon: Marianna Payment, MD;  Location: [facility];  Service: Orthopedics;  Laterality: Right;  Open reduction internal fixation ankle fracture, trimalleolar    There were no vitals filed for this visit.  Subjective Assessment - 02/19/18 0855    Subjective  No falls; has been working on HEP since last visit.    Pertinent History  PMH includes essential HTN, hyperlipidemia, DM, DDD lumbo-sacral spine; Hx of CVA 01/15/18    Patient Stated Goals  Pt's goals for physical therapy are to do whatever I can to return to normal.    Currently in Pain?  No/denies    Pain Onset  More than a month ago                       [facility] Adult PT Treatment/Exercise - 02/19/18 0001      Ambulation/Gait   Ambulation/Gait  --    Stairs  Yes    Stairs Assistance  5:  Supervision    Stairs Assistance Details (indicate cue type and reason)  working on balance and coordination, progressing to alternating pattern.    Stair Management Technique  No rails;Alternating pattern    Number of Stairs  4   x5         Balance Exercises - 02/19/18 0856      Balance Exercises: Standing   Standing, One Foot on a Step  Eyes open;4 inch   multidirectional tapping cones, then alt tapping step with upper trunk rotations   Stepping Strategy  Anterior;Posterior;Lateral;Foam/compliant surface   With cues for weight shifting; progressed from noncompliant to compliant surface, supervision   Step Ups  Forward;6 inch;Intermittent UE support   with single LE only; x10 ea + step downs, single LE  x10 ea.          PT Short Term Goals - 02/14/18 2057      PT SHORT TERM GOAL #1   Title  Pt will be independent with HEP for improved strength, balance, gait.  TARGET 03/14/18    Time  5    Period  Weeks      PT SHORT TERM GOAL #2   Title  Pt will improve DGI score to at least 21/24 for decreased fall risk.    Time  5    Period  Weeks    Status  New      PT SHORT TERM GOAL #3   Title  Pt to improve 6 minute walk test goal by at least 150 ft for improved gait efficiency in community.    Baseline  02/13/18:  866 ft in 6MWT    Time  5    Period  Weeks    Status  Revised      PT SHORT TERM GOAL #4   Title  Pt will verbalize understanding of CVA warning signs and symptoms.    Time  5    Period  Weeks    Status  New        PT Long Term Goals - 02/11/18 1459      PT LONG TERM GOAL #1   Title  Pt will verbalize understanding of fall prevention in the home environment.  TARGET 04/11/18    Time  9    Period  Weeks    Status  New    Target Date  04/11/18      PT LONG TERM GOAL #2   Title  Pt will improve 5x sit<>stand score to less than or equal to 11.4 seconds for improved functional strength and transfer efficiency.    Time  9    Period  Weeks    Status  New    Target Date  04/11/18      PT LONG TERM GOAL #3   Title  Pt will improve gait velocity to at least 2.62 ft/sec for improved gait efficiency and safety.    Time  9    Period  Weeks    Status  New    Target Date  04/11/18      PT LONG TERM GOAL #4   Title  Pt will ambulate at least 1000 ft, indoor and outdoor surfaces, independently no LOB, for improved gait negotiation outdoor surfaces.    Time  9    Period  Weeks    Status  New    Target Date  04/11/18            Plan - 02/19/18 0926    Clinical  Impression Statement  Skilled session focused on balance training with stepping strategies and SLS stance activities on noncompliant and compliant surfaces, LE strengthening, and balance/coordination working towards  alternating pattern for stair negotiation without rails.  Pt able to perform dynamic stepping on compliant surface and SLS stance activities without UE support.  Pt progressed from step to to alternating pattern on stairs.                                                           Rehab Potential  Good    PT Treatment/Interventions  ADLs/Self Care Home Management;Gait training;Functional mobility training;Therapeutic activities;Therapeutic exercise;Balance training;Patient/family education;Neuromuscular re-education    PT Next Visit Plan  Continue with strength and balance, gait for L step length and L knee control.  SLS activities.    Consulted and Agree with Plan of Care  Patient       Patient will benefit from skilled therapeutic intervention in order to improve the following deficits and impairments:  Abnormal gait, Decreased balance, Decreased mobility, Difficulty walking, Decreased strength  Visit Diagnosis: Muscle weakness (generalized)  Other abnormalities of gait and mobility  Unsteadiness on feet     Problem List Patient Active Problem List   Diagnosis Date Noted  . Acute ischemic stroke ([place]) 01/16/2018  . Acute CVA (cerebrovascular accident) ([place]) 01/16/2018  . GI bleed 07/10/2016  . Morbid obesity due to excess calories ([place]) 05/14/2016  . Dyspnea on exertion 04/29/2016  . Abnormal nuclear stress test   . CAD S/P percutaneous coronary angioplasty 02/08/2016  . INSOMNIA, CHRONIC 02/28/2010  . HEADACHE 02/28/2010  . HEEL PAIN, RIGHT 11/29/2009  . COUGH DUE TO ACE INHIBITORS 11/29/2009  . ATRIAL ENLARGEMENT, LEFT 03/21/2009  . ANEMIA 12/28/2008  . CARDIAC MURMUR 12/28/2008  . OTHER NONSPECIFIC ABNORMAL SERUM ENZYME LEVELS 12/28/2008  . CERVICAL MUSCLE STRAIN 12/28/2008  . CONSTIPATION 06/01/2008  . POSTMENOPAUSAL STATUS 05/15/2008  . BRONCHITIS, CHRONIC 12/18/2007  . Cigarette smoker 08/19/2007  . DEGENERATIVE DISC DISEASE, LUMBOSACRAL SPINE 08/19/2007  . LOW BACK  PAIN SYNDROME 08/19/2007  . UPPER RESPIRATORY INFECTION, VIRAL 03/05/2007  . NEOPLASM, SKIN, UNCERTAIN BEHAVIOR 35/70/1779  . GANGLION CYST, WRIST, LEFT 01/28/2007  . DENTAL PAIN 01/19/2007  . DM (diabetes mellitus), type 2, uncontrolled with complications ([place]) 39/05/90  . Hyperlipidemia with target LDL less than 70 12/10/2006  . Essential hypertension 12/10/2006  . RHINITIS, ALLERGIC NEC 12/10/2006  . HIP PAIN, RIGHT 12/10/2006    Bjorn Loser, PTA  02/19/18, 1:31 PM [facility] [address] [place], [place], [address] Phone: [phone]   Fax:  [phone]  Name: Leah Norton MRN: [id number] Date of Birth: 1955/09/13

## 2018-02-20 ENCOUNTER — Ambulatory Visit: Payer: BC Managed Care – PPO | Admitting: Occupational Therapy

## 2018-02-20 ENCOUNTER — Encounter: Payer: Self-pay | Admitting: Occupational Therapy

## 2018-02-20 ENCOUNTER — Ambulatory Visit: Payer: BC Managed Care – PPO

## 2018-02-20 DIAGNOSIS — R41841 Cognitive communication deficit: Secondary | ICD-10-CM

## 2018-02-20 DIAGNOSIS — M6281 Muscle weakness (generalized): Secondary | ICD-10-CM

## 2018-02-20 DIAGNOSIS — R41844 Frontal lobe and executive function deficit: Secondary | ICD-10-CM

## 2018-02-20 DIAGNOSIS — R471 Dysarthria and anarthria: Secondary | ICD-10-CM

## 2018-02-20 DIAGNOSIS — R278 Other lack of coordination: Secondary | ICD-10-CM | POA: Diagnosis not present

## 2018-02-20 NOTE — Patient Instructions (Signed)
Bring 3-ring binder tomorrow!

## 2018-02-20 NOTE — Therapy (Signed)
[facility] [address] [place], [place], [address] Phone: [phone]   Fax:  [phone]  Occupational Therapy Treatment  Patient Details  Name: Leah Norton MRN: [id number] Date of Birth: 1955-05-24 Referring Provider (OT): Dr. Delfina Redwood   Encounter Date: 02/20/2018  OT End of Session - 02/20/18 1021    Visit Number  3    Number of Visits  25    Date for OT Re-Evaluation  04/12/18    Authorization Type  State BCBS    OT Start Time  0932    OT Stop Time  1015    OT Time Calculation (min)  43 min    Activity Tolerance  Patient tolerated treatment well    Behavior During Therapy  [facility] for tasks assessed/performed       Past Medical History:  Diagnosis Date  . Anginal pain ([place])   . Arthritis    "right anikle" (02/29/2016)  . CAD in native artery 02/08/2016   Three-vessel coronary artery calcification on CT scan  . Complication of anesthesia    "had reaction w/hives, rash, itching" (02/29/2016)  . Constipation   . Coronary artery disease    12/17 PCI with DESx1 to pLAD EF normal  . Enlarged heart   . GERD (gastroesophageal reflux disease)   . High cholesterol   . Hypertension   . Migraine    hx  . Symptomatic anemia 07/10/2016  . Type II diabetes mellitus ([place])     Past Surgical History:  Procedure Laterality Date  . ABDOMINAL HYSTERECTOMY    . CARDIAC CATHETERIZATION N/A 02/29/2016   Procedure: Left Heart Cath and Coronary Angiography;  Surgeon: Belva Crome, MD;  Location: [facility];  Service: Cardiovascular;  Laterality: N/A;  . CARDIAC CATHETERIZATION N/A 02/29/2016   Procedure: Coronary Stent Intervention;  Surgeon: Belva Crome, MD;  Location: [facility];  Service: Cardiovascular;  Laterality: N/A;  . CARDIAC CATHETERIZATION  2005   "no blockages"  . COLONOSCOPY WITH PROPOFOL N/A 03/15/2014   Procedure: COLONOSCOPY WITH PROPOFOL;  Surgeon: Garlan Fair, MD;  Location:  [facility];  Service: Endoscopy;  Laterality: N/A;  . CORONARY ANGIOPLASTY WITH STENT PLACEMENT    . ESOPHAGOGASTRODUODENOSCOPY N/A 07/11/2016   Procedure: ESOPHAGOGASTRODUODENOSCOPY (EGD);  Surgeon: Laurence Spates, MD;  [facility];  Service: Endoscopy;  Laterality: N/A;  . FRACTURE SURGERY    . MULTIPLE TOOTH EXTRACTIONS     teeth removed  . ORIF ANKLE FRACTURE Right 11/12/2012   Procedure: OPEN REDUCTION INTERNAL FIXATION (ORIF) ANKLE FRACTURE;  Surgeon: Marianna Payment, MD;  Location: [facility];  Service: Orthopedics;  Laterality: Right;  Open reduction internal fixation ankle fracture, trimalleolar    There were no vitals filed for this visit.  Subjective Assessment - 02/20/18 0940    Subjective   Patient reports she feels her left hand is getting better.      Patient Stated Goals  return to work    Currently in Pain?  No/denies    Pain Score  0-No pain                   OT Treatments/Exercises (OP) - 02/20/18 0001      ADLs   Cooking  Completed an ADL task - simple cookng task.  Patient able to plan and prepare a grilled cheese sandwhich to assess patient's safety, organization, and coordiantion - UE functional use.   Patient did well, needing only intermittent cueing in  new environment.  Patient with mild inattention to left hand - holding butter knife in left hand while cooking and flipping sandwhich with right.    Patient pleased with her progress thus far.  Patient expressing  some visual field changes in left eye.                OT Education - 02/20/18 1021    Education Details  possible vision changes from aging versus from neurological event    Person(s) Educated  Patient    Methods  Explanation    Comprehension  Verbalized understanding       OT Short Term Goals - 02/20/18 1023      OT SHORT TERM GOAL #1   Title  I with inital HEP.    Period  Weeks    Status  On-going      OT SHORT TERM GOAL #2   Title  Pt will improve LUE grip  strength by 10 lbs for increased LUE functional use during ADLS.    Baseline  RUE 32 lbs, LUE 10 lbs    Time  4    Period  Weeks    Status  On-going      OT SHORT TERM GOAL #3   Title  Pt will demonstrate improved LUE functional use for ADLSas evidenced by decreasing 9 hole peg test score to 30 secs or less.    Baseline  RUE 27.79 secs, LUE 35.28 secs    Time  4    Period  Weeks    Status  On-going      OT SHORT TERM GOAL #4   Title  Pt will demonstrate ability to perform basic cooking at a modified independent level demonstrating good safety awareness.    Time  4    Period  Weeks    Status  On-going      OT SHORT TERM GOAL #5   Title  Pt will demonstrate ability to maintain selective attention to a functional task in a busy environment x 30 mins without redirects.    Time  4    Period  Weeks        OT Long Term Goals - 02/11/18 1752      OT LONG TERM GOAL #1   Title  I with updated HEP.    Time  8    Period  Weeks    Status  New      OT LONG TERM GOAL #2   Title  Pt will demonstrate ability to retrieve a 3 lbs item from overhead shelf with LUE demonstrating good control.    Time  8    Period  Weeks    Status  New      OT LONG TERM GOAL #3   Title  Pt will demonstrate ability to perform a physical and cognitive task simultaneously with 90% or better accuracy in prep for driving and work activities.    Time  8    Period  Weeks    Status  New      OT LONG TERM GOAL #4   Title  Pt will perfrom simulated work activities at a modified independent level.    Time  8    Period  Weeks    Status  New      OT LONG TERM GOAL #5   Title  Pt will perform mod complex home management at a modified independent level.    Time  8    Period  Weeks    Status  New      Long Term Additional Goals   Additional Long Term Goals  Yes      OT LONG TERM GOAL #6   Title  Pt will demonstrate ability to perfrom mod complex cooking (ie: 2 items at 1 time )modified independently  demonstrating good safety awareness.    Time  8    Period  Weeks    Status  New            Plan - 02/20/18 1022    Clinical Impression Statement  Patient feels she is progressing in use of her left hand.      Occupational Profile and client history currently impacting functional performance  PMH: DM II, HTN, CAD s/p percutaneous angioplasty, hx of GI bleed, tobacco use, Pt was completely I and working full time as [employer], Pt is currently living with family and requires assistance for transportation.    Occupational performance deficits (Please refer to evaluation for details):  ADL's;IADL's;Work;Play;Leisure;Social Participation    Rehab Potential  Good    Current Impairments/barriers affecting progress:  cognitive deficits, impulsivity, decreased awareness, decreased coordination    OT Frequency  3x / week    OT Duration  8 weeks    OT Treatment/Interventions  Self-care/ADL training;Therapeutic exercise;Gait Training;Aquatic Therapy;Moist Heat;Paraffin;Neuromuscular education;Splinting;Patient/family education;Balance training;Therapeutic activities;Functional Mobility Training;Energy conservation;Fluidtherapy;Ultrasound;Cryotherapy;Contrast Bath;DME and/or AE instruction;Manual Therapy;Passive range of motion;Cognitive remediation/compensation    Plan  review putty HEP, coord / NMR LUE    Clinical Decision Making  Limited treatment options, no task modification necessary    OT Home Exercise Plan  issued yellow putty HEP    Consulted and Agree with Plan of Care  Patient       Patient will benefit from skilled therapeutic intervention in order to improve the following deficits and impairments:  Abnormal gait, Decreased mobility, Decreased coordination, Decreased range of motion, Decreased strength, Impaired UE functional use, Impaired perceived functional ability, Difficulty walking, Decreased safety awareness, Decreased knowledge of precautions, Decreased  balance  Visit Diagnosis: Frontal lobe and executive function deficit  Other lack of coordination  Muscle weakness (generalized)    Problem List Patient Active Problem List   Diagnosis Date Noted  . Acute ischemic stroke ([place]) 01/16/2018  . Acute CVA (cerebrovascular accident) ([place]) 01/16/2018  . GI bleed 07/10/2016  . Morbid obesity due to excess calories ([place]) 05/14/2016  . Dyspnea on exertion 04/29/2016  . Abnormal nuclear stress test   . CAD S/P percutaneous coronary angioplasty 02/08/2016  . INSOMNIA, CHRONIC 02/28/2010  . HEADACHE 02/28/2010  . HEEL PAIN, RIGHT 11/29/2009  . COUGH DUE TO ACE INHIBITORS 11/29/2009  . ATRIAL ENLARGEMENT, LEFT 03/21/2009  . ANEMIA 12/28/2008  . CARDIAC MURMUR 12/28/2008  . OTHER NONSPECIFIC ABNORMAL SERUM ENZYME LEVELS 12/28/2008  . CERVICAL MUSCLE STRAIN 12/28/2008  . CONSTIPATION 06/01/2008  . POSTMENOPAUSAL STATUS 05/15/2008  . BRONCHITIS, CHRONIC 12/18/2007  . Cigarette smoker 08/19/2007  . DEGENERATIVE DISC DISEASE, LUMBOSACRAL SPINE 08/19/2007  . LOW BACK PAIN SYNDROME 08/19/2007  . UPPER RESPIRATORY INFECTION, VIRAL 03/05/2007  . NEOPLASM, SKIN, UNCERTAIN BEHAVIOR 30/16/0109  . GANGLION CYST, WRIST, LEFT 01/28/2007  . DENTAL PAIN 01/19/2007  . DM (diabetes mellitus), type 2, uncontrolled with complications ([place]) 32/35/5732  . Hyperlipidemia with target LDL less than 70 12/10/2006  . Essential hypertension 12/10/2006  . RHINITIS, ALLERGIC NEC 12/10/2006  . HIP PAIN, RIGHT 12/10/2006    Leah Norton 02/20/2018, 10:27 AM  [facility] [address] [place], [place], [address] Phone: [phone]   Fax:  [phone]  Name: Leah Norton MRN: [id number] Date of Birth: October 17, 1955

## 2018-02-20 NOTE — Therapy (Signed)
[facility] [address] [place], [place], [address] Phone: [phone]   Fax:  [phone]  Speech Language Pathology Treatment  Patient Details  Name: Leah Norton MRN: [id number] Date of Birth: [birth year] Referring Provider (SLP): Seward Carol, MD   Encounter Date: 02/20/2018  End of Session - 02/20/18 1202    Visit Number  4    Number of Visits  21    Date for SLP Re-Evaluation  04/12/18    SLP Start Time  32    SLP Stop Time   1100    SLP Time Calculation (min)  40 min    Activity Tolerance  Patient tolerated treatment well       Past Medical History:  Diagnosis Date  . Anginal pain ([place])   . Arthritis    "right anikle" (02/29/2016)  . CAD in native artery 02/08/2016   Three-vessel coronary artery calcification on CT scan  . Complication of anesthesia    "had reaction w/hives, rash, itching" (02/29/2016)  . Constipation   . Coronary artery disease    12/17 PCI with DESx1 to pLAD EF normal  . Enlarged heart   . GERD (gastroesophageal reflux disease)   . High cholesterol   . Hypertension   . Migraine    hx  . Symptomatic anemia 07/10/2016  . Type II diabetes mellitus ([place])     Past Surgical History:  Procedure Laterality Date  . ABDOMINAL HYSTERECTOMY    . CARDIAC CATHETERIZATION N/A 02/29/2016   Procedure: Left Heart Cath and Coronary Angiography;  Surgeon: Belva Crome, MD;  Location: [facility];  Service: Cardiovascular;  Laterality: N/A;  . CARDIAC CATHETERIZATION N/A 02/29/2016   Procedure: Coronary Stent Intervention;  Surgeon: Belva Crome, MD;  Location: [facility];  Service: Cardiovascular;  Laterality: N/A;  . CARDIAC CATHETERIZATION  2005   "no blockages"  . COLONOSCOPY WITH PROPOFOL N/A 03/15/2014   Procedure: COLONOSCOPY WITH PROPOFOL;  Surgeon: Garlan Fair, MD;  Location: [facility];  Service: Endoscopy;  Laterality: N/A;  . CORONARY ANGIOPLASTY WITH  STENT PLACEMENT    . ESOPHAGOGASTRODUODENOSCOPY N/A 07/11/2016   Procedure: ESOPHAGOGASTRODUODENOSCOPY (EGD);  Surgeon: Laurence Spates, MD;  Location: [facility];  Service: Endoscopy;  Laterality: N/A;  . FRACTURE SURGERY    . MULTIPLE TOOTH EXTRACTIONS     teeth removed  . ORIF ANKLE FRACTURE Right 11/12/2012   Procedure: OPEN REDUCTION INTERNAL FIXATION (ORIF) ANKLE FRACTURE;  Surgeon: Marianna Payment, MD;  Location: [facility];  Service: Orthopedics;  Laterality: Right;  Open reduction internal fixation ankle fracture, trimalleolar    There were no vitals filed for this visit.  Subjective Assessment - 02/20/18 1029    Subjective  Pt arrives with SEVEN-ring binder from home.   (Pended)     Currently in Pain?  No/denies  (Pended)             ADULT SLP TREATMENT - 02/20/18 1153      General Information   Behavior/Cognition  Alert;Cooperative;Impulsive      Treatment Provided   Treatment provided  Cognitive-Linquistic      Cognitive-Linquistic Treatment   Treatment focused on  Cognition    Skilled Treatment  Pt arrived with a binder that would not work for a [employer] - 7-hole instead of 3-hole.  Pt unaware that this notebook was 7-holed. SLP worked with pt reading and organizing her papers to pue in her notebook; pt req'd mod A usually for  selective attention (internal distraction mostly) and attention to detail.  Pt with tangential conversation distracting her from staying on task as well. Pt needed to re-make  the punches for her papers due to impulsivity and not ensuring all papers were in the hole punch area in the hole puncher.  Homework to bring 3 RING BINDER tomorrow.       Assessment / Recommendations / Plan   Plan  Continue with current plan of care         SLP Short Term Goals - 02/20/18 1204      SLP SHORT TERM GOAL #1   Title  Pt will respond with personally relevant sentences 18/20 with [facility] speech fluidity over three sessions with use of compensations (slow  rate)    Time  3    Period  Weeks    Status  On-going      SLP SHORT TERM GOAL #2   Title  Pt will use a memory compensation system to record, reference and organize daily activities/appointments with occasional min A x 2 sessions     Time  3    Period  Weeks    Status  On-going      SLP SHORT TERM GOAL #3   Title  Pt will tell SLP 2 cognitive deficits that would impact her at work.    Time  3    Period  Weeks    Status  On-going      SLP SHORT TERM GOAL #4   Title  Pt will demo attention to detail by recalling pertinent information from 3-5 sentence auditory messages 85% accuracy x 3 sessions (verbal/ written summary or responding to questions)    Time  3    Period  Weeks    Status  On-going       SLP Long Term Goals - 02/20/18 1204      SLP LONG TERM GOAL #1   Title  Using compensatory strategies for fluency/dysarthria, pt will functionally ask investigative questions or give a verbal report of functional scenarios over 4 sessions    Time  7    Period  Weeks    Status  On-going      SLP LONG TERM GOAL #2   Title  Pt will demo WFL fluidity of speech in 10 minutes mod to complex/complex conversation with modified independence outside of ST room x 3 sessions    Time  7    Period  Weeks    Status  On-going      SLP LONG TERM GOAL #3   Title  pt will demo functional executive function (e.g., planning, organization) ability in personally relevant tasks (written and/or auditorily provided) 90% of the time     Time  7    Period  Weeks    Status  On-going      SLP LONG TERM GOAL #4   Title  Pt will demo 15 minutes alternating attention between two mod complex cognitive tasks to achieve >85% success on each task over two sessions    Time  7    Period  Weeks    Status  On-going      SLP LONG TERM GOAL #5   Title  pt will demo WNL emergent awareness with mod complex tasks in order to achieve >95% success over three sessions     Time  7    Period  Weeks    Status  On-going        Plan -  02/20/18 1203    Clinical Impression Statement  Patient presents with moderate cognitive communication deficits, and mild dysarthria. Pt had no dysfluencies today in conversation  Impairements in selelctive attn, attn to detail, executive function, and awareness noted today, as well as impulsivity. 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 Deficits in attention, recall, and organization would hinder her ability to complete job duties at this time, including completing investigations, dictation, and writing reports. I recommend skilled ST to maximize cognitive and communicative function for possible return to work and improved independence and QOL    Speech Therapy Frequency  3x / week   3x per week for 4 weeks, then 2x per week for 4 weeks   Duration  --   8 weeks or 21 visits total   Treatment/Interventions  Cognitive reorganization;Multimodal communcation approach;Compensatory strategies;Compensatory techniques;Cueing hierarchy;Internal/external aids;Functional tasks;SLP instruction and feedback;Patient/family education    Potential to Achieve Goals  Good       Patient will benefit from skilled therapeutic intervention in order to improve the following deficits and impairments:   Cognitive communication deficit  Dysarthria and anarthria    Problem List Patient Active Problem List   Diagnosis Date Noted  . Acute ischemic stroke ([place]) 01/16/2018  . Acute CVA (cerebrovascular accident) ([place]) 01/16/2018  . GI bleed 07/10/2016  . Morbid obesity due to excess calories ([place]) 05/14/2016  . Dyspnea on exertion 04/29/2016  . Abnormal nuclear stress test   . CAD S/P percutaneous coronary angioplasty 02/08/2016  . INSOMNIA, CHRONIC 02/28/2010  . HEADACHE 02/28/2010  . HEEL PAIN, RIGHT 11/29/2009  . COUGH DUE TO ACE INHIBITORS 11/29/2009  . ATRIAL ENLARGEMENT, LEFT 03/21/2009  . ANEMIA  12/28/2008  . CARDIAC MURMUR 12/28/2008  . OTHER NONSPECIFIC ABNORMAL SERUM ENZYME LEVELS 12/28/2008  . CERVICAL MUSCLE STRAIN 12/28/2008  . CONSTIPATION 06/01/2008  . POSTMENOPAUSAL STATUS 05/15/2008  . BRONCHITIS, CHRONIC 12/18/2007  . Cigarette smoker 08/19/2007  . DEGENERATIVE DISC DISEASE, LUMBOSACRAL SPINE 08/19/2007  . LOW BACK PAIN SYNDROME 08/19/2007  . UPPER RESPIRATORY INFECTION, VIRAL 03/05/2007  . NEOPLASM, SKIN, UNCERTAIN BEHAVIOR 23/34/3568  . GANGLION CYST, WRIST, LEFT 01/28/2007  . DENTAL PAIN 01/19/2007  . DM (diabetes mellitus), type 2, uncontrolled with complications ([place]) 61/68/3729  . Hyperlipidemia with target LDL less than 70 12/10/2006  . Essential hypertension 12/10/2006  . RHINITIS, ALLERGIC NEC 12/10/2006  . HIP PAIN, RIGHT 12/10/2006    [facility] ,MS, CCC-SLP  02/20/2018, 12:04 PM  [facility] [address] [place], [place], [address] Phone: [phone]   Fax:  [phone]   Name: Leah Norton MRN: [id number] Date of Birth: 07-11-1955

## 2018-02-21 ENCOUNTER — Ambulatory Visit: Payer: BC Managed Care – PPO | Admitting: Physical Therapy

## 2018-02-21 ENCOUNTER — Ambulatory Visit: Payer: BC Managed Care – PPO

## 2018-02-21 ENCOUNTER — Encounter: Payer: Self-pay | Admitting: Physical Therapy

## 2018-02-21 DIAGNOSIS — M6281 Muscle weakness (generalized): Secondary | ICD-10-CM

## 2018-02-21 DIAGNOSIS — R471 Dysarthria and anarthria: Secondary | ICD-10-CM

## 2018-02-21 DIAGNOSIS — R278 Other lack of coordination: Secondary | ICD-10-CM | POA: Diagnosis not present

## 2018-02-21 DIAGNOSIS — R2681 Unsteadiness on feet: Secondary | ICD-10-CM

## 2018-02-21 DIAGNOSIS — R2689 Other abnormalities of gait and mobility: Secondary | ICD-10-CM

## 2018-02-21 DIAGNOSIS — R41841 Cognitive communication deficit: Secondary | ICD-10-CM

## 2018-02-21 NOTE — Therapy (Signed)
[facility] [address] [place], [place], [address] Phone: [phone]   Fax:  [phone]  Speech Language Pathology Treatment  Patient Details  Name: Leah Norton MRN: [id number] Date of Birth: 07/26/1955 Referring Provider (SLP): Seward Carol, MD   Encounter Date: 02/21/2018  End of Session - 02/21/18 0851    Visit Number  5    Number of Visits  21    Date for SLP Re-Evaluation  04/12/18    SLP Start Time  0810   9 minutes late   SLP Stop Time   0848    SLP Time Calculation (min)  38 min    Activity Tolerance  Patient tolerated treatment well       Past Medical History:  Diagnosis Date  . Anginal pain ([place])   . Arthritis    "right anikle" (02/29/2016)  . CAD in native artery 02/08/2016   Three-vessel coronary artery calcification on CT scan  . Complication of anesthesia    "had reaction w/hives, rash, itching" (02/29/2016)  . Constipation   . Coronary artery disease    12/17 PCI with DESx1 to pLAD EF normal  . Enlarged heart   . GERD (gastroesophageal reflux disease)   . High cholesterol   . Hypertension   . Migraine    hx  . Symptomatic anemia 07/10/2016  . Type II diabetes mellitus ([place])     Past Surgical History:  Procedure Laterality Date  . ABDOMINAL HYSTERECTOMY    . CARDIAC CATHETERIZATION N/A 02/29/2016   Procedure: Left Heart Cath and Coronary Angiography;  Surgeon: Belva Crome, MD;  Location: [facility];  Service: Cardiovascular;  Laterality: N/A;  . CARDIAC CATHETERIZATION N/A 02/29/2016   Procedure: Coronary Stent Intervention;  Surgeon: Belva Crome, MD;  Location: [facility];  Service: Cardiovascular;  Laterality: N/A;  . CARDIAC CATHETERIZATION  2005   "no blockages"  . COLONOSCOPY WITH PROPOFOL N/A 03/15/2014   Procedure: COLONOSCOPY WITH PROPOFOL;  Surgeon: Garlan Fair, MD;  Location: [facility];  Service: Endoscopy;  Laterality: N/A;  . CORONARY  ANGIOPLASTY WITH STENT PLACEMENT    . ESOPHAGOGASTRODUODENOSCOPY N/A 07/11/2016   Procedure: ESOPHAGOGASTRODUODENOSCOPY (EGD);  Surgeon: Laurence Spates, MD;  Location: [facility] ENDOSCOPY;  Service: Endoscopy;  Laterality: N/A;  . FRACTURE SURGERY    . MULTIPLE TOOTH EXTRACTIONS     teeth removed  . ORIF ANKLE FRACTURE Right 11/12/2012   Procedure: OPEN REDUCTION INTERNAL FIXATION (ORIF) ANKLE FRACTURE;  Surgeon: Marianna Payment, MD;  Location: [facility];  Service: Orthopedics;  Laterality: Right;  Open reduction internal fixation ankle fracture, trimalleolar    There were no vitals filed for this visit.  Subjective Assessment - 02/21/18 0815    Subjective  Pt arrived with blue 3" 3-ring binder, with papers organized.    Currently in Pain?  No/denies            ADULT SLP TREATMENT - 02/21/18 0815      General Information   Behavior/Cognition  Alert;Cooperative;Impulsive      Treatment Provided   Treatment provided  Cognitive-Linquistic      Cognitive-Linquistic Treatment   Treatment focused on  Cognition    Skilled Treatment  (cognition 18 minutes)Pt arrived with notebook organized but with paper clips still on papers from yesterday. To target attention pt was asked to write down all her meds - she thought of 6/9 spontaneously and thought to look at her med list in her notebook for  the last 3. Pt req'd min A rarely to stay on task, and to take the time to spell the meds correctly. Pt stated she doe snot have anyone helping her with her meds - they are in a box. (speech tx) SLP developed  a HEP for pt's oral musculature (see pt instructions).      Assessment / Recommendations / Plan   Plan  Continue with current plan of care      Progression Toward Goals   Progression toward goals  Progressing toward goals       SLP Education - 02/21/18 0851    Education Details  HEP for dysarthria    Person(s) Educated  Patient    Methods  Explanation;Demonstration;Verbal cues;Handout     Comprehension  Verbalized understanding;Returned demonstration;Verbal cues required;Tactile cues required       SLP Short Term Goals - 02/21/18 1103      SLP SHORT TERM GOAL #1   Title  Pt will respond with personally relevant sentences 18/20 with [facility] speech fluidity over three sessions with use of compensations (slow rate)    Time  3    Period  Weeks    Status  On-going      SLP SHORT TERM GOAL #2   Title  Pt will use a memory compensation system to record, reference and organize daily activities/appointments with occasional min A x 2 sessions     Time  3    Period  Weeks    Status  On-going      SLP SHORT TERM GOAL #3   Title  Pt will tell SLP 2 cognitive deficits that would impact her at work.    Time  3    Period  Weeks    Status  On-going      SLP SHORT TERM GOAL #4   Title  Pt will demo attention to detail by recalling pertinent information from 3-5 sentence auditory messages 85% accuracy x 3 sessions (verbal/ written summary or responding to questions)    Time  3    Period  Weeks    Status  On-going       SLP Long Term Goals - 02/21/18 1103      SLP LONG TERM GOAL #1   Title  Using compensatory strategies for fluency/dysarthria, pt will functionally ask investigative questions or give a verbal report of functional scenarios over 4 sessions    Time  7    Period  Weeks    Status  On-going      SLP LONG TERM GOAL #2   Title  Pt will demo WFL fluidity of speech in 10 minutes mod to complex/complex conversation with modified independence outside of ST room x 3 sessions    Time  7    Period  Weeks    Status  On-going      SLP LONG TERM GOAL #3   Title  pt will demo functional executive function (e.g., planning, organization) ability in personally relevant tasks (written and/or auditorily provided) 90% of the time     Time  7    Period  Weeks    Status  On-going      SLP LONG TERM GOAL #4   Title  Pt will demo 15 minutes alternating attention between two mod  complex cognitive tasks to achieve >85% success on each task over two sessions    Time  7    Period  Weeks    Status  On-going  SLP LONG TERM GOAL #5   Title  pt will demo WNL emergent awareness with mod complex tasks in order to achieve >95% success over three sessions     Time  7    Period  Weeks    Status  On-going       Plan - 02/21/18 1102    Clinical Impression Statement  Patient presents with moderate cognitive communication deficits, and mild dysarthria. Very mild, rare dysfluencies today in conversation  SLP developed HEP for pt's dysarthria today adn reviewed it with pt. Impairements in selelctive attn, attn to detail, executive function, and awareness noted today, as well as impulsivity. 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 Deficits in attention, recall, and organization would hinder her ability to complete job duties at this time, including completing investigations, dictation, and writing reports. I recommend skilled ST to maximize cognitive and communicative function for possible return to work and improved independence and QOL    Speech Therapy Frequency  3x / week   3x per week for 4 weeks, then 2x per week for 4 weeks   Duration  --   8 weeks or 21 visits total   Treatment/Interventions  Cognitive reorganization;Multimodal communcation approach;Compensatory strategies;Compensatory techniques;Cueing hierarchy;Internal/external aids;Functional tasks;SLP instruction and feedback;Patient/family education    Potential to Achieve Goals  Good       Patient will benefit from skilled therapeutic intervention in order to improve the following deficits and impairments:   Cognitive communication deficit  Dysarthria and anarthria    Problem List Patient Active Problem List   Diagnosis Date Noted  . Acute ischemic stroke ([place]) 01/16/2018  . Acute CVA (cerebrovascular accident)  ([place]) 01/16/2018  . GI bleed 07/10/2016  . Morbid obesity due to excess calories ([place]) 05/14/2016  . Dyspnea on exertion 04/29/2016  . Abnormal nuclear stress test   . CAD S/P percutaneous coronary angioplasty 02/08/2016  . INSOMNIA, CHRONIC 02/28/2010  . HEADACHE 02/28/2010  . HEEL PAIN, RIGHT 11/29/2009  . COUGH DUE TO ACE INHIBITORS 11/29/2009  . ATRIAL ENLARGEMENT, LEFT 03/21/2009  . ANEMIA 12/28/2008  . CARDIAC MURMUR 12/28/2008  . OTHER NONSPECIFIC ABNORMAL SERUM ENZYME LEVELS 12/28/2008  . CERVICAL MUSCLE STRAIN 12/28/2008  . CONSTIPATION 06/01/2008  . POSTMENOPAUSAL STATUS 05/15/2008  . BRONCHITIS, CHRONIC 12/18/2007  . Cigarette smoker 08/19/2007  . DEGENERATIVE DISC DISEASE, LUMBOSACRAL SPINE 08/19/2007  . LOW BACK PAIN SYNDROME 08/19/2007  . UPPER RESPIRATORY INFECTION, VIRAL 03/05/2007  . NEOPLASM, SKIN, UNCERTAIN BEHAVIOR 55/37/4827  . GANGLION CYST, WRIST, LEFT 01/28/2007  . DENTAL PAIN 01/19/2007  . DM (diabetes mellitus), type 2, uncontrolled with complications ([place]) 07/86/7544  . Hyperlipidemia with target LDL less than 70 12/10/2006  . Essential hypertension 12/10/2006  . RHINITIS, ALLERGIC NEC 12/10/2006  . HIP PAIN, RIGHT 12/10/2006    [facility] ,MS, [place]  02/21/2018, 11:03 AM  [facility] [address] [place], [place], [address] Phone: [phone]   Fax:  [phone]   Name: Leah Norton MRN: [id number] Date of Birth: 22-Jun-1955

## 2018-02-21 NOTE — Therapy (Signed)
[facility] [address] [place], [place], [address] Phone: [phone]   Fax:  [phone]  Physical Therapy Treatment  Patient Details  Name: Leah Norton MRN: [id number] Date of Birth: December 21, 1955 Referring Provider (PT): Seward Carol, MD   Encounter Date: 02/21/2018  PT End of Session - 02/21/18 1149    Visit Number  5    Number of Visits  23    Date for PT Re-Evaluation  05/12/18    Authorization Type  State BCBS    PT Start Time  [phone]    PT Stop Time  0930    PT Time Calculation (min)  40 min    Activity Tolerance  Patient tolerated treatment well;Other (comment)   pt needed frequent seated rest breaks   Behavior During Therapy  WFL for tasks assessed/performed       Past Medical History:  Diagnosis Date  . Anginal pain ([place])   . Arthritis    "right anikle" (02/29/2016)  . CAD in native artery 02/08/2016   Three-vessel coronary artery calcification on CT scan  . Complication of anesthesia    "had reaction w/hives, rash, itching" (02/29/2016)  . Constipation   . Coronary artery disease    12/17 PCI with DESx1 to pLAD EF normal  . Enlarged heart   . GERD (gastroesophageal reflux disease)   . High cholesterol   . Hypertension   . Migraine    hx  . Symptomatic anemia 07/10/2016  . Type II diabetes mellitus ([place])     Past Surgical History:  Procedure Laterality Date  . ABDOMINAL HYSTERECTOMY    . CARDIAC CATHETERIZATION N/A 02/29/2016   Procedure: Left Heart Cath and Coronary Angiography;  Surgeon: Belva Crome, MD;  Location: [facility];  Service: Cardiovascular;  Laterality: N/A;  . CARDIAC CATHETERIZATION N/A 02/29/2016   Procedure: Coronary Stent Intervention;  Surgeon: Belva Crome, MD;  Location: [facility];  Service: Cardiovascular;  Laterality: N/A;  . CARDIAC CATHETERIZATION  2005   "no blockages"  . COLONOSCOPY WITH PROPOFOL N/A 03/15/2014   Procedure: COLONOSCOPY  WITH PROPOFOL;  Surgeon: Garlan Fair, MD;  Location: [facility];  Service: Endoscopy;  Laterality: N/A;  . CORONARY ANGIOPLASTY WITH STENT PLACEMENT    . ESOPHAGOGASTRODUODENOSCOPY N/A 07/11/2016   Procedure: ESOPHAGOGASTRODUODENOSCOPY (EGD);  Surgeon: Laurence Spates, MD;  Location: [facility];  Service: Endoscopy;  Laterality: N/A;  . FRACTURE SURGERY    . MULTIPLE TOOTH EXTRACTIONS     teeth removed  . ORIF ANKLE FRACTURE Right 11/12/2012   Procedure: OPEN REDUCTION INTERNAL FIXATION (ORIF) ANKLE FRACTURE;  Surgeon: Marianna Payment, MD;  Location: [facility];  Service: Orthopedics;  Laterality: Right;  Open reduction internal fixation ankle fracture, trimalleolar    There were no vitals filed for this visit.  Subjective Assessment - 02/21/18 0903    Subjective  Denies falls.  Has been working on [employer].  Did not initially recognize seeing this PTA on a previous visit.    Pertinent History  PMH includes essential HTN, hyperlipidemia, DM, DDD lumbo-sacral spine; Hx of CVA 01/15/18    Patient Stated Goals  Pt's goals for physical therapy are to do whatever I can to return to normal.    Currently in Pain?  No/denies                       [facility] Adult PT Treatment/Exercise - 02/21/18 0001      Transfers  Sit to Stand  5: Supervision;Without upper extremity assist;From chair/3-in-1;Other (comment)   intermittent UE support   Stand to Sit  5: Supervision;Without upper extremity assist;To chair/3-in-1    Number of Reps  10 reps;2 sets    Comments  one set performed with R LE on 4" stool       Knee/Hip Exercises: Standing   Heel Raises  Both;15 reps;2 seconds;Other (comment)   3# wt with UE support   Knee Flexion  Left;15 reps;Other (comment)   Hamstring curls standing with 3# wt;cues for technique   Hip Flexion  AROM;Stengthening;Both;15 reps;Knee bent;Other (comment)   3# weights bil with UE support   Hip Abduction  AROM;Both;15 reps;Knee straight;Other (comment)   3#  weight bil LE's with UE support   Hip Extension  AROM;Both;15 reps;Knee straight;Other (comment)   3# weight bil LE with UE support   Forward Step Up  Both;15 reps;Hand Hold: 1;Step Height: 6"    Rocker Board  5 minutes;Other (comment)   forward/backward with intermittent UE and head nods/turns   Other Standing Knee Exercises  Pt needs frequent seated rest breaks during session               PT Short Term Goals - 02/14/18 2057      PT SHORT TERM GOAL #1   Title  Pt will be independent with HEP for improved strength, balance, gait.  TARGET 03/14/18    Time  5    Period  Weeks      PT SHORT TERM GOAL #2   Title  Pt will improve DGI score to at least 21/24 for decreased fall risk.    Time  5    Period  Weeks    Status  New      PT SHORT TERM GOAL #3   Title  Pt to improve 6 minute walk test goal by at least 150 ft for improved gait efficiency in community.    Baseline  02/13/18:  866 ft in 6MWT    Time  5    Period  Weeks    Status  Revised      PT SHORT TERM GOAL #4   Title  Pt will verbalize understanding of CVA warning signs and symptoms.    Time  5    Period  Weeks    Status  New        PT Long Term Goals - 02/11/18 1459      PT LONG TERM GOAL #1   Title  Pt will verbalize understanding of fall prevention in the home environment.  TARGET 04/11/18    Time  9    Period  Weeks    Status  New    Target Date  04/11/18      PT LONG TERM GOAL #2   Title  Pt will improve 5x sit<>stand score to less than or equal to 11.4 seconds for improved functional strength and transfer efficiency.    Time  9    Period  Weeks    Status  New    Target Date  04/11/18      PT LONG TERM GOAL #3   Title  Pt will improve gait velocity to at least 2.62 ft/sec for improved gait efficiency and safety.    Time  9    Period  Weeks    Status  New    Target Date  04/11/18      PT LONG TERM GOAL #4   Title  Pt will ambulate at least 1000 ft, indoor and outdoor surfaces, independently  no LOB, for improved gait negotiation outdoor surfaces.    Time  9    Period  Weeks    Status  New    Target Date  04/11/18            Plan - 02/21/18 1150    Clinical Impression Statement  Skilled session focused on strengthening and balance.  Pt distracted at times during session and has difficulty following instructions at times.  Continue PT per POC.    Rehab Potential  Good    PT Frequency  Other (comment)   1x/wk in addition to eval; then 3x/wk for 4 weeks, 2x/wk for 4 weeks   PT Treatment/Interventions  ADLs/Self Care Home Management;Gait training;Functional mobility training;Therapeutic activities;Therapeutic exercise;Balance training;Patient/family education;Neuromuscular re-education    PT Next Visit Plan  Continue with strength and balance, gait for L step length and L knee control.  SLS activities.    Consulted and Agree with Plan of Care  Patient       Patient will benefit from skilled therapeutic intervention in order to improve the following deficits and impairments:  Abnormal gait, Decreased balance, Decreased mobility, Difficulty walking, Decreased strength  Visit Diagnosis: Other abnormalities of gait and mobility  Muscle weakness (generalized)  Unsteadiness on feet     Problem List Patient Active Problem List   Diagnosis Date Noted  . Acute ischemic stroke ([place]) 01/16/2018  . Acute CVA (cerebrovascular accident) ([place]) 01/16/2018  . GI bleed 07/10/2016  . Morbid obesity due to excess calories ([place]) 05/14/2016  . Dyspnea on exertion 04/29/2016  . Abnormal nuclear stress test   . CAD S/P percutaneous coronary angioplasty 02/08/2016  . INSOMNIA, CHRONIC 02/28/2010  . HEADACHE 02/28/2010  . HEEL PAIN, RIGHT 11/29/2009  . COUGH DUE TO ACE INHIBITORS 11/29/2009  . ATRIAL ENLARGEMENT, LEFT 03/21/2009  . ANEMIA 12/28/2008  . CARDIAC MURMUR 12/28/2008  . OTHER NONSPECIFIC ABNORMAL SERUM ENZYME LEVELS 12/28/2008  . CERVICAL MUSCLE STRAIN 12/28/2008  .  CONSTIPATION 06/01/2008  . POSTMENOPAUSAL STATUS 05/15/2008  . BRONCHITIS, CHRONIC 12/18/2007  . Cigarette smoker 08/19/2007  . DEGENERATIVE DISC DISEASE, LUMBOSACRAL SPINE 08/19/2007  . LOW BACK PAIN SYNDROME 08/19/2007  . UPPER RESPIRATORY INFECTION, VIRAL 03/05/2007  . NEOPLASM, SKIN, UNCERTAIN BEHAVIOR 93/79/0240  . GANGLION CYST, WRIST, LEFT 01/28/2007  . DENTAL PAIN 01/19/2007  . DM (diabetes mellitus), type 2, uncontrolled with complications ([place]) 97/35/3299  . Hyperlipidemia with target LDL less than 70 12/10/2006  . Essential hypertension 12/10/2006  . RHINITIS, ALLERGIC NEC 12/10/2006  . HIP PAIN, RIGHT 12/10/2006    Narda Bonds, PTA [facility] 02/21/18 11:57 AM Phone: [phone] Fax: [facility] [address] [place], [place], [address] Phone: [phone]   Fax:  [phone]  Name: Leah Norton MRN: [id number] Date of Birth: October 24, 1955

## 2018-02-21 NOTE — Patient Instructions (Addendum)
LIP and TONGUE exercises  --- DO ALL EXERCISES TWICE EACH DAY  1. Lip press - Press your lips together firmly (like making a /m/ sound) and hold 5 seconds  -repeat 15 times  2. LOUD and LONG [place] - make a kissing sound as loud as you can, as long as you can  - repeat 15 times  3. Tongue sweep - Sweep your tongue in the pockets of your mouth - touch every tooth   - five-ten revolutions each way  4. PUH! - 10 times       MAKE THE SOUNDS STRONG      TUH - 10 times      KUH! - 10 times  PUH-TUH-KUH! - 10 times  5. Move a licorice stick, a "dum-dum" sucker, or a straw from one side to the other in your mouth  - 15 back and forth movements  6. Say "OOOOO", and "EEEEE" 15 times (Kiss and smile)  7. Put air in your cheeks, and push on either side 15 times  *KEEP THE AIR IN and DON'T LET IT ESCAPE!!*

## 2018-02-24 ENCOUNTER — Ambulatory Visit: Payer: BC Managed Care – PPO | Admitting: *Deleted

## 2018-02-24 ENCOUNTER — Ambulatory Visit: Payer: BC Managed Care – PPO | Admitting: Speech Pathology

## 2018-02-24 ENCOUNTER — Ambulatory Visit: Payer: BC Managed Care – PPO | Admitting: Physical Therapy

## 2018-02-24 ENCOUNTER — Encounter: Payer: Self-pay | Admitting: *Deleted

## 2018-02-24 ENCOUNTER — Encounter: Payer: Self-pay | Admitting: Physical Therapy

## 2018-02-24 DIAGNOSIS — R2689 Other abnormalities of gait and mobility: Secondary | ICD-10-CM

## 2018-02-24 DIAGNOSIS — R2681 Unsteadiness on feet: Secondary | ICD-10-CM

## 2018-02-24 DIAGNOSIS — M6281 Muscle weakness (generalized): Secondary | ICD-10-CM

## 2018-02-24 DIAGNOSIS — R41844 Frontal lobe and executive function deficit: Secondary | ICD-10-CM

## 2018-02-24 DIAGNOSIS — R278 Other lack of coordination: Secondary | ICD-10-CM | POA: Diagnosis not present

## 2018-02-24 DIAGNOSIS — R471 Dysarthria and anarthria: Secondary | ICD-10-CM

## 2018-02-24 DIAGNOSIS — R41841 Cognitive communication deficit: Secondary | ICD-10-CM

## 2018-02-24 NOTE — Therapy (Signed)
[facility] [address] [place], [place], [address] Phone: [phone]   Fax:  [phone]  Speech Language Pathology Treatment  Patient Details  Name: Leah Norton MRN: [id number] Date of Birth: [birth year] Referring Provider (SLP): Seward Carol, MD   Encounter Date: 02/24/2018  End of Session - 02/24/18 1116    Visit Number  6    Number of Visits  21    Date for SLP Re-Evaluation  04/12/18    Authorization Type  BCBS    SLP Start Time  1016    SLP Stop Time   1100    SLP Time Calculation (min)  44 min    Activity Tolerance  Patient tolerated treatment well       Past Medical History:  Diagnosis Date  . Anginal pain ([place])   . Arthritis    "right anikle" (02/29/2016)  . CAD in native artery 02/08/2016   Three-vessel coronary artery calcification on CT scan  . Complication of anesthesia    "had reaction w/hives, rash, itching" (02/29/2016)  . Constipation   . Coronary artery disease    12/17 PCI with DESx1 to pLAD EF normal  . Enlarged heart   . GERD (gastroesophageal reflux disease)   . High cholesterol   . Hypertension   . Migraine    hx  . Symptomatic anemia 07/10/2016  . Type II diabetes mellitus ([place])     Past Surgical History:  Procedure Laterality Date  . ABDOMINAL HYSTERECTOMY    . CARDIAC CATHETERIZATION N/A 02/29/2016   Procedure: Left Heart Cath and Coronary Angiography;  Surgeon: Belva Crome, MD;  Location: [facility];  Service: Cardiovascular;  Laterality: N/A;  . CARDIAC CATHETERIZATION N/A 02/29/2016   Procedure: Coronary Stent Intervention;  Surgeon: Belva Crome, MD;  Location: [facility];  Service: Cardiovascular;  Laterality: N/A;  . CARDIAC CATHETERIZATION  2005   "no blockages"  . COLONOSCOPY WITH PROPOFOL N/A 03/15/2014   Procedure: COLONOSCOPY WITH PROPOFOL;  Surgeon: Garlan Fair, MD;  Location: [facility];  Service: Endoscopy;  Laterality: N/A;   . CORONARY ANGIOPLASTY WITH STENT PLACEMENT    . ESOPHAGOGASTRODUODENOSCOPY N/A 07/11/2016   Procedure: ESOPHAGOGASTRODUODENOSCOPY (EGD);  Surgeon: Laurence Spates, MD;  Location: [facility];  Service: Endoscopy;  Laterality: N/A;  . FRACTURE SURGERY    . MULTIPLE TOOTH EXTRACTIONS     teeth removed  . ORIF ANKLE FRACTURE Right 11/12/2012   Procedure: OPEN REDUCTION INTERNAL FIXATION (ORIF) ANKLE FRACTURE;  Surgeon: Marianna Payment, MD;  Location: [facility];  Service: Orthopedics;  Laterality: Right;  Open reduction internal fixation ankle fracture, trimalleolar    There were no vitals filed for this visit.  Subjective Assessment - 02/24/18 1018    Subjective  "Oh, I almost forgot he gave me that." (pt, re: HEP provided by prior therapist)    Currently in Pain?  Yes    Pain Score  5     Pain Location  Foot    Pain Orientation  Right    Pain Descriptors / Indicators  Aching    Pain Type  Chronic pain            ADULT SLP TREATMENT - 02/24/18 1016      General Information   Behavior/Cognition  Alert;Cooperative;Impulsive      Treatment Provided   Treatment provided  Cognitive-Linquistic      Cognitive-Linquistic Treatment   Treatment focused on  Dysarthria    Skilled Treatment  Pt frequently looking out window during initial conversation with therapist, so SLP closed blinds to minimize distractions. Pt required extended time to navigate to appropriate location in her binder for ST homework. Pt reports doing "some of it," re: exercises provided by previous therapist, however demo'd 2 exercises impulsively, incorrectly. SLP reviewed HEP with pt, who required usual mod A initially for most exercises, fading to occasional min cues to avoid rushing. SLP handed pt a pencil and pad of paper and asked pt how she would keep track of the number of repetitions, pt asked, "can you help me?" SLP told pt that she would need a way to keep track as SLP would not be able to assist pt with completing  this at home, and suggested making tallies or check marks for repetitions. Despite this instruction, pt did not initiate until she was directly cued to do so after each repetition, eventually able to fade cues to rare min A. SLP worked with pt to use SLOP (Slow, Loud, Over-articulate, Pause) strategies with personally relevant words (security/public safety radio codes). Pt able to use accurately with imitation.       Assessment / Recommendations / Plan   Plan  Continue with current plan of care      Progression Toward Goals   Progression toward goals  Progressing toward goals       SLP Education - 02/24/18 1115    Education Details  compensations for attention when doing HEP for dysarthria, use tallies, practice in front of a Banker) Educated  Patient    Methods  Explanation;Demonstration;Tactile cues;Verbal cues;Handout    Comprehension  Verbalized understanding;Returned demonstration;Verbal cues required;Need further instruction       SLP Short Term Goals - 02/24/18 1117      SLP SHORT TERM GOAL #1   Title  Pt will respond with personally relevant sentences 18/20 with [facility] speech fluidity over three sessions with use of compensations (slow rate)    Time  2    Period  Weeks    Status  On-going      SLP SHORT TERM GOAL #2   Title  Pt will use a memory compensation system to record, reference and organize daily activities/appointments with occasional min A x 2 sessions     Time  2    Period  Weeks    Status  On-going      SLP SHORT TERM GOAL #3   Title  Pt will tell SLP 2 cognitive deficits that would impact her at work.    Time  2    Period  Weeks    Status  On-going      SLP SHORT TERM GOAL #4   Title  Pt will demo attention to detail by recalling pertinent information from 3-5 sentence auditory messages 85% accuracy x 3 sessions (verbal/ written summary or responding to questions)    Time  2    Period  Weeks    Status  On-going       SLP Long Term Goals -  02/24/18 1118      SLP LONG TERM GOAL #1   Title  Using compensatory strategies for fluency/dysarthria, pt will functionally ask investigative questions or give a verbal report of functional scenarios over 4 sessions    Time  6    Period  Weeks    Status  On-going      SLP LONG TERM GOAL #2   Title  Pt will demo WFL fluidity of  speech in 10 minutes mod to complex/complex conversation with modified independence outside of ST room x 3 sessions    Time  6    Period  Weeks    Status  On-going      SLP LONG TERM GOAL #3   Title  pt will demo functional executive function (e.g., planning, organization) ability in personally relevant tasks (written and/or auditorily provided) 90% of the time     Time  6    Period  Weeks    Status  On-going      SLP LONG TERM GOAL #4   Title  Pt will demo 15 minutes alternating attention between two mod complex cognitive tasks to achieve >85% success on each task over two sessions    Time  6    Period  Weeks    Status  On-going      SLP LONG TERM GOAL #5   Title  pt will demo WNL emergent awareness with mod complex tasks in order to achieve >95% success over three sessions     Time  6    Period  Weeks    Status  On-going       Plan - 02/24/18 1116    Clinical Impression Statement  Patient presents with moderate cognitive communication deficits, and mild dysarthria. Very mild, rare dysfluencies today in conversation. Impairements in selective attn, attn to detail, executive function, and awareness noted today, as well as impulsivity. 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 Deficits in attention, recall, and organization would hinder her ability to complete job duties at this time, including completing investigations, dictation, and writing reports. I recommend skilled ST to maximize cognitive and communicative function for possible return to work and improved  independence and QOL    Speech Therapy Frequency  3x / week   3x per week for 4 weeks, then 2x per week for 4 weeks   Duration  --   8 weeks or 21 visits total   Treatment/Interventions  Cognitive reorganization;Multimodal communcation approach;Compensatory strategies;Compensatory techniques;Cueing hierarchy;Internal/external aids;Functional tasks;SLP instruction and feedback;Patient/family education    Potential to Achieve Goals  Good       Patient will benefit from skilled therapeutic intervention in order to improve the following deficits and impairments:   Dysarthria and anarthria  Cognitive communication deficit    Problem List Patient Active Problem List   Diagnosis Date Noted  . Acute ischemic stroke ([place]) 01/16/2018  . Acute CVA (cerebrovascular accident) ([place]) 01/16/2018  . GI bleed 07/10/2016  . Morbid obesity due to excess calories ([place]) 05/14/2016  . Dyspnea on exertion 04/29/2016  . Abnormal nuclear stress test   . CAD S/P percutaneous coronary angioplasty 02/08/2016  . INSOMNIA, CHRONIC 02/28/2010  . HEADACHE 02/28/2010  . HEEL PAIN, RIGHT 11/29/2009  . COUGH DUE TO ACE INHIBITORS 11/29/2009  . ATRIAL ENLARGEMENT, LEFT 03/21/2009  . ANEMIA 12/28/2008  . CARDIAC MURMUR 12/28/2008  . OTHER NONSPECIFIC ABNORMAL SERUM ENZYME LEVELS 12/28/2008  . CERVICAL MUSCLE STRAIN 12/28/2008  . CONSTIPATION 06/01/2008  . POSTMENOPAUSAL STATUS 05/15/2008  . BRONCHITIS, CHRONIC 12/18/2007  . Cigarette smoker 08/19/2007  . DEGENERATIVE DISC DISEASE, LUMBOSACRAL SPINE 08/19/2007  . LOW BACK PAIN SYNDROME 08/19/2007  . UPPER RESPIRATORY INFECTION, VIRAL 03/05/2007  . NEOPLASM, SKIN, UNCERTAIN BEHAVIOR 75/64/3329  . GANGLION CYST, WRIST, LEFT 01/28/2007  . DENTAL PAIN 01/19/2007  . DM (diabetes mellitus), type 2,  uncontrolled with complications ([place]) 07/62/2633  . Hyperlipidemia with target LDL less than 70 12/10/2006  . Essential hypertension 12/10/2006  . RHINITIS, ALLERGIC  NEC 12/10/2006  . HIP PAIN, RIGHT 12/10/2006   Deneise Lever, [place], [place] Speech-Language Pathologist  Aliene Altes 02/24/2018, 11:19 AM  [facility] [address] [place], [place], [address] Phone: [phone]   Fax:  [phone]   Name: Leah Norton MRN: [id number] Date of Birth: Jan 05, 1956

## 2018-02-24 NOTE — Patient Instructions (Addendum)
Remember to do your exercises twice per day. I recommend using a blank piece of paper and keeping a tally while you do it to help you stay focused and to do the right number. I also recommend practicing in front of a mirror.   When you practice your speech, remember SLOP (Slow, Loud, Over-pronounce, Pause)  10-74 Negative  "Ten. Seventy. Four. Negative" 10-76 In route Signal 50 - situation in control Code 9 - Break Code 2 - Meal break 10-85 - Alcohol/ narcotic violation 10-84 - Sexual assault 10-89 - Bomb threat 10-100 - No assistance needed Code 18 - Building inspection Code 8 - Meet at location Code 40 - Assistance provided Code 41 - Information provided

## 2018-02-24 NOTE — Therapy (Signed)
[facility] [address] [place], [place], [address] Phone: [phone]   Fax:  [phone]  Physical Therapy Treatment  Patient Details  Name: Leah Norton MRN: [id number] Date of Birth: [birth year] Referring Provider (PT): Seward Carol, MD   Encounter Date: 02/24/2018  PT End of Session - 02/24/18 0933    Visit Number  6    Number of Visits  23    Date for PT Re-Evaluation  05/12/18    Authorization Type  State BCBS    PT Start Time  [phone]   arrived late   PT Stop Time  0930    PT Time Calculation (min)  33 min    Activity Tolerance  Patient tolerated treatment well;Other (comment)   pt needed frequent seated rest breaks   Behavior During Therapy  WFL for tasks assessed/performed       Past Medical History:  Diagnosis Date  . Anginal pain ([place])   . Arthritis    "right anikle" (02/29/2016)  . CAD in native artery 02/08/2016   Three-vessel coronary artery calcification on CT scan  . Complication of anesthesia    "had reaction w/hives, rash, itching" (02/29/2016)  . Constipation   . Coronary artery disease    12/17 PCI with DESx1 to pLAD EF normal  . Enlarged heart   . GERD (gastroesophageal reflux disease)   . High cholesterol   . Hypertension   . Migraine    hx  . Symptomatic anemia 07/10/2016  . Type II diabetes mellitus ([place])     Past Surgical History:  Procedure Laterality Date  . ABDOMINAL HYSTERECTOMY    . CARDIAC CATHETERIZATION N/A 02/29/2016   Procedure: Left Heart Cath and Coronary Angiography;  Surgeon: Belva Crome, MD;  Location: [facility];  Service: Cardiovascular;  Laterality: N/A;  . CARDIAC CATHETERIZATION N/A 02/29/2016   Procedure: Coronary Stent Intervention;  Surgeon: Belva Crome, MD;  Location: [facility];  Service: Cardiovascular;  Laterality: N/A;  . CARDIAC CATHETERIZATION  2005   "no blockages"  . COLONOSCOPY WITH PROPOFOL N/A 03/15/2014   Procedure: COLONOSCOPY WITH PROPOFOL;  Surgeon: Garlan Fair, MD;  Location: [facility];  Service: Endoscopy;  Laterality: N/A;  . CORONARY ANGIOPLASTY WITH STENT PLACEMENT    . ESOPHAGOGASTRODUODENOSCOPY N/A 07/11/2016   Procedure: ESOPHAGOGASTRODUODENOSCOPY (EGD);  Surgeon: Laurence Spates, MD;  Location: [facility];  Service: Endoscopy;  Laterality: N/A;  . FRACTURE SURGERY    . MULTIPLE TOOTH EXTRACTIONS     teeth removed  . ORIF ANKLE FRACTURE Right 11/12/2012   Procedure: OPEN REDUCTION INTERNAL FIXATION (ORIF) ANKLE FRACTURE;  Surgeon: Marianna Payment, MD;  Location: [facility];  Service: Orthopedics;  Laterality: Right;  Open reduction internal fixation ankle fracture, trimalleolar    There were no vitals filed for this visit.  Subjective Assessment - 02/24/18 0900    Subjective  Pt reports having more pain in R LE since the CVA    Pertinent History  PMH includes essential HTN, hyperlipidemia, DM, DDD lumbo-sacral spine; Hx of CVA 01/15/18    Patient Stated Goals  Pt's goals for physical therapy are to do whatever I can to return to normal.    Currently in Pain?  Yes    Pain Score  4     Pain Location  Leg    Pain Orientation  Right    Pain Descriptors / Indicators  Aching    Pain Type  Chronic pain  Pain Onset  More than a month ago    Pain Frequency  Intermittent                       [facility] Adult PT Treatment/Exercise - 02/24/18 0001      Exercises   Exercises  Lumbar      Lumbar/CORE/ hip strengthening Exercises: Quadruped             Cues for abdominal activation each exercise   Straight Leg Raise  10 reps;5 seconds   cues for abdominal activation   Other Quadruped Lumbar Exercises  transitions: quadruped <>side sit, 5x2    Other Quadruped Lumbar Exercises  "fire hydrdant" hip abduction (knees bent) x10 held 5 seconds      Knee/Hip Exercises: Standing   Other Standing Knee Exercises  half kneel to stand, alternating bent knee, bilateral UE support,  x5      Knee/Hip Exercises: Supine   Bridges  Strengthening;Both;2 sets;10 reps          Balance Exercises - 02/24/18 0932      Balance Exercises: Standing   Standing Eyes Opened  Narrow base of support (BOS);Head turns   + upper trunk rotations.   Marching Limitations  on compliant surface, cues for contralateral extremity movement              PT Short Term Goals - 02/14/18 2057      PT SHORT TERM GOAL #1   Title  Pt will be independent with HEP for improved strength, balance, gait.  TARGET 03/14/18    Time  5    Period  Weeks      PT SHORT TERM GOAL #2   Title  Pt will improve DGI score to at least 21/24 for decreased fall risk.    Time  5    Period  Weeks    Status  New      PT SHORT TERM GOAL #3   Title  Pt to improve 6 minute walk test goal by at least 150 ft for improved gait efficiency in community.    Baseline  02/13/18:  866 ft in 6MWT    Time  5    Period  Weeks    Status  Revised      PT SHORT TERM GOAL #4   Title  Pt will verbalize understanding of CVA warning signs and symptoms.    Time  5    Period  Weeks    Status  New        PT Long Term Goals - 02/11/18 1459      PT LONG TERM GOAL #1   Title  Pt will verbalize understanding of fall prevention in the home environment.  TARGET 04/11/18    Time  9    Period  Weeks    Status  New    Target Date  04/11/18      PT LONG TERM GOAL #2   Title  Pt will improve 5x sit<>stand score to less than or equal to 11.4 seconds for improved functional strength and transfer efficiency.    Time  9    Period  Weeks    Status  New    Target Date  04/11/18      PT LONG TERM GOAL #3   Title  Pt will improve gait velocity to at least 2.62 ft/sec for improved gait efficiency and safety.    Time  9    Period  Weeks    Status  New    Target Date  04/11/18      PT LONG TERM GOAL #4   Title  Pt will ambulate at least 1000 ft, indoor and outdoor surfaces, independently no LOB, for improved gait negotiation  outdoor surfaces.    Time  9    Period  Weeks    Status  New    Target Date  04/11/18            Plan - 02/24/18 1320    Clinical Impression Statement  Skilled session focused on standing balance on compliant surface and core and hip strengthening in various developmental positions.  Pt able to follow instructions for strengthening well with min cues and performed standing balance without UE support but required increased cues for coordination activity ( marching with contralateral UE movements).                                                                         Rehab Potential  Good    PT Frequency  Other (comment)   1x/wk in addition to eval; then 3x/wk for 4 weeks, 2x/wk for 4 weeks   PT Treatment/Interventions  ADLs/Self Care Home Management;Gait training;Functional mobility training;Therapeutic activities;Therapeutic exercise;Balance training;Patient/family education;Neuromuscular re-education    PT Next Visit Plan  Continue with strength ( possibly quadruped, 1/2kneel positions) and balance, gait for L step length and L knee control.  SLS activities.    Consulted and Agree with Plan of Care  Patient       Patient will benefit from skilled therapeutic intervention in order to improve the following deficits and impairments:  Abnormal gait, Decreased balance, Decreased mobility, Difficulty walking, Decreased strength  Visit Diagnosis: Other abnormalities of gait and mobility  Muscle weakness (generalized)  Unsteadiness on feet     Problem List Patient Active Problem List   Diagnosis Date Noted  . Acute ischemic stroke ([place]) 01/16/2018  . Acute CVA (cerebrovascular accident) ([place]) 01/16/2018  . GI bleed 07/10/2016  . Morbid obesity due to excess calories ([place]) 05/14/2016  . Dyspnea on exertion 04/29/2016  . Abnormal nuclear stress test   . CAD S/P percutaneous coronary angioplasty 02/08/2016  . INSOMNIA, CHRONIC 02/28/2010  . HEADACHE 02/28/2010  . HEEL PAIN,  RIGHT 11/29/2009  . COUGH DUE TO ACE INHIBITORS 11/29/2009  . ATRIAL ENLARGEMENT, LEFT 03/21/2009  . ANEMIA 12/28/2008  . CARDIAC MURMUR 12/28/2008  . OTHER NONSPECIFIC ABNORMAL SERUM ENZYME LEVELS 12/28/2008  . CERVICAL MUSCLE STRAIN 12/28/2008  . CONSTIPATION 06/01/2008  . POSTMENOPAUSAL STATUS 05/15/2008  . BRONCHITIS, CHRONIC 12/18/2007  . Cigarette smoker 08/19/2007  . DEGENERATIVE DISC DISEASE, LUMBOSACRAL SPINE 08/19/2007  . LOW BACK PAIN SYNDROME 08/19/2007  . UPPER RESPIRATORY INFECTION, VIRAL 03/05/2007  . NEOPLASM, SKIN, UNCERTAIN BEHAVIOR 40/98/1191  . GANGLION CYST, WRIST, LEFT 01/28/2007  . DENTAL PAIN 01/19/2007  . DM (diabetes mellitus), type 2, uncontrolled with complications ([place]) 47/82/9562  . Hyperlipidemia with target LDL less than 70 12/10/2006  . Essential hypertension 12/10/2006  . RHINITIS, ALLERGIC NEC 12/10/2006  . HIP PAIN, RIGHT 12/10/2006    Bjorn Loser, PTA  02/24/18, 1:28 PM [facility] [address] [place], [place], [address] Phone: [phone]  Fax:  [phone]  Name: JOZLYN SCHATZ MRN: [id number] Date of Birth: 05/02/1955

## 2018-02-24 NOTE — Therapy (Signed)
[facility] [address] [place], [place], [address] Phone: [phone]   Fax:  [phone]  Occupational Therapy Treatment  Patient Details  Name: Leah Norton MRN: [id number] Date of Birth: 19-Jul-1955 Referring Provider (OT): Dr. Delfina Redwood   Encounter Date: 02/24/2018  OT End of Session - 02/24/18 1131    Visit Number  4    Number of Visits  25    Date for OT Re-Evaluation  04/12/18    Authorization Type  State BCBS    OT Start Time  0930    OT Stop Time  1015    OT Time Calculation (min)  45 min    Activity Tolerance  Patient tolerated treatment well    Behavior During Therapy  [facility] for tasks assessed/performed   somwwhat impulsive or rushed at times      Past Medical History:  Diagnosis Date  . Anginal pain ([place])   . Arthritis    "right anikle" (02/29/2016)  . CAD in native artery 02/08/2016   Three-vessel coronary artery calcification on CT scan  . Complication of anesthesia    "had reaction w/hives, rash, itching" (02/29/2016)  . Constipation   . Coronary artery disease    12/17 PCI with DESx1 to pLAD EF normal  . Enlarged heart   . GERD (gastroesophageal reflux disease)   . High cholesterol   . Hypertension   . Migraine    hx  . Symptomatic anemia 07/10/2016  . Type II diabetes mellitus ([place])     Past Surgical History:  Procedure Laterality Date  . ABDOMINAL HYSTERECTOMY    . CARDIAC CATHETERIZATION N/A 02/29/2016   Procedure: Left Heart Cath and Coronary Angiography;  Surgeon: Belva Crome, MD;  Location: [facility];  Service: Cardiovascular;  Laterality: N/A;  . CARDIAC CATHETERIZATION N/A 02/29/2016   Procedure: Coronary Stent Intervention;  Surgeon: Belva Crome, MD;  Location: [facility];  Service: Cardiovascular;  Laterality: N/A;  . CARDIAC CATHETERIZATION  2005   "no blockages"  . COLONOSCOPY WITH PROPOFOL N/A 03/15/2014   Procedure: COLONOSCOPY WITH PROPOFOL;   Surgeon: Garlan Fair, MD;  Location: [facility];  Service: Endoscopy;  Laterality: N/A;  . CORONARY ANGIOPLASTY WITH STENT PLACEMENT    . ESOPHAGOGASTRODUODENOSCOPY N/A 07/11/2016   Procedure: ESOPHAGOGASTRODUODENOSCOPY (EGD);  Surgeon: Laurence Spates, MD;  Location: [facility];  Service: Endoscopy;  Laterality: N/A;  . FRACTURE SURGERY    . MULTIPLE TOOTH EXTRACTIONS     teeth removed  . ORIF ANKLE FRACTURE Right 11/12/2012   Procedure: OPEN REDUCTION INTERNAL FIXATION (ORIF) ANKLE FRACTURE;  Surgeon: Marianna Payment, MD;  Location: [facility];  Service: Orthopedics;  Laterality: Right;  Open reduction internal fixation ankle fracture, trimalleolar    There were no vitals filed for this visit.  Subjective Assessment - 02/24/18 0934    Subjective   Pt reports that she feels as though she is getting better. Reports 3/10 R LE pain    Patient Stated Goals  return to work    Currently in Pain?  Yes    Pain Score  3     Pain Location  Leg    Pain Orientation  Right    Pain Descriptors / Indicators  Aching    Pain Type  Chronic pain    Pain Onset  More than a month ago    Pain Frequency  Intermittent    Aggravating Factors   Unsure    Pain Relieving Factors  Unsure    Multiple Pain Sites  No         OT Treatments/Exercises (OP) - 02/24/18 0001      Exercises   Exercises  Shoulder;Hand;Theraputty      Shoulder Exercises: ROM/Strengthening   UBE (Upper Arm Bike)  Bilateral UE's x 8 min for reciprocal UE movement and generalized strengthening. Level 2.5 rotating forward and back   Min VC's/TC's for completetion/focus on task     Theraputty   Theraputty - Flatten  Bilateral hands    Theraputty - Roll  Left & Right hand   10 reps, with vc's   Theraputty - Grip  Bilateral hands    Mod vc's   Theraputty - Pinch  Bilateral hands     Theraputty Hand- Locate Pegs  Palced 10 pegs into putty and removed using pincer technique x10 L then R       Fine Motor Coordination (Hand/Wrist)    Fine Motor Coordination  Small Pegboard    Small Pegboard  Using L hand for coordination: 2 different peg board designs using L hand to remove pegs from container, then place according to pattern. Pt was noted to check/double check and self correct errors.    simple to moderate difficulty. Pt able to complete w/ Min A       OT Education - 02/24/18 1129    Education Details  Review and performance of HEP     Person(s) Educated  Patient    Methods  Explanation;Demonstration;Verbal cues;Tactile cues    Comprehension  Verbalized understanding       OT Short Term Goals - 02/20/18 1023      OT SHORT TERM GOAL #1   Title  I with inital HEP.    Period  Weeks    Status  On-going      OT SHORT TERM GOAL #2   Title  Pt will improve LUE grip strength by 10 lbs for increased LUE functional use during ADLS.    Baseline  RUE 32 lbs, LUE 10 lbs    Time  4    Period  Weeks    Status  On-going      OT SHORT TERM GOAL #3   Title  Pt will demonstrate improved LUE functional use for ADLSas evidenced by decreasing 9 hole peg test score to 30 secs or less.    Baseline  RUE 27.79 secs, LUE 35.28 secs    Time  4    Period  Weeks    Status  On-going      OT SHORT TERM GOAL #4   Title  Pt will demonstrate ability to perform basic cooking at a modified independent level demonstrating good safety awareness.    Time  4    Period  Weeks    Status  On-going      OT SHORT TERM GOAL #5   Title  Pt will demonstrate ability to maintain selective attention to a functional task in a busy environment x 30 mins without redirects.    Time  4    Period  Weeks        OT Long Term Goals - 02/11/18 1752      OT LONG TERM GOAL #1   Title  I with updated HEP.    Time  8    Period  Weeks    Status  New      OT LONG TERM GOAL #2   Title  Pt will demonstrate ability to retrieve a  3 lbs item from overhead shelf with LUE demonstrating good control.    Time  8    Period  Weeks    Status  New      OT  LONG TERM GOAL #3   Title  Pt will demonstrate ability to perform a physical and cognitive task simultaneously with 90% or better accuracy in prep for driving and work activities.    Time  8    Period  Weeks    Status  New      OT LONG TERM GOAL #4   Title  Pt will perfrom simulated work activities at a modified independent level.    Time  8    Period  Weeks    Status  New      OT LONG TERM GOAL #5   Title  Pt will perform mod complex home management at a modified independent level.    Time  8    Period  Weeks    Status  New      Long Term Additional Goals   Additional Long Term Goals  Yes      OT LONG TERM GOAL #6   Title  Pt will demonstrate ability to perfrom mod complex cooking (ie: 2 items at 1 time )modified independently demonstrating good safety awareness.    Time  8    Period  Weeks    Status  New        Plan - 02/24/18 1132    Clinical Impression Statement  Pt is pleased with progress and functional use of L hand & showing gains in coordination.    Occupational Profile and client history currently impacting functional performance  PMH: DM II, HTN, CAD s/p percutaneous angioplasty, hx of GI bleed, tobacco use, Pt was completely I and working full time as [employer], Pt is currently living with family and requires assistance for transportation.    Occupational performance deficits (Please refer to evaluation for details):  ADL's;IADL's;Work;Play;Leisure;Social Participation    Rehab Potential  Good    Current Impairments/barriers affecting progress:  cognitive deficits, impulsivity, decreased awareness, decreased coordination    OT Frequency  3x / week    OT Duration  8 weeks    OT Treatment/Interventions  Self-care/ADL training;Therapeutic exercise;Gait Training;Aquatic Therapy;Moist Heat;Paraffin;Neuromuscular education;Splinting;Patient/family education;Balance training;Therapeutic activities;Functional Mobility Training;Energy  conservation;Fluidtherapy;Ultrasound;Cryotherapy;Contrast Bath;DME and/or AE instruction;Manual Therapy;Passive range of motion;Cognitive remediation/compensation    Plan  NMR L UE, HEP, coordination activities    Clinical Decision Making  Limited treatment options, no task modification necessary    Consulted and Agree with Plan of Care  Patient       Patient will benefit from skilled therapeutic intervention in order to improve the following deficits and impairments:  Abnormal gait, Decreased mobility, Decreased coordination, Decreased range of motion, Decreased strength, Impaired UE functional use, Impaired perceived functional ability, Difficulty walking, Decreased safety awareness, Decreased knowledge of precautions, Decreased balance  Visit Diagnosis: Muscle weakness (generalized)  Frontal lobe and executive function deficit  Other lack of coordination  Other abnormalities of gait and mobility    Problem List Patient Active Problem List   Diagnosis Date Noted  . Acute ischemic stroke ([place]) 01/16/2018  . Acute CVA (cerebrovascular accident) ([place]) 01/16/2018  . GI bleed 07/10/2016  . Morbid obesity due to excess calories ([place]) 05/14/2016  . Dyspnea on exertion 04/29/2016  . Abnormal nuclear stress test   . CAD S/P percutaneous coronary angioplasty 02/08/2016  . INSOMNIA, CHRONIC 02/28/2010  .  HEADACHE 02/28/2010  . HEEL PAIN, RIGHT 11/29/2009  . COUGH DUE TO ACE INHIBITORS 11/29/2009  . ATRIAL ENLARGEMENT, LEFT 03/21/2009  . ANEMIA 12/28/2008  . CARDIAC MURMUR 12/28/2008  . OTHER NONSPECIFIC ABNORMAL SERUM ENZYME LEVELS 12/28/2008  . CERVICAL MUSCLE STRAIN 12/28/2008  . CONSTIPATION 06/01/2008  . POSTMENOPAUSAL STATUS 05/15/2008  . BRONCHITIS, CHRONIC 12/18/2007  . Cigarette smoker 08/19/2007  . DEGENERATIVE DISC DISEASE, LUMBOSACRAL SPINE 08/19/2007  . LOW BACK PAIN SYNDROME 08/19/2007  . UPPER RESPIRATORY INFECTION, VIRAL 03/05/2007  . NEOPLASM, SKIN, UNCERTAIN  BEHAVIOR 95/28/4132  . GANGLION CYST, WRIST, LEFT 01/28/2007  . DENTAL PAIN 01/19/2007  . DM (diabetes mellitus), type 2, uncontrolled with complications ([place]) 44/03/270  . Hyperlipidemia with target LDL less than 70 12/10/2006  . Essential hypertension 12/10/2006  . RHINITIS, ALLERGIC NEC 12/10/2006  . HIP PAIN, RIGHT 12/10/2006    Percell Miller Ardath Sax, OTR/L 02/24/2018, 11:36 AM  [facility] [address] [place], [place], [address] Phone: [phone]   Fax:  [phone]  Name: Leah Norton MRN: [id number] Date of Birth: Oct 19, 1955

## 2018-02-25 ENCOUNTER — Ambulatory Visit: Payer: BC Managed Care – PPO | Admitting: Occupational Therapy

## 2018-02-25 ENCOUNTER — Encounter: Payer: Self-pay | Admitting: Physical Therapy

## 2018-02-25 ENCOUNTER — Ambulatory Visit: Payer: BC Managed Care – PPO | Admitting: Physical Therapy

## 2018-02-25 ENCOUNTER — Encounter: Payer: Self-pay | Admitting: Occupational Therapy

## 2018-02-25 ENCOUNTER — Ambulatory Visit: Payer: BC Managed Care – PPO | Admitting: Speech Pathology

## 2018-02-25 DIAGNOSIS — R2689 Other abnormalities of gait and mobility: Secondary | ICD-10-CM

## 2018-02-25 DIAGNOSIS — R278 Other lack of coordination: Secondary | ICD-10-CM

## 2018-02-25 DIAGNOSIS — R2681 Unsteadiness on feet: Secondary | ICD-10-CM

## 2018-02-25 DIAGNOSIS — R41844 Frontal lobe and executive function deficit: Secondary | ICD-10-CM

## 2018-02-25 DIAGNOSIS — M6281 Muscle weakness (generalized): Secondary | ICD-10-CM

## 2018-02-25 DIAGNOSIS — R471 Dysarthria and anarthria: Secondary | ICD-10-CM

## 2018-02-25 DIAGNOSIS — R41841 Cognitive communication deficit: Secondary | ICD-10-CM

## 2018-02-25 NOTE — Therapy (Signed)
[facility] [address] [place], [place], [address] Phone: [phone]   Fax:  [phone]  Occupational Therapy Treatment  Patient Details  Name: Leah Norton MRN: [id number] Date of Birth: [birth year] Referring Provider (OT): Dr. Delfina Redwood   Encounter Date: 02/25/2018  OT End of Session - 02/25/18 0933    Visit Number  5    Number of Visits  25    Date for OT Re-Evaluation  04/12/18    Authorization Type  State BCBS    OT Start Time  [phone]    OT Stop Time  1015    OT Time Calculation (min)  42 min       Past Medical History:  Diagnosis Date  . Anginal pain ([facility])   . Arthritis    "right anikle" (02/29/2016)  . CAD in native artery 02/08/2016   Three-vessel coronary artery calcification on CT scan  . Complication of anesthesia    "had reaction w/hives, rash, itching" (02/29/2016)  . Constipation   . Coronary artery disease    12/17 PCI with DESx1 to pLAD EF normal  . Enlarged heart   . GERD (gastroesophageal reflux disease)   . High cholesterol   . Hypertension   . Migraine    hx  . Symptomatic anemia 07/10/2016  . Type II diabetes mellitus ([facility])     Past Surgical History:  Procedure Laterality Date  . ABDOMINAL HYSTERECTOMY    . CARDIAC CATHETERIZATION N/A 02/29/2016   Procedure: Left Heart Cath and Coronary Angiography;  Surgeon: Belva Crome, MD;  Location: [facility];  Service: Cardiovascular;  Laterality: N/A;  . CARDIAC CATHETERIZATION N/A 02/29/2016   Procedure: Coronary Stent Intervention;  Surgeon: Belva Crome, MD;  Location: [facility];  Service: Cardiovascular;  Laterality: N/A;  . CARDIAC CATHETERIZATION  2005   "no blockages"  . COLONOSCOPY WITH PROPOFOL N/A 03/15/2014   Procedure: COLONOSCOPY WITH PROPOFOL;  Surgeon: Garlan Fair, MD;  Location: [facility];  Service: Endoscopy;  Laterality: N/A;  . CORONARY ANGIOPLASTY WITH STENT PLACEMENT    .  ESOPHAGOGASTRODUODENOSCOPY N/A 07/11/2016   Procedure: ESOPHAGOGASTRODUODENOSCOPY (EGD);  Surgeon: Laurence Spates, MD;  Location: [facility];  Service: Endoscopy;  Laterality: N/A;  . FRACTURE SURGERY    . MULTIPLE TOOTH EXTRACTIONS     teeth removed  . ORIF ANKLE FRACTURE Right 11/12/2012   Procedure: OPEN REDUCTION INTERNAL FIXATION (ORIF) ANKLE FRACTURE;  Surgeon: Marianna Payment, MD;  Location: [facility];  Service: Orthopedics;  Laterality: Right;  Open reduction internal fixation ankle fracture, trimalleolar    There were no vitals filed for this visit.  Subjective Assessment - 02/25/18 0933    Patient Stated Goals  return to work    Currently in Pain?  No/denies               Treatment: Pt was instructed in red theraband HEP, see pt instructions, min-mod v.c for performance-10-15 reps each Placing grooved pegs into pegboard with LUE for increased fine motor coordination and in hand manipulation, min difficulty/ v.c Gripper set at level 2 for sustained grip, several rest breaks requied due to fatigue, min v.c to avoid compensations Quadraped for cat and cow then lifting alternate UE/ LE, mod difficulty/ facilitation, task discontinued due to left wrist discomfort. Arm bike x 5 mins level 3 min v.c for hand positioning. Removing graded clothespins from vertical antennae with LUE for sustained pinch, min v.c  OT Education - 02/25/18 1113    Education Details  Red theraband HEP for shoulder see pt instructions    Person(s) Educated  Patient    Methods  Explanation;Demonstration;Verbal cues;Tactile cues    Comprehension  Verbalized understanding;Returned demonstration;Verbal cues required       OT Short Term Goals - 02/25/18 1014      OT SHORT TERM GOAL #1   Title  I with inital HEP.    Period  Weeks    Status  On-going      OT SHORT TERM GOAL #2   Title  Pt will improve LUE grip strength by 10 lbs for increased LUE functional use during ADLS.     Baseline  RUE 32 lbs, LUE 10 lbs    Time  4    Period  Weeks    Status  On-going      OT SHORT TERM GOAL #3   Title  Pt will demonstrate improved LUE functional use for ADLSas evidenced by decreasing 9 hole peg test score to 30 secs or less.    Baseline  RUE 27.79 secs, LUE 35.28 secs    Time  4    Period  Weeks    Status  On-going      OT SHORT TERM GOAL #4   Title  Pt will demonstrate ability to perform basic cooking at a modified independent level demonstrating good safety awareness.    Time  4    Period  Weeks    Status  On-going      OT SHORT TERM GOAL #5   Title  Pt will demonstrate ability to maintain selective attention to a functional task in a busy environment x 30 mins without redirects.    Time  4    Period  Weeks        OT Long Term Goals - 02/11/18 1752      OT LONG TERM GOAL #1   Title  I with updated HEP.    Time  8    Period  Weeks    Status  New      OT LONG TERM GOAL #2   Title  Pt will demonstrate ability to retrieve a 3 lbs item from overhead shelf with LUE demonstrating good control.    Time  8    Period  Weeks    Status  New      OT LONG TERM GOAL #3   Title  Pt will demonstrate ability to perform a physical and cognitive task simultaneously with 90% or better accuracy in prep for driving and work activities.    Time  8    Period  Weeks    Status  New      OT LONG TERM GOAL #4   Title  Pt will perfrom simulated work activities at a modified independent level.    Time  8    Period  Weeks    Status  New      OT LONG TERM GOAL #5   Title  Pt will perform mod complex home management at a modified independent level.    Time  8    Period  Weeks    Status  New      Long Term Additional Goals   Additional Long Term Goals  Yes      OT LONG TERM GOAL #6   Title  Pt will demonstrate ability to perfrom mod complex cooking (ie: 2 items at 1 time )modified independently demonstrating  good safety awareness.    Time  8    Period  Weeks    Status   New            Plan - 02/25/18 1014    Clinical Impression Statement  Pt is progressing towards goals with improving LUE strength and functional use.    Occupational Profile and client history currently impacting functional performance  PMH: DM II, HTN, CAD s/p percutaneous angioplasty, hx of GI bleed, tobacco use, Pt was completely I and working full time as [employer], Pt is currently living with family and requires assistance for transportation.    Occupational performance deficits (Please refer to evaluation for details):  ADL's;IADL's;Work;Play;Leisure;Social Participation    Rehab Potential  Good    Current Impairments/barriers affecting progress:  cognitive deficits, impulsivity, decreased awareness, decreased coordination    OT Frequency  3x / week    OT Duration  8 weeks    OT Treatment/Interventions  Self-care/ADL training;Therapeutic exercise;Gait Training;Aquatic Therapy;Moist Heat;Paraffin;Neuromuscular education;Splinting;Patient/family education;Balance training;Therapeutic activities;Functional Mobility Training;Energy conservation;Fluidtherapy;Ultrasound;Cryotherapy;Contrast Bath;DME and/or AE instruction;Manual Therapy;Passive range of motion;Cognitive remediation/compensation    Plan  NMR L UE, HEP, coordination activities    OT Home Exercise Plan  issued yellow putty HEP, issued red theraband for shoulder    Consulted and Agree with Plan of Care  Patient       Patient will benefit from skilled therapeutic intervention in order to improve the following deficits and impairments:  Abnormal gait, Decreased mobility, Decreased coordination, Decreased range of motion, Decreased strength, Impaired UE functional use, Impaired perceived functional ability, Difficulty walking, Decreased safety awareness, Decreased knowledge of precautions, Decreased balance  Visit Diagnosis: Muscle weakness (generalized)  Frontal lobe and executive function deficit  Other  lack of coordination  Other abnormalities of gait and mobility    Problem List Patient Active Problem List   Diagnosis Date Noted  . Acute ischemic stroke ([facility]) 01/16/2018  . Acute CVA (cerebrovascular accident) ([facility]) 01/16/2018  . GI bleed 07/10/2016  . Morbid obesity due to excess calories ([facility] [facility]) 05/14/2016  . Dyspnea on exertion 04/29/2016  . Abnormal nuclear stress test   . CAD S/P percutaneous coronary angioplasty 02/08/2016  . INSOMNIA, CHRONIC 02/28/2010  . HEADACHE 02/28/2010  . HEEL PAIN, RIGHT 11/29/2009  . COUGH DUE TO ACE INHIBITORS 11/29/2009  . ATRIAL ENLARGEMENT, LEFT 03/21/2009  . ANEMIA 12/28/2008  . CARDIAC MURMUR 12/28/2008  . OTHER NONSPECIFIC ABNORMAL SERUM ENZYME LEVELS 12/28/2008  . CERVICAL MUSCLE STRAIN 12/28/2008  . CONSTIPATION 06/01/2008  . POSTMENOPAUSAL STATUS 05/15/2008  . BRONCHITIS, CHRONIC 12/18/2007  . Cigarette smoker 08/19/2007  . DEGENERATIVE DISC DISEASE, LUMBOSACRAL SPINE 08/19/2007  . LOW BACK PAIN SYNDROME 08/19/2007  . UPPER RESPIRATORY INFECTION, VIRAL 03/05/2007  . NEOPLASM, SKIN, UNCERTAIN BEHAVIOR 28/76/8115  . GANGLION CYST, WRIST, LEFT 01/28/2007  . DENTAL PAIN 01/19/2007  . DM (diabetes mellitus), type 2, uncontrolled with complications ([facility]) 72/62/0355  . Hyperlipidemia with target LDL less than 70 12/10/2006  . Essential hypertension 12/10/2006  . RHINITIS, ALLERGIC NEC 12/10/2006  . HIP PAIN, RIGHT 12/10/2006    , 02/25/2018, 11:13 AM  [facility] [address] [place], [place], [address] Phone: [phone]   Fax:  [phone]  Name: QUADASIA NEWSHAM MRN: [id number] Date of Birth: April 12, 1955
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
## 2018-02-25 NOTE — Patient Instructions (Signed)
  Strengthening: Resisted Flexion   Hold tubing with __left___ arm(s) at side. Pull forward and up. Move shoulder through pain-free range of motion. Repeat __10__ times per set.  Do _1-2_ sessions per day , every other day   Strengthening: Resisted Extension   Hold tubing in __left___ hand(s), arm forward. Pull arm back, elbow straight. Repeat _10___ times per set. Do _1-2___ sessions per day, every other day.   Resisted Horizontal Abduction: Bilateral   Sit or stand, tubing in both hands, arms out in front. Keeping arms straight, pinch shoulder blades together and stretch arms out. Repeat _10___ times per set. Do _1-2___ sessions per day, every other day.

## 2018-02-25 NOTE — Progress Notes (Signed)
[facility] [address] [place]. [place] [address] [phone]       OFFICE FOLLOW UP NOTE  Ms. MARCENA Norton Date of Birth:  1955/09/19 Medical Record Number:  [id number]   Reason for Referral:  hospital stroke follow up  CHIEF COMPLAINT:  Chief Complaint  Patient presents with  . Follow-up    Hospital stroke follow up room 9 pt saw Dr. 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 History obtained from patient, sister and chart review. Reviewed all radiology images and labs personally.  Ms. [name] is a 62 y.o. female with history of CAD s/p DES to pLAD in 12/17, hypercholesterolemia, HTN, DM2 and smoking   who presented with slurred speech, balance problems, AMS, and left sided weakness. She did not receive IV t-PA due to late presentation.   CT head reviewed and negative for acute infarct.  MRI brain reviewed and showed subcentimeter acute early subacute infarct within the right pons.  CTA head and neck showed a 50 to 75% stenosis proximal right M1 MCA which was noncontributory with respect to the acute ischemia.  2D echo showed an EF of 40 to 45%.  LDL 77 and A1c 6.6.  Patient was on aspirin 81 mg PTA and recommended DAPT for 3 weeks on Plavix alone.  Etiology of infarct likely due to small vessel disease source.  Recommended to continue to follow with PCP in regards to HTN, HLD and DM management along with smoking cessation counseling provided.  Patient discharged home in stable condition with recommendations of outpatient PT.  Patient is being seen today for hospital follow-up and is accompanied by her sister.  Patient continues to have left hemiparesis but has been improving along with intermittent balance difficulties and is currently participating in PT/OT.  She also continues to have dysarthria with occasional aphasia  and cognitive slowing but continues to participate in speech therapy.  She has since returned home where she is able to complete all her ADLs and some assistance with IADLs from her sister.  She has continued on aspirin and Plavix without side effects of bleeding or bruising.  Continues on Crestor without side effects of myalgias.  Blood pressure today 159/88 and patient does monitor at home with typical SBP 120-130.  She has completely quit smoking since hospital discharge.  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  She is currently receiving FMLA which was provided by PCP.  She does endorse snoring with insomnia and daytime fatigue which has been present prior to her stroke but does endorse worsening fatigue since hospital discharge.  She denies prior sleep study.  She is also been having left side ear pains which was also present prior to her stroke and has been seen by ENT and per patient, she was told everything was satisfactory.  She does endorse neck pain on left side with some radiating pain up to her ear and a discomfort surrounding her ear up into her head occasionally causing headaches.  No further concerns at this time.  Denies new or worsening stroke/TIA symptoms.   ROS:   14 system review of systems performed and negative with exception of weight gain, joint pain, joint swelling, aching muscles, memory loss, slurred speech and not enough sleep  PMH:  Past Medical History:  Diagnosis Date  . Anginal pain ([place])   . Arthritis    "right anikle" (02/29/2016)  . CAD in native artery 02/08/2016   Three-vessel coronary artery calcification on CT scan  . Complication of anesthesia    "had reaction w/hives, rash, itching" (02/29/2016)  . Constipation   . Coronary artery disease    12/17 PCI with DESx1 to pLAD EF normal  . Enlarged heart   . GERD (gastroesophageal reflux disease)   . High cholesterol   . Hypertension   . Migraine      hx  . Stroke ([place])   . Symptomatic anemia 07/10/2016  . Type II diabetes mellitus ([facility])     PSH:  Past Surgical History:  Procedure Laterality Date  . ABDOMINAL HYSTERECTOMY    . CARDIAC CATHETERIZATION N/A 02/29/2016   Procedure: Left Heart Cath and Coronary Angiography;  Surgeon: Belva Crome, MD;  Location: [place] [facility];  Service: Cardiovascular;  Laterality: N/A;  . CARDIAC CATHETERIZATION N/A 02/29/2016   Procedure: Coronary Stent Intervention;  Surgeon: Belva Crome, MD;  Location: [facility];  Service: Cardiovascular;  Laterality: N/A;  . CARDIAC CATHETERIZATION  2005   "no blockages"  . COLONOSCOPY WITH PROPOFOL N/A 03/15/2014   Procedure: COLONOSCOPY WITH PROPOFOL;  Surgeon: Garlan Fair, MD;  Location: [facility];  Service: Endoscopy;  Laterality: N/A;  . CORONARY ANGIOPLASTY WITH STENT PLACEMENT    . ESOPHAGOGASTRODUODENOSCOPY N/A 07/11/2016   Procedure: ESOPHAGOGASTRODUODENOSCOPY (EGD);  Surgeon: Laurence Spates, MD;  Location: [facility];  Service: Endoscopy;  Laterality: N/A;  . FRACTURE SURGERY    . MULTIPLE TOOTH EXTRACTIONS     teeth removed  . ORIF ANKLE FRACTURE Right 11/12/2012   Procedure: OPEN REDUCTION INTERNAL FIXATION (ORIF) ANKLE FRACTURE;  Surgeon: Marianna Payment, MD;  Location: [facility];  Service: Orthopedics;  Laterality: Right;  Open reduction internal fixation ankle fracture, trimalleolar    Social History:  Social History   Socioeconomic History  . Marital status: Single    Spouse name: Not on file  . Number of children: Not on file  . Years of education: Not on file  . Highest education level: Not on file  Occupational History  . Not on file  Social Needs  . Financial resource strain: Not on file  . Food insecurity:    Worry: Not on file    Inability: Not on file  . Transportation needs:    Medical: Not on file    Non-medical: Not on file  Tobacco Use  . Smoking status: Former Smoker    Packs/day: 0.50    Years:  42.00    Pack years: 21.00    Types: Cigarettes    Last attempt to quit: 01/15/2018    Years since quitting: 0.1  . Smokeless tobacco: Never Used  Substance and Sexual Activity  . Alcohol use: Yes    Comment: occasionally  . Drug use: No  . Sexual activity: Not on file  Lifestyle  . Physical activity:    Days per week: Not on file    Minutes per session: Not on file  . Stress: Not on file  Relationships  . Social connections:    Talks on phone: Not on file    Gets together: Not on file    Attends religious service: Not on file    Active member of club or organization: Not on file    Attends meetings of clubs or organizations: Not on file  Relationship status: Not on file  . Intimate partner violence:    Fear of current or ex partner: Not on file    Emotionally abused: Not on file    Physically abused: Not on file    Forced sexual activity: Not on file  Other Topics Concern  . Not on file  Social History Narrative  . Not on file    Family History:  Family History  Problem Relation Age of Onset  . Hypertension Mother     Medications:   Current Outpatient Medications on File Prior to Visit  Medication Sig Dispense Refill  . aspirin 81 MG chewable tablet Chew 81 mg by mouth daily.    Marland Kitchen atenolol (TENORMIN) 50 MG tablet Take 1 tablet (50 mg total) by mouth daily. 180 tablet 3  . buPROPion (WELLBUTRIN SR) 150 MG 12 hr tablet Take 150 mg by mouth 2 (two) times daily.     . Cholecalciferol (VITAMIN D3) 1000 units CAPS Take 2,000 Units by mouth daily.    . clopidogrel (PLAVIX) 75 MG tablet Take 1 tablet (75 mg total) by mouth daily. 90 tablet 3  . furosemide (LASIX) 20 MG tablet Take 1 tablet (20 mg total) by mouth daily as needed. 30 tablet 3  . losartan (COZAAR) 100 MG tablet Take 1 tablet (100 mg total) by mouth daily. 90 tablet 3  . nitroGLYCERIN (NITROSTAT) 0.4 MG SL tablet Place 0.4 mg under the tongue every 5 (five) minutes as needed for chest pain (Call 911 at 3rd  dose within 15 minutes.).    Marland Kitchen pantoprazole (PROTONIX) 40 MG tablet Take 40 mg by mouth daily.    . rosuvastatin (CRESTOR) 10 MG tablet Take 10 mg by mouth daily.    . sitaGLIPtin-metformin (JANUMET) 50-1000 MG per tablet Take 1 tablet by mouth 2 (two) times daily with a meal.     No current facility-administered medications on file prior to visit.     Allergies:   Allergies  Allergen Reactions  . Codeine Rash  . Erythromycin Rash  . Penicillins Rash    Has patient had a PCN reaction causing immediate rash, facial/tongue/throat swelling, SOB or lightheadedness with hypotension: Yes Has patient had a PCN reaction causing severe rash involving mucus membranes or skin necrosis: No Has patient had a PCN reaction that required hospitalization: No Has patient had a PCN reaction occurring within the last 10 years: Yes If all of the above answers are "NO", then may proceed with Cephalosporin use.   . Sulfonamide Derivatives Rash  . Vicodin [Hydrocodone-Acetaminophen] Rash     Physical Exam  Vitals:   02/26/18 0851  BP: (!) 159/85  Pulse: 81  Weight: 185 lb (83.9 kg)  Height: 5\' 4"  (1.626 m)   Body mass index is 31.76 kg/m. No exam data present  General: well developed, well nourished, pleasant middle-aged African-American female, seated, in no evident distress Head: head normocephalic and atraumatic.   Neck: supple with no carotid or supraclavicular bruits Cardiovascular: regular rate and rhythm, no murmurs Musculoskeletal: no deformity Skin:  no rash/petichiae Vascular:  Normal pulses all extremities  Neurologic Exam Mental Status: Awake and fully alert.  Mild to moderate dysarthria with occasional expressive aphasia.  Oriented to place and time. Recent and remote memory intact. Attention span, concentration and fund of knowledge appropriate. Mood and affect appropriate.  Cranial Nerves: Fundoscopic exam reveals sharp disc margins. Pupils equal, briskly reactive to light.  Extraocular movements full without nystagmus. Visual fields full to confrontation. Hearing intact.  Facial sensation intact. Face, tongue, palate moves normally and symmetrically.  Motor: Normal bulk and tone.  LUE: 4-/5 greater distally; LLE: 4+/5 greatest in hip flexor Sensory.: intact to touch , pinprick , position and vibratory sensation.  Coordination: Rapid alternating movements normal in all extremities. Finger-to-nose and heel-to-shin performed accurately bilaterally.  Orbits right arm over left arm.  Decreased left hand finger dexterity. Gait and Station: Arises from chair without difficulty. Stance is normal. Gait demonstrates normal stride length and balance. Able to heel, toe and tandem walk with difficulty.  Romberg negative. Reflexes: 1+ and symmetric. Toes downgoing.    NIHSS  2 Modified Rankin  2    Diagnostic Data (Labs, Imaging, Testing)  Ct Angio Head W Or Wo Contrast Ct Angio Neck W Or Wo Contrast 01/16/2018 IMPRESSION:  RIGHT paramedian brainstem infarct is not visible on pre or postcontrast CT imaging. No visible hemorrhagic transformation. No posterior circulation/basilar artery stenosis or dissection is observed. Minor calcific atheromatous change of the BILATERAL cavernous carotid arteries. 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   Mr Brain Wo Contrast 01/16/2018 IMPRESSION:  1. Subcentimeter acute or early subacute infarct within the right pons, in keeping with reported left-sided weakness.  2. No acute hemorrhage, mass effect or hydrocephalus.   2D echocardiogram 01/16/2018 Study Conclusions - Left ventricle: The cavity size was mildly dilated. Systolic   function was mildly to moderately reduced. The estimated ejection   fraction was in the range of 40% to 45%. Diffuse hypokinesis.   Moderate hypokinesis of the inferior  myocardium. Features are   consistent with a pseudonormal left ventricular filling pattern,   with concomitant abnormal relaxation and increased filling   pressure (grade 2 diastolic dysfunction). - Mitral valve: There was mild regurgitation. - Left atrium: The atrium was moderately dilated. - Atrial septum: No defect or patent foramen ovale was identified. Impressions: - Reduced EF with global mild hypokinesis and moderate hypokinesis   inferiorly. Grade 2 diastolic dysfunction.    ASSESSMENT: Leah Norton is a 62 y.o. year old female here with right pontine infarct on 01/16/2018 secondary to small vessel disease. Vascular risk factors include HTN, HLD, DM and tobacco use.  Patient is being seen today for hospital follow-up and does continue to have stroke deficits including mild left hemiparesis, balance difficulties, dysarthria with occasional expressive aphasia and cognitive slowing.    PLAN:  1. Right pons infarct: Continue clopidogrel 75 mg daily  and Lipitor for secondary stroke prevention.  Advised patient to discontinue aspirin at this time and continue on Plavix as greater than 3-week DAPT completed.  Due to continued stroke deficits, it was recommended for patient not to return to work at this time as her job is highly physical and involves quick thinking, need of full strength and need of speech clarity.  She will follow-up in 3 months time and this recommendation will be reassessed at that time.  Recommended to continue PT/OT/ST for continued improvement.  Maintain strict control of hypertension with blood pressure goal below 130/90, diabetes with hemoglobin A1c goal below 6.5% and cholesterol with LDL cholesterol (bad cholesterol) goal below 70 mg/dL.  I also advised the patient to eat a healthy diet with plenty of whole grains, cereals, fruits and vegetables, exercise regularly with at least 30 minutes of continuous activity daily and maintain ideal body weight.  2. HTN:  Advised to continue current treatment  regimen.  Today's BP 159/88.  Advised to continue to monitor at home along with continued follow-up with PCP for management 3. HLD: Advised to continue current treatment regimen along with continued follow-up with PCP for future prescribing and monitoring of lipid panel 4. DMII: Advised to continue to monitor glucose levels at home along with continued follow-up with PCP for management and monitoring 5. Possible sleep apnea: Referred patient to [facility] to undergo sleep consult with possible sleep study to rule out OSA.  Risk factors reviewed with patient of untreated OSA 6. Ear pain/headaches: Could possibly be related to occipital nerve pain which was reviewed with patient.  Reviewed trial of treatment with gabapentin or possibly nerve block injections for relief of this pain as her symptoms appear to be related to cervicalgia possibly causing occipital nerve pain.  She is requesting to obtain second opinion by ENT specialist prior to undergoing any possible nerve pain treatment.    Follow up in 3 months or call earlier if needed   Greater than 50% of time during this 45 minute visit was spent on counseling, explanation of diagnosis of right pons infarct, reviewing risk factor management of HLD, HTN, DM and tobacco use, answered numerous questions regarding etiology of infarct and risk factor management with preventing future stroke, planning of further management along with potential future management, and discussion with patient and family answering all questions.    Venancio Poisson, AGNP-BC  [facility] [address] [place], [place] [address]  Phone [phone] Fax [phone] Note: This document was prepared with digital dictation and possible smart phrase technology. Any transcriptional errors that result from this process are unintentional.

## 2018-02-25 NOTE — Therapy (Signed)
[facility] [address] [place], [place], [address] Phone: [phone]   Fax:  [phone]  Physical Therapy Treatment  Patient Details  Name: Leah Norton MRN: [id number] Date of Birth: 1955-03-18 Referring Provider (PT): Seward Carol, MD   Encounter Date: 02/25/2018  PT End of Session - 02/25/18 1406    Visit Number  7    Number of Visits  23    Date for PT Re-Evaluation  05/12/18    Authorization Type  State BCBS    PT Start Time  1018    PT Stop Time  1100    PT Time Calculation (min)  42 min    Activity Tolerance  Patient tolerated treatment well;Other (comment)   pt needed frequent seated rest breaks   Behavior During Therapy  WFL for tasks assessed/performed       Past Medical History:  Diagnosis Date  . Anginal pain ([place])   . Arthritis    "right anikle" (02/29/2016)  . CAD in native artery 02/08/2016   Three-vessel coronary artery calcification on CT scan  . Complication of anesthesia    "had reaction w/hives, rash, itching" (02/29/2016)  . Constipation   . Coronary artery disease    12/17 PCI with DESx1 to pLAD EF normal  . Enlarged heart   . GERD (gastroesophageal reflux disease)   . High cholesterol   . Hypertension   . Migraine    hx  . Symptomatic anemia 07/10/2016  . Type II diabetes mellitus ([place])     Past Surgical History:  Procedure Laterality Date  . ABDOMINAL HYSTERECTOMY    . CARDIAC CATHETERIZATION N/A 02/29/2016   Procedure: Left Heart Cath and Coronary Angiography;  Surgeon: Belva Crome, MD;  Location: [facility];  Service: Cardiovascular;  Laterality: N/A;  . CARDIAC CATHETERIZATION N/A 02/29/2016   Procedure: Coronary Stent Intervention;  Surgeon: Belva Crome, MD;  Location: [facility];  Service: Cardiovascular;  Laterality: N/A;  . CARDIAC CATHETERIZATION  2005   "no blockages"  . COLONOSCOPY WITH PROPOFOL N/A 03/15/2014   Procedure: COLONOSCOPY  WITH PROPOFOL;  Surgeon: Garlan Fair, MD;  Location: [facility];  Service: Endoscopy;  Laterality: N/A;  . CORONARY ANGIOPLASTY WITH STENT PLACEMENT    . ESOPHAGOGASTRODUODENOSCOPY N/A 07/11/2016   Procedure: ESOPHAGOGASTRODUODENOSCOPY (EGD);  Surgeon: Laurence Spates, MD;  Location: [facility];  Service: Endoscopy;  Laterality: N/A;  . FRACTURE SURGERY    . MULTIPLE TOOTH EXTRACTIONS     teeth removed  . ORIF ANKLE FRACTURE Right 11/12/2012   Procedure: OPEN REDUCTION INTERNAL FIXATION (ORIF) ANKLE FRACTURE;  Surgeon: Marianna Payment, MD;  Location: [facility];  Service: Orthopedics;  Laterality: Right;  Open reduction internal fixation ankle fracture, trimalleolar    There were no vitals filed for this visit.  Subjective Assessment - 02/25/18 1023    Subjective  Denies changes.    Pertinent History  PMH includes essential HTN, hyperlipidemia, DM, DDD lumbo-sacral spine; Hx of CVA 01/15/18    Patient Stated Goals  Pt's goals for physical therapy are to do whatever I can to return to normal.    Currently in Pain?  No/denies                       [facility] Adult PT Treatment/Exercise - 02/25/18 0001      Transfers   Transfers  Sit to Stand;Stand to Sit    Sit to Stand  5: Supervision;Without  upper extremity assist;From chair/3-in-1    Sit to Stand Details  --    Stand to Sit  5: Supervision;Without upper extremity assist;To chair/3-in-1    Number of Reps  10 reps      Ambulation/Gait   Ambulation/Gait  Yes    Ambulation/Gait Assistance  5: Supervision    Ambulation Distance (Feet)  150 Feet   200 x 2;100 x 1   Assistive device  None    Gait Pattern  Step-through pattern;Decreased step length - left;Decreased trunk rotation;Decreased arm swing - left;Poor foot clearance - left   shuffles on L at times;poor heel strike on L   Ambulation Surface  Level;Indoor    Gait Comments  Treadmill x 4 minutes 1.4 working on increasing L step length and bil foot clearance.  Tends to  rotate L LE and use momentum to swing thru LLE.      Knee/Hip Exercises: Seated   Long Arc Quad  Left;2 sets;15 reps;Weights   4 lb   Long Arc Quad Weight  4 lbs.    Marching  Left;2 sets;15 reps;Weights    Marching Weights  4 lbs.          Balance Exercises - 02/25/18 1403      Balance Exercises: Standing   SLS  Eyes open;Intermittent upper extremity support;Other reps (comment)   15 reps tap to step then 15 reps tap to 1st and 2nd step   Other Standing Exercises  cone taps, double taps and tipping/uprighting cones with intermittent assist.   Needed min cues to stay on task.            PT Short Term Goals - 02/14/18 2057      PT SHORT TERM GOAL #1   Title  Pt will be independent with HEP for improved strength, balance, gait.  TARGET 03/14/18    Time  5    Period  Weeks      PT SHORT TERM GOAL #2   Title  Pt will improve DGI score to at least 21/24 for decreased fall risk.    Time  5    Period  Weeks    Status  New      PT SHORT TERM GOAL #3   Title  Pt to improve 6 minute walk test goal by at least 150 ft for improved gait efficiency in community.    Baseline  02/13/18:  866 ft in 6MWT    Time  5    Period  Weeks    Status  Revised      PT SHORT TERM GOAL #4   Title  Pt will verbalize understanding of CVA warning signs and symptoms.    Time  5    Period  Weeks    Status  New        PT Long Term Goals - 02/11/18 1459      PT LONG TERM GOAL #1   Title  Pt will verbalize understanding of fall prevention in the home environment.  TARGET 04/11/18    Time  9    Period  Weeks    Status  New    Target Date  04/11/18      PT LONG TERM GOAL #2   Title  Pt will improve 5x sit<>stand score to less than or equal to 11.4 seconds for improved functional strength and transfer efficiency.    Time  9    Period  Weeks    Status  New  Target Date  04/11/18      PT LONG TERM GOAL #3   Title  Pt will improve gait velocity to at least 2.62 ft/sec for improved gait  efficiency and safety.    Time  9    Period  Weeks    Status  New    Target Date  04/11/18      PT LONG TERM GOAL #4   Title  Pt will ambulate at least 1000 ft, indoor and outdoor surfaces, independently no LOB, for improved gait negotiation outdoor surfaces.    Time  9    Period  Weeks    Status  New    Target Date  04/11/18            Plan - 02/25/18 1407    Clinical Impression Statement  Skilled session focused on gait on ground and treadmill working on increased cadence and L foot clearance.  Pt's ability to SLS has improved.  continue PT per POC.    Rehab Potential  Good    PT Frequency  Other (comment)   1x/wk in addition to eval; then 3x/wk for 4 weeks, 2x/wk for 4 weeks   PT Treatment/Interventions  ADLs/Self Care Home Management;Gait training;Functional mobility training;Therapeutic activities;Therapeutic exercise;Balance training;Patient/family education;Neuromuscular re-education    PT Next Visit Plan  Try seated activities on blue therapy ball.  gait for L step length and L knee control.  SLS activities.    Consulted and Agree with Plan of Care  Patient       Patient will benefit from skilled therapeutic intervention in order to improve the following deficits and impairments:  Abnormal gait, Decreased balance, Decreased mobility, Difficulty walking, Decreased strength  Visit Diagnosis: Other abnormalities of gait and mobility  Unsteadiness on feet  Muscle weakness (generalized)  Other lack of coordination     Problem List Patient Active Problem List   Diagnosis Date Noted  . Acute ischemic stroke ([place]) 01/16/2018  . Acute CVA (cerebrovascular accident) ([place]) 01/16/2018  . GI bleed 07/10/2016  . Morbid obesity due to excess calories ([place]) 05/14/2016  . Dyspnea on exertion 04/29/2016  . Abnormal nuclear stress test   . CAD S/P percutaneous coronary angioplasty 02/08/2016  . INSOMNIA, CHRONIC 02/28/2010  . HEADACHE 02/28/2010  . HEEL PAIN, RIGHT  11/29/2009  . COUGH DUE TO ACE INHIBITORS 11/29/2009  . ATRIAL ENLARGEMENT, LEFT 03/21/2009  . ANEMIA 12/28/2008  . CARDIAC MURMUR 12/28/2008  . OTHER NONSPECIFIC ABNORMAL SERUM ENZYME LEVELS 12/28/2008  . CERVICAL MUSCLE STRAIN 12/28/2008  . CONSTIPATION 06/01/2008  . POSTMENOPAUSAL STATUS 05/15/2008  . BRONCHITIS, CHRONIC 12/18/2007  . Cigarette smoker 08/19/2007  . DEGENERATIVE DISC DISEASE, LUMBOSACRAL SPINE 08/19/2007  . LOW BACK PAIN SYNDROME 08/19/2007  . UPPER RESPIRATORY INFECTION, VIRAL 03/05/2007  . NEOPLASM, SKIN, UNCERTAIN BEHAVIOR 90/24/0973  . GANGLION CYST, WRIST, LEFT 01/28/2007  . DENTAL PAIN 01/19/2007  . DM (diabetes mellitus), type 2, uncontrolled with complications ([place]) 53/29/9242  . Hyperlipidemia with target LDL less than 70 12/10/2006  . Essential hypertension 12/10/2006  . RHINITIS, ALLERGIC NEC 12/10/2006  . HIP PAIN, RIGHT 12/10/2006   Narda Bonds, PTA [facility] 02/25/18 2:10 PM Phone: [phone] Fax: [facility] [address] [place], [place], [address] Phone: [phone]   Fax:  [phone]  Name: Leah Norton MRN: [id number] Date of Birth: 03-04-1956

## 2018-02-26 ENCOUNTER — Ambulatory Visit: Payer: BC Managed Care – PPO | Admitting: Adult Health

## 2018-02-26 ENCOUNTER — Encounter: Payer: Self-pay | Admitting: Adult Health

## 2018-02-26 ENCOUNTER — Telehealth: Payer: Self-pay

## 2018-02-26 VITALS — BP 159/85 | HR 81 | Ht 64.0 in | Wt 185.0 lb

## 2018-02-26 DIAGNOSIS — I10 Essential (primary) hypertension: Secondary | ICD-10-CM

## 2018-02-26 DIAGNOSIS — IMO0002 Reserved for concepts with insufficient information to code with codable children: Secondary | ICD-10-CM

## 2018-02-26 DIAGNOSIS — E118 Type 2 diabetes mellitus with unspecified complications: Secondary | ICD-10-CM

## 2018-02-26 DIAGNOSIS — G4730 Sleep apnea, unspecified: Secondary | ICD-10-CM

## 2018-02-26 DIAGNOSIS — E785 Hyperlipidemia, unspecified: Secondary | ICD-10-CM

## 2018-02-26 DIAGNOSIS — I639 Cerebral infarction, unspecified: Secondary | ICD-10-CM

## 2018-02-26 DIAGNOSIS — E1165 Type 2 diabetes mellitus with hyperglycemia: Secondary | ICD-10-CM

## 2018-02-26 NOTE — Patient Instructions (Signed)
Continue clopidogrel 75 mg daily  and lipitor  for secondary stroke prevention  Stop aspirin 81mg  at this time and continue plavix only  Continue to follow up with PCP regarding cholesterol, blood pressure and diabetes management   Recommend for you to not return to work at this time until your speech/cognitition improves along with your left sided weakness and balance difficulties  - recommend we hold off for 3 additional months at this time and we can re-evaluate at follow up appointment   You will be called to schedule sleep study and consult with one of our sleep doctors   Continue to monitor blood pressure at home  Possible occipital neuralgia - please speak with PCP regarding second opinion regarding ear pain. If your ear continues to look okay without issues, please let us know and we can speak about doing nerve block injections  Maintain strict control of hypertension with blood pressure goal below 130/90, diabetes with hemoglobin A1c goal below 6.5% and cholesterol with LDL cholesterol (bad cholesterol) goal below 70 mg/dL. I also advised the patient to eat a healthy diet with plenty of whole grains, cereals, fruits and vegetables, exercise regularly and maintain ideal body weight.  Followup in the future with me in 3 months or call earlier if needed       Thank you for coming to see [place] at [facility]. I hope we have been able to provide you high quality care today.  You may receive a patient satisfaction survey over the next few weeks. We would appreciate your feedback and comments so that we may continue to improve ourselves and the health of our patients.

## 2018-02-26 NOTE — Telephone Encounter (Signed)
Handicap form given to patient for 6 months per Janett Billow NP.

## 2018-02-27 ENCOUNTER — Ambulatory Visit: Payer: BC Managed Care – PPO | Admitting: Occupational Therapy

## 2018-02-27 ENCOUNTER — Ambulatory Visit: Payer: BC Managed Care – PPO | Admitting: Speech Pathology

## 2018-02-27 ENCOUNTER — Ambulatory Visit: Payer: BC Managed Care – PPO | Admitting: Physical Therapy

## 2018-02-27 ENCOUNTER — Telehealth: Payer: Self-pay | Admitting: *Deleted

## 2018-02-27 ENCOUNTER — Encounter: Payer: Self-pay | Admitting: Physical Therapy

## 2018-02-27 DIAGNOSIS — R41844 Frontal lobe and executive function deficit: Secondary | ICD-10-CM

## 2018-02-27 DIAGNOSIS — R278 Other lack of coordination: Secondary | ICD-10-CM | POA: Diagnosis not present

## 2018-02-27 DIAGNOSIS — R2689 Other abnormalities of gait and mobility: Secondary | ICD-10-CM

## 2018-02-27 DIAGNOSIS — R41841 Cognitive communication deficit: Secondary | ICD-10-CM

## 2018-02-27 DIAGNOSIS — M6281 Muscle weakness (generalized): Secondary | ICD-10-CM

## 2018-02-27 DIAGNOSIS — R2681 Unsteadiness on feet: Secondary | ICD-10-CM

## 2018-02-27 DIAGNOSIS — R471 Dysarthria and anarthria: Secondary | ICD-10-CM

## 2018-02-27 NOTE — Therapy (Signed)
[name] [address] [place], [place], [address] Phone: [phone]   Fax:  [phone]  Physical Therapy Treatment  Patient Details  Name: Leah Norton MRN: [id number] Date of Birth: Jun 03, 1955 Referring Provider (PT): Seward Carol, MD   Encounter Date: 02/27/2018  PT End of Session - 02/27/18 1502    Visit Number  8    Number of Visits  23    Date for PT Re-Evaluation  05/12/18    Authorization Type  State BCBS    PT Start Time  1406    PT Stop Time  1446    PT Time Calculation (min)  40 min    Activity Tolerance  Patient tolerated treatment well;Other (comment)   pt needed several seated rest breaks   Behavior During Therapy  WFL for tasks assessed/performed       Past Medical History:  Diagnosis Date  . Anginal pain ([place])   . Arthritis    "right anikle" (02/29/2016)  . CAD in native artery 02/08/2016   Three-vessel coronary artery calcification on CT scan  . Complication of anesthesia    "had reaction w/hives, rash, itching" (02/29/2016)  . Constipation   . Coronary artery disease    12/17 PCI with DESx1 to pLAD EF normal  . Enlarged heart   . GERD (gastroesophageal reflux disease)   . High cholesterol   . Hypertension   . Migraine    hx  . Stroke ([place])   . Symptomatic anemia 07/10/2016  . Type II diabetes mellitus ([place])     Past Surgical History:  Procedure Laterality Date  . ABDOMINAL HYSTERECTOMY    . CARDIAC CATHETERIZATION N/A 02/29/2016   Procedure: Left Heart Cath and Coronary Angiography;  Surgeon: Belva Crome, MD;  Location: [facility];  Service: Cardiovascular;  Laterality: N/A;  . CARDIAC CATHETERIZATION N/A 02/29/2016   Procedure: Coronary Stent Intervention;  Surgeon: Belva Crome, MD;  Location: [facility];  Service: Cardiovascular;  Laterality: N/A;  . CARDIAC CATHETERIZATION  2005   "no blockages"  . COLONOSCOPY WITH PROPOFOL N/A 03/15/2014   Procedure: COLONOSCOPY WITH PROPOFOL;  Surgeon: Garlan Fair, MD;  Location: [facility];  Service: Endoscopy;  Laterality: N/A;  . CORONARY ANGIOPLASTY WITH STENT PLACEMENT    . ESOPHAGOGASTRODUODENOSCOPY N/A 07/11/2016   Procedure: ESOPHAGOGASTRODUODENOSCOPY (EGD);  Surgeon: Laurence Spates, MD;  Location: [facility];  Service: Endoscopy;  Laterality: N/A;  . FRACTURE SURGERY    . MULTIPLE TOOTH EXTRACTIONS     teeth removed  . ORIF ANKLE FRACTURE Right 11/12/2012   Procedure: OPEN REDUCTION INTERNAL FIXATION (ORIF) ANKLE FRACTURE;  Surgeon: Marianna Payment, MD;  Location: [facility];  Service: Orthopedics;  Laterality: Right;  Open reduction internal fixation ankle fracture, trimalleolar    There were no vitals filed for this visit.  Subjective Assessment - 02/27/18 1409    Subjective  No changes since last visit.    Pertinent History  PMH includes essential HTN, hyperlipidemia, DM, DDD lumbo-sacral spine; Hx of CVA 01/15/18    Patient Stated Goals  Pt's goals for physical therapy are to do whatever I can to return to normal.    Currently in Pain?  Yes    Pain Score  5     Pain Location  Foot    Pain Orientation  Right    Pain Descriptors / Indicators  Aching    Pain Type  Acute pain    Pain Onset  Yesterday  Pain Frequency  Intermittent    Aggravating Factors   unsure    Pain Relieving Factors  unsure, sometimes elevating and soaking foot                       [facility] Adult PT Treatment/Exercise - 02/27/18 0001      Ambulation/Gait   Ambulation/Gait  Yes    Ambulation/Gait Assistance  5: Supervision    Ambulation/Gait Assistance Details  Occasional conversation tasks; pt needs occasional cues for increased L foot clearance, increased L heelstrike    Ambulation Distance (Feet)  230 Feet   then 460   Assistive device  None    Gait Pattern  Step-through pattern;Decreased step length - left;Decreased trunk rotation;Decreased arm swing - left;Poor foot clearance -  left;Decreased dorsiflexion - left   shuffles at times, decreased L heelstrike   Ambulation Surface  Level;Indoor    Gait Comments  Short distance forward/back gait with supervision, 2 reps x 10 ft.      Lumbar Exercises: Seated   Other Seated Lumbar Exercises  Attempted core stability exercises seated on blue therapy ball (pt has difficulty maintaining balance on ball, despite ball being secure between therapist and mat table.  Attempted anterior/posterior pelvic tilts and lateral pelvic tilts, but stopped this activity due to pt having difficulty safely sequencing pelvic tilts.  Transitioned to seated on mat on green balance disk:  cues for abdominal activation with UE lifts, then marching in place, then LAQ (cues for smaller leg movements for control through trunk and core).  Rhythmic stabilization anterior/posterior resistance given at shoulders, with pt seated on green balance disk and with pt sitting on mat table.          Balance Exercises - 02/27/18 1456      Balance Exercises: Standing   Tandem Stance  Eyes open;Upper extremity support 1;3 reps;10 secs   for hip stability, cues for upright standing   Tandem Gait  Forward;Upper extremity support;Other reps (comment)   6 counter lengthsthen compliant surface, 4 lengths   Retro Gait  Foam/compliant surface;Upper extremity support;3 reps   forward/back length of counter-cues for foot clearance   Sidestepping  Foam/compliant support;3 reps   Solid surface and foam surface along counter   Marching Limitations  on compliant surface-forward marching, 4 lengths of counter          PT Short Term Goals - 02/14/18 2057      PT SHORT TERM GOAL #1   Title  Pt will be independent with HEP for improved strength, balance, gait.  TARGET 03/14/18    Time  5    Period  Weeks      PT SHORT TERM GOAL #2   Title  Pt will improve DGI score to at least 21/24 for decreased fall risk.    Time  5    Period  Weeks    Status  New      PT SHORT  TERM GOAL #3   Title  Pt to improve 6 minute walk test goal by at least 150 ft for improved gait efficiency in community.    Baseline  02/13/18:  866 ft in 6MWT    Time  5    Period  Weeks    Status  Revised      PT SHORT TERM GOAL #4   Title  Pt will verbalize understanding of CVA warning signs and symptoms.    Time  5    Period  Weeks  Status  New        PT Long Term Goals - 02/11/18 1459      PT LONG TERM GOAL #1   Title  Pt will verbalize understanding of fall prevention in the home environment.  TARGET 04/11/18    Time  9    Period  Weeks    Status  New    Target Date  04/11/18      PT LONG TERM GOAL #2   Title  Pt will improve 5x sit<>stand score to less than or equal to 11.4 seconds for improved functional strength and transfer efficiency.    Time  9    Period  Weeks    Status  New    Target Date  04/11/18      PT LONG TERM GOAL #3   Title  Pt will improve gait velocity to at least 2.62 ft/sec for improved gait efficiency and safety.    Time  9    Period  Weeks    Status  New    Target Date  04/11/18      PT LONG TERM GOAL #4   Title  Pt will ambulate at least 1000 ft, indoor and outdoor surfaces, independently no LOB, for improved gait negotiation outdoor surfaces.    Time  9    Period  Weeks    Status  New    Target Date  04/11/18            Plan - 02/27/18 1505    Clinical Impression Statement  Skilled PT session focused on seated trunk control and standing balance activites for hip stability and balance.  Pt not able to tolerate sitting balance/trunk control on blue therapy, therefore transitioned to green balance disk, with multiple cues for control/upright posture.  Pt requires intermittent UE support for dynmaic balance activites for improved stability, and requires cues throughout session for improved L foot clearance, L heelstrike.    Rehab Potential  Good    PT Frequency  Other (comment)   1/wk in addition to eval, then 3x/wk for 4 weeks,  2x/wk for 4 weeks   PT Treatment/Interventions  ADLs/Self Care Home Management;Gait training;Functional mobility training;Therapeutic activities;Therapeutic exercise;Balance training;Patient/family education;Neuromuscular re-education    PT Next Visit Plan  Continue trunk control activities on green balance disk (and possibly transition to seated therapy ball); SLS activities, L step length/dorsiflexion/heelstrike    Consulted and Agree with Plan of Care  Patient       Patient will benefit from skilled therapeutic intervention in order to improve the following deficits and impairments:  Abnormal gait, Decreased balance, Decreased mobility, Difficulty walking, Decreased strength  Visit Diagnosis: Muscle weakness (generalized)  Unsteadiness on feet  Other abnormalities of gait and mobility     Problem List Patient Active Problem List   Diagnosis Date Noted  . Acute ischemic stroke ([place]) 01/16/2018  . Acute CVA (cerebrovascular accident) ([place]) 01/16/2018  . GI bleed 07/10/2016  . Morbid obesity due to excess calories ([place]) 05/14/2016  . Dyspnea on exertion 04/29/2016  . Abnormal nuclear stress test   . CAD S/P percutaneous coronary angioplasty 02/08/2016  . INSOMNIA, CHRONIC 02/28/2010  . HEADACHE 02/28/2010  . HEEL PAIN, RIGHT 11/29/2009  . COUGH DUE TO ACE INHIBITORS 11/29/2009  . ATRIAL ENLARGEMENT, LEFT 03/21/2009  . ANEMIA 12/28/2008  . CARDIAC MURMUR 12/28/2008  . OTHER NONSPECIFIC ABNORMAL SERUM ENZYME LEVELS 12/28/2008  . CERVICAL MUSCLE STRAIN 12/28/2008  . CONSTIPATION 06/01/2008  . POSTMENOPAUSAL STATUS 05/15/2008  .  BRONCHITIS, CHRONIC 12/18/2007  . Cigarette smoker 08/19/2007  . DEGENERATIVE DISC DISEASE, LUMBOSACRAL SPINE 08/19/2007  . LOW BACK PAIN SYNDROME 08/19/2007  . UPPER RESPIRATORY INFECTION, VIRAL 03/05/2007  . NEOPLASM, SKIN, UNCERTAIN BEHAVIOR 30/14/8403  . GANGLION CYST, WRIST, LEFT 01/28/2007  . DENTAL PAIN 01/19/2007  . DM (diabetes mellitus),  type 2, uncontrolled with complications ([place]) 97/95/3692  . Hyperlipidemia with target LDL less than 70 12/10/2006  . Essential hypertension 12/10/2006  . RHINITIS, ALLERGIC NEC 12/10/2006  . HIP PAIN, RIGHT 12/10/2006    Marionna Gonia W. 02/27/2018, 3:10 PM Frazier Butt., PT  [facility] [address] [place], [place], [address] Phone: [phone]   Fax:  [phone]  Name: DALANA PFAHLER MRN: [id number] Date of Birth: Jan 02, 1956

## 2018-02-27 NOTE — Therapy (Signed)
Leah Norton [address] [place], [place], [address] Phone: [phone]   Fax:  [phone]  Speech Language Pathology Treatment  Patient Details  Name: Leah Norton MRN: [id number] Date of Birth: 12/09/1955 Referring Provider (SLP): Seward Carol, MD   Encounter Date: 02/27/2018  End of Session - 02/27/18 1633    Visit Number  8    Number of Visits  21    Date for SLP Re-Evaluation  04/12/18    Authorization Type  BCBS    SLP Start Time  0258    SLP Stop Time   1401    SLP Time Calculation (min)  44 min    Activity Tolerance  Patient tolerated treatment well       Past Medical History:  Diagnosis Date  . Anginal pain ([place])   . Arthritis    "right anikle" (02/29/2016)  . CAD in native artery 02/08/2016   Three-vessel coronary artery calcification on CT scan  . Complication of anesthesia    "had reaction w/hives, rash, itching" (02/29/2016)  . Constipation   . Coronary artery disease    12/17 PCI with DESx1 to pLAD EF normal  . Enlarged heart   . GERD (gastroesophageal reflux disease)   . High cholesterol   . Hypertension   . Migraine    hx  . Stroke ([place])   . Symptomatic anemia 07/10/2016  . Type II diabetes mellitus ([place])     Past Surgical History:  Procedure Laterality Date  . ABDOMINAL HYSTERECTOMY    . CARDIAC CATHETERIZATION N/A 02/29/2016   Procedure: Left Heart Cath and Coronary Angiography;  Surgeon: Belva Crome, MD;  Location: [facility];  Service: Cardiovascular;  Laterality: N/A;  . CARDIAC CATHETERIZATION N/A 02/29/2016   Procedure: Coronary Stent Intervention;  Surgeon: Belva Crome, MD;  Location: [facility];  Service: Cardiovascular;  Laterality: N/A;  . CARDIAC CATHETERIZATION  2005   "no blockages"  . COLONOSCOPY WITH PROPOFOL N/A 03/15/2014   Procedure: COLONOSCOPY WITH PROPOFOL;  Surgeon: Garlan Fair, MD;  Location: [facility];  Service: Endoscopy;   Laterality: N/A;  . CORONARY ANGIOPLASTY WITH STENT PLACEMENT    . ESOPHAGOGASTRODUODENOSCOPY N/A 07/11/2016   Procedure: ESOPHAGOGASTRODUODENOSCOPY (EGD);  Surgeon: Laurence Spates, MD;  Location: [facility];  Service: Endoscopy;  Laterality: N/A;  . FRACTURE SURGERY    . MULTIPLE TOOTH EXTRACTIONS     teeth removed  . ORIF ANKLE FRACTURE Right 11/12/2012   Procedure: OPEN REDUCTION INTERNAL FIXATION (ORIF) ANKLE FRACTURE;  Surgeon: Marianna Payment, MD;  Location: [facility];  Service: Orthopedics;  Laterality: Right;  Open reduction internal fixation ankle fracture, trimalleolar    There were no vitals filed for this visit.  Subjective Assessment - 02/27/18 1322    Subjective  "I got my driving back.     Currently in Pain?  No/denies            ADULT SLP TREATMENT - 02/27/18 1316      General Information   Behavior/Cognition  Alert;Cooperative;Pleasant mood      Treatment Provided   Treatment provided  Cognitive-Linquistic      Pain Assessment   Pain Assessment  No/denies pain      Cognitive-Linquistic Treatment   Treatment focused on  Cognition;Dysarthria    Skilled Treatment  Cognitive treatment (28 min) Pt told SLP she has been cleared to drive by NP. SLP discussed pt's cognitive deficits and how those contribute to her ability  to drive safely even if she had been medically cleared. Pt agreed that with slowed thinking, she might be slow to react. SLP encouraged pt to reconsider driving at this time, or if she does resume to practice first off the road with supervision (Pt drove herself to appointment today). SLP targeted pt's attention to detail, recall and awareness in functional task by having pt take notes and ask investigative questions about a public safety scenario in order to write a report. Prior to taking notes on the scenario, SLP had pt read a similar incident report she had completed prior to her stroke. Pt took more thorough notes than during previous session, and  requested appropriate clarification about a detail that was omitted (AM/PM). Extended time required to write her report, and she required mod A for awareness and correction of spelling errors (50% awareness with cues for double checking). Speech tx (22 min): Pt read workplace reports using dysarthria compensations with occasional min A for slower rate. Generated personally relevant sentences with workplace vocabulary 18/20 with [facility] fluidity using slower rate.      Assessment / Recommendations / Plan   Plan  Continue with current plan of care      Progression Toward Goals   Progression toward goals  Progressing toward goals       SLP Education - 02/27/18 1632    Education Details  do not recommend driving at this time due to slow processing, attention deficits    Person(s) Educated  Patient    Methods  Explanation    Comprehension  Verbalized understanding;Need further instruction       SLP Short Term Goals - 02/27/18 [place] #1   Title  Pt will respond with personally relevant sentences 18/20 with [facility] speech fluidity over three sessions with use of compensations (slow rate)    Baseline  02/27/18    Time  2    Period  Weeks    Status  On-going      SLP SHORT TERM GOAL #2   Title  Pt will use a memory compensation system to record, reference and organize daily activities/appointments with occasional min A x 2 sessions     Time  2    Period  Weeks    Status  On-going      SLP SHORT TERM GOAL #3   Title  Pt will tell SLP 2 cognitive deficits that would impact her at work.    Baseline  02/25/18    Time  2    Period  Weeks    Status  On-going      SLP SHORT TERM GOAL #4   Title  Pt will demo attention to detail by recalling pertinent information from 3-5 sentence auditory messages 85% accuracy x 3 sessions (verbal/ written summary or responding to questions)    Time  2    Period  Weeks    Status  On-going       SLP Long Term Goals - 02/27/18 1635       SLP LONG TERM GOAL #1   Title  Using compensatory strategies for fluency/dysarthria, pt will functionally ask investigative questions or give a verbal report of functional scenarios over 4 sessions    Time  6    Period  Weeks    Status  On-going      SLP LONG TERM GOAL #2   Title  Pt will demo WFL fluidity of speech in 10 minutes  mod to complex/complex conversation with modified independence outside of ST room x 3 sessions    Time  6    Period  Weeks    Status  On-going      SLP LONG TERM GOAL #3   Title  pt will demo functional executive function (e.g., planning, organization) ability in personally relevant tasks (written and/or auditorily provided) 90% of the time     Time  6    Period  Weeks    Status  On-going      SLP LONG TERM GOAL #4   Title  Pt will demo 15 minutes alternating attention between two mod complex cognitive tasks to achieve >85% success on each task over two sessions    Time  6    Period  Weeks    Status  On-going      SLP LONG TERM GOAL #5   Title  pt will demo WNL emergent awareness with mod complex tasks in order to achieve >95% success over three sessions     Time  6    Period  Weeks    Status  On-going       Plan - 02/27/18 1633    Clinical Impression Statement  Patient presents with moderate cognitive communication deficits, and mild dysarthria. Very mild, rare dysfluencies today in conversation. Impairments in selective attn, attn to detail, executive function, and awareness noted today, as well as impulsivity. Pt has returned to driving; SLP encouraged pt today to reconsider or at least begin by practicing with supervision due to cognitive deficits. 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 Deficits in attention, recall, and organization would hinder her ability to complete job duties at this time, including completing investigations, dictation, and writing  reports. I recommend skilled ST to maximize cognitive and communicative function for possible return to work and improved independence and QOL.    Speech Therapy Frequency  3x / week   3x per week for 4 weeks, then 2x per week for 4 weeks   Duration  --   8 weeks or 21 total visits   Treatment/Interventions  Cognitive reorganization;Multimodal communcation approach;Compensatory strategies;Compensatory techniques;Cueing hierarchy;Internal/external aids;Functional tasks;SLP instruction and feedback;Patient/family education    Potential to Achieve Goals  Good       Patient will benefit from skilled therapeutic intervention in order to improve the following deficits and impairments:   Cognitive communication deficit  Dysarthria and anarthria    Problem List Patient Active Problem List   Diagnosis Date Noted  . Acute ischemic stroke ([place]) 01/16/2018  . Acute CVA (cerebrovascular accident) ([place]) 01/16/2018  . GI bleed 07/10/2016  . Morbid obesity due to excess calories ([place]) 05/14/2016  . Dyspnea on exertion 04/29/2016  . Abnormal nuclear stress test   . CAD S/P percutaneous coronary angioplasty 02/08/2016  . INSOMNIA, CHRONIC 02/28/2010  . HEADACHE 02/28/2010  . HEEL PAIN, RIGHT 11/29/2009  . COUGH DUE TO ACE INHIBITORS 11/29/2009  . ATRIAL ENLARGEMENT, LEFT 03/21/2009  . ANEMIA 12/28/2008  . CARDIAC MURMUR 12/28/2008  . OTHER NONSPECIFIC ABNORMAL SERUM ENZYME LEVELS 12/28/2008  . CERVICAL MUSCLE STRAIN 12/28/2008  . CONSTIPATION 06/01/2008  . POSTMENOPAUSAL STATUS 05/15/2008  . BRONCHITIS, CHRONIC 12/18/2007  . Cigarette smoker 08/19/2007  . DEGENERATIVE DISC DISEASE, LUMBOSACRAL SPINE 08/19/2007  . LOW BACK PAIN SYNDROME 08/19/2007  . UPPER RESPIRATORY INFECTION, VIRAL 03/05/2007  . NEOPLASM, SKIN, UNCERTAIN BEHAVIOR 09/73/5329  .  GANGLION CYST, WRIST, LEFT 01/28/2007  . DENTAL PAIN 01/19/2007  . DM (diabetes mellitus), type 2, uncontrolled with complications ([place])  48/62/8241  . Hyperlipidemia with target LDL less than 70 12/10/2006  . Essential hypertension 12/10/2006  . RHINITIS, ALLERGIC NEC 12/10/2006  . HIP PAIN, RIGHT 12/10/2006   Deneise Lever, [place], [place] Speech-Language Pathologist  Aliene Altes 02/27/2018, 4:36 PM  [facility] [address] [place], [place], [address] Phone: [phone]   Fax:  [phone]   Name: SHAUNTAVIA BRACKIN MRN: [id number] Date of Birth: [birth year]

## 2018-02-27 NOTE — Therapy (Addendum)
[facility] [address] [place], [place], [address] Phone: [phone]   Fax:  [phone]  Occupational Therapy Treatment  Patient Details  Name: Leah Norton MRN: [id number] Date of Birth: [birth year] Referring Provider (OT): Dr. Delfina Redwood   Encounter Date: 02/27/2018  OT End of Session - 02/27/18 1509    Visit Number  6    Number of Visits  25    Date for OT Re-Evaluation  04/12/18    Authorization Type  State BCBS    OT Start Time  1450   2 units, pt in BR   OT Stop Time  1529    OT Time Calculation (min)  39 min    Activity Tolerance  Patient tolerated treatment well    Behavior During Therapy  [facility] for tasks assessed/performed       Past Medical History:  Diagnosis Date  . Anginal pain ([place])   . Arthritis    "right anikle" (02/29/2016)  . CAD in native artery 02/08/2016   Three-vessel coronary artery calcification on CT scan  . Complication of anesthesia    "had reaction w/hives, rash, itching" (02/29/2016)  . Constipation   . Coronary artery disease    12/17 PCI with DESx1 to pLAD EF normal  . Enlarged heart   . GERD (gastroesophageal reflux disease)   . High cholesterol   . Hypertension   . Migraine    hx  . Stroke ([place])   . Symptomatic anemia 07/10/2016  . Type II diabetes mellitus ([place])     Past Surgical History:  Procedure Laterality Date  . ABDOMINAL HYSTERECTOMY    . CARDIAC CATHETERIZATION N/A 02/29/2016   Procedure: Left Heart Cath and Coronary Angiography;  Surgeon: Belva Crome, MD;  Location: [facility];  Service: Cardiovascular;  Laterality: N/A;  . CARDIAC CATHETERIZATION N/A 02/29/2016   Procedure: Coronary Stent Intervention;  Surgeon: Belva Crome, MD;  Location: [facility];  Service: Cardiovascular;  Laterality: N/A;  . CARDIAC CATHETERIZATION  2005   "no blockages"  . COLONOSCOPY WITH PROPOFOL N/A 03/15/2014   Procedure: COLONOSCOPY WITH PROPOFOL;   Surgeon: Garlan Fair, MD;  Location: [facility];  Service: Endoscopy;  Laterality: N/A;  . CORONARY ANGIOPLASTY WITH STENT PLACEMENT    . ESOPHAGOGASTRODUODENOSCOPY N/A 07/11/2016   Procedure: ESOPHAGOGASTRODUODENOSCOPY (EGD);  Surgeon: Laurence Spates, MD;  Location: [facility] ENDOSCOPY;  Service: Endoscopy;  Laterality: N/A;  . FRACTURE SURGERY    . MULTIPLE TOOTH EXTRACTIONS     teeth removed  . ORIF ANKLE FRACTURE Right 11/12/2012   Procedure: OPEN REDUCTION INTERNAL FIXATION (ORIF) ANKLE FRACTURE;  Surgeon: Marianna Payment, MD;  Location: [facility];  Service: Orthopedics;  Laterality: Right;  Open reduction internal fixation ankle fracture, trimalleolar    There were no vitals filed for this visit.  Subjective Assessment - 02/27/18 1451    Patient Stated Goals  return to work    Currently in Pain?  Yes    Pain Score  5     Pain Location  Foot    Pain Orientation  Right    Pain Descriptors / Indicators  Aching    Pain Type  Acute pain    Pain Onset  Yesterday    Pain Frequency  Intermittent    Aggravating Factors   unsure    Pain Relieving Factors  unsure    Multiple Pain Sites  No  treatment: Reviewed red theraband exercises 15 reps each, min v.c initially then pt returned demonstration Ambulating while tossing ball with bilateral UE's, rolling ball up vertical surface with LUE, min- mod difficulty Dribbling ball with LUE for speed and timing, min-mod difficulty shoulder flexion high range seated min v.c,  Arm bike x 6 mins level 3 for conditioning, min v.c , diagonals in standing with medium ball, min v.c and close supervision Gripper set a level 2 to pick up 1 inch blocks, min difficulty         OT Education - 02/27/18 1519    Education Details  Recommendation that pt does not drive or initally  drives with supevisiondue to slowed processing, pt states that  she was cleared at her neurology visit    Person(s) Educated  Patient    Methods   Explanation;Verbal cues    Comprehension  Verbalized understanding       OT Short Term Goals - 02/25/18 1014      OT SHORT TERM GOAL #1   Title  I with inital HEP.    Period  Weeks    Status  On-going      OT SHORT TERM GOAL #2   Title  Pt will improve LUE grip strength by 10 lbs for increased LUE functional use during ADLS.    Baseline  RUE 32 lbs, LUE 10 lbs    Time  4    Period  Weeks    Status  On-going      OT SHORT TERM GOAL #3   Title  Pt will demonstrate improved LUE functional use for ADLSas evidenced by decreasing 9 hole peg test score to 30 secs or less.    Baseline  RUE 27.79 secs, LUE 35.28 secs    Time  4    Period  Weeks    Status  On-going      OT SHORT TERM GOAL #4   Title  Pt will demonstrate ability to perform basic cooking at a modified independent level demonstrating good safety awareness.    Time  4    Period  Weeks    Status  On-going      OT SHORT TERM GOAL #5   Title  Pt will demonstrate ability to maintain selective attention to a functional task in a busy environment x 30 mins without redirects.    Time  4    Period  Weeks        OT Long Term Goals - 02/11/18 1752      OT LONG TERM GOAL #1   Title  I with updated HEP.    Time  8    Period  Weeks    Status  New      OT LONG TERM GOAL #2   Title  Pt will demonstrate ability to retrieve a 3 lbs item from overhead shelf with LUE demonstrating good control.    Time  8    Period  Weeks    Status  New      OT LONG TERM GOAL #3   Title  Pt will demonstrate ability to perform a physical and cognitive task simultaneously with 90% or better accuracy in prep for driving and work activities.    Time  8    Period  Weeks    Status  New      OT LONG TERM GOAL #4   Title  Pt will perfrom simulated work activities at a modified independent level.  Time  8    Period  Weeks    Status  New      OT LONG TERM GOAL #5   Title  Pt will perform mod complex home management at a modified independent  level.    Time  8    Period  Weeks    Status  New      Long Term Additional Goals   Additional Long Term Goals  Yes      OT LONG TERM GOAL #6   Title  Pt will demonstrate ability to perfrom mod complex cooking (ie: 2 items at 1 time )modified independently demonstrating good safety awareness.    Time  8    Period  Weeks    Status  New            Plan - 02/27/18 1511    Clinical Impression Statement  Pt is progressing towards goals with improving LUE strength and control. she continues to fatigue quickly,    Occupational Profile and client history currently impacting functional performance  PMH: DM II, HTN, CAD s/p percutaneous angioplasty, hx of GI bleed, tobacco use, Pt was completely I and working full time as [employer], Pt is currently living with family and requires assistance for transportation.    Occupational performance deficits (Please refer to evaluation for details):  ADL's;IADL's;Work;Play;Leisure;Social Participation    Rehab Potential  Good    Current Impairments/barriers affecting progress:  cognitive deficits, impulsivity, decreased awareness, decreased coordination    OT Frequency  3x / week    OT Duration  8 weeks    OT Treatment/Interventions  Self-care/ADL training;Therapeutic exercise;Gait Training;Aquatic Therapy;Moist Heat;Paraffin;Neuromuscular education;Splinting;Patient/family education;Balance training;Therapeutic activities;Functional Mobility Training;Energy conservation;Fluidtherapy;Ultrasound;Cryotherapy;Contrast Bath;DME and/or AE instruction;Manual Therapy;Passive range of motion;Cognitive remediation/compensation    Plan  continue to address LUE coordination, stength and functional use with cognitive component    Consulted and Agree with Plan of Care  Patient       Patient will benefit from skilled therapeutic intervention in order to improve the following deficits and impairments:  Abnormal gait, Decreased mobility, Decreased  coordination, Decreased range of motion, Decreased strength, Impaired UE functional use, Impaired perceived functional ability, Difficulty walking, Decreased safety awareness, Decreased knowledge of precautions, Decreased balance  Visit Diagnosis: Muscle weakness (generalized)  Frontal lobe and executive function deficit  Other lack of coordination  Other abnormalities of gait and mobility    Problem List Patient Active Problem List   Diagnosis Date Noted  . Acute ischemic stroke ([place]) 01/16/2018  . Acute CVA (cerebrovascular accident) ([place]) 01/16/2018  . GI bleed 07/10/2016  . Morbid obesity due to excess calories ([place]) 05/14/2016  . Dyspnea on exertion 04/29/2016  . Abnormal nuclear stress test   . CAD S/P percutaneous coronary angioplasty 02/08/2016  . INSOMNIA, CHRONIC 02/28/2010  . HEADACHE 02/28/2010  . HEEL PAIN, RIGHT 11/29/2009  . COUGH DUE TO ACE INHIBITORS 11/29/2009  . ATRIAL ENLARGEMENT, LEFT 03/21/2009  . ANEMIA 12/28/2008  . CARDIAC MURMUR 12/28/2008  . OTHER NONSPECIFIC ABNORMAL SERUM ENZYME LEVELS 12/28/2008  . CERVICAL MUSCLE STRAIN 12/28/2008  . CONSTIPATION 06/01/2008  . POSTMENOPAUSAL STATUS 05/15/2008  . BRONCHITIS, CHRONIC 12/18/2007  . Cigarette smoker 08/19/2007  . DEGENERATIVE DISC DISEASE, LUMBOSACRAL SPINE 08/19/2007  . LOW BACK PAIN SYNDROME 08/19/2007  . UPPER RESPIRATORY INFECTION, VIRAL 03/05/2007  . NEOPLASM, SKIN, UNCERTAIN BEHAVIOR 76/16/0737  . GANGLION CYST, WRIST, LEFT 01/28/2007  . DENTAL PAIN 01/19/2007  . DM (diabetes mellitus), type 2, uncontrolled with complications ([place])  12/10/2006  . Hyperlipidemia with target LDL less than 70 12/10/2006  . Essential hypertension 12/10/2006  . RHINITIS, ALLERGIC NEC 12/10/2006  . HIP PAIN, RIGHT 12/10/2006    Leah Norton 02/27/2018, 3:29 PM  [facility] [address] [place], [place], [address] Phone: [phone]   Fax:   [phone]  Name: Leah Norton MRN: [id number] Date of Birth: 04-03-1955

## 2018-02-27 NOTE — Telephone Encounter (Signed)
I call pt LVM, pt need to pay the fee for the form.

## 2018-03-03 ENCOUNTER — Ambulatory Visit: Payer: BC Managed Care – PPO

## 2018-03-03 ENCOUNTER — Encounter: Payer: Self-pay | Admitting: Physical Therapy

## 2018-03-03 ENCOUNTER — Encounter: Payer: Self-pay | Admitting: Occupational Therapy

## 2018-03-03 ENCOUNTER — Ambulatory Visit: Payer: BC Managed Care – PPO | Admitting: Physical Therapy

## 2018-03-03 ENCOUNTER — Ambulatory Visit: Payer: BC Managed Care – PPO | Admitting: Occupational Therapy

## 2018-03-03 DIAGNOSIS — R41844 Frontal lobe and executive function deficit: Secondary | ICD-10-CM

## 2018-03-03 DIAGNOSIS — M6281 Muscle weakness (generalized): Secondary | ICD-10-CM

## 2018-03-03 DIAGNOSIS — R471 Dysarthria and anarthria: Secondary | ICD-10-CM

## 2018-03-03 DIAGNOSIS — R278 Other lack of coordination: Secondary | ICD-10-CM

## 2018-03-03 DIAGNOSIS — R2681 Unsteadiness on feet: Secondary | ICD-10-CM

## 2018-03-03 DIAGNOSIS — R2689 Other abnormalities of gait and mobility: Secondary | ICD-10-CM

## 2018-03-03 DIAGNOSIS — R41841 Cognitive communication deficit: Secondary | ICD-10-CM

## 2018-03-03 NOTE — Therapy (Signed)
[facility] [address] [place], [place], [address] Phone: [phone]   Fax:  [phone]  Speech Language Pathology Treatment  Patient Details  Name: Leah Norton MRN: [id number] Date of Birth: [birth year] Referring Provider (SLP): Seward Carol, MD   Encounter Date: 03/03/2018  End of Session - 03/03/18 1655    Visit Number  9    Number of Visits  21    Date for SLP Re-Evaluation  04/12/18    SLP Start Time  12    SLP Stop Time   1401    SLP Time Calculation (min)  41 min    Activity Tolerance  Patient tolerated treatment well       Past Medical History:  Diagnosis Date  . Anginal pain ([place])   . Arthritis    "right anikle" (02/29/2016)  . CAD in native artery 02/08/2016   Three-vessel coronary artery calcification on CT scan  . Complication of anesthesia    "had reaction w/hives, rash, itching" (02/29/2016)  . Constipation   . Coronary artery disease    12/17 PCI with DESx1 to pLAD EF normal  . Enlarged heart   . GERD (gastroesophageal reflux disease)   . High cholesterol   . Hypertension   . Migraine    hx  . Stroke ([place])   . Symptomatic anemia 07/10/2016  . Type II diabetes mellitus ([place])     Past Surgical History:  Procedure Laterality Date  . ABDOMINAL HYSTERECTOMY    . CARDIAC CATHETERIZATION N/A 02/29/2016   Procedure: Left Heart Cath and Coronary Angiography;  Surgeon: Belva Crome, MD;  Location: [facility];  Service: Cardiovascular;  Laterality: N/A;  . CARDIAC CATHETERIZATION N/A 02/29/2016   Procedure: Coronary Stent Intervention;  Surgeon: Belva Crome, MD;  Location: [facility];  Service: Cardiovascular;  Laterality: N/A;  . CARDIAC CATHETERIZATION  2005   "no blockages"  . COLONOSCOPY WITH PROPOFOL N/A 03/15/2014   Procedure: COLONOSCOPY WITH PROPOFOL;  Surgeon: Garlan Fair, MD;  Location: [facility];  Service: Endoscopy;  Laterality: N/A;  .  CORONARY ANGIOPLASTY WITH STENT PLACEMENT    . ESOPHAGOGASTRODUODENOSCOPY N/A 07/11/2016   Procedure: ESOPHAGOGASTRODUODENOSCOPY (EGD);  Surgeon: Laurence Spates, MD;  Location: [facility];  Service: Endoscopy;  Laterality: N/A;  . FRACTURE SURGERY    . MULTIPLE TOOTH EXTRACTIONS     teeth removed  . ORIF ANKLE FRACTURE Right 11/12/2012   Procedure: OPEN REDUCTION INTERNAL FIXATION (ORIF) ANKLE FRACTURE;  Surgeon: Marianna Payment, MD;  Location: [facility];  Service: Orthopedics;  Laterality: Right;  Open reduction internal fixation ankle fracture, trimalleolar    There were no vitals filed for this visit.  Subjective Assessment - 03/03/18 1327    Subjective  Pt arrived at 1322. "There was a line at the post office."    Currently in Pain?  No/denies            ADULT SLP TREATMENT - 03/03/18 1328      General Information   Behavior/Cognition  Alert;Cooperative;Pleasant mood      Treatment Provided   Treatment provided  Cognitive-Linquistic      Cognitive-Linquistic Treatment   Treatment focused on  Cognition    Skilled Treatment  (cognitive skills 14 minutes) Pt with conversation with SLP regarding possible "assistance programs" for 2020, as pt has new deductible (anticipatory awareness). When SLP told pt he did not know how billing would work in that situation pt asked for number of  someone who she could ask (anticipatory awareness, problem solving). "I wrote incident reports - - (looked at notebook) and did some pronouciation." (pt, re: last session ST). (speech tx indiv) SLP worked through [employer] with her - pt req'd cues to review from top to bottom and not in random order. Pt req'd mod A occasionally for procedure. Suspect pt memory of how to complete is lacking due to decr'd selective attention (internal distraction).       Assessment / Recommendations / Plan   Plan  Continue with current plan of care      Progression Toward Goals   Progression toward goals  Progressing toward  goals       SLP Education - 03/03/18 1654    Education Details  HEP procedure, info for insurance    Person(s) Educated  Patient    Methods  Explanation;Handout    Comprehension  Verbalized understanding       SLP Short Term Goals - 03/03/18 1658      SLP SHORT TERM GOAL #1   Title  Pt will respond with personally relevant sentences 18/20 with [facility] speech fluidity over three sessions with use of compensations (slow rate)    Baseline  02/27/18, 03-03-18    Time  1    Period  Weeks    Status  On-going      SLP SHORT TERM GOAL #2   Title  Pt will use a memory compensation system to record, reference and organize daily activities/appointments with occasional min A x 2 sessions     Baseline  03-03-18    Time  1    Period  Weeks    Status  On-going      SLP SHORT TERM GOAL #3   Title  Pt will tell SLP 2 cognitive deficits that would impact her at work.    Baseline  02/25/18    Time  1    Period  Weeks    Status  On-going      SLP SHORT TERM GOAL #4   Title  Pt will demo attention to detail by recalling pertinent information from 3-5 sentence auditory messages 85% accuracy x 3 sessions (verbal/ written summary or responding to questions)    Time  1    Period  Weeks    Status  On-going       SLP Long Term Goals - 03/03/18 1658      SLP LONG TERM GOAL #1   Title  Using compensatory strategies for fluency/dysarthria, pt will functionally ask investigative questions or give a verbal report of functional scenarios over 4 sessions    Time  5    Period  Weeks    Status  On-going      SLP LONG TERM GOAL #2   Title  Pt will demo WFL fluidity of speech in 10 minutes mod to complex/complex conversation with modified independence outside of ST room x 3 sessions    Time  5    Period  Weeks    Status  On-going      SLP LONG TERM GOAL #3   Title  pt will demo functional executive function (e.g., planning, organization) ability in personally relevant tasks (written and/or auditorily  provided) 90% of the time     Time  5    Period  Weeks    Status  On-going      SLP LONG TERM GOAL #4   Title  Pt will demo 15 minutes alternating attention between  two mod complex cognitive tasks to achieve >85% success on each task over two sessions    Time  5    Period  Weeks    Status  On-going      SLP LONG TERM GOAL #5   Title  pt will demo WNL emergent awareness with mod complex tasks in order to achieve >95% success over three sessions     Time  5    Period  Weeks    Status  On-going       Plan - 03/03/18 1655    Clinical Impression Statement  Patient presents with moderate cognitive communication deficits, and mild dysarthria. No dysfluencies heard today in conversation. Impairments persist in selective attn, attn to detail, executive function, and awareness noted today, as well as impulsivity. 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 Deficits in attention, recall, and organization would hinder her ability to complete job duties at this time, including completing investigations, dictation, and writing reports. I recommend skilled ST to maximize cognitive and communicative function for possible return to work and improved independence and QOL.    Speech Therapy Frequency  3x / week   3x per week for 4 weeks, then 2x per week for 4 weeks   Duration  --   8 weeks or 21 total visits   Treatment/Interventions  Cognitive reorganization;Multimodal communcation approach;Compensatory strategies;Compensatory techniques;Cueing hierarchy;Internal/external aids;Functional tasks;SLP instruction and feedback;Patient/family education    Potential to Achieve Goals  Good       Patient will benefit from skilled therapeutic intervention in order to improve the following deficits and impairments:   Cognitive communication deficit  Dysarthria and anarthria    Problem  List Patient Active Problem List   Diagnosis Date Noted  . Acute ischemic stroke ([place]) 01/16/2018  . Acute CVA (cerebrovascular accident) ([place]) 01/16/2018  . GI bleed 07/10/2016  . Morbid obesity due to excess calories ([place]) 05/14/2016  . Dyspnea on exertion 04/29/2016  . Abnormal nuclear stress test   . CAD S/P percutaneous coronary angioplasty 02/08/2016  . INSOMNIA, CHRONIC 02/28/2010  . HEADACHE 02/28/2010  . HEEL PAIN, RIGHT 11/29/2009  . COUGH DUE TO ACE INHIBITORS 11/29/2009  . ATRIAL ENLARGEMENT, LEFT 03/21/2009  . ANEMIA 12/28/2008  . CARDIAC MURMUR 12/28/2008  . OTHER NONSPECIFIC ABNORMAL SERUM ENZYME LEVELS 12/28/2008  . CERVICAL MUSCLE STRAIN 12/28/2008  . CONSTIPATION 06/01/2008  . POSTMENOPAUSAL STATUS 05/15/2008  . BRONCHITIS, CHRONIC 12/18/2007  . Cigarette smoker 08/19/2007  . DEGENERATIVE DISC DISEASE, LUMBOSACRAL SPINE 08/19/2007  . LOW BACK PAIN SYNDROME 08/19/2007  . UPPER RESPIRATORY INFECTION, VIRAL 03/05/2007  . NEOPLASM, SKIN, UNCERTAIN BEHAVIOR 46/65/9935  . GANGLION CYST, WRIST, LEFT 01/28/2007  . DENTAL PAIN 01/19/2007  . DM (diabetes mellitus), type 2, uncontrolled with complications ([place]) 70/17/7939  . Hyperlipidemia with target LDL less than 70 12/10/2006  . Essential hypertension 12/10/2006  . RHINITIS, ALLERGIC NEC 12/10/2006  . HIP PAIN, RIGHT 12/10/2006    [facility] ,MS, CCC-SLP  03/03/2018, 4:59 PM  [facility] [address] [place], [place], [address] Phone: [phone]   Fax:  [phone]   Name: Leah Norton MRN: [id number] Date of Birth: [birth year]

## 2018-03-03 NOTE — Patient Instructions (Addendum)
   The person to call with your insurance questions would be Trudee Kuster - phone number  [phone]   - or Nickolas Madrid [phone]

## 2018-03-03 NOTE — Therapy (Signed)
[facility] [address] [place], [place], [address] Phone: [phone]   Fax:  [phone]  Occupational Therapy Treatment  Patient Details  Name: Leah Norton MRN: [id number] Date of Birth: 1955-03-25 Referring Provider (OT): Dr. Delfina Redwood   Encounter Date: 03/03/2018  OT End of Session - 03/03/18 1449    Visit Number  7    Number of Visits  25    Date for OT Re-Evaluation  04/12/18    Authorization Type  State BCBS    OT Start Time  1402    OT Stop Time  1444    OT Time Calculation (min)  42 min    Activity Tolerance  Patient tolerated treatment well       Past Medical History:  Diagnosis Date  . Anginal pain ([place])   . Arthritis    "right anikle" (02/29/2016)  . CAD in native artery 02/08/2016   Three-vessel coronary artery calcification on CT scan  . Complication of anesthesia    "had reaction w/hives, rash, itching" (02/29/2016)  . Constipation   . Coronary artery disease    12/17 PCI with DESx1 to pLAD EF normal  . Enlarged heart   . GERD (gastroesophageal reflux disease)   . High cholesterol   . Hypertension   . Migraine    hx  . Stroke ([place])   . Symptomatic anemia 07/10/2016  . Type II diabetes mellitus ([place])     Past Surgical History:  Procedure Laterality Date  . ABDOMINAL HYSTERECTOMY    . CARDIAC CATHETERIZATION N/A 02/29/2016   Procedure: Left Heart Cath and Coronary Angiography;  Surgeon: Belva Crome, MD;  Location: [facility];  Service: Cardiovascular;  Laterality: N/A;  . CARDIAC CATHETERIZATION N/A 02/29/2016   Procedure: Coronary Stent Intervention;  Surgeon: Belva Crome, MD;  Location: [facility];  Service: Cardiovascular;  Laterality: N/A;  . CARDIAC CATHETERIZATION  2005   "no blockages"  . COLONOSCOPY WITH PROPOFOL N/A 03/15/2014   Procedure: COLONOSCOPY WITH PROPOFOL;  Surgeon: Garlan Fair, MD;  Location: [facility];  Service: Endoscopy;   Laterality: N/A;  . CORONARY ANGIOPLASTY WITH STENT PLACEMENT    . ESOPHAGOGASTRODUODENOSCOPY N/A 07/11/2016   Procedure: ESOPHAGOGASTRODUODENOSCOPY (EGD);  Surgeon: Laurence Spates, MD;  Location: [facility] ENDOSCOPY;  Service: Endoscopy;  Laterality: N/A;  . FRACTURE SURGERY    . MULTIPLE TOOTH EXTRACTIONS     teeth removed  . ORIF ANKLE FRACTURE Right 11/12/2012   Procedure: OPEN REDUCTION INTERNAL FIXATION (ORIF) ANKLE FRACTURE;  Surgeon: Marianna Payment, MD;  Location: [facility];  Service: Orthopedics;  Laterality: Right;  Open reduction internal fixation ankle fracture, trimalleolar    There were no vitals filed for this visit.  Subjective Assessment - 03/03/18 1408    Pertinent History  R CVA    Patient Stated Goals  return to work    Currently in Pain?  No/denies                   OT Treatments/Exercises (OP) - 03/03/18 0001      Hand Exercises   Other Hand Exercises  Gripper on #2 to pick up 1 inch blocks and place in bowl - pt only had 2 drops.  Also addressed stacking and unstacking stacks of 3 to address sustained grip strength and control. Pt with maximual difficulty with this task.  Reduced resistance to #1 and pt with improved success with mod cues to slow down and focus on  control .  Incorporated working in busy envrionment as well as following multi step instructions for tasks.       Other Hand Exercises  Instructed pt in HEP for grip - pt was not fully engaging in hand when working on grip sterngth for HEP.  After instruction and practice pt able to return demonstrate.  Upgraded HEP to red putty.        Fine Motor Coordination (Hand/Wrist)   Small Pegboard  addressed pinch strength and fine motor via pegs in putty (red).               OT Education - 03/03/18 1442    Education Details  upgraded HEP for grip to red putty and instructed for more efficient use o fputty.      Person(s) Educated  Patient    Methods  Explanation;Verbal cues;Handout   updated hand out    Comprehension  Verbalized understanding;Returned demonstration       OT Short Term Goals - 02/25/18 1014      OT SHORT TERM GOAL #1   Title  I with inital HEP.    Period  Weeks    Status  On-going      OT SHORT TERM GOAL #2   Title  Pt will improve LUE grip strength by 10 lbs for increased LUE functional use during ADLS.    Baseline  RUE 32 lbs, LUE 10 lbs    Time  4    Period  Weeks    Status  On-going      OT SHORT TERM GOAL #3   Title  Pt will demonstrate improved LUE functional use for ADLSas evidenced by decreasing 9 hole peg test score to 30 secs or less.    Baseline  RUE 27.79 secs, LUE 35.28 secs    Time  4    Period  Weeks    Status  On-going      OT SHORT TERM GOAL #4   Title  Pt will demonstrate ability to perform basic cooking at a modified independent level demonstrating good safety awareness.    Time  4    Period  Weeks    Status  On-going      OT SHORT TERM GOAL #5   Title  Pt will demonstrate ability to maintain selective attention to a functional task in a busy environment x 30 mins without redirects.    Time  4    Period  Weeks        OT Long Term Goals - 02/11/18 1752      OT LONG TERM GOAL #1   Title  I with updated HEP.    Time  8    Period  Weeks    Status  New      OT LONG TERM GOAL #2   Title  Pt will demonstrate ability to retrieve a 3 lbs item from overhead shelf with LUE demonstrating good control.    Time  8    Period  Weeks    Status  New      OT LONG TERM GOAL #3   Title  Pt will demonstrate ability to perform a physical and cognitive task simultaneously with 90% or better accuracy in prep for driving and work activities.    Time  8    Period  Weeks    Status  New      OT LONG TERM GOAL #4   Title  Pt will perfrom simulated work activities at  a modified independent level.    Time  8    Period  Weeks    Status  New      OT LONG TERM GOAL #5   Title  Pt will perform mod complex home management at a modified independent  level.    Time  8    Period  Weeks    Status  New      Long Term Additional Goals   Additional Long Term Goals  Yes      OT LONG TERM GOAL #6   Title  Pt will demonstrate ability to perfrom mod complex cooking (ie: 2 items at 1 time )modified independently demonstrating good safety awareness.    Time  8    Period  Weeks    Status  New            Plan - 03/03/18 1448    Clinical Impression Statement  Pt requires cues to slow down and focus on control for LUE - performance improves when pt is able to do this    Occupational Profile and client history currently impacting functional performance  PMH: DM II, HTN, CAD s/p percutaneous angioplasty, hx of GI bleed, tobacco use, Pt was completely I and working full time as security at [employer], Pt is currently living with family and requires assistance for transportation.    Occupational performance deficits (Please refer to evaluation for details):  ADL's;IADL's;Work;Play;Leisure;Social Participation    Rehab Potential  Good    Current Impairments/barriers affecting progress:  cognitive deficits, impulsivity, decreased awareness, decreased coordination    OT Frequency  3x / week    OT Duration  8 weeks    OT Treatment/Interventions  Self-care/ADL training;Therapeutic exercise;Gait Training;Aquatic Therapy;Moist Heat;Paraffin;Neuromuscular education;Splinting;Patient/family education;Balance training;Therapeutic activities;Functional Mobility Training;Energy conservation;Fluidtherapy;Ultrasound;Cryotherapy;Contrast Bath;DME and/or AE instruction;Manual Therapy;Passive range of motion;Cognitive remediation/compensation    Plan  continue to address LUE coordination, stength and functional use with cognitive component    OT Home Exercise Plan  upgraded to red putty    Consulted and Agree with Plan of Care  Patient       Patient will benefit from skilled therapeutic intervention in order to improve the following deficits and  impairments:  Abnormal gait, Decreased mobility, Decreased coordination, Decreased range of motion, Decreased strength, Impaired UE functional use, Impaired perceived functional ability, Difficulty walking, Decreased safety awareness, Decreased knowledge of precautions, Decreased balance  Visit Diagnosis: Muscle weakness (generalized)  Frontal lobe and executive function deficit  Other lack of coordination  Unsteadiness on feet    Problem List Patient Active Problem List   Diagnosis Date Noted  . Acute ischemic stroke ([place]) 01/16/2018  . Acute CVA (cerebrovascular accident) ([place]) 01/16/2018  . GI bleed 07/10/2016  . Morbid obesity due to excess calories ([place]) 05/14/2016  . Dyspnea on exertion 04/29/2016  . Abnormal nuclear stress test   . CAD S/P percutaneous coronary angioplasty 02/08/2016  . INSOMNIA, CHRONIC 02/28/2010  . HEADACHE 02/28/2010  . HEEL PAIN, RIGHT 11/29/2009  . COUGH DUE TO ACE INHIBITORS 11/29/2009  . ATRIAL ENLARGEMENT, LEFT 03/21/2009  . ANEMIA 12/28/2008  . CARDIAC MURMUR 12/28/2008  . OTHER NONSPECIFIC ABNORMAL SERUM ENZYME LEVELS 12/28/2008  . CERVICAL MUSCLE STRAIN 12/28/2008  . CONSTIPATION 06/01/2008  . POSTMENOPAUSAL STATUS 05/15/2008  . BRONCHITIS, CHRONIC 12/18/2007  . Cigarette smoker 08/19/2007  . DEGENERATIVE DISC DISEASE, LUMBOSACRAL SPINE 08/19/2007  . LOW BACK PAIN SYNDROME 08/19/2007  . UPPER RESPIRATORY INFECTION, VIRAL 03/05/2007  . NEOPLASM, SKIN, UNCERTAIN BEHAVIOR 08/67/6195  .  GANGLION CYST, WRIST, LEFT 01/28/2007  . DENTAL PAIN 01/19/2007  . DM (diabetes mellitus), type 2, uncontrolled with complications ([place]) 82/41/7530  . Hyperlipidemia with target LDL less than 70 12/10/2006  . Essential hypertension 12/10/2006  . RHINITIS, ALLERGIC NEC 12/10/2006  . HIP PAIN, RIGHT 12/10/2006    Quay Burow, OTR/L 03/03/2018, 2:54 PM  [facility] [address] [place], [place], [address] Phone: [phone]   Fax:  [phone]  Name: SURI TAFOLLA MRN: [id number] Date of Birth: [birth year]

## 2018-03-03 NOTE — Therapy (Signed)
[facility] [address] [place], [place], [address] Phone: [phone]   Fax:  [phone]  Physical Therapy Treatment  Patient Details  Name: Leah Norton MRN: [id number] Date of Birth: [birth year] Referring Provider (PT): Seward Carol, MD   Encounter Date: 03/03/2018  PT End of Session - 03/03/18 1531    Visit Number  9    Number of Visits  23    Date for PT Re-Evaluation  05/12/18    Authorization Type  State BCBS    PT Start Time  3299    PT Stop Time  1532    PT Time Calculation (min)  45 min    Activity Tolerance  Patient tolerated treatment well;Other (comment)   pt needed several seated rest breaks   Behavior During Therapy  WFL for tasks assessed/performed       Past Medical History:  Diagnosis Date  . Anginal pain ([place])   . Arthritis    "right anikle" (02/29/2016)  . CAD in native artery 02/08/2016   Three-vessel coronary artery calcification on CT scan  . Complication of anesthesia    "had reaction w/hives, rash, itching" (02/29/2016)  . Constipation   . Coronary artery disease    12/17 PCI with DESx1 to pLAD EF normal  . Enlarged heart   . GERD (gastroesophageal reflux disease)   . High cholesterol   . Hypertension   . Migraine    hx  . Stroke ([place])   . Symptomatic anemia 07/10/2016  . Type II diabetes mellitus ([place])     Past Surgical History:  Procedure Laterality Date  . ABDOMINAL HYSTERECTOMY    . CARDIAC CATHETERIZATION N/A 02/29/2016   Procedure: Left Heart Cath and Coronary Angiography;  Surgeon: Belva Crome, MD;  Location: [facility];  Service: Cardiovascular;  Laterality: N/A;  . CARDIAC CATHETERIZATION N/A 02/29/2016   Procedure: Coronary Stent Intervention;  Surgeon: Belva Crome, MD;  Location: [facility];  Service: Cardiovascular;  Laterality: N/A;  . CARDIAC CATHETERIZATION  2005   "no blockages"  . COLONOSCOPY WITH PROPOFOL N/A 03/15/2014   Procedure: COLONOSCOPY WITH PROPOFOL;  Surgeon: Garlan Fair, MD;  Location: [facility];  Service: Endoscopy;  Laterality: N/A;  . CORONARY ANGIOPLASTY WITH STENT PLACEMENT    . ESOPHAGOGASTRODUODENOSCOPY N/A 07/11/2016   Procedure: ESOPHAGOGASTRODUODENOSCOPY (EGD);  Surgeon: Laurence Spates, MD;  Location: [facility];  Service: Endoscopy;  Laterality: N/A;  . FRACTURE SURGERY    . MULTIPLE TOOTH EXTRACTIONS     teeth removed  . ORIF ANKLE FRACTURE Right 11/12/2012   Procedure: OPEN REDUCTION INTERNAL FIXATION (ORIF) ANKLE FRACTURE;  Surgeon: Marianna Payment, MD;  Location: [facility];  Service: Orthopedics;  Laterality: Right;  Open reduction internal fixation ankle fracture, trimalleolar    There were no vitals filed for this visit.  Subjective Assessment - 03/03/18 1450    Subjective  Denies changes since last visit    Pertinent History  PMH includes essential HTN, hyperlipidemia, DM, DDD lumbo-sacral spine; Hx of CVA 01/15/18    Patient Stated Goals  Pt's goals for physical therapy are to do whatever I can to return to normal.    Currently in Pain?  No/denies       [facility]one taps, double taps and tipping/uprighting on noncompliant then compliant surface.   Up/down >20 steps with rail and supervision Seated on green disk on mat with marching, UE raises and LAQ.  Cues for trunk stability and neutral pelvis.  Seated on blue therapy ball at mat with PTA on opposite side for safety.  Worked on bouncing, weight shift forward/back, weight shift side to side, marching and arm raises.  Pt needed min assist to maintain balance and at times mod assist. Scifit level 2.5 all 4 extremities x 8 minutes for endurance and strengthening.  Needed moderate cues to prevent external rotation bil LE's on scifit.    PT Short Term Goals - 02/14/18 2057      PT SHORT TERM GOAL #1   Title  Pt will be independent with HEP for improved strength, balance, gait.  TARGET 03/14/18    Time  5    Period  Weeks      PT  SHORT TERM GOAL #2   Title  Pt will improve DGI score to at least 21/24 for decreased fall risk.    Time  5    Period  Weeks    Status  New      PT SHORT TERM GOAL #3   Title  Pt to improve 6 minute walk test goal by at least 150 ft for improved gait efficiency in community.    Baseline  02/13/18:  866 ft in 6MWT    Time  5    Period  Weeks    Status  Revised      PT SHORT TERM GOAL #4   Title  Pt will verbalize understanding of CVA warning signs and symptoms.    Time  5    Period  Weeks    Status  New        PT Long Term Goals - 02/11/18 1459      PT LONG TERM GOAL #1   Title  Pt will verbalize understanding of fall prevention in the home environment.  TARGET 04/11/18    Time  9    Period  Weeks    Status  New    Target Date  04/11/18      PT LONG TERM GOAL #2   Title  Pt will improve 5x sit<>stand score to less than or equal to 11.4 seconds for improved functional strength and transfer efficiency.    Time  9    Period  Weeks    Status  New    Target Date  04/11/18      PT LONG TERM GOAL #3   Title  Pt will improve gait velocity to at least 2.62 ft/sec for improved gait efficiency and safety.    Time  9    Period  Weeks    Status  New    Target Date  04/11/18      PT LONG TERM GOAL #4   Title  Pt will ambulate at least 1000 ft, indoor and outdoor surfaces, independently no LOB, for improved gait negotiation outdoor surfaces.    Time  9    Period  Weeks    Status  New    Target Date  04/11/18            Plan - 03/03/18 1532    Clinical Impression Statement  Skilled session focused on trunk control, endurance and balance.  Pt with slight improvement in trunk control today on compliant surface.  Continue PT per POC.    Rehab Potential  Good    PT Frequency  Other (comment)   1/wk in addition to eval, then 3x/wk for 4 weeks, 2x/wk for 4 weeks   PT Treatment/Interventions  ADLs/Self Care Home Management;Gait training;Functional mobility training;Therapeutic  activities;Therapeutic exercise;Balance training;Patient/family education;Neuromuscular re-education    PT Next Visit Plan  Continue trunk control activities; SLS activities, L step length/dorsiflexion/heelstrike    Consulted and Agree with Plan of Care  Patient       Patient will benefit from skilled therapeutic intervention in order to improve the following deficits and impairments:  Abnormal gait, Decreased balance, Decreased mobility, Difficulty walking, Decreased strength  Visit Diagnosis: Other lack of coordination  Other abnormalities of gait and mobility  Unsteadiness on feet  Muscle weakness (generalized)     Problem List Patient Active Problem List   Diagnosis Date Noted  . Acute ischemic stroke ([place]) 01/16/2018  . Acute CVA (cerebrovascular accident) ([place]) 01/16/2018  . GI bleed 07/10/2016  . Morbid obesity due to excess calories ([place]) 05/14/2016  . Dyspnea on exertion 04/29/2016  . Abnormal nuclear stress test   . CAD S/P percutaneous coronary angioplasty 02/08/2016  . INSOMNIA, CHRONIC 02/28/2010  . HEADACHE 02/28/2010  . HEEL PAIN, RIGHT 11/29/2009  . COUGH DUE TO ACE INHIBITORS 11/29/2009  . ATRIAL ENLARGEMENT, LEFT 03/21/2009  . ANEMIA 12/28/2008  . CARDIAC MURMUR 12/28/2008  . OTHER NONSPECIFIC ABNORMAL SERUM ENZYME LEVELS 12/28/2008  . CERVICAL MUSCLE STRAIN 12/28/2008  . CONSTIPATION 06/01/2008  . POSTMENOPAUSAL STATUS 05/15/2008  . BRONCHITIS, CHRONIC 12/18/2007  . Cigarette smoker 08/19/2007  . DEGENERATIVE DISC DISEASE, LUMBOSACRAL SPINE 08/19/2007  . LOW BACK PAIN SYNDROME 08/19/2007  . UPPER RESPIRATORY INFECTION, VIRAL 03/05/2007  . NEOPLASM, SKIN, UNCERTAIN BEHAVIOR 58/83/2549  . GANGLION CYST, WRIST, LEFT 01/28/2007  . DENTAL PAIN 01/19/2007  . DM (diabetes mellitus), type 2, uncontrolled with complications ([place]) 82/64/1583  . Hyperlipidemia with target LDL less than 70 12/10/2006  . Essential hypertension 12/10/2006  . RHINITIS, ALLERGIC  NEC 12/10/2006  . HIP PAIN, RIGHT 12/10/2006    Lesli Albee 03/03/2018, 4:32 PM  [facility] [address] [place], [place], [address] Phone: [phone]   Fax:  [phone]  Name: LARAMIE GELLES MRN: [id number] Date of Birth: 01-01-1956

## 2018-03-04 ENCOUNTER — Encounter: Payer: BC Managed Care – PPO | Admitting: Occupational Therapy

## 2018-03-04 ENCOUNTER — Ambulatory Visit: Payer: BC Managed Care – PPO

## 2018-03-04 DIAGNOSIS — R2681 Unsteadiness on feet: Secondary | ICD-10-CM

## 2018-03-04 DIAGNOSIS — R278 Other lack of coordination: Secondary | ICD-10-CM | POA: Diagnosis not present

## 2018-03-04 DIAGNOSIS — R471 Dysarthria and anarthria: Secondary | ICD-10-CM

## 2018-03-04 DIAGNOSIS — R2689 Other abnormalities of gait and mobility: Secondary | ICD-10-CM

## 2018-03-04 DIAGNOSIS — R41841 Cognitive communication deficit: Secondary | ICD-10-CM

## 2018-03-04 DIAGNOSIS — M6281 Muscle weakness (generalized): Secondary | ICD-10-CM

## 2018-03-04 NOTE — Therapy (Signed)
[facility] [address] [place], [place], [address] Phone: [phone]   Fax:  [phone]  Speech Language Pathology Treatment  Patient Details  Name: Leah Norton MRN: [id number] Date of Birth: [birth year] Referring Provider (SLP): Seward Carol, MD   Encounter Date: 03/04/2018  End of Session - 03/04/18 1205    Visit Number  10    Number of Visits  21    Date for SLP Re-Evaluation  04/12/18    SLP Start Time  0935    SLP Stop Time   1020    SLP Time Calculation (min)  45 min    Activity Tolerance  Patient tolerated treatment well       Past Medical History:  Diagnosis Date  . Anginal pain ([place])   . Arthritis    "right anikle" (02/29/2016)  . CAD in native artery 02/08/2016   Three-vessel coronary artery calcification on CT scan  . Complication of anesthesia    "had reaction w/hives, rash, itching" (02/29/2016)  . Constipation   . Coronary artery disease    12/17 PCI with DESx1 to pLAD EF normal  . Enlarged heart   . GERD (gastroesophageal reflux disease)   . High cholesterol   . Hypertension   . Migraine    hx  . Stroke ([place])   . Symptomatic anemia 07/10/2016  . Type II diabetes mellitus ([place])     Past Surgical History:  Procedure Laterality Date  . ABDOMINAL HYSTERECTOMY    . CARDIAC CATHETERIZATION N/A 02/29/2016   Procedure: Left Heart Cath and Coronary Angiography;  Surgeon: Belva Crome, MD;  Location: [facility];  Service: Cardiovascular;  Laterality: N/A;  . CARDIAC CATHETERIZATION N/A 02/29/2016   Procedure: Coronary Stent Intervention;  Surgeon: Belva Crome, MD;  Location: [facility];  Service: Cardiovascular;  Laterality: N/A;  . CARDIAC CATHETERIZATION  2005   "no blockages"  . COLONOSCOPY WITH PROPOFOL N/A 03/15/2014   Procedure: COLONOSCOPY WITH PROPOFOL;  Surgeon: Garlan Fair, MD;  Location: [facility];  Service: Endoscopy;  Laterality: N/A;  .  CORONARY ANGIOPLASTY WITH STENT PLACEMENT    . ESOPHAGOGASTRODUODENOSCOPY N/A 07/11/2016   Procedure: ESOPHAGOGASTRODUODENOSCOPY (EGD);  Surgeon: Laurence Spates, MD;  Location: [facility];  Service: Endoscopy;  Laterality: N/A;  . FRACTURE SURGERY    . MULTIPLE TOOTH EXTRACTIONS     teeth removed  . ORIF ANKLE FRACTURE Right 11/12/2012   Procedure: OPEN REDUCTION INTERNAL FIXATION (ORIF) ANKLE FRACTURE;  Surgeon: Marianna Payment, MD;  Location: [facility];  Service: Orthopedics;  Laterality: Right;  Open reduction internal fixation ankle fracture, trimalleolar    There were no vitals filed for this visit.  Subjective Assessment - 03/04/18 0952    Subjective  Pt in restroom at 0935. "I drove myself here today."            ADULT SLP TREATMENT - 03/04/18 0952      General Information   Behavior/Cognition  Alert;Cooperative;Pleasant mood      Treatment Provided   Treatment provided  Cognitive-Linquistic      Cognitive-Linquistic Treatment   Treatment focused on  Cognition    Skilled Treatment  Cognitive tx - given pt's "s" statement, SLP reviewed what SLP-Bardin shared with pt last week about suggesting about driving with supervision. SLP worked with pt attention, organization and awareness in a simple functional written money task. Pt demonstrated difficulty with organization and attention, and decr'd attention to detail as well as  decr'd error awareness. SLP highlighted these deficits/changes from PLOF to patient and dovetailed this with revisiting rationale spoken of last week re: supervision while driving/graduated driving schedule. Pt appeared to comprehend this rationale this time, when presented with a real-life and salient example of her deficit areas. SLP suggested pt drive on least-traveled roads for 15 minutes at a time until her passenger is comfortable with her driving, then to more populated/busy roads for 15 minutes and then longer time frames, and then adding more  populated/busy roads for short amounts of time. No nightime driving was recommended. SLP told pt she would need to trust the passenger to be honest with pt about pt's capability driving and how comfortable they were as her passenger.       Assessment / Recommendations / Plan   Plan  Continue with current plan of care      Progression Toward Goals   Progression toward goals  Progressing toward goals       SLP Education - 03/04/18 1204    Education Details  advise driving with supervision on low populated roads for short amounts of time, or not driving    Person(s) Educated  Patient    Methods  Explanation    Comprehension  Verbalized understanding       SLP Short Term Goals - 03/04/18 1207      SLP SHORT TERM GOAL #1   Title  Pt will respond with personally relevant sentences 18/20 with [facility] speech fluidity over three sessions with use of compensations (slow rate)    Status  Achieved      SLP SHORT TERM GOAL #2   Title  Pt will use a memory compensation system to record, reference and organize daily activities/appointments with occasional min A x 2 sessions     Period  Weeks    Status  Achieved      SLP SHORT TERM GOAL #3   Title  Pt will tell SLP 2 cognitive deficits that would impact her at work.    Status  Partially Met      SLP SHORT TERM GOAL #4   Title  Pt will demo attention to detail by recalling pertinent information from 3-5 sentence auditory messages 85% accuracy x 3 sessions (verbal/ written summary or responding to questions)    Status  Not Met       SLP Long Term Goals - 03/04/18 1208      SLP LONG TERM GOAL #1   Title  Using compensatory strategies for fluency/dysarthria, pt will functionally ask investigative questions or give a verbal report of functional scenarios over 4 sessions    Time  5    Period  Weeks    Status  On-going      SLP LONG TERM GOAL #2   Title  Pt will demo WFL fluidity of speech in 10 minutes mod to complex/complex conversation with  modified independence outside of ST room x 3 sessions    Time  5    Period  Weeks    Status  On-going      SLP LONG TERM GOAL #3   Title  pt will demo functional executive function (e.g., planning, organization) ability in personally relevant tasks (written and/or auditorily provided) 90% of the time     Time  5    Period  Weeks    Status  On-going      SLP LONG TERM GOAL #4   Title  Pt will demo 15 minutes alternating attention  between two mod complex cognitive tasks to achieve >85% success on each task over two sessions    Time  5    Period  Weeks    Status  On-going      SLP LONG TERM GOAL #5   Title  pt will demo WNL emergent awareness with mod complex tasks in order to achieve >95% success over three sessions     Time  5    Period  Weeks    Status  On-going       Plan - 03/04/18 1206    Clinical Impression Statement  Patient presents with moderate cognitive communication deficits, and mild dysarthria. No dysfluencies heard today in conversation. Impairments persist in selective attn, attn to detail, executive function, and awareness noted today, as well as impulsivity. Pt has returned to driving, per pt stating MD cleared her to drive. SLP cautioned pt today and suggested pt drive with supervision and discussed a graduated driving schedule if in fact pt HAS been cleared to drive.  Current deficits in attention, recall, and organization would also hinder her ability to complete job duties at this time, including completing investigations, dictation, and writing reports. I recommend skilled ST to maximize cognitive and communicative function for possible return to work and improved independence and QOL.    Speech Therapy Frequency  3x / week   3x per week for 4 weeks, then 2x per week for 4 weeks   Duration  --   8 weeks or 21 total visits   Treatment/Interventions  Cognitive reorganization;Multimodal communcation approach;Compensatory strategies;Compensatory techniques;Cueing  hierarchy;Internal/external aids;Functional tasks;SLP instruction and feedback;Patient/family education    Potential to Achieve Goals  Good       Patient will benefit from skilled therapeutic intervention in order to improve the following deficits and impairments:   Cognitive communication deficit  Dysarthria and anarthria    Problem List Patient Active Problem List   Diagnosis Date Noted  . Acute ischemic stroke ([place]) 01/16/2018  . Acute CVA (cerebrovascular accident) ([place]) 01/16/2018  . GI bleed 07/10/2016  . Morbid obesity due to excess calories ([place]) 05/14/2016  . Dyspnea on exertion 04/29/2016  . Abnormal nuclear stress test   . CAD S/P percutaneous coronary angioplasty 02/08/2016  . INSOMNIA, CHRONIC 02/28/2010  . HEADACHE 02/28/2010  . HEEL PAIN, RIGHT 11/29/2009  . COUGH DUE TO ACE INHIBITORS 11/29/2009  . ATRIAL ENLARGEMENT, LEFT 03/21/2009  . ANEMIA 12/28/2008  . CARDIAC MURMUR 12/28/2008  . OTHER NONSPECIFIC ABNORMAL SERUM ENZYME LEVELS 12/28/2008  . CERVICAL MUSCLE STRAIN 12/28/2008  . CONSTIPATION 06/01/2008  . POSTMENOPAUSAL STATUS 05/15/2008  . BRONCHITIS, CHRONIC 12/18/2007  . Cigarette smoker 08/19/2007  . DEGENERATIVE DISC DISEASE, LUMBOSACRAL SPINE 08/19/2007  . LOW BACK PAIN SYNDROME 08/19/2007  . UPPER RESPIRATORY INFECTION, VIRAL 03/05/2007  . NEOPLASM, SKIN, UNCERTAIN BEHAVIOR 27/78/2423  . GANGLION CYST, WRIST, LEFT 01/28/2007  . DENTAL PAIN 01/19/2007  . DM (diabetes mellitus), type 2, uncontrolled with complications ([place]) 53/61/4431  . Hyperlipidemia with target LDL less than 70 12/10/2006  . Essential hypertension 12/10/2006  . RHINITIS, ALLERGIC NEC 12/10/2006  . HIP PAIN, RIGHT 12/10/2006    [facility] ,MS, [place]  03/04/2018, 12:09 PM  [facility] [address] [place], [place], [address] Phone: [phone]   Fax:  [phone]   Name: Leah Norton MRN:  [id number] Date of Birth: [birth year]

## 2018-03-04 NOTE — Therapy (Signed)
[facility] [address] [place], [place], [address] Phone: [phone]   Fax:  [phone]  Physical Therapy Treatment  Patient Details  Name: Leah Norton MRN: [id number] Date of Birth: 1956/02/11 Referring Provider (PT): Seward Carol, MD   Encounter Date: 03/04/2018  PT End of Session - 03/04/18 0910    Visit Number  10    Number of Visits  23    Date for PT Re-Evaluation  05/12/18    Authorization Type  State BCBS    PT Start Time  [phone]   Pt late to appt.    PT Stop Time  0930    PT Time Calculation (min)  26 min    Equipment Utilized During Treatment  Gait belt    Activity Tolerance  Patient tolerated treatment well;Other (comment)   pt needed several seated rest breaks   Behavior During Therapy  WFL for tasks assessed/performed       Past Medical History:  Diagnosis Date  . Anginal pain ([place])   . Arthritis    "right anikle" (02/29/2016)  . CAD in native artery 02/08/2016   Three-vessel coronary artery calcification on CT scan  . Complication of anesthesia    "had reaction w/hives, rash, itching" (02/29/2016)  . Constipation   . Coronary artery disease    12/17 PCI with DESx1 to pLAD EF normal  . Enlarged heart   . GERD (gastroesophageal reflux disease)   . High cholesterol   . Hypertension   . Migraine    hx  . Stroke ([place])   . Symptomatic anemia 07/10/2016  . Type II diabetes mellitus ([place])     Past Surgical History:  Procedure Laterality Date  . ABDOMINAL HYSTERECTOMY    . CARDIAC CATHETERIZATION N/A 02/29/2016   Procedure: Left Heart Cath and Coronary Angiography;  Surgeon: Belva Crome, MD;  Location: [facility];  Service: Cardiovascular;  Laterality: N/A;  . CARDIAC CATHETERIZATION N/A 02/29/2016   Procedure: Coronary Stent Intervention;  Surgeon: Belva Crome, MD;  Location: [facility];  Service: Cardiovascular;  Laterality: N/A;  . CARDIAC CATHETERIZATION   2005   "no blockages"  . COLONOSCOPY WITH PROPOFOL N/A 03/15/2014   Procedure: COLONOSCOPY WITH PROPOFOL;  Surgeon: Garlan Fair, MD;  Location: [facility];  Service: Endoscopy;  Laterality: N/A;  . CORONARY ANGIOPLASTY WITH STENT PLACEMENT    . ESOPHAGOGASTRODUODENOSCOPY N/A 07/11/2016   Procedure: ESOPHAGOGASTRODUODENOSCOPY (EGD);  Surgeon: Laurence Spates, MD;  Location: [facility];  Service: Endoscopy;  Laterality: N/A;  . FRACTURE SURGERY    . MULTIPLE TOOTH EXTRACTIONS     teeth removed  . ORIF ANKLE FRACTURE Right 11/12/2012   Procedure: OPEN REDUCTION INTERNAL FIXATION (ORIF) ANKLE FRACTURE;  Surgeon: Marianna Payment, MD;  Location: [facility];  Service: Orthopedics;  Laterality: Right;  Open reduction internal fixation ankle fracture, trimalleolar    There were no vitals filed for this visit.  Subjective Assessment - 03/04/18 0905    Subjective  No falls to report, HEP is going well.     Pertinent History  PMH includes essential HTN, hyperlipidemia, DM, DDD lumbo-sacral spine; Hx of CVA 01/15/18    Patient Stated Goals  Pt's goals for physical therapy are to do whatever I can to return to normal.    Currently in Pain?  Yes    Pain Score  4     Pain Location  Ankle    Pain Orientation  Right  Pain Descriptors / Indicators  Aching    Pain Type  Acute pain    Pain Onset  Today    Pain Frequency  Intermittent    Aggravating Factors   walking     Pain Relieving Factors  Takes tylenol and soaks with epsom salt when it gets bad.        [facility] Adult PT Treatment/Exercise - 03/04/18 0935      Ambulation/Gait   Ambulation/Gait  Yes    Ambulation/Gait Assistance  5: Supervision    Ambulation/Gait Assistance Details  Attempted 19min walk test practice run to achieve goal next week, pt required no rest breaks, minimal increase in pain and mod deviation from path. VC's for safety awareness.     Ambulation Distance (Feet)  830 Feet    Assistive device  None    Gait Pattern   Step-through pattern;Decreased step length - left;Decreased trunk rotation;Decreased arm swing - left;Poor foot clearance - left;Decreased dorsiflexion - left    Ambulation Surface  Level;Indoor      High Level Balance   High Level Balance Activities  Side stepping;Negotiating over obstacles    High Level Balance Comments  Pt negotiating over foam beams with progression to orange hurdles fwds/side stepping with VC's for pacing and proper technique.      Neuro Re-ed    Neuro Re-ed Details   Pt in parallel bars on blue foam beam performing heel/center/toe taps progressing from BUE support to no UE support/min assist with gait belt, Pt required mod VC's for instruction, sequence, safety awareness when performing tasks.        PT Short Term Goals - 02/14/18 2057      PT SHORT TERM GOAL #1   Title  Pt will be independent with HEP for improved strength, balance, gait.  TARGET 03/14/18    Time  5    Period  Weeks      PT SHORT TERM GOAL #2   Title  Pt will improve DGI score to at least 21/24 for decreased fall risk.    Time  5    Period  Weeks    Status  New      PT SHORT TERM GOAL #3   Title  Pt to improve 6 minute walk test goal by at least 150 ft for improved gait efficiency in community.    Baseline  02/13/18:  866 ft in 6MWT    Time  5    Period  Weeks    Status  Revised      PT SHORT TERM GOAL #4   Title  Pt will verbalize understanding of CVA warning signs and symptoms.    Time  5    Period  Weeks    Status  New        PT Long Term Goals - 02/11/18 1459      PT LONG TERM GOAL #1   Title  Pt will verbalize understanding of fall prevention in the home environment.  TARGET 04/11/18    Time  9    Period  Weeks    Status  New    Target Date  04/11/18      PT LONG TERM GOAL #2   Title  Pt will improve 5x sit<>stand score to less than or equal to 11.4 seconds for improved functional strength and transfer efficiency.    Time  9    Period  Weeks    Status  New    Target  Date  04/11/18      PT LONG TERM GOAL #3   Title  Pt will improve gait velocity to at least 2.62 ft/sec for improved gait efficiency and safety.    Time  9    Period  Weeks    Status  New    Target Date  04/11/18      PT LONG TERM GOAL #4   Title  Pt will ambulate at least 1000 ft, indoor and outdoor surfaces, independently no LOB, for improved gait negotiation outdoor surfaces.    Time  9    Period  Weeks    Status  New    Target Date  04/11/18            Plan - 03/04/18 8295    Clinical Impression Statement  Todays skilled session focused on gait training to progress towards STG and high level balance with mod cues required for instruction; presented with cognitive difficulties. Short session due to pt being late to appointment. Pt should benefit from PT sessions to progress towards goals.     Rehab Potential  Good    PT Frequency  Other (comment)   1/wk in addition to eval, then 3x/wk for 4 weeks, 2x/wk for 4 weeks   PT Treatment/Interventions  ADLs/Self Care Home Management;Gait training;Functional mobility training;Therapeutic activities;Therapeutic exercise;Balance training;Patient/family education;Neuromuscular re-education    PT Next Visit Plan  Continue trunk control activities; SLS activities, L step length/dorsiflexion/heelstrike    Consulted and Agree with Plan of Care  Patient       Patient will benefit from skilled therapeutic intervention in order to improve the following deficits and impairments:  Abnormal gait, Decreased balance, Decreased mobility, Difficulty walking, Decreased strength  Visit Diagnosis: Cognitive communication deficit  Muscle weakness (generalized)  Other lack of coordination  Unsteadiness on feet  Other abnormalities of gait and mobility     Problem List Patient Active Problem List   Diagnosis Date Noted  . Acute ischemic stroke ([place]) 01/16/2018  . Acute CVA (cerebrovascular accident) ([place]) 01/16/2018  . GI bleed 07/10/2016   . Morbid obesity due to excess calories ([place]) 05/14/2016  . Dyspnea on exertion 04/29/2016  . Abnormal nuclear stress test   . CAD S/P percutaneous coronary angioplasty 02/08/2016  . INSOMNIA, CHRONIC 02/28/2010  . HEADACHE 02/28/2010  . HEEL PAIN, RIGHT 11/29/2009  . COUGH DUE TO ACE INHIBITORS 11/29/2009  . ATRIAL ENLARGEMENT, LEFT 03/21/2009  . ANEMIA 12/28/2008  . CARDIAC MURMUR 12/28/2008  . OTHER NONSPECIFIC ABNORMAL SERUM ENZYME LEVELS 12/28/2008  . CERVICAL MUSCLE STRAIN 12/28/2008  . CONSTIPATION 06/01/2008  . POSTMENOPAUSAL STATUS 05/15/2008  . BRONCHITIS, CHRONIC 12/18/2007  . Cigarette smoker 08/19/2007  . DEGENERATIVE DISC DISEASE, LUMBOSACRAL SPINE 08/19/2007  . LOW BACK PAIN SYNDROME 08/19/2007  . UPPER RESPIRATORY INFECTION, VIRAL 03/05/2007  . NEOPLASM, SKIN, UNCERTAIN BEHAVIOR 62/13/0865  . GANGLION CYST, WRIST, LEFT 01/28/2007  . DENTAL PAIN 01/19/2007  . DM (diabetes mellitus), type 2, uncontrolled with complications ([place]) 78/46/9629  . Hyperlipidemia with target LDL less than 70 12/10/2006  . Essential hypertension 12/10/2006  . RHINITIS, ALLERGIC NEC 12/10/2006  . HIP PAIN, RIGHT 12/10/2006   Leah Norton, PTA  Leah Norton 03/04/2018, 9:58 AM  [facility] [address] [place], [place], [address] Phone: [phone]   Fax:  [phone]  Name: Leah Norton MRN: [id number] Date of Birth: 1956/03/06

## 2018-03-07 ENCOUNTER — Ambulatory Visit: Payer: BC Managed Care – PPO | Admitting: Physical Therapy

## 2018-03-07 ENCOUNTER — Ambulatory Visit: Payer: BC Managed Care – PPO | Admitting: Occupational Therapy

## 2018-03-07 ENCOUNTER — Encounter: Payer: Self-pay | Admitting: Physical Therapy

## 2018-03-07 ENCOUNTER — Ambulatory Visit: Payer: BC Managed Care – PPO

## 2018-03-07 DIAGNOSIS — R278 Other lack of coordination: Secondary | ICD-10-CM

## 2018-03-07 DIAGNOSIS — R2689 Other abnormalities of gait and mobility: Secondary | ICD-10-CM

## 2018-03-07 DIAGNOSIS — R41844 Frontal lobe and executive function deficit: Secondary | ICD-10-CM

## 2018-03-07 DIAGNOSIS — R41841 Cognitive communication deficit: Secondary | ICD-10-CM

## 2018-03-07 DIAGNOSIS — R471 Dysarthria and anarthria: Secondary | ICD-10-CM

## 2018-03-07 DIAGNOSIS — R2681 Unsteadiness on feet: Secondary | ICD-10-CM

## 2018-03-07 DIAGNOSIS — M6281 Muscle weakness (generalized): Secondary | ICD-10-CM

## 2018-03-07 NOTE — Therapy (Signed)
[facility] [address] [place], [place], [address] Phone: [phone]   Fax:  [phone]  Speech Language Pathology Treatment  Patient Details  Name: Leah Norton MRN: [id number] Date of Birth: 11/30/1955 Referring Provider (SLP): Seward Carol, MD   Encounter Date: 03/07/2018  End of Session - 03/07/18 0927    Visit Number  11    Number of Visits  21    Date for SLP Re-Evaluation  04/12/18    SLP Start Time  0808    SLP Stop Time   0848    SLP Time Calculation (min)  40 min    Activity Tolerance  Patient tolerated treatment well       Past Medical History:  Diagnosis Date  . Anginal pain ([place])   . Arthritis    "right anikle" (02/29/2016)  . CAD in native artery 02/08/2016   Three-vessel coronary artery calcification on CT scan  . Complication of anesthesia    "had reaction w/hives, rash, itching" (02/29/2016)  . Constipation   . Coronary artery disease    12/17 PCI with DESx1 to pLAD EF normal  . Enlarged heart   . GERD (gastroesophageal reflux disease)   . High cholesterol   . Hypertension   . Migraine    hx  . Stroke ([place])   . Symptomatic anemia 07/10/2016  . Type II diabetes mellitus ([place])     Past Surgical History:  Procedure Laterality Date  . ABDOMINAL HYSTERECTOMY    . CARDIAC CATHETERIZATION N/A 02/29/2016   Procedure: Left Heart Cath and Coronary Angiography;  Surgeon: Belva Crome, MD;  Location: [facility];  Service: Cardiovascular;  Laterality: N/A;  . CARDIAC CATHETERIZATION N/A 02/29/2016   Procedure: Coronary Stent Intervention;  Surgeon: Belva Crome, MD;  Location: [facility];  Service: Cardiovascular;  Laterality: N/A;  . CARDIAC CATHETERIZATION  2005   "no blockages"  . COLONOSCOPY WITH PROPOFOL N/A 03/15/2014   Procedure: COLONOSCOPY WITH PROPOFOL;  Surgeon: Garlan Fair, MD;  Location: [facility];  Service: Endoscopy;  Laterality: N/A;  .  CORONARY ANGIOPLASTY WITH STENT PLACEMENT    . ESOPHAGOGASTRODUODENOSCOPY N/A 07/11/2016   Procedure: ESOPHAGOGASTRODUODENOSCOPY (EGD);  Surgeon: Laurence Spates, MD;  Location: [facility] ENDOSCOPY;  Service: Endoscopy;  Laterality: N/A;  . FRACTURE SURGERY    . MULTIPLE TOOTH EXTRACTIONS     teeth removed  . ORIF ANKLE FRACTURE Right 11/12/2012   Procedure: OPEN REDUCTION INTERNAL FIXATION (ORIF) ANKLE FRACTURE;  Surgeon: Marianna Payment, MD;  Location: [facility];  Service: Orthopedics;  Laterality: Right;  Open reduction internal fixation ankle fracture, trimalleolar    There were no vitals filed for this visit.  Subjective Assessment - 03/07/18 0859    Subjective  Pt drove herself here today. 7 minutes late.    Currently in Pain?  No/denies            ADULT SLP TREATMENT - 03/07/18 0859      General Information   Behavior/Cognition  Alert;Cooperative;Pleasant mood;Distractible      Treatment Provided   Treatment provided  Cognitive-Linquistic      Cognitive-Linquistic Treatment   Treatment focused on  Cognition    Skilled Treatment  "Had a lot of things go on with the holidays. I didn't do my work." Given pt's repeated cautions by SLP about driving independently, SLP used pt's completion of her HEP for dysarthria as another example why pt should not be driving independently at this time.  SLP highlighted pt's reduced sustained/selective attention (could not track number of repetitions without using paper/pencil), alternating attention (could not appropriately tally the number of reps with a tally mark between each rep), and emergent awareness (was not aware of inability to track rep numbers or that she was skipping exercises.). SLP made a exercise chart for pt and explained how to use (pt checks off each exercise as she completes it). Expect pt will require review/retraining  how to use next sessionand possibly in subsequent sessions.       Assessment / Recommendations / Plan   Plan  Continue  with current plan of care      Progression Toward Goals   Progression toward goals  Not progressing toward goals (comment)   decr'd awareness/attention hinders progress with HEP      SLP Education - 03/07/18 0926    Education Details  Pt should not be driving independently - should be driving supervised on low-populated neighborhood roads for no greater than 10 minutes    Person(s) Educated  Patient    Methods  Explanation    Comprehension  Verbalized understanding;Need further instruction       SLP Short Term Goals - 03/04/18 1207      SLP SHORT TERM GOAL #1   Title  Pt will respond with personally relevant sentences 18/20 with [facility] speech fluidity over three sessions with use of compensations (slow rate)    Status  Achieved      SLP SHORT TERM GOAL #2   Title  Pt will use a memory compensation system to record, reference and organize daily activities/appointments with occasional min A x 2 sessions     Period  Weeks    Status  Achieved      SLP SHORT TERM GOAL #3   Title  Pt will tell SLP 2 cognitive deficits that would impact her at work.    Status  Partially Met      SLP SHORT TERM GOAL #4   Title  Pt will demo attention to detail by recalling pertinent information from 3-5 sentence auditory messages 85% accuracy x 3 sessions (verbal/ written summary or responding to questions)    Status  Not Met       SLP Long Term Goals - 03/07/18 0929      SLP LONG TERM GOAL #1   Title  Using compensatory strategies for fluency/dysarthria, pt will functionally ask investigative questions or give a verbal report of functional scenarios over 4 sessions    Time  4    Period  Weeks    Status  On-going      SLP LONG TERM GOAL #2   Title  Pt will demo WFL fluidity of speech in 10 minutes mod to complex/complex conversation with modified independence outside of ST room x 3 sessions    Time  4    Period  Weeks    Status  On-going      SLP LONG TERM GOAL #3   Title  pt will demo  functional executive function (e.g., planning, organization) ability in personally relevant tasks (written and/or auditorily provided) 90% of the time     Time  4    Period  Weeks    Status  On-going      SLP LONG TERM GOAL #4   Title  Pt will demo 15 minutes alternating attention between two mod complex cognitive tasks to achieve >85% success on each task over two sessions    Time  4  Period  Weeks    Status  On-going      SLP LONG TERM GOAL #5   Title  pt will demo WNL emergent awareness with mod complex tasks in order to achieve >95% success over three sessions     Time  4    Period  Weeks    Status  On-going       Plan - 03/07/18 3875    Clinical Impression Statement  Patient presents with moderate cognitive communication deficits, and mild dysarthria. No dysfluencies heard today in conversation. Impairments persist in selective attn, attn to detail, executive function, and awareness noted today, as well as impulsivity. Pt continues to drive unsupervised so SLP again reminded pt that SLPs have recommended pt only drive with supervision on low-travelled neighborhood roads for <10 minutes at a time. Current deficits in attention, recall, and organization would also hinder her ability to complete job duties at this time, including completing investigations, dictation, and writing reports. I recommend skilled ST to maximize cognitive and communicative function for possible return to work and improved independence and QOL.    Speech Therapy Frequency  3x / week   3x per week for 4 weeks, then 2x per week for 4 weeks   Duration  --   8 weeks or 21 total visits   Treatment/Interventions  Cognitive reorganization;Multimodal communcation approach;Compensatory strategies;Compensatory techniques;Cueing hierarchy;Internal/external aids;Functional tasks;SLP instruction and feedback;Patient/family education    Potential to Achieve Goals  Good       Patient will benefit from skilled therapeutic  intervention in order to improve the following deficits and impairments:   Cognitive communication deficit  Dysarthria and anarthria    Problem List Patient Active Problem List   Diagnosis Date Noted  . Acute ischemic stroke ([place]) 01/16/2018  . Acute CVA (cerebrovascular accident) ([place]) 01/16/2018  . GI bleed 07/10/2016  . Morbid obesity due to excess calories ([place]) 05/14/2016  . Dyspnea on exertion 04/29/2016  . Abnormal nuclear stress test   . CAD S/P percutaneous coronary angioplasty 02/08/2016  . INSOMNIA, CHRONIC 02/28/2010  . HEADACHE 02/28/2010  . HEEL PAIN, RIGHT 11/29/2009  . COUGH DUE TO ACE INHIBITORS 11/29/2009  . ATRIAL ENLARGEMENT, LEFT 03/21/2009  . ANEMIA 12/28/2008  . CARDIAC MURMUR 12/28/2008  . OTHER NONSPECIFIC ABNORMAL SERUM ENZYME LEVELS 12/28/2008  . CERVICAL MUSCLE STRAIN 12/28/2008  . CONSTIPATION 06/01/2008  . POSTMENOPAUSAL STATUS 05/15/2008  . BRONCHITIS, CHRONIC 12/18/2007  . Cigarette smoker 08/19/2007  . DEGENERATIVE DISC DISEASE, LUMBOSACRAL SPINE 08/19/2007  . LOW BACK PAIN SYNDROME 08/19/2007  . UPPER RESPIRATORY INFECTION, VIRAL 03/05/2007  . NEOPLASM, SKIN, UNCERTAIN BEHAVIOR 64/33/2951  . GANGLION CYST, WRIST, LEFT 01/28/2007  . DENTAL PAIN 01/19/2007  . DM (diabetes mellitus), type 2, uncontrolled with complications ([place]) 88/41/6606  . Hyperlipidemia with target LDL less than 70 12/10/2006  . Essential hypertension 12/10/2006  . RHINITIS, ALLERGIC NEC 12/10/2006  . HIP PAIN, RIGHT 12/10/2006    [facility] ,[place], [place]  03/07/2018, 9:30 AM  [facility] [address] [place], [place], [address] Phone: [phone]   Fax:  [phone]   Name: SMT LOKEY MRN: [id number] Date of Birth: 03-Feb-1956

## 2018-03-07 NOTE — Therapy (Signed)
[facility] [address] [employer] [place], [place], [address] Phone: [phone]   Fax:  [phone]  Occupational Therapy Treatment  Patient Details  Name: Leah Norton MRN: [id number] Date of Birth: 1955-12-27 Referring Provider (OT): Dr. Delfina Redwood   Encounter Date: 03/07/2018  OT End of Session - 03/07/18 1042    Visit Number  8    Number of Visits  25    Date for OT Re-Evaluation  04/12/18    Authorization Type  State BCBS    OT Start Time  1020    OT Stop Time  1100    OT Time Calculation (min)  40 min    Activity Tolerance  Patient tolerated treatment well    Behavior During Therapy  [facility] for tasks assessed/performed       Past Medical History:  Diagnosis Date  . Anginal pain ([place])   . Arthritis    "right anikle" (02/29/2016)  . CAD in native artery 02/08/2016   Three-vessel coronary artery calcification on CT scan  . Complication of anesthesia    "had reaction w/hives, rash, itching" (02/29/2016)  . Constipation   . Coronary artery disease    12/17 PCI with DESx1 to pLAD EF normal  . Enlarged heart   . GERD (gastroesophageal reflux disease)   . High cholesterol   . Hypertension   . Migraine    hx  . Stroke ([place])   . Symptomatic anemia 07/10/2016  . Type II diabetes mellitus ([place])     Past Surgical History:  Procedure Laterality Date  . ABDOMINAL HYSTERECTOMY    . CARDIAC CATHETERIZATION N/A 02/29/2016   Procedure: Left Heart Cath and Coronary Angiography;  Surgeon: Belva Crome, MD;  Location: [facility];  Service: Cardiovascular;  Laterality: N/A;  . CARDIAC CATHETERIZATION N/A 02/29/2016   Procedure: Coronary Stent Intervention;  Surgeon: Belva Crome, MD;  Location: [facility];  Service: Cardiovascular;  Laterality: N/A;  . CARDIAC CATHETERIZATION  2005   "no blockages"  . COLONOSCOPY WITH PROPOFOL N/A 03/15/2014   Procedure: COLONOSCOPY WITH PROPOFOL;  Surgeon: Garlan Fair, MD;  Location: [facility]OS[facility];  Service: Endoscopy;  Laterality: N/A;  . CORONARY ANGIOPLASTY WITH STENT PLACEMENT    . ESOPHAGOGASTRODUODENOSCOPY N/A 07/11/2016   Procedure: ESOPHAGOGASTRODUODENOSCOPY (EGD);  Surgeon: Laurence Spates, MD;  [facility]: [facility] ENDOSCOPY;  Service: Endoscopy;  Laterality: N/A;  . FRACTURE SURGERY    . MULTIPLE TOOTH EXTRACTIONS     teeth removed  . ORIF ANKLE FRACTURE Right 11/12/2012   Procedure: OPEN REDUCTION INTERNAL FIXATION (ORIF) ANKLE FRACTURE;  Surgeon: Marianna Payment, MD;  Location: [facility];  Service: Orthopedics;  Laterality: Right;  Open reduction internal fixation ankle fracture, trimalleolar    There were no vitals filed for this visit.  Subjective Assessment - 03/07/18 1027    Subjective   denies pain    Pertinent History  R CVA    Patient Stated Goals  return to work    Currently in Pain?  No/denies           Treatment: Shoulder felxion with bilateral UE's and 2 lbs ball, min v.c for control. Reviewed red theraband HEP and added biceps curls and triceps extension, min-mod v.c and pt returned demonstration 10 reps each, reviewed previous theraband exercises Gripper set at level 2 to stack 1 inch blocks then to pick them up and place in container, several rest breaks required, min v.c Therapist checked goals for grip and  9 hole peg test, pt met goals.                OT Education - 03/07/18 1039    Education Details  Reviewed recommendation that pt does not drive yet due to cognitive deficits which might impede her safety, pt reports she was cleared by MD office    Person(s) Educated  Patient    Methods  Explanation;Verbal cues    Comprehension  --   Pt does not agree with recommendation as she reports she was cleared by Janett Billow from neurology      OT Short Term Goals - 03/07/18 1043      OT SHORT TERM GOAL #1   Title  I with inital HEP.    Status  Achieved      OT SHORT TERM GOAL #2   Title  Pt will improve LUE grip  strength by 10 lbs for increased LUE functional use during ADLS.    Status  Achieved   28 lbs and 32 lbs     OT SHORT TERM GOAL #3   Title  Pt will demonstrate improved LUE functional use for ADLSas evidenced by decreasing 9 hole peg test score to 30 secs or less.    Status  Achieved   29.34 sec     OT SHORT TERM GOAL #4   Title  Pt will demonstrate ability to perform basic cooking at a modified independent level demonstrating good safety awareness.    Status  Achieved      OT SHORT TERM GOAL #5   Title  Pt will demonstrate ability to maintain selective attention to a functional task in a busy environment x 30 mins without redirects.    Status  On-going        OT Long Term Goals - 02/11/18 1752      OT LONG TERM GOAL #1   Title  I with updated HEP.    Time  8    Period  Weeks    Status  New      OT LONG TERM GOAL #2   Title  Pt will demonstrate ability to retrieve a 3 lbs item from overhead shelf with LUE demonstrating good control.    Time  8    Period  Weeks    Status  New      OT LONG TERM GOAL #3   Title  Pt will demonstrate ability to perform a physical and cognitive task simultaneously with 90% or better accuracy in prep for driving and work activities.    Time  8    Period  Weeks    Status  New      OT LONG TERM GOAL #4   Title  Pt will perfrom simulated work activities at a modified independent level.    Time  8    Period  Weeks    Status  New      OT LONG TERM GOAL #5   Title  Pt will perform mod complex home management at a modified independent level.    Time  8    Period  Weeks    Status  New      Long Term Additional Goals   Additional Long Term Goals  Yes      OT LONG TERM GOAL #6   Title  Pt will demonstrate ability to perfrom mod complex cooking (ie: 2 items at 1 time )modified independently demonstrating good safety awareness.    Time  8  Period  Weeks    Status  New            Plan - 03/07/18 1523    Clinical Impression Statement   Pt is progressing towards goals with improving LUE control. Pt requires verbal cues initally then she demonstrates improved control.    Occupational Profile and client history currently impacting functional performance  PMH: DM II, HTN, CAD s/p percutaneous angioplasty, hx of GI bleed, tobacco use, Pt was completely I and working full time as [employer], Pt is currently living with family and requires assistance for transportation.    Occupational performance deficits (Please refer to evaluation for details):  ADL's;IADL's;Work;Play;Leisure;Social Participation    Rehab Potential  Good    Current Impairments/barriers affecting progress:  cognitive deficits, impulsivity, decreased awareness, decreased coordination    OT Frequency  3x / week    OT Duration  8 weeks    OT Treatment/Interventions  Self-care/ADL training;Therapeutic exercise;Gait Training;Aquatic Therapy;Moist Heat;Paraffin;Neuromuscular education;Splinting;Patient/family education;Balance training;Therapeutic activities;Functional Mobility Training;Energy conservation;Fluidtherapy;Ultrasound;Cryotherapy;Contrast Bath;DME and/or AE instruction;Manual Therapy;Passive range of motion;Cognitive remediation/compensation    Plan  continue to check short term goals, LUE strength, control and functional use.    Consulted and Agree with Plan of Care  Patient       Patient will benefit from skilled therapeutic intervention in order to improve the following deficits and impairments:  Abnormal gait, Decreased mobility, Decreased coordination, Decreased range of motion, Decreased strength, Impaired UE functional use, Impaired perceived functional ability, Difficulty walking, Decreased safety awareness, Decreased knowledge of precautions, Decreased balance  Visit Diagnosis: Muscle weakness (generalized)  Other lack of coordination  Frontal lobe and executive function deficit    Problem List Patient Active Problem List    Diagnosis Date Noted  . Acute ischemic stroke ([place]) 01/16/2018  . Acute CVA (cerebrovascular accident) ([place]) 01/16/2018  . GI bleed 07/10/2016  . Morbid obesity due to excess calories ([place]) 05/14/2016  . Dyspnea on exertion 04/29/2016  . Abnormal nuclear stress test   . CAD S/P percutaneous coronary angioplasty 02/08/2016  . INSOMNIA, CHRONIC 02/28/2010  . HEADACHE 02/28/2010  . HEEL PAIN, RIGHT 11/29/2009  . COUGH DUE TO ACE INHIBITORS 11/29/2009  . ATRIAL ENLARGEMENT, LEFT 03/21/2009  . ANEMIA 12/28/2008  . CARDIAC MURMUR 12/28/2008  . OTHER NONSPECIFIC ABNORMAL SERUM ENZYME LEVELS 12/28/2008  . CERVICAL MUSCLE STRAIN 12/28/2008  . CONSTIPATION 06/01/2008  . POSTMENOPAUSAL STATUS 05/15/2008  . BRONCHITIS, CHRONIC 12/18/2007  . Cigarette smoker 08/19/2007  . DEGENERATIVE DISC DISEASE, LUMBOSACRAL SPINE 08/19/2007  . LOW BACK PAIN SYNDROME 08/19/2007  . UPPER RESPIRATORY INFECTION, VIRAL 03/05/2007  . NEOPLASM, SKIN, UNCERTAIN BEHAVIOR 62/83/1517  . GANGLION CYST, WRIST, LEFT 01/28/2007  . DENTAL PAIN 01/19/2007  . DM (diabetes mellitus), type 2, uncontrolled with complications ([place]) 61/60/7371  . Hyperlipidemia with target LDL less than 70 12/10/2006  . Essential hypertension 12/10/2006  . RHINITIS, ALLERGIC NEC 12/10/2006  . HIP PAIN, RIGHT 12/10/2006    RINE,KATHRYN 03/07/2018, 3:25 PM  [facility] [address] [place], [place], [address] Phone: [phone]   Fax:  [phone]  Name: AHANA NAJERA MRN: [id number] Date of Birth: 08-05-1955

## 2018-03-07 NOTE — Patient Instructions (Signed)
   Elbow Flexion: Resisted   With tubing held in _left_____ hand(s) and other end secured under foot, curl arm up as far as possible. Repeat _10___ times per set. Do _1-2___ sessions per day, every other day.    Elbow Extension: Resisted   Sit in chair with resistive band secured at armrest (or hold with other hand) and _left______ elbow bent. Straighten elbow. Repeat _10___ times per set.  Do _1-2___ sessions per day, every other day.   Copyright  VHI. All rights reserved.

## 2018-03-07 NOTE — Patient Instructions (Signed)
Use your exercise chart to chart completion of talking exercises.

## 2018-03-07 NOTE — Therapy (Signed)
[facility] [address] [place], [place], [address] Phone: [phone]   Fax:  [phone]  Physical Therapy Treatment  Patient Details  Name: Leah Norton MRN: [id number] Date of Birth: 05/31/1955 Referring Provider (PT): Seward Carol, MD   Encounter Date: 03/07/2018  PT End of Session - 03/07/18 1314    Visit Number  11    Number of Visits  23    Date for PT Re-Evaluation  05/12/18    Authorization Type  State BCBS    PT Start Time  1103    PT Stop Time  1145    PT Time Calculation (min)  42 min    Equipment Utilized During Treatment  Gait belt    Activity Tolerance  Patient tolerated treatment well;Other (comment)   pt needed several seated rest breaks   Behavior During Therapy  WFL for tasks assessed/performed       Past Medical History:  Diagnosis Date  . Anginal pain ([place])   . Arthritis    "right anikle" (02/29/2016)  . CAD in native artery 02/08/2016   Three-vessel coronary artery calcification on CT scan  . Complication of anesthesia    "had reaction w/hives, rash, itching" (02/29/2016)  . Constipation   . Coronary artery disease    12/17 PCI with DESx1 to pLAD EF normal  . Enlarged heart   . GERD (gastroesophageal reflux disease)   . High cholesterol   . Hypertension   . Migraine    hx  . Stroke ([place])   . Symptomatic anemia 07/10/2016  . Type II diabetes mellitus ([place])     Past Surgical History:  Procedure Laterality Date  . ABDOMINAL HYSTERECTOMY    . CARDIAC CATHETERIZATION N/A 02/29/2016   Procedure: Left Heart Cath and Coronary Angiography;  Surgeon: Belva Crome, MD;  Location: [facility];  Service: Cardiovascular;  Laterality: N/A;  . CARDIAC CATHETERIZATION N/A 02/29/2016   Procedure: Coronary Stent Intervention;  Surgeon: Belva Crome, MD;  Location: [facility];  Service: Cardiovascular;  Laterality: N/A;  . CARDIAC CATHETERIZATION  2005   "no blockages"   . COLONOSCOPY WITH PROPOFOL N/A 03/15/2014   Procedure: COLONOSCOPY WITH PROPOFOL;  Surgeon: Garlan Fair, MD;  Location: [facility]OS[facility];  Service: Endoscopy;  Laterality: N/A;  . CORONARY ANGIOPLASTY WITH STENT PLACEMENT    . ESOPHAGOGASTRODUODENOSCOPY N/A 07/11/2016   Procedure: ESOPHAGOGASTRODUODENOSCOPY (EGD);  Surgeon: Laurence Spates, MD;  Location[facility] ENDOSCOPY;  Service: Endoscopy;  Laterality: N/A;  . FRACTURE SURGERY    . MULTIPLE TOOTH EXTRACTIONS     teeth removed  . ORIF ANKLE FRACTURE Right 11/12/2012   Procedure: OPEN REDUCTION INTERNAL FIXATION (ORIF) ANKLE FRACTURE;  Surgeon: Marianna Payment, MD;  Location: [facility];  Service: Orthopedics;  Laterality: Right;  Open reduction internal fixation ankle fracture, trimalleolar    There were no vitals filed for this visit.  Subjective Assessment - 03/07/18 1313    Subjective  Denies falls or changes.  Reports she is driving and that her MD told her she could.    Pertinent History  PMH includes essential HTN, hyperlipidemia, DM, DDD lumbo-sacral spine; Hx of CVA 01/15/18    Patient Stated Goals  Pt's goals for physical therapy are to do whatever I can to return to normal.    Currently in Pain?  No/denies       Seated on green therapy disc on mat with marching bil alternating LE's and LAQ's x 10 reps  x 2 sets.  Cues for upright posture. Standing in parallel bars on blue foam with head turns/nods, sidestepping on blue foam-with intermittent UE support.  Bosu with bil UE support-pt unable to do without UE assist. Standing on blue foam mat and stepping onto/off step with intermittent UE assist.  Performed same with lateral step onto/off step.    PT Short Term Goals - 02/14/18 2057      PT SHORT TERM GOAL #1   Title  Pt will be independent with HEP for improved strength, balance, gait.  TARGET 03/14/18    Time  5    Period  Weeks      PT SHORT TERM GOAL #2   Title  Pt will improve DGI score to at least 21/24 for decreased fall risk.     Time  5    Period  Weeks    Status  New      PT SHORT TERM GOAL #3   Title  Pt to improve 6 minute walk test goal by at least 150 ft for improved gait efficiency in community.    Baseline  02/13/18:  866 ft in 6MWT    Time  5    Period  Weeks    Status  Revised      PT SHORT TERM GOAL #4   Title  Pt will verbalize understanding of CVA warning signs and symptoms.    Time  5    Period  Weeks    Status  New        PT Long Term Goals - 02/11/18 1459      PT LONG TERM GOAL #1   Title  Pt will verbalize understanding of fall prevention in the home environment.  TARGET 04/11/18    Time  9    Period  Weeks    Status  New    Target Date  04/11/18      PT LONG TERM GOAL #2   Title  Pt will improve 5x sit<>stand score to less than or equal to 11.4 seconds for improved functional strength and transfer efficiency.    Time  9    Period  Weeks    Status  New    Target Date  04/11/18      PT LONG TERM GOAL #3   Title  Pt will improve gait velocity to at least 2.62 ft/sec for improved gait efficiency and safety.    Time  9    Period  Weeks    Status  New    Target Date  04/11/18      PT LONG TERM GOAL #4   Title  Pt will ambulate at least 1000 ft, indoor and outdoor surfaces, independently no LOB, for improved gait negotiation outdoor surfaces.    Time  9    Period  Weeks    Status  New    Target Date  04/11/18            Plan - 03/07/18 1318    Clinical Impression Statement  Skilled session focused on balance and core stability.  Pt reports that she is driving and MD told her she could yet according to OT this is not documented in MD note in Epic.  Pt continues with inattention to tasks at times.  Continue PT per POC.    Rehab Potential  Good    PT Frequency  Other (comment)   1/wk in addition to eval, then 3x/wk for 4 weeks, 2x/wk for 4 weeks  PT Treatment/Interventions  ADLs/Self Care Home Management;Gait training;Functional mobility training;Therapeutic  activities;Therapeutic exercise;Balance training;Patient/family education;Neuromuscular re-education    PT Next Visit Plan  Continue trunk control activities; SLS activities, L step length/dorsiflexion/heelstrike    Consulted and Agree with Plan of Care  Patient       Patient will benefit from skilled therapeutic intervention in order to improve the following deficits and impairments:  Abnormal gait, Decreased balance, Decreased mobility, Difficulty walking, Decreased strength  Visit Diagnosis: Unsteadiness on feet  Other abnormalities of gait and mobility  Other lack of coordination  Muscle weakness (generalized)     Problem List Patient Active Problem List   Diagnosis Date Noted  . Acute ischemic stroke ([place]) 01/16/2018  . Acute CVA (cerebrovascular accident) ([place]) 01/16/2018  . GI bleed 07/10/2016  . Morbid obesity due to excess calories ([place]) 05/14/2016  . Dyspnea on exertion 04/29/2016  . Abnormal nuclear stress test   . CAD S/P percutaneous coronary angioplasty 02/08/2016  . INSOMNIA, CHRONIC 02/28/2010  . HEADACHE 02/28/2010  . HEEL PAIN, RIGHT 11/29/2009  . COUGH DUE TO ACE INHIBITORS 11/29/2009  . ATRIAL ENLARGEMENT, LEFT 03/21/2009  . ANEMIA 12/28/2008  . CARDIAC MURMUR 12/28/2008  . OTHER NONSPECIFIC ABNORMAL SERUM ENZYME LEVELS 12/28/2008  . CERVICAL MUSCLE STRAIN 12/28/2008  . CONSTIPATION 06/01/2008  . POSTMENOPAUSAL STATUS 05/15/2008  . BRONCHITIS, CHRONIC 12/18/2007  . Cigarette smoker 08/19/2007  . DEGENERATIVE DISC DISEASE, LUMBOSACRAL SPINE 08/19/2007  . LOW BACK PAIN SYNDROME 08/19/2007  . UPPER RESPIRATORY INFECTION, VIRAL 03/05/2007  . NEOPLASM, SKIN, UNCERTAIN BEHAVIOR 43/32/9518  . GANGLION CYST, WRIST, LEFT 01/28/2007  . DENTAL PAIN 01/19/2007  . DM (diabetes mellitus), type 2, uncontrolled with complications ([place]) 84/16/6063  . Hyperlipidemia with target LDL less than 70 12/10/2006  . Essential hypertension 12/10/2006  . RHINITIS, ALLERGIC  NEC 12/10/2006  . HIP PAIN, RIGHT 12/10/2006    Narda Bonds, PTA [facility] 03/07/18 1:19 PM Phone: [phone] Fax: [facility] [address] [place], [place], [address] Phone: [phone]   Fax:  [phone]  Name: Leah Norton MRN: [id number] Date of Birth: Oct 05, 1955

## 2018-03-10 ENCOUNTER — Ambulatory Visit: Payer: BC Managed Care – PPO | Admitting: Speech Pathology

## 2018-03-10 ENCOUNTER — Encounter: Payer: Self-pay | Admitting: Physical Therapy

## 2018-03-10 ENCOUNTER — Ambulatory Visit: Payer: BC Managed Care – PPO | Admitting: Physical Therapy

## 2018-03-10 VITALS — BP 145/71 | HR 92

## 2018-03-10 DIAGNOSIS — R2681 Unsteadiness on feet: Secondary | ICD-10-CM

## 2018-03-10 DIAGNOSIS — M6281 Muscle weakness (generalized): Secondary | ICD-10-CM

## 2018-03-10 DIAGNOSIS — R278 Other lack of coordination: Secondary | ICD-10-CM | POA: Diagnosis not present

## 2018-03-10 DIAGNOSIS — R471 Dysarthria and anarthria: Secondary | ICD-10-CM

## 2018-03-10 DIAGNOSIS — R41841 Cognitive communication deficit: Secondary | ICD-10-CM

## 2018-03-10 DIAGNOSIS — R2689 Other abnormalities of gait and mobility: Secondary | ICD-10-CM

## 2018-03-10 NOTE — Therapy (Signed)
[facility] [address] [place], [place], [address] Phone: [phone]   Fax:  [phone]  Speech Language Pathology Treatment  Patient Details  Name: Leah Norton MRN: [id number] Date of Birth: 05-13-1955 Referring Provider (SLP): Seward Carol, MD   Encounter Date: 03/10/2018  End of Session - 03/10/18 1704    Visit Number  12    Number of Visits  21    Date for SLP Re-Evaluation  04/12/18    Authorization Type  BCBS    SLP Start Time  4536    SLP Stop Time   1529    SLP Time Calculation (min)  36 min    Activity Tolerance  Patient tolerated treatment well       Past Medical History:  Diagnosis Date  . Anginal pain ([place])   . Arthritis    "right anikle" (02/29/2016)  . CAD in native artery 02/08/2016   Three-vessel coronary artery calcification on CT scan  . Complication of anesthesia    "had reaction w/hives, rash, itching" (02/29/2016)  . Constipation   . Coronary artery disease    12/17 PCI with DESx1 to pLAD EF normal  . Enlarged heart   . GERD (gastroesophageal reflux disease)   . High cholesterol   . Hypertension   . Migraine    hx  . Stroke ([place])   . Symptomatic anemia 07/10/2016  . Type II diabetes mellitus ([place])     Past Surgical History:  Procedure Laterality Date  . ABDOMINAL HYSTERECTOMY    . CARDIAC CATHETERIZATION N/A 02/29/2016   Procedure: Left Heart Cath and Coronary Angiography;  Surgeon: Belva Crome, MD;  Location: [facility];  Service: Cardiovascular;  Laterality: N/A;  . CARDIAC CATHETERIZATION N/A 02/29/2016   Procedure: Coronary Stent Intervention;  Surgeon: Belva Crome, MD;  Location: [facility];  Service: Cardiovascular;  Laterality: N/A;  . CARDIAC CATHETERIZATION  2005   "no blockages"  . COLONOSCOPY WITH PROPOFOL N/A 03/15/2014   Procedure: COLONOSCOPY WITH PROPOFOL;  Surgeon: Garlan Fair, MD;  Location: [facility];  Service: Endoscopy;   Laterality: N/A;  . CORONARY ANGIOPLASTY WITH STENT PLACEMENT    . ESOPHAGOGASTRODUODENOSCOPY N/A 07/11/2016   Procedure: ESOPHAGOGASTRODUODENOSCOPY (EGD);  Surgeon: Laurence Spates, MD;  Location: [facility];  Service: Endoscopy;  Laterality: N/A;  . FRACTURE SURGERY    . MULTIPLE TOOTH EXTRACTIONS     teeth removed  . ORIF ANKLE FRACTURE Right 11/12/2012   Procedure: OPEN REDUCTION INTERNAL FIXATION (ORIF) ANKLE FRACTURE;  Surgeon: Marianna Payment, MD;  Location: [facility];  Service: Orthopedics;  Laterality: Right;  Open reduction internal fixation ankle fracture, trimalleolar    There were no vitals filed for this visit.  Subjective Assessment - 03/10/18 1456    Subjective  "The way y'all be talking you probably know I have a conflict with the schedule tomorrow."    Currently in Pain?  No/denies            ADULT SLP TREATMENT - 03/10/18 1453      General Information   Behavior/Cognition  Alert;Cooperative;Pleasant mood;Distractible      Treatment Provided   Treatment provided  Cognitive-Linquistic      Cognitive-Linquistic Treatment   Treatment focused on  Cognition    Skilled Treatment  Pt 8 min late for session today (continues to drive although SLP/OT have discouraged this due to cognitive deficits, specifically attention). She reports having family members ride with her in the  car over the holidays. Pt had not completed any of her HEP despite SLP having assisted pt with a schedule/checklist during previous session. She reports she was busy with the holidays and "didn't get a chance." SLP questioned pt about what she could do to help her remember or schedule time to complete therapy activities at home, and pt stated, "I don't know, what do you suggest?" SLP suggested written or digital reminders, or creating a routine/regular schedule. Mod question cues required to identify a time to complete her HEP regularly in the evenings after the news. When SLP asked pt how she would  remember to do so, pt stated, "I'll remember that." SLP educated re: deficits, and that SLP suspects pt's recall has hindered her from completing her HEP regularly thus far. Upon SLP instruction, pt recorded a reminder on her calendar to complete HEP this evening. Pt completed first two exercises with mod I, using tallies to track her repetitions. Extended time, question cues required to locate and check appropriate box on her schedule after completion. SLP targeted attention, awareness, and problem solving in functional task (bill-paying). Pt required mod question cues occasionally to locate typing errors when using internet browser to search for billing website and when locating correct payment details on mock utility bills.        Assessment / Recommendations / Plan   Plan  Continue with current plan of care      Progression Toward Goals   Progression toward goals  Not progressing toward goals (comment)   decreased awareness/attention        SLP Short Term Goals - 03/10/18 1705      SLP SHORT TERM GOAL #1   Title  Pt will respond with personally relevant sentences 18/20 with [facility] speech fluidity over three sessions with use of compensations (slow rate)    Status  Achieved      SLP SHORT TERM GOAL #2   Title  Pt will use a memory compensation system to record, reference and organize daily activities/appointments with occasional min A x 2 sessions     Status  Achieved      SLP SHORT TERM GOAL #3   Title  Pt will tell SLP 2 cognitive deficits that would impact her at work.    Status  Partially Met      SLP SHORT TERM GOAL #4   Title  Pt will demo attention to detail by recalling pertinent information from 3-5 sentence auditory messages 85% accuracy x 3 sessions (verbal/ written summary or responding to questions)    Status  Not Met       SLP Long Term Goals - 03/10/18 1705      SLP LONG TERM GOAL #1   Title  Using compensatory strategies for fluency/dysarthria, pt will functionally  ask investigative questions or give a verbal report of functional scenarios over 4 sessions    Time  3    Period  Weeks    Status  On-going      SLP LONG TERM GOAL #2   Title  Pt will demo WFL fluidity of speech in 10 minutes mod to complex/complex conversation with modified independence outside of ST room x 3 sessions    Time  3    Period  Weeks    Status  On-going      SLP LONG TERM GOAL #3   Title  pt will demo functional executive function (e.g., planning, organization) ability in personally relevant tasks (written and/or auditorily provided) 90%  of the time     Time  3    Period  Weeks    Status  On-going      SLP LONG TERM GOAL #4   Title  Pt will demo 15 minutes alternating attention between two mod complex cognitive tasks to achieve >85% success on each task over two sessions    Time  3    Period  Weeks    Status  On-going      SLP LONG TERM GOAL #5   Title  pt will demo WNL emergent awareness with mod complex tasks in order to achieve >95% success over three sessions     Time  3    Period  Weeks    Status  On-going       Plan - 03/10/18 1704    Clinical Impression Statement  Patient presents with moderate cognitive communication deficits, and mild dysarthria. No dysfluencies heard today in conversation. Impairments persist in selective attn, attn to detail, executive function, and awareness noted today, as well as impulsivity. Pt continues to drive unsupervised so SLP again reminded pt that SLPs have recommended pt only drive with supervision on low-travelled neighborhood roads for <10 minutes at a time. Current deficits in attention, recall, and organization would also hinder her ability to complete job duties at this time, including completing investigations, dictation, and writing reports. I recommend skilled ST to maximize cognitive and communicative function for possible return to work and improved independence and QOL.    Speech Therapy Frequency  3x / week   3x per  week for 4 weeks, then 2x per week for 4 weeks   Duration  --   8 weeks or 21 total visits   Treatment/Interventions  Cognitive reorganization;Multimodal communcation approach;Compensatory strategies;Compensatory techniques;Cueing hierarchy;Internal/external aids;Functional tasks;SLP instruction and feedback;Patient/family education    Potential to Achieve Goals  Good       Patient will benefit from skilled therapeutic intervention in order to improve the following deficits and impairments:   Cognitive communication deficit  Dysarthria and anarthria    Problem List Patient Active Problem List   Diagnosis Date Noted  . Acute ischemic stroke ([place]) 01/16/2018  . Acute CVA (cerebrovascular accident) ([place]) 01/16/2018  . GI bleed 07/10/2016  . Morbid obesity due to excess calories ([place]) 05/14/2016  . Dyspnea on exertion 04/29/2016  . Abnormal nuclear stress test   . CAD S/P percutaneous coronary angioplasty 02/08/2016  . INSOMNIA, CHRONIC 02/28/2010  . HEADACHE 02/28/2010  . HEEL PAIN, RIGHT 11/29/2009  . COUGH DUE TO ACE INHIBITORS 11/29/2009  . ATRIAL ENLARGEMENT, LEFT 03/21/2009  . ANEMIA 12/28/2008  . CARDIAC MURMUR 12/28/2008  . OTHER NONSPECIFIC ABNORMAL SERUM ENZYME LEVELS 12/28/2008  . CERVICAL MUSCLE STRAIN 12/28/2008  . CONSTIPATION 06/01/2008  . POSTMENOPAUSAL STATUS 05/15/2008  . BRONCHITIS, CHRONIC 12/18/2007  . Cigarette smoker 08/19/2007  . DEGENERATIVE DISC DISEASE, LUMBOSACRAL SPINE 08/19/2007  . LOW BACK PAIN SYNDROME 08/19/2007  . UPPER RESPIRATORY INFECTION, VIRAL 03/05/2007  . NEOPLASM, SKIN, UNCERTAIN BEHAVIOR 25/36/6440  . GANGLION CYST, WRIST, LEFT 01/28/2007  . DENTAL PAIN 01/19/2007  . DM (diabetes mellitus), type 2, uncontrolled with complications ([place]) 34/74/2595  . Hyperlipidemia with target LDL less than 70 12/10/2006  . Essential hypertension 12/10/2006  . RHINITIS, ALLERGIC NEC 12/10/2006  . HIP PAIN, RIGHT 12/10/2006   Deneise Lever, Jansen,  [place] 03/10/2018, 5:06 PM  [facility] [address] [place], [place], [address]  Phone: [phone]   Fax:  [phone]   Name: MARGUARITE MARKOV MRN: [id number] Date of Birth: Jul 11, 1955

## 2018-03-10 NOTE — Therapy (Signed)
[facility] [address] [place], [place], [address] Phone: [phone]   Fax:  [phone]  Physical Therapy Treatment  Patient Details  Name: Leah Norton MRN: [id number] Date of Birth: Sep 22, 1955 Referring Provider (PT): Seward Carol, MD   Encounter Date: 03/10/2018  PT End of Session - 03/10/18 1623    Visit Number  12    Number of Visits  23    Date for PT Re-Evaluation  05/12/18    Authorization Type  State BCBS    PT Start Time  3267    PT Stop Time  1245    PT Time Calculation (min)  43 min    Activity Tolerance  Patient tolerated treatment well;Other (comment)   pt needed several seated rest breaks   Behavior During Therapy  WFL for tasks assessed/performed       Past Medical History:  Diagnosis Date  . Anginal pain ([place])   . Arthritis    "right anikle" (02/29/2016)  . CAD in native artery 02/08/2016   Three-vessel coronary artery calcification on CT scan  . Complication of anesthesia    "had reaction w/hives, rash, itching" (02/29/2016)  . Constipation   . Coronary artery disease    12/17 PCI with DESx1 to pLAD EF normal  . Enlarged heart   . GERD (gastroesophageal reflux disease)   . High cholesterol   . Hypertension   . Migraine    hx  . Stroke ([place])   . Symptomatic anemia 07/10/2016  . Type II diabetes mellitus ([place])     Past Surgical History:  Procedure Laterality Date  . ABDOMINAL HYSTERECTOMY    . CARDIAC CATHETERIZATION N/A 02/29/2016   Procedure: Left Heart Cath and Coronary Angiography;  Surgeon: Belva Crome, MD;  Location: [facility];  Service: Cardiovascular;  Laterality: N/A;  . CARDIAC CATHETERIZATION N/A 02/29/2016   Procedure: Coronary Stent Intervention;  Surgeon: Belva Crome, MD;  Location: [facility];  Service: Cardiovascular;  Laterality: N/A;  . CARDIAC CATHETERIZATION  2005   "no blockages"  . COLONOSCOPY WITH PROPOFOL N/A 03/15/2014    Procedure: COLONOSCOPY WITH PROPOFOL;  Surgeon: Garlan Fair, MD;  Location: [facility];  Service: Endoscopy;  Laterality: N/A;  . CORONARY ANGIOPLASTY WITH STENT PLACEMENT    . ESOPHAGOGASTRODUODENOSCOPY N/A 07/11/2016   Procedure: ESOPHAGOGASTRODUODENOSCOPY (EGD);  Surgeon: Laurence Spates, MD;  Location: [facility];  Service: Endoscopy;  Laterality: N/A;  . FRACTURE SURGERY    . MULTIPLE TOOTH EXTRACTIONS     teeth removed  . ORIF ANKLE FRACTURE Right 11/12/2012   Procedure: OPEN REDUCTION INTERNAL FIXATION (ORIF) ANKLE FRACTURE;  Surgeon: Marianna Payment, MD;  Location: [facility];  Service: Orthopedics;  Laterality: Right;  Open reduction internal fixation ankle fracture, trimalleolar    Vitals:   03/10/18 1549  BP: (!) 145/71  Pulse: 92    Subjective Assessment - 03/10/18 1538    Subjective  Denies falls    Patient Stated Goals  Pt's goals for physical therapy are to do whatever I can to return to normal.    Currently in Pain?  No/denies         [facility] Adult PT Treatment/Exercise - 03/10/18 0001      Transfers   Transfers  Sit to Stand;Stand to Sit    Sit to Stand  5: Supervision    Stand to Sit  5: Supervision    Number of Reps  10 reps    Transfer  Cueing  needed cues to keep from pushing against mat and for forward lean      Ambulation/Gait   Ambulation/Gait  Yes    Ambulation/Gait Assistance  7: Independent    Ambulation/Gait Assistance Details  6 minute walk test wihout device x 1002 feet    Ambulation Distance (Feet)  1002 Feet    Assistive device  None    Gait Pattern  Step-through pattern;Decreased step length - left;Decreased trunk rotation;Decreased arm swing - left;Poor foot clearance - left;Decreased dorsiflexion - left    Ambulation Surface  Level;Indoor    Stairs  Yes    Stairs Assistance  6: Modified independent (Device/Increase time)    Stair Management Technique  One rail Right;Alternating pattern;Forwards    Number of Stairs  4    Height of Stairs   6      Dynamic Gait Index   Level Surface  Normal    Change in Gait Speed  Normal    Gait with Horizontal Head Turns  Mild Impairment    Gait with Vertical Head Turns  Normal    Gait and Pivot Turn  Normal    Step Over Obstacle  Normal    Step Around Obstacles  Normal    Steps  Mild Impairment    Total Score  22      Knee/Hip Exercises: Standing   Hip Flexion  AROM;Stengthening;Both;1 set;10 reps;Knee bent    Hip Abduction  AROM;Stengthening;Both;1 set;10 reps;Knee straight    Abduction Limitations  counter for UE support    Hip Extension  AROM;Stengthening;Both;1 set;10 reps;Knee straight    Extension Limitations  cues for extension (vs hip abduction with extension)             PT Education - 03/10/18 1620    Education Details  Progress toward goals, Fall Prevention, CVA warning signs/symptoms    Person(s) Educated  Patient    Methods  Explanation;Handout    Comprehension  Verbalized understanding       PT Short Term Goals - 03/10/18 1622      PT SHORT TERM GOAL #1   Title  Pt will be independent with HEP for improved strength, balance, gait.  TARGET 03/14/18    Time  5    Period  Weeks    Status  Achieved      PT SHORT TERM GOAL #2   Title  Pt will improve DGI score to at least 21/24 for decreased fall risk.    Baseline  22/24 on 03/10/18    Time  5    Period  Weeks    Status  Achieved      PT SHORT TERM GOAL #3   Title  Pt to improve 6 minute walk test goal by at least 150 ft for improved gait efficiency in community.    Baseline  02/13/18:  866 ft in 6MWT;1002 ft in 6MWT on 03/10/18    Time  5    Period  Weeks    Status  Not Met      PT SHORT TERM GOAL #4   Title  Pt will verbalize understanding of CVA warning signs and symptoms.    Time  5    Period  Weeks    Status  Achieved        PT Long Term Goals - 03/10/18 1623      PT LONG TERM GOAL #1   Title  Pt will verbalize understanding of fall prevention in the home environment.  TARGET  04/11/18     Baseline  achieved 03/10/18    Time  9    Period  Weeks    Status  Achieved      PT LONG TERM GOAL #2   Title  Pt will improve 5x sit<>stand score to less than or equal to 11.4 seconds for improved functional strength and transfer efficiency.    Time  9    Period  Weeks    Status  New      PT LONG TERM GOAL #3   Title  Pt will improve gait velocity to at least 2.62 ft/sec for improved gait efficiency and safety.    Time  9    Period  Weeks    Status  New      PT LONG TERM GOAL #4   Title  Pt will ambulate at least 1000 ft, indoor and outdoor surfaces, independently no LOB, for improved gait negotiation outdoor surfaces.    Time  9    Period  Weeks    Status  New            Plan - 03/10/18 1624    Clinical Impression Statement  Pt met STG's 1, 2 and 4.  STG 3 not met as pt highly distracted in gym setting and slows down speed to look around gym, etc...  Pt states she does not know if she will be able to continue therapies after 12/31 due to new year and new deductible.  Continue PT per POC.    Rehab Potential  Good    PT Frequency  Other (comment)   1/wk in addition to eval, then 3x/wk for 4 weeks, 2x/wk for 4 weeks   PT Treatment/Interventions  ADLs/Self Care Home Management;Gait training;Functional mobility training;Therapeutic activities;Therapeutic exercise;Balance training;Patient/family education;Neuromuscular re-education    PT Next Visit Plan  Continue trunk control activities; SLS activities, L step length/dorsiflexion/heelstrike    Consulted and Agree with Plan of Care  Patient       Patient will benefit from skilled therapeutic intervention in order to improve the following deficits and impairments:  Abnormal gait, Decreased balance, Decreased mobility, Difficulty walking, Decreased strength  Visit Diagnosis: Other abnormalities of gait and mobility  Unsteadiness on feet  Muscle weakness (generalized)  Other lack of coordination     Problem  List Patient Active Problem List   Diagnosis Date Noted  . Acute ischemic stroke ([place]) 01/16/2018  . Acute CVA (cerebrovascular accident) ([place]) 01/16/2018  . GI bleed 07/10/2016  . Morbid obesity due to excess calories ([place]) 05/14/2016  . Dyspnea on exertion 04/29/2016  . Abnormal nuclear stress test   . CAD S/P percutaneous coronary angioplasty 02/08/2016  . INSOMNIA, CHRONIC 02/28/2010  . HEADACHE 02/28/2010  . HEEL PAIN, RIGHT 11/29/2009  . COUGH DUE TO ACE INHIBITORS 11/29/2009  . ATRIAL ENLARGEMENT, LEFT 03/21/2009  . ANEMIA 12/28/2008  . CARDIAC MURMUR 12/28/2008  . OTHER NONSPECIFIC ABNORMAL SERUM ENZYME LEVELS 12/28/2008  . CERVICAL MUSCLE STRAIN 12/28/2008  . CONSTIPATION 06/01/2008  . POSTMENOPAUSAL STATUS 05/15/2008  . BRONCHITIS, CHRONIC 12/18/2007  . Cigarette smoker 08/19/2007  . DEGENERATIVE DISC DISEASE, LUMBOSACRAL SPINE 08/19/2007  . LOW BACK PAIN SYNDROME 08/19/2007  . UPPER RESPIRATORY INFECTION, VIRAL 03/05/2007  . NEOPLASM, SKIN, UNCERTAIN BEHAVIOR 93/23/5573  . GANGLION CYST, WRIST, LEFT 01/28/2007  . DENTAL PAIN 01/19/2007  . DM (diabetes mellitus), type 2, uncontrolled with complications ([place]) 22/04/5425  . Hyperlipidemia with target LDL less than 70 12/10/2006  . Essential hypertension 12/10/2006  .  RHINITIS, ALLERGIC NEC 12/10/2006  . HIP PAIN, RIGHT 12/10/2006   Leah Norton, PTA [facility] 03/10/18 4:27 PM Phone: [phone] Fax: [facility] [address] [place], [place], [address] Phone: [phone]   Fax:  [phone]  Name: Leah Norton MRN: [id number] Date of Birth: 21-Sep-1955

## 2018-03-10 NOTE — Patient Instructions (Signed)
Fall Prevention in the Home, Adult Falls can cause injuries and can affect people from all age groups. There are many simple things that you can do to make your home safe and to help prevent falls. Ask for help when making these changes, if needed. What actions can I take to prevent falls? General instructions  Use good lighting in all rooms. Replace any light bulbs that burn out.  Turn on lights if it is dark. Use night-lights.  Place frequently used items in easy-to-reach places. Lower the shelves around your home if necessary.  Set up furniture so that there are clear paths around it. Avoid moving your furniture around.  Remove throw rugs and other tripping hazards from the floor.  Avoid walking on wet floors.  Fix any uneven floor surfaces.  Add color or contrast paint or tape to grab bars and handrails in your home. Place contrasting color strips on the first and last steps of stairways.  When you use a stepladder, make sure that it is completely opened and that the sides are firmly locked. Have someone hold the ladder while you are using it. Do not climb a closed stepladder.  Be aware of any and all pets. What can I do in the bathroom?      Keep the floor dry. Immediately clean up any water that spills onto the floor.  Remove soap buildup in the tub or shower on a regular basis.  Use non-skid mats or decals on the floor of the tub or shower.  Attach bath mats securely with double-sided, non-slip rug tape.  If you need to sit down while you are in the shower, use a plastic, non-slip stool.  Install grab bars by the toilet and in the tub and shower. Do not use towel bars as grab bars. What can I do in the bedroom?  Make sure that a bedside light is easy to reach.  Do not use oversized bedding that drapes onto the floor.  Have a firm chair that has side arms to use for getting dressed. What can I do in the kitchen?  Clean up any spills right away.  If you need to  reach for something above you, use a sturdy step stool that has a grab bar.  Keep electrical cables out of the way.  Do not use floor polish or wax that makes floors slippery. If you must use wax, make sure that it is non-skid floor wax. What can I do in the stairways?  Do not leave any items on the stairs.  Make sure that you have a light switch at the top of the stairs and the bottom of the stairs. Have them installed if you do not have them.  Make sure that there are handrails on both sides of the stairs. Fix handrails that are broken or loose. Make sure that handrails are as long as the stairways.  Install non-slip stair treads on all stairs in your home.  Avoid having throw rugs at the top or bottom of stairways, or secure the rugs with carpet tape to prevent them from moving.  Choose a carpet design that does not hide the edge of steps on the stairway.  Check any carpeting to make sure that it is firmly attached to the stairs. Fix any carpet that is loose or worn. What can I do on the outside of my home?  Use bright outdoor lighting.  Regularly repair the edges of walkways and driveways and fix any cracks.    Remove high doorway thresholds.  Trim any shrubbery on the main path into your home.  Regularly check that handrails are securely fastened and in good repair. Both sides of any steps should have handrails.  Install guardrails along the edges of any raised decks or porches.  Clear walkways of debris and clutter, including tools and rocks.  Have leaves, snow, and ice cleared regularly.  Use sand or salt on walkways during winter months.  In the garage, clean up any spills right away, including grease or oil spills. What other actions can I take?  Wear closed-toe shoes that fit well and support your feet. Wear shoes that have rubber soles or low heels.  Use mobility aids as needed, such as canes, walkers, scooters, and crutches.  Review your medicines with your  health care provider. Some medicines can cause dizziness or changes in blood pressure, which increase your risk of falling. Talk with your health care provider about other ways that you can decrease your risk of falls. This may include working with a physical therapist or trainer to improve your strength, balance, and endurance. Where to find more information  Centers for Disease Control and Prevention, STEADI: WebmailGuide.co.za  [employer] on Aging: BrainJudge.co.uk Contact a health care provider if:  You are afraid of falling at home.  You feel weak, drowsy, or dizzy at home.  You fall at home. Summary  There are many simple things that you can do to make your home safe and to help prevent falls.  Ways to make your home safe include removing tripping hazards and installing grab bars in the bathroom.  Ask for help when making these changes in your home. This information is not intended to replace advice given to you by your health care provider. Make sure you discuss any questions you have with your health care provider. Document Released: 02/16/2002 Document Revised: 10/11/2016 Document Reviewed: 10/11/2016 Elsevier Interactive Patient Education  2019 [employer].   Ischemic Stroke  An ischemic stroke (cerebrovascular accident, or CVA) is the sudden death of brain tissue that occurs when an area of the brain does not get enough oxygen. It is a medical emergency that must be treated right away. An ischemic stroke can cause permanent loss of brain function. This can cause problems with how different parts of your body function. What are the causes? This condition is caused by a decrease of oxygen supply to an area of the brain, which may be the result of:  A small blood clot (embolus) or a buildup of plaque in the blood vessels (atherosclerosis) that blocks blood flow in the brain.  An abnormal heart rhythm (atrial fibrillation).  A blocked or damaged artery  in the head or neck. Sometimes the cause of stroke is not known (cryptogenic). What increases the risk? Certain factors may make you more likely to develop this condition. Some of these factors are things that you can change, such as:  Obesity.  Smoking cigarettes.  Taking oral birth control, especially if you also use tobacco.  Physical inactivity.  Excessive alcohol use.  Use of illegal drugs, especially cocaine and methamphetamine. Other risk factors include:  High blood pressure (hypertension).  High cholesterol.  Diabetes mellitus.  Heart disease.  Being [place] American, Native American, Hispanic, or [place] Native.  Being over age 53.  Family history of stroke.  Previous history of blood clots, stroke, or transient ischemic attack (TIA).  Sickle cell disease.  Being a woman with a history of preeclampsia.  Migraine  headache.  Sleep apnea.  Irregular heartbeats, such as atrial fibrillation.  Chronic inflammatory diseases, such as rheumatoid arthritis or lupus.  Blood clotting disorders (hypercoagulable state). What are the signs or symptoms? Symptoms of this condition usually develop suddenly, or you may notice them after waking up from sleep. Symptoms may include sudden:  Weakness or numbness in your face, arm, or leg, especially on one side of your body.  Trouble walking or difficulty moving your arms or legs.  Loss of balance or coordination.  Confusion.  Slurred speech (dysarthria).  Trouble speaking, understanding speech, or both (aphasia).  Vision changes-such as double vision, blurred vision, or loss of vision-in one or both eyes.  Dizziness.  Nausea and vomiting.  Severe headache with no known cause. The headache is often described as the worst headache ever experienced. If possible, make note of the exact time that you last felt like your normal self and what time your symptoms started. Tell your health care provider. If symptoms  come and go, this could be a sign of a warning stroke, or TIA. Get help right away, even if you feel better. How is this diagnosed? This condition may be diagnosed based on:  Your symptoms, your medical history, and a physical exam.  CT scan of the brain.  MRI.  CT angiogram. This test uses a computer to take X-rays of your arteries. A dye may be injected into your blood to show the inside of your blood vessels more clearly.  MRI angiogram. This is a type of MRI that is used to evaluate the blood vessels.  Cerebral angiogram. This test uses X-rays and a dye to show the blood vessels in the brain and neck. You may need to see a health care provider who specializes in stroke care. A stroke specialist can be seen in person or through communication using telephone or television technology (telemedicine). Other tests may also be done to find the cause of the stroke, such as:  Electrocardiogram (ECG).  Continuous heart monitoring.  Echocardiogram.  Transesophageal echocardiogram (TEE).  Carotid ultrasound.  A scan of the brain circulation.  Blood tests.  Sleep study to check for sleep apnea. How is this treated? Treatment for this condition will depend on the duration, severity, and cause of your symptoms and on the area of the brain affected. It is very important to get treatment at the first sign of stroke symptoms. Some treatments work better if they are done within 3-6 hours of the onset of stroke symptoms. These initial treatments may include:  Aspirin.  Medicines to control blood pressure.  Medicine given by injection to dissolve the blood clot (thrombolytic).  Treatments given directly to the affected artery to remove or dissolve the blood clot. Other treatment options may include:  Oxygen.  IV fluids.  Medicines to thin the blood (anticoagulants or antiplatelets).  Procedures to increase blood flow. Medicines and changes to your diet may be used to help treat and  manage risk factors for stroke, such as diabetes, high cholesterol, and high blood pressure. After a stroke, you may work with physical, speech, mental health, or occupational therapists to help you recover. Follow these instructions at home: Medicines  Take over-the-counter and prescription medicines only as told by your health care provider.  If you were told to take a medicine to thin your blood, such as aspirin or an anticoagulant, take it exactly as told by your health care provider. ? Taking too much blood-thinning medicine can cause bleeding. ? If  you do not take enough blood-thinning medicine, you will not have the protection that you need against another stroke and other problems.  Understand the side effects of taking anticoagulant medicine. When taking this type of medicine, make sure you: ? Hold pressure over any cuts for longer than usual. ? Tell your dentist and other health care providers that you are taking anticoagulants before you have any procedures that may cause bleeding. ? Avoid activities that may cause trauma or injury. Eating and drinking  Follow instructions from your health care provider about diet.  Eat healthy foods.  If your ability to swallow was affected by the stroke, you may need to take steps to avoid choking, such as: ? Taking small bites when eating. ? Eating foods that are soft or pureed. Safety  Follow instructions from your health care team about physical activity.  Use a walker or cane as told by your health care provider.  Take steps to create a safe home environment in order to reduce the risk of falls. This may include: ? Having your home looked at by specialists. ? Installing grab bars in the bedroom and bathroom. ? Using safety equipment, such as raised toilets and a seat in the shower. General instructions  Do not use any tobacco products, such as cigarettes, chewing tobacco, and e-cigarettes. If you need help quitting, ask your  health care provider.  Limit alcohol intake to no more than 1 drink a day for nonpregnant women and 2 drinks a day for men. One drink equals 12 oz of beer, 5 oz of wine, or 1 oz of hard liquor.  If you need help to stop using drugs or alcohol, ask your health care provider about a referral to a program or specialist.  Maintain an active and healthy lifestyle. Get regular exercise as told by your health care provider.  Keep all follow-up visits as told by your health care provider, including visits with all specialists on your health care team. This is important. How is this prevented? Your risk of another stroke can be decreased by managing high blood pressure, high cholesterol, diabetes, heart disease, sleep apnea, and obesity. It can also be decreased by quitting smoking, limiting alcohol, and staying physically active. Your health care provider will continue to work with you on measures to prevent short-term and long-term complications of stroke. Get help right away if:   You have any symptoms of a stroke. "BE FAST" is an easy way to remember the main warning signs of a stroke: ? B - Balance. Signs are dizziness, sudden trouble walking, or loss of balance. ? E - Eyes. Signs are trouble seeing or a sudden change in vision. ? F - Face. Signs are sudden weakness or numbness of the face, or the face or eyelid drooping on one side. ? A - Arms. Signs are weakness or numbness in an arm. This happens suddenly and usually on one side of the body. ? S - Speech. Signs are sudden trouble speaking, slurred speech, or trouble understanding what people say. ? T - Time. Time to call emergency services. Write down what time symptoms started.  You have other signs of a stroke, such as: ? A sudden, severe headache with no known cause. ? Nausea or vomiting. ? Seizure.  These symptoms may represent a serious problem that is an emergency. Do not wait to see if the symptoms will go away. Get medical help  right away. Call your local emergency services (911 in the [place]  Do not drive yourself to the hospital. Summary  An ischemic stroke (cerebrovascular accident, or CVA) is the sudden death of brain tissue that occurs when an area of the brain does not get enough oxygen.  Symptoms of this condition usually develop suddenly, or you may notice them after waking up from sleep.  It is very important to get treatment at the first sign of stroke symptoms. Stroke is a medical emergency that must be treated right away. This information is not intended to replace advice given to you by your health care provider. Make sure you discuss any questions you have with your health care provider. Document Released: 02/26/2005 Document Revised: 11/15/2017 Document Reviewed: 05/25/2015 Elsevier Interactive Patient Education  2019 [employer].

## 2018-03-11 ENCOUNTER — Ambulatory Visit: Payer: BC Managed Care – PPO | Admitting: Physical Therapy

## 2018-03-11 ENCOUNTER — Ambulatory Visit: Payer: BC Managed Care – PPO | Admitting: Occupational Therapy

## 2018-03-11 ENCOUNTER — Ambulatory Visit: Payer: BC Managed Care – PPO | Admitting: Speech Pathology

## 2018-03-11 DIAGNOSIS — R278 Other lack of coordination: Secondary | ICD-10-CM | POA: Diagnosis not present

## 2018-03-11 DIAGNOSIS — M6281 Muscle weakness (generalized): Secondary | ICD-10-CM

## 2018-03-11 DIAGNOSIS — R471 Dysarthria and anarthria: Secondary | ICD-10-CM

## 2018-03-11 DIAGNOSIS — R41841 Cognitive communication deficit: Secondary | ICD-10-CM

## 2018-03-11 DIAGNOSIS — R41844 Frontal lobe and executive function deficit: Secondary | ICD-10-CM

## 2018-03-11 DIAGNOSIS — R2681 Unsteadiness on feet: Secondary | ICD-10-CM

## 2018-03-11 DIAGNOSIS — R2689 Other abnormalities of gait and mobility: Secondary | ICD-10-CM

## 2018-03-11 NOTE — Therapy (Signed)
[facility] [address] [place], [place], [address] Phone: [phone]   Fax:  [phone]  Occupational Therapy Treatment  Patient Details  Name: Leah Norton MRN: [id number] Date of Birth: [birth year] Referring Provider (OT): Dr. Delfina Redwood   Encounter Date: 03/11/2018  OT End of Session - 03/11/18 0905    Visit Number  9    Number of Visits  25    Date for OT Re-Evaluation  04/12/18    Authorization Type  State BCBS    OT Start Time  0900    OT Stop Time  0930    OT Time Calculation (min)  30 min       Past Medical History:  Diagnosis Date  . Anginal pain ([place])   . Arthritis    "right anikle" (02/29/2016)  . CAD in native artery 02/08/2016   Three-vessel coronary artery calcification on CT scan  . Complication of anesthesia    "had reaction w/hives, rash, itching" (02/29/2016)  . Constipation   . Coronary artery disease    12/17 PCI with DESx1 to pLAD EF normal  . Enlarged heart   . GERD (gastroesophageal reflux disease)   . High cholesterol   . Hypertension   . Migraine    hx  . Stroke ([place])   . Symptomatic anemia 07/10/2016  . Type II diabetes mellitus ([place])     Past Surgical History:  Procedure Laterality Date  . ABDOMINAL HYSTERECTOMY    . CARDIAC CATHETERIZATION N/A 02/29/2016   Procedure: Left Heart Cath and Coronary Angiography;  Surgeon: Belva Crome, MD;  Location: [facility];  Service: Cardiovascular;  Laterality: N/A;  . CARDIAC CATHETERIZATION N/A 02/29/2016   Procedure: Coronary Stent Intervention;  Surgeon: Belva Crome, MD;  Location: [facility];  Service: Cardiovascular;  Laterality: N/A;  . CARDIAC CATHETERIZATION  2005   "no blockages"  . COLONOSCOPY WITH PROPOFOL N/A 03/15/2014   Procedure: COLONOSCOPY WITH PROPOFOL;  Surgeon: Garlan Fair, MD;  Location: [facility];  Service: Endoscopy;  Laterality: N/A;  . CORONARY ANGIOPLASTY WITH STENT PLACEMENT     . ESOPHAGOGASTRODUODENOSCOPY N/A 07/11/2016   Procedure: ESOPHAGOGASTRODUODENOSCOPY (EGD);  Surgeon: Laurence Spates, MD;  [facility];  Service: Endoscopy;  Laterality: N/A;  . FRACTURE SURGERY    . MULTIPLE TOOTH EXTRACTIONS     teeth removed  . ORIF ANKLE FRACTURE Right 11/12/2012   Procedure: OPEN REDUCTION INTERNAL FIXATION (ORIF) ANKLE FRACTURE;  Surgeon: Marianna Payment, MD;  Location: [facility];  Service: Orthopedics;  Laterality: Right;  Open reduction internal fixation ankle fracture, trimalleolar    There were no vitals filed for this visit.  Subjective Assessment - 03/11/18 0904    Subjective   right leg pain    Pertinent History  R CVA    Patient Stated Goals  return to work    Currently in Pain?  Yes    Pain Score  4     Pain Location  Ankle    Pain Orientation  Right    Pain Descriptors / Indicators  Aching    Pain Type  Chronic pain    Pain Onset  More than a month ago    Pain Frequency  Intermittent    Aggravating Factors   walking    Pain Relieving Factors  movement                  Treatment: Therapist checked progress towards goals as pt reports  she may discharge from therapy after 12/31 due to insurance concerns. Pt performed grasp/ retrieve a 3 lbs weight from overhead shelf x 5 reps without drops. Simulated work task to write down verbal instructions, report in a busy environment. Delayed processing noted and increased time required, pt demonstrates difficulty staying on the line for writing. Ambulating while passing a ball between hands, and performing cognitive task, min-mod v.c required.           OT Short Term Goals - 03/11/18 1191      OT SHORT TERM GOAL #1   Title  I with inital HEP.    Status  Achieved      OT SHORT TERM GOAL #2   Title  Pt will improve LUE grip strength by 10 lbs for increased LUE functional use during ADLS.    Status  Achieved   28 lbs and 32 lbs     OT SHORT TERM GOAL #3   Title  Pt will  demonstrate improved LUE functional use for ADLSas evidenced by decreasing 9 hole peg test score to 30 secs or less.    Status  Achieved   29.34 sec     OT SHORT TERM GOAL #4   Title  Pt will demonstrate ability to perform basic cooking at a modified independent level demonstrating good safety awareness.    Status  Achieved      OT SHORT TERM GOAL #5   Title  Pt will demonstrate ability to maintain selective attention to a functional task in a busy environment x 30 mins without redirects.    Status  Achieved        OT Long Term Goals - 03/11/18 0906      OT LONG TERM GOAL #1   Title  I with updated HEP.    Time  8    Period  Weeks    Status  New      OT LONG TERM GOAL #2   Title  Pt will demonstrate ability to retrieve a 3 lbs item from overhead shelf with LUE demonstrating good control.    Time  8    Period  Weeks    Status  Achieved      OT LONG TERM GOAL #3   Title  Pt will demonstrate ability to perform a physical and cognitive task simultaneously with 90% or better accuracy in prep for driving and work activities.    Time  8    Period  Weeks    Status  On-going   grossly 75-80%     OT LONG TERM GOAL #4   Title  Pt will perfrom simulated work activities at a modified independent level.    Time  8    Period  Weeks    Status  On-going      OT LONG TERM GOAL #5   Title  Pt will perform mod complex home management at a modified independent level.    Time  8    Period  Weeks    Status  Achieved   requires increased time but pt is performing     OT LONG TERM GOAL #6   Title  Pt will demonstrate ability to perfrom mod complex cooking (ie: 2 items at 1 time )modified independently demonstrating good safety awareness.    Time  8    Period  Weeks    Status  On-going            Plan - 03/11/18 1309  Clinical Impression Statement  Pt demonstrates good overall progress.     Occupational Profile and client history currently impacting functional performance   PMH: DM II, HTN, CAD s/p percutaneous angioplasty, hx of GI bleed, tobacco use, Pt was completely I and working full time as [employer], Pt is currently living with family and requires assistance for transportation.    Occupational performance deficits (Please refer to evaluation for details):  ADL's;IADL's;Work;Play;Leisure;Social Participation    Rehab Potential  Good    Current Impairments/barriers affecting progress:  cognitive deficits, impulsivity, decreased awareness, decreased coordination    OT Frequency  3x / week    OT Duration  8 weeks    OT Treatment/Interventions  Self-care/ADL training;Therapeutic exercise;Gait Training;Aquatic Therapy;Moist Heat;Paraffin;Neuromuscular education;Splinting;Patient/family education;Balance training;Therapeutic activities;Functional Mobility Training;Energy conservation;Fluidtherapy;Ultrasound;Cryotherapy;Contrast Bath;DME and/or AE instruction;Manual Therapy;Passive range of motion;Cognitive remediation/compensation    Plan  continue to work towards unmet goals    Consulted and Agree with Plan of Care  Patient       Patient will benefit from skilled therapeutic intervention in order to improve the following deficits and impairments:  Abnormal gait, Decreased mobility, Decreased coordination, Decreased range of motion, Decreased strength, Impaired UE functional use, Impaired perceived functional ability, Difficulty walking, Decreased safety awareness, Decreased knowledge of precautions, Decreased balance  Visit Diagnosis: Muscle weakness (generalized)  Other lack of coordination  Frontal lobe and executive function deficit    Problem List Patient Active Problem List   Diagnosis Date Noted  . Acute ischemic stroke ([place]) 01/16/2018  . Acute CVA (cerebrovascular accident) ([place]) 01/16/2018  . GI bleed 07/10/2016  . Morbid obesity due to excess calories ([place]) 05/14/2016  . Dyspnea on exertion 04/29/2016  . Abnormal nuclear  stress test   . CAD S/P percutaneous coronary angioplasty 02/08/2016  . INSOMNIA, CHRONIC 02/28/2010  . HEADACHE 02/28/2010  . HEEL PAIN, RIGHT 11/29/2009  . COUGH DUE TO ACE INHIBITORS 11/29/2009  . ATRIAL ENLARGEMENT, LEFT 03/21/2009  . ANEMIA 12/28/2008  . CARDIAC MURMUR 12/28/2008  . OTHER NONSPECIFIC ABNORMAL SERUM ENZYME LEVELS 12/28/2008  . CERVICAL MUSCLE STRAIN 12/28/2008  . CONSTIPATION 06/01/2008  . POSTMENOPAUSAL STATUS 05/15/2008  . BRONCHITIS, CHRONIC 12/18/2007  . Cigarette smoker 08/19/2007  . DEGENERATIVE DISC DISEASE, LUMBOSACRAL SPINE 08/19/2007  . LOW BACK PAIN SYNDROME 08/19/2007  . UPPER RESPIRATORY INFECTION, VIRAL 03/05/2007  . NEOPLASM, SKIN, UNCERTAIN BEHAVIOR 07/86/7544  . GANGLION CYST, WRIST, LEFT 01/28/2007  . DENTAL PAIN 01/19/2007  . DM (diabetes mellitus), type 2, uncontrolled with complications ([place]) 92/03/69  . Hyperlipidemia with target LDL less than 70 12/10/2006  . Essential hypertension 12/10/2006  . RHINITIS, ALLERGIC NEC 12/10/2006  . HIP PAIN, RIGHT 12/10/2006    Torry Adamczak 03/11/2018, 1:13 PM  [facility] [address] [place], [place], [address] Phone: [phone]   Fax:  [phone]  Name: Leah Norton MRN: [id number] Date of Birth: Apr 04, 1955

## 2018-03-11 NOTE — Therapy (Signed)
[facility] [address] [place], [place], [address] Phone: [phone]   Fax:  [phone]  Speech Language Pathology Treatment  Patient Details  Name: Leah Norton MRN: [id number] Date of Birth: 1955-03-30 Referring Provider (SLP): Seward Carol, MD   Encounter Date: 03/11/2018  End of Session - 03/11/18 1034    Visit Number  13    Number of Visits  21    Date for SLP Re-Evaluation  04/12/18    Authorization Type  BCBS    SLP Start Time  [phone]    SLP Stop Time   1015    SLP Time Calculation (min)  41 min    Activity Tolerance  Patient tolerated treatment well       Past Medical History:  Diagnosis Date  . Anginal pain ([place])   . Arthritis    "right anikle" (02/29/2016)  . CAD in native artery 02/08/2016   Three-vessel coronary artery calcification on CT scan  . Complication of anesthesia    "had reaction w/hives, rash, itching" (02/29/2016)  . Constipation   . Coronary artery disease    12/17 PCI with DESx1 to pLAD EF normal  . Enlarged heart   . GERD (gastroesophageal reflux disease)   . High cholesterol   . Hypertension   . Migraine    hx  . Stroke ([place])   . Symptomatic anemia 07/10/2016  . Type II diabetes mellitus ([place])     Past Surgical History:  Procedure Laterality Date  . ABDOMINAL HYSTERECTOMY    . CARDIAC CATHETERIZATION N/A 02/29/2016   Procedure: Left Heart Cath and Coronary Angiography;  Surgeon: Belva Crome, MD;  Location: [facility];  Service: Cardiovascular;  Laterality: N/A;  . CARDIAC CATHETERIZATION N/A 02/29/2016   Procedure: Coronary Stent Intervention;  Surgeon: Belva Crome, MD;  Location: [facility];  Service: Cardiovascular;  Laterality: N/A;  . CARDIAC CATHETERIZATION  2005   "no blockages"  . COLONOSCOPY WITH PROPOFOL N/A 03/15/2014   Procedure: COLONOSCOPY WITH PROPOFOL;  Surgeon: Garlan Fair, MD;  Location: [facility];  Service: Endoscopy;   Laterality: N/A;  . CORONARY ANGIOPLASTY WITH STENT PLACEMENT    . ESOPHAGOGASTRODUODENOSCOPY N/A 07/11/2016   Procedure: ESOPHAGOGASTRODUODENOSCOPY (EGD);  Surgeon: Laurence Spates, MD;  Location: [facility];  Service: Endoscopy;  Laterality: N/A;  . FRACTURE SURGERY    . MULTIPLE TOOTH EXTRACTIONS     teeth removed  . ORIF ANKLE FRACTURE Right 11/12/2012   Procedure: OPEN REDUCTION INTERNAL FIXATION (ORIF) ANKLE FRACTURE;  Surgeon: Marianna Payment, MD;  Location: [facility];  Service: Orthopedics;  Laterality: Right;  Open reduction internal fixation ankle fracture, trimalleolar    There were no vitals filed for this visit.  Subjective Assessment - 03/11/18 0942    Subjective  "Today might be my last day."            ADULT SLP TREATMENT - 03/11/18 0001      General Information   Behavior/Cognition  Alert;Cooperative;Pleasant mood;Distractible      Treatment Provided   Treatment provided  Cognitive-Linquistic      Cognitive-Linquistic Treatment   Treatment focused on  Cognition    Skilled Treatment  Pt told SLP today that she is unsure of whether she is able financially to return in the new year for therapy because she has not met her insurance deductible. She told SLP she plans to call her insurance company to determine cost. Pt showed SLP her checklist of  exercises completed yesterday afternoon; SLP pointed out errors in checkboxes due to attention. SLP highlighted remaining checkboxes and explained use of compensations necessary due to decreased attention. SLP continued working on pt's attention, awareness and problem solving in functional online bill-paying task. Pt required occasional verbal cues for attending to written information, and for problem solving (scrolling to locate information not visible on the screen). She did spontaneously double-check data entries for accuracy (account and credit card numbers). SLP highlighted pt's attention and planning/organization deficits,  demonstrating need to slow down and read/process all information to ensure accuracy, particularly in detailed or novel tasks. Targeted attention and reading comprehension; pt selected news articles ([employer]), reading aloud 5-6 sentence articles and answered comprehension questions 100% accuracy. Pt enjoys reading news articles; SLP encouraged her to take notes when she does so and bring summaries to next ST session.      Assessment / Recommendations / Plan   Plan  Continue with current plan of care      Progression Toward Goals   Progression toward goals  Not progressing toward goals (comment)   decreased awareness/attention hinder progress        SLP Short Term Goals - 03/11/18 1035      SLP SHORT TERM GOAL #1   Title  Pt will respond with personally relevant sentences 18/20 with [facility] speech fluidity over three sessions with use of compensations (slow rate)    Status  Achieved      SLP SHORT TERM GOAL #2   Title  Pt will use a memory compensation system to record, reference and organize daily activities/appointments with occasional min A x 2 sessions     Status  Achieved      SLP SHORT TERM GOAL #3   Title  Pt will tell SLP 2 cognitive deficits that would impact her at work.    Status  Partially Met      SLP SHORT TERM GOAL #4   Title  Pt will demo attention to detail by recalling pertinent information from 3-5 sentence auditory messages 85% accuracy x 3 sessions (verbal/ written summary or responding to questions)    Status  Not Met       SLP Long Term Goals - 03/11/18 1035      SLP LONG TERM GOAL #1   Title  Using compensatory strategies for fluency/dysarthria, pt will functionally ask investigative questions or give a verbal report of functional scenarios over 4 sessions    Time  3    Period  Weeks    Status  On-going      SLP LONG TERM GOAL #2   Title  Pt will demo WFL fluidity of speech in 10 minutes mod to complex/complex conversation with modified independence  outside of ST room x 3 sessions    Time  3    Period  Weeks    Status  On-going      SLP LONG TERM GOAL #3   Title  pt will demo functional executive function (e.g., planning, organization) ability in personally relevant tasks (written and/or auditorily provided) 90% of the time     Time  3    Period  Weeks    Status  On-going      SLP LONG TERM GOAL #4   Title  Pt will demo 15 minutes alternating attention between two mod complex cognitive tasks to achieve >85% success on each task over two sessions    Time  3    Period  Weeks  Status  On-going      SLP LONG TERM GOAL #5   Title  pt will demo WNL emergent awareness with mod complex tasks in order to achieve >95% success over three sessions     Time  3    Period  Weeks    Status  On-going       Plan - 03/11/18 1035    Clinical Impression Statement  Patient presents with moderate cognitive communication deficits, and mild dysarthria. No dysfluencies heard today in conversation. Impairments persist in selective attn, attn to detail, executive function, and awareness noted today, as well as impulsivity. Pt continues to drive unsupervised so SLP again reminded pt that SLPs have recommended pt only drive with supervision on low-travelled neighborhood roads for <10 minutes at a time. Current deficits in attention, recall, and organization would also hinder her ability to complete job duties at this time, including completing investigations, dictation, and writing reports. I recommend skilled ST to maximize cognitive and communicative function for possible return to work and improved independence and QOL.    Speech Therapy Frequency  3x / week   3x per week for 4 weeks, then 2x per week for 4 weeks   Duration  --   8 weeks or 21 total visits   Treatment/Interventions  Cognitive reorganization;Multimodal communcation approach;Compensatory strategies;Compensatory techniques;Cueing hierarchy;Internal/external aids;Functional tasks;SLP  instruction and feedback;Patient/family education    Potential to Achieve Goals  Good       Patient will benefit from skilled therapeutic intervention in order to improve the following deficits and impairments:   Cognitive communication deficit  Dysarthria and anarthria    Problem List Patient Active Problem List   Diagnosis Date Noted  . Acute ischemic stroke ([place]) 01/16/2018  . Acute CVA (cerebrovascular accident) ([place]) 01/16/2018  . GI bleed 07/10/2016  . Morbid obesity due to excess calories ([place]) 05/14/2016  . Dyspnea on exertion 04/29/2016  . Abnormal nuclear stress test   . CAD S/P percutaneous coronary angioplasty 02/08/2016  . INSOMNIA, CHRONIC 02/28/2010  . HEADACHE 02/28/2010  . HEEL PAIN, RIGHT 11/29/2009  . COUGH DUE TO ACE INHIBITORS 11/29/2009  . ATRIAL ENLARGEMENT, LEFT 03/21/2009  . ANEMIA 12/28/2008  . CARDIAC MURMUR 12/28/2008  . OTHER NONSPECIFIC ABNORMAL SERUM ENZYME LEVELS 12/28/2008  . CERVICAL MUSCLE STRAIN 12/28/2008  . CONSTIPATION 06/01/2008  . POSTMENOPAUSAL STATUS 05/15/2008  . BRONCHITIS, CHRONIC 12/18/2007  . Cigarette smoker 08/19/2007  . DEGENERATIVE DISC DISEASE, LUMBOSACRAL SPINE 08/19/2007  . LOW BACK PAIN SYNDROME 08/19/2007  . UPPER RESPIRATORY INFECTION, VIRAL 03/05/2007  . NEOPLASM, SKIN, UNCERTAIN BEHAVIOR 09/32/3557  . GANGLION CYST, WRIST, LEFT 01/28/2007  . DENTAL PAIN 01/19/2007  . DM (diabetes mellitus), type 2, uncontrolled with complications ([place]) 32/20/2542  . Hyperlipidemia with target LDL less than 70 12/10/2006  . Essential hypertension 12/10/2006  . RHINITIS, ALLERGIC NEC 12/10/2006  . HIP PAIN, RIGHT 12/10/2006   Deneise Lever, [place], Maury E Kimyah Frein 03/11/2018, 10:36 AM  [facility] [address] [place], [place], [address] Phone: [phone]   Fax:  [phone]   Name: KAMRI GOTSCH MRN: [id number] Date  of Birth: Feb 20, 1956

## 2018-03-11 NOTE — Therapy (Signed)
[facility] [address] [place], [place], [address] Phone: [phone]   Fax:  [phone]  Physical Therapy Treatment  Patient Details  Name: Leah Norton MRN: [id number] Date of Birth: 14-Mar-1955 Referring Provider (PT): Seward Carol, MD   Encounter Date: 03/11/2018  PT End of Session - 03/11/18 1319    Visit Number  13    Number of Visits  23    Date for PT Re-Evaluation  05/12/18    Authorization Type  State BCBS    PT Start Time  3154    PT Stop Time  0086   pt had to leave early for MD appointment   PT Time Calculation (min)  24 min    Activity Tolerance  Patient tolerated treatment well;Other (comment)   pt needed several seated rest breaks   Behavior During Therapy  WFL for tasks assessed/performed       Past Medical History:  Diagnosis Date  . Anginal pain ([place])   . Arthritis    "right anikle" (02/29/2016)  . CAD in native artery 02/08/2016   Three-vessel coronary artery calcification on CT scan  . Complication of anesthesia    "had reaction w/hives, rash, itching" (02/29/2016)  . Constipation   . Coronary artery disease    12/17 PCI with DESx1 to pLAD EF normal  . Enlarged heart   . GERD (gastroesophageal reflux disease)   . High cholesterol   . Hypertension   . Migraine    hx  . Stroke ([place])   . Symptomatic anemia 07/10/2016  . Type II diabetes mellitus ([place])     Past Surgical History:  Procedure Laterality Date  . ABDOMINAL HYSTERECTOMY    . CARDIAC CATHETERIZATION N/A 02/29/2016   Procedure: Left Heart Cath and Coronary Angiography;  Surgeon: Belva Crome, MD;  Location: [facility];  Service: Cardiovascular;  Laterality: N/A;  . CARDIAC CATHETERIZATION N/A 02/29/2016   Procedure: Coronary Stent Intervention;  Surgeon: Belva Crome, MD;  Location: [facility];  Service: Cardiovascular;  Laterality: N/A;  . CARDIAC CATHETERIZATION  2005   "no blockages"  .  COLONOSCOPY WITH PROPOFOL N/A 03/15/2014   Procedure: COLONOSCOPY WITH PROPOFOL;  Surgeon: Garlan Fair, MD;  Location: [facility];  Service: Endoscopy;  Laterality: N/A;  . CORONARY ANGIOPLASTY WITH STENT PLACEMENT    . ESOPHAGOGASTRODUODENOSCOPY N/A 07/11/2016   Procedure: ESOPHAGOGASTRODUODENOSCOPY (EGD);  Surgeon: Laurence Spates, MD;  Location: [facility];  Service: Endoscopy;  Laterality: N/A;  . FRACTURE SURGERY    . MULTIPLE TOOTH EXTRACTIONS     teeth removed  . ORIF ANKLE FRACTURE Right 11/12/2012   Procedure: OPEN REDUCTION INTERNAL FIXATION (ORIF) ANKLE FRACTURE;  Surgeon: Marianna Payment, MD;  Location: [facility];  Service: Orthopedics;  Laterality: Right;  Open reduction internal fixation ankle fracture, trimalleolar    There were no vitals filed for this visit.  Subjective Assessment - 03/11/18 1316    Subjective  Denies falls.  Still wants to call her insurance about her deductible/copay for next year.  May decide not to continue therapy but wants to keep her scheduled appointments in place for now.      Pertinent History  PMH includes essential HTN, hyperlipidemia, DM, DDD lumbo-sacral spine; Hx of CVA 01/15/18    Patient Stated Goals  Pt's goals for physical therapy are to do whatever I can to return to normal.    Currently in Pain?  No/denies  Balance activities consisting on SLS with intermittent UE support, tandem stance and tandem gait intermittent UE support, taps to 6" step then 6", 12",6"' step with intermittent UE support, Rockerboard while tossing/catching ball.      PT Education - 03/11/18 1317    Education Details  Need to call and cancel appointments if she decides to finish up with therapy    Person(s) Educated  Patient    Methods  Explanation    Comprehension  Verbalized understanding       PT Short Term Goals - 03/10/18 1622      PT SHORT TERM GOAL #1   Title  Pt will be independent with HEP for improved strength, balance, gait.  TARGET  03/14/18    Time  5    Period  Weeks    Status  Achieved      PT SHORT TERM GOAL #2   Title  Pt will improve DGI score to at least 21/24 for decreased fall risk.    Baseline  22/24 on 03/10/18    Time  5    Period  Weeks    Status  Achieved      PT SHORT TERM GOAL #3   Title  Pt to improve 6 minute walk test goal by at least 150 ft for improved gait efficiency in community.    Baseline  02/13/18:  866 ft in 6MWT;1002 ft in 6MWT on 03/10/18    Time  5    Period  Weeks    Status  Not Met      PT SHORT TERM GOAL #4   Title  Pt will verbalize understanding of CVA warning signs and symptoms.    Time  5    Period  Weeks    Status  Achieved        PT Long Term Goals - 03/10/18 1623      PT LONG TERM GOAL #1   Title  Pt will verbalize understanding of fall prevention in the home environment.  TARGET 04/11/18    Baseline  achieved 03/10/18    Time  9    Period  Weeks    Status  Achieved      PT LONG TERM GOAL #2   Title  Pt will improve 5x sit<>stand score to less than or equal to 11.4 seconds for improved functional strength and transfer efficiency.    Time  9    Period  Weeks    Status  New      PT LONG TERM GOAL #3   Title  Pt will improve gait velocity to at least 2.62 ft/sec for improved gait efficiency and safety.    Time  9    Period  Weeks    Status  New      PT LONG TERM GOAL #4   Title  Pt will ambulate at least 1000 ft, indoor and outdoor surfaces, independently no LOB, for improved gait negotiation outdoor surfaces.    Time  9    Period  Weeks    Status  New            Plan - 03/11/18 1320    Clinical Impression Statement  Session focused on balance activities.  Pt still not sure if she will be able to continue therapyies in new year due to new deductible.  Continues PT per POC if pt decides to continue.    Rehab Potential  Good    PT Frequency  Other (comment)  1/wk in addition to eval, then 3x/wk for 4 weeks, 2x/wk for 4 weeks   PT  Treatment/Interventions  ADLs/Self Care Home Management;Gait training;Functional mobility training;Therapeutic activities;Therapeutic exercise;Balance training;Patient/family education;Neuromuscular re-education    PT Next Visit Plan  Coordination and balance activities.  Gait with cognitive task.      Consulted and Agree with Plan of Care  Patient       Patient will benefit from skilled therapeutic intervention in order to improve the following deficits and impairments:  Abnormal gait, Decreased balance, Decreased mobility, Difficulty walking, Decreased strength  Visit Diagnosis: Other abnormalities of gait and mobility  Unsteadiness on feet  Muscle weakness (generalized)  Other lack of coordination     Problem List Patient Active Problem List   Diagnosis Date Noted  . Acute ischemic stroke ([place]) 01/16/2018  . Acute CVA (cerebrovascular accident) ([place]) 01/16/2018  . GI bleed 07/10/2016  . Morbid obesity due to excess calories ([place]) 05/14/2016  . Dyspnea on exertion 04/29/2016  . Abnormal nuclear stress test   . CAD S/P percutaneous coronary angioplasty 02/08/2016  . INSOMNIA, CHRONIC 02/28/2010  . HEADACHE 02/28/2010  . HEEL PAIN, RIGHT 11/29/2009  . COUGH DUE TO ACE INHIBITORS 11/29/2009  . ATRIAL ENLARGEMENT, LEFT 03/21/2009  . ANEMIA 12/28/2008  . CARDIAC MURMUR 12/28/2008  . OTHER NONSPECIFIC ABNORMAL SERUM ENZYME LEVELS 12/28/2008  . CERVICAL MUSCLE STRAIN 12/28/2008  . CONSTIPATION 06/01/2008  . POSTMENOPAUSAL STATUS 05/15/2008  . BRONCHITIS, CHRONIC 12/18/2007  . Cigarette smoker 08/19/2007  . DEGENERATIVE DISC DISEASE, LUMBOSACRAL SPINE 08/19/2007  . LOW BACK PAIN SYNDROME 08/19/2007  . UPPER RESPIRATORY INFECTION, VIRAL 03/05/2007  . NEOPLASM, SKIN, UNCERTAIN BEHAVIOR 13/68/5992  . GANGLION CYST, WRIST, LEFT 01/28/2007  . DENTAL PAIN 01/19/2007  . DM (diabetes mellitus), type 2, uncontrolled with complications ([place]) 34/14/4360  . Hyperlipidemia with target  LDL less than 70 12/10/2006  . Essential hypertension 12/10/2006  . RHINITIS, ALLERGIC NEC 12/10/2006  . HIP PAIN, RIGHT 12/10/2006    Narda Bonds, PTA [place] 03/11/18 1:25 PM Phone: [phone] Fax: [facility] [address] [place], [place], [address] Phone: [phone]   Fax:  [phone]  Name: Leah Norton MRN: [id number] Date of Birth: 05/07/1955

## 2018-03-11 NOTE — Patient Instructions (Signed)
Call your insurance company to find out about your therapy coverage for next year. Call us at the clinic to let us know.   Keep using your checklist to mark off when you do your exercises.  Try taking notes about some of the articles that you read to help you pay closer attention. Bring your summaries with you when you come back to see Glendell Docker next time.

## 2018-03-13 ENCOUNTER — Ambulatory Visit: Payer: BC Managed Care – PPO | Admitting: Physical Therapy

## 2018-03-14 ENCOUNTER — Ambulatory Visit: Payer: BC Managed Care – PPO

## 2018-03-14 ENCOUNTER — Ambulatory Visit: Payer: BC Managed Care – PPO | Admitting: Occupational Therapy

## 2018-03-16 NOTE — Progress Notes (Signed)
I agree with the above plan 

## 2018-03-20 ENCOUNTER — Encounter: Payer: BC Managed Care – PPO | Admitting: Speech Pathology

## 2018-03-20 ENCOUNTER — Encounter: Payer: BC Managed Care – PPO | Admitting: Occupational Therapy

## 2018-03-20 ENCOUNTER — Ambulatory Visit: Payer: BC Managed Care – PPO | Admitting: Physical Therapy

## 2018-03-21 ENCOUNTER — Encounter: Payer: BC Managed Care – PPO | Admitting: Occupational Therapy

## 2018-03-21 ENCOUNTER — Ambulatory Visit: Payer: BC Managed Care – PPO | Admitting: Physical Therapy

## 2018-03-25 ENCOUNTER — Telehealth: Payer: Self-pay | Admitting: *Deleted

## 2018-03-25 NOTE — Telephone Encounter (Signed)
Pt [employer] total form on [employer].

## 2018-03-26 ENCOUNTER — Encounter: Payer: BC Managed Care – PPO | Admitting: Occupational Therapy

## 2018-03-26 ENCOUNTER — Ambulatory Visit: Payer: BC Managed Care – PPO | Admitting: Physical Therapy

## 2018-03-26 DIAGNOSIS — Z0289 Encounter for other administrative examinations: Secondary | ICD-10-CM

## 2018-03-28 ENCOUNTER — Encounter: Payer: BC Managed Care – PPO | Admitting: Occupational Therapy

## 2018-03-28 ENCOUNTER — Ambulatory Visit: Payer: BC Managed Care – PPO

## 2018-03-28 NOTE — Telephone Encounter (Signed)
Disability form given to [facility] NP.for review.

## 2018-04-01 ENCOUNTER — Encounter: Payer: BC Managed Care – PPO | Admitting: Occupational Therapy

## 2018-04-01 ENCOUNTER — Telehealth: Payer: Self-pay | Admitting: Adult Health

## 2018-04-01 ENCOUNTER — Ambulatory Visit: Payer: BC Managed Care – PPO | Admitting: Physical Therapy

## 2018-04-01 NOTE — Telephone Encounter (Signed)
Patient's records faxed over to [facility] on 04/01/18.

## 2018-04-02 NOTE — Telephone Encounter (Signed)
Disability form given to Leah Norton in medical records.Pt paid fee of 50.00. Form done by Janett Billow NP.

## 2018-04-03 ENCOUNTER — Ambulatory Visit: Payer: BC Managed Care – PPO | Admitting: Physical Therapy

## 2018-04-03 ENCOUNTER — Encounter: Payer: BC Managed Care – PPO | Admitting: Occupational Therapy

## 2018-04-03 ENCOUNTER — Encounter: Payer: BC Managed Care – PPO | Admitting: Speech Pathology

## 2018-04-08 ENCOUNTER — Encounter: Payer: BC Managed Care – PPO | Admitting: Occupational Therapy

## 2018-04-08 ENCOUNTER — Ambulatory Visit: Payer: BC Managed Care – PPO | Admitting: Physical Therapy

## 2018-04-08 ENCOUNTER — Encounter: Payer: BC Managed Care – PPO | Admitting: Speech Pathology

## 2018-04-11 ENCOUNTER — Ambulatory Visit: Payer: BC Managed Care – PPO | Admitting: Physical Therapy

## 2018-04-11 ENCOUNTER — Encounter: Payer: BC Managed Care – PPO | Admitting: Occupational Therapy

## 2018-04-18 ENCOUNTER — Ambulatory Visit: Payer: BC Managed Care – PPO | Admitting: Interventional Cardiology

## 2018-04-21 NOTE — Progress Notes (Signed)
Cardiology Office Note:    Date:  04/22/2018   ID:  Leah Norton, DOB 1955/12/20, MRN [id number]  PCP:  Seward Carol, MD  Cardiologist:  Sinclair Grooms, MD   Referring MD: Seward Carol, MD   Chief Complaint  Patient presents with  . Coronary Artery Disease  . Hypertension    History of Present Illness:    Leah Norton is a 63 y.o. female with a hx of diabetes, hyper-lipidemia, hypertension, tobacco abuse, nonobstructive coronary disease by angiography in 2005. Recent nuclear study demonstrated evidence of ischemia. Subsequent catheterization demonstrated 75-85% proximal LAD which was stented with DES. Brilinta caused dyspnea and has been discontinued with substitution of Plavix   Overall, she is doing relatively well.  She has not had chest pain.  She discontinued smoking.  She is relatively inactive.  Does not get 150 minutes of moderate activity per week.  She denies orthopnea PND.  She has not had syncope or significant palpitations.  No neurological complaints.  Compliant with current medical regimen.  Difficulty sleeping.  Has had a sleep study and supposed to have a second study I presume for CPAP titration.  This has not yet occurred.   Past Medical History:  Diagnosis Date  . Anginal pain ([place])   . Arthritis    "right anikle" (02/29/2016)  . CAD in native artery 02/08/2016   Three-vessel coronary artery calcification on CT scan  . Complication of anesthesia    "had reaction w/hives, rash, itching" (02/29/2016)  . Constipation   . Coronary artery disease    12/17 PCI with DESx1 to pLAD EF normal  . Enlarged heart   . GERD (gastroesophageal reflux disease)   . High cholesterol   . Hypertension   . Migraine    hx  . Stroke ([place])   . Symptomatic anemia 07/10/2016  . Type II diabetes mellitus ([place])     Past Surgical History:  Procedure Laterality Date  . ABDOMINAL HYSTERECTOMY    . CARDIAC CATHETERIZATION N/A 02/29/2016   Procedure: Left  Heart Cath and Coronary Angiography;  Surgeon: Belva Crome, MD;  Location: [place] [facility];  Service: Cardiovascular;  Laterality: N/A;  . CARDIAC CATHETERIZATION N/A 02/29/2016   Procedure: Coronary Stent Intervention;  Surgeon: Belva Crome, MD;  Location: [facility];  Service: Cardiovascular;  Laterality: N/A;  . CARDIAC CATHETERIZATION  2005   "no blockages"  . COLONOSCOPY WITH PROPOFOL N/A 03/15/2014   Procedure: COLONOSCOPY WITH PROPOFOL;  Surgeon: Garlan Fair, MD;  Location: [facility];  Service: Endoscopy;  Laterality: N/A;  . CORONARY ANGIOPLASTY WITH STENT PLACEMENT    . ESOPHAGOGASTRODUODENOSCOPY N/A 07/11/2016   Procedure: ESOPHAGOGASTRODUODENOSCOPY (EGD);  Surgeon: Laurence Spates, MD;  [facility];  Service: Endoscopy;  Laterality: N/A;  . FRACTURE SURGERY    . MULTIPLE TOOTH EXTRACTIONS     teeth removed  . ORIF ANKLE FRACTURE Right 11/12/2012   Procedure: OPEN REDUCTION INTERNAL FIXATION (ORIF) ANKLE FRACTURE;  Surgeon: Marianna Payment, MD;  Location: [place];  Service: Orthopedics;  Laterality: Right;  Open reduction internal fixation ankle fracture, trimalleolar    Current Medications: Current Meds  Medication Sig  . atenolol (TENORMIN) 50 MG tablet Take 1 tablet (50 mg total) by mouth daily.  Marland Kitchen buPROPion (WELLBUTRIN SR) 150 MG 12 hr tablet Take 150 mg by mouth 2 (two) times daily.   . Cholecalciferol (VITAMIN D3) 1000 units CAPS Take 2,000 Units by mouth daily.  . clopidogrel (PLAVIX)  75 MG tablet Take 1 tablet (75 mg total) by mouth daily.  . furosemide (LASIX) 20 MG tablet Take 1 tablet (20 mg total) by mouth daily as needed.  Marland Kitchen losartan (COZAAR) 100 MG tablet Take 1 tablet (100 mg total) by mouth daily.  . nitroGLYCERIN (NITROSTAT) 0.4 MG SL tablet Place 0.4 mg under the tongue every 5 (five) minutes as needed for chest pain (Call 911 at 3rd dose within 15 minutes.).  Marland Kitchen pantoprazole (PROTONIX) 40 MG tablet Take 40 mg by mouth daily.  .  rosuvastatin (CRESTOR) 10 MG tablet Take 10 mg by mouth daily.  . sitaGLIPtin-metformin (JANUMET) 50-1000 MG per tablet Take 1 tablet by mouth 2 (two) times daily with a meal.     Allergies:   Codeine; Erythromycin; Penicillins; Sulfonamide derivatives; and Vicodin [hydrocodone-acetaminophen]   Social History   Socioeconomic History  . Marital status: Single    Spouse name: Not on file  . Number of children: Not on file  . Years of education: Not on file  . Highest education level: Not on file  Occupational History  . Not on file  Social Needs  . Financial resource strain: Not on file  . Food insecurity:    Worry: Not on file    Inability: Not on file  . Transportation needs:    Medical: Not on file    Non-medical: Not on file  Tobacco Use  . Smoking status: Former Smoker    Packs/day: 0.50    Years: 42.00    Pack years: 21.00    Types: Cigarettes    Last attempt to quit: 01/15/2018    Years since quitting: 0.2  . Smokeless tobacco: Never Used  Substance and Sexual Activity  . Alcohol use: Yes    Comment: occasionally  . Drug use: No  . Sexual activity: Not on file  Lifestyle  . Physical activity:    Days per week: Not on file    Minutes per session: Not on file  . Stress: Not on file  Relationships  . Social connections:    Talks on phone: Not on file    Gets together: Not on file    Attends religious service: Not on file    Active member of club or organization: Not on file    Attends meetings of clubs or organizations: Not on file    Relationship status: Not on file  Other Topics Concern  . Not on file  Social History Narrative  . Not on file     Family History: The patient's family history includes Hypertension in her mother.  ROS:   Please see the history of present illness.    Muscle pain all other systems reviewed and are negative.  EKGs/Labs/Other Studies Reviewed:    The following studies were reviewed today:  2D Doppler echocardiogram  November 2019:  Study Conclusions  - Left ventricle: The cavity size was mildly dilated. Systolic   function was mildly to moderately reduced. The estimated ejection   fraction was in the range of 40% to 45%. Diffuse hypokinesis.   Moderate hypokinesis of the inferior myocardium. Features are   consistent with a pseudonormal left ventricular filling pattern,   with concomitant abnormal relaxation and increased filling   pressure (grade 2 diastolic dysfunction). - Mitral valve: There was mild regurgitation. - Left atrium: The atrium was moderately dilated. - Atrial septum: No defect or patent foramen ovale was identified.  Impressions:  - Reduced EF with global mild hypokinesis and moderate hypokinesis  inferiorly. Grade 2 diastolic dysfunction.   EKG:  EKG repeated  Recent Labs: 01/16/2018: ALT 25; BUN 12; Creatinine, Ser 0.70; Hemoglobin 12.6; Platelets 402; Potassium 3.5; Sodium 141  Recent Lipid Panel    Component Value Date/Time   CHOL 153 01/16/2018 0318   TRIG 169 (H) 01/16/2018 0318   HDL 42 01/16/2018 0318   CHOLHDL 3.6 01/16/2018 0318   VLDL 34 01/16/2018 0318   LDLCALC 77 01/16/2018 0318    Physical Exam:    VS:  BP 128/64   Pulse 78   Ht 5\' 4"  (1.626 m)   Wt 191 lb 3.2 oz (86.7 kg)   SpO2 96%   BMI 32.82 kg/m     Wt Readings from Last 3 Encounters:  04/22/18 191 lb 3.2 oz (86.7 kg)  02/26/18 185 lb (83.9 kg)  02/18/18 184 lb 6.4 oz (83.6 kg)     GEN: Moderate obesity. No acute distress HEENT: Normal NECK: No JVD. LYMPHATICS: No lymphadenopathy CARDIAC: RRR.  No murmur, no gallop, no edema VASCULAR: 2+ bilateral radial and carotid pulses, no bruits RESPIRATORY:  Clear to auscultation without rales, wheezing or rhonchi  ABDOMEN: Soft, non-tender, non-distended, No pulsatile mass, MUSCULOSKELETAL: No deformity  SKIN: Warm and dry NEUROLOGIC:  Alert and oriented x 3 PSYCHIATRIC:  Normal affect   ASSESSMENT:    1. CAD S/P percutaneous  coronary angioplasty   2. Chronic combined systolic and diastolic heart failure ([place])   3. Essential hypertension   4. Hyperlipidemia with target LDL less than 70   5. Morbid obesity due to excess calories ([place])   6. DM (diabetes mellitus), type 2, uncontrolled with complications ([place])   7. Acute CVA (cerebrovascular accident) ([place])   8. TOBACCO ABUSE    PLAN:    In order of problems listed above:  1. Stable from coronary standpoint.  Continue aspirin and Plavix.  Secondary risk prevention discussed. 2. LVEF 40 to 45%.  Will have to evaluate on the next office visit and consider starting guideline directed therapy. 3. Blood pressure is in good control.  Systolic target 884/16 mmHg. 4. Target LDL less than 70.  Discussed this in detail with the patient. 5. Not physically active.  Falling short of 150 minutes/week of moderate activity.  Encouraged to do better. 6. We discussed hemoglobin A1c less than 7.  She is currently 7.7 and this would improve with activity. 7. Not discussed 8. She has stopped smoking  Overall education and awareness concerning primary/secondary risk prevention was discussed in detail: LDL less than 70, hemoglobin A1c less than 7, blood pressure target less than 130/80 mmHg, >150 minutes of moderate aerobic activity per week, avoidance of smoking, weight control (via diet and exercise), and continued surveillance/management of/for obstructive sleep apnea.  1 year   Medication Adjustments/Labs and Tests Ordered: Current medicines are reviewed at length with the patient today.  Concerns regarding medicines are outlined above.  No orders of the defined types were placed in this encounter.  No orders of the defined types were placed in this encounter.   Patient Instructions  Medication Instructions:  Your physician recommends that you continue on your current medications as directed. Please refer to the Current Medication list given to you today.  If you need a  refill on your cardiac medications before your next appointment, please call your pharmacy.   Lab work: None If you have labs (blood work) drawn today and your tests are completely normal, you will receive your results only by: Marland Kitchen MyChart  Message (if you have MyChart) OR . A paper copy in the mail If you have any lab test that is abnormal or we need to change your treatment, we will call you to review the results.  Testing/Procedures: None  Follow-Up: At [facility], you and your health needs are our priority.  As part of our continuing mission to provide you with exceptional heart care, we have created designated Provider Care Teams.  These Care Teams include your primary Cardiologist (physician) and Advanced Practice Providers (APPs -  Physician Assistants and Nurse Practitioners) who all work together to provide you with the care you need, when you need it. You will need a follow up appointment in 12 months.  Please call our office 2 months in advance to schedule this appointment.  You may see Sinclair Grooms, MD or one of the following Advanced Practice Providers on your designated Care Team:   Truitt Merle, NP Cecilie Kicks, NP . Kathyrn Drown, NP  Any Other Special Instructions Will Be Listed Below (If Applicable).       Signed, Sinclair Grooms, MD  04/22/2018 2:58 PM    [place]

## 2018-04-22 ENCOUNTER — Encounter: Payer: Self-pay | Admitting: Interventional Cardiology

## 2018-04-22 ENCOUNTER — Encounter (INDEPENDENT_AMBULATORY_CARE_PROVIDER_SITE_OTHER): Payer: Self-pay

## 2018-04-22 ENCOUNTER — Ambulatory Visit (INDEPENDENT_AMBULATORY_CARE_PROVIDER_SITE_OTHER): Payer: BC Managed Care – PPO | Admitting: Interventional Cardiology

## 2018-04-22 VITALS — BP 128/64 | HR 78 | Ht 64.0 in | Wt 191.2 lb

## 2018-04-22 DIAGNOSIS — F17200 Nicotine dependence, unspecified, uncomplicated: Secondary | ICD-10-CM

## 2018-04-22 DIAGNOSIS — E118 Type 2 diabetes mellitus with unspecified complications: Secondary | ICD-10-CM

## 2018-04-22 DIAGNOSIS — I5042 Chronic combined systolic (congestive) and diastolic (congestive) heart failure: Secondary | ICD-10-CM

## 2018-04-22 DIAGNOSIS — IMO0002 Reserved for concepts with insufficient information to code with codable children: Secondary | ICD-10-CM

## 2018-04-22 DIAGNOSIS — E1165 Type 2 diabetes mellitus with hyperglycemia: Secondary | ICD-10-CM

## 2018-04-22 DIAGNOSIS — I10 Essential (primary) hypertension: Secondary | ICD-10-CM

## 2018-04-22 DIAGNOSIS — I639 Cerebral infarction, unspecified: Secondary | ICD-10-CM

## 2018-04-22 DIAGNOSIS — Z9861 Coronary angioplasty status: Secondary | ICD-10-CM

## 2018-04-22 DIAGNOSIS — I2510 Atherosclerotic heart disease of native coronary artery without angina pectoris: Secondary | ICD-10-CM

## 2018-04-22 DIAGNOSIS — E785 Hyperlipidemia, unspecified: Secondary | ICD-10-CM

## 2018-04-22 NOTE — Patient Instructions (Signed)

## 2018-04-30 ENCOUNTER — Telehealth: Payer: Self-pay

## 2018-04-30 NOTE — Telephone Encounter (Signed)
I called  [place] [employer] [place] at [phone]. I spoke with Nira Conn about pts form was just completed in January 2020. Nira Conn stated the pt has form with the number 703 at the bottom. If any pt has a form with 703 they are due every month until pt returns to work. Pt is currently on short term disability. Pt just paid for form in January 2020. PT fee will be waive. Janett Billow NP made aware of this.

## 2018-04-30 NOTE — Telephone Encounter (Signed)
Disability form done till pts appt on march 18,2020. Fee will be waive. Form given to [facility] for faxing.

## 2018-05-01 ENCOUNTER — Telehealth: Payer: Self-pay | Admitting: *Deleted

## 2018-05-01 NOTE — Telephone Encounter (Signed)
I fax pt [place] short-term form to [employer] @ [phone] on 05/01/18

## 2018-05-05 ENCOUNTER — Encounter: Payer: Self-pay | Admitting: Physical Therapy

## 2018-05-05 NOTE — Therapy (Signed)
[facility] [address] [place], [place], [address] Phone: [phone]   Fax:  [phone]  Patient Details  Name: Leah Norton MRN: [id number] Date of Birth: 04-Oct-1955 Referring Provider:  No ref. provider found  Encounter Date: 05/05/2018   PHYSICAL THERAPY DISCHARGE SUMMARY  Visits from Start of Care: 13  Current functional level related to goals / functional outcomes: PT Short Term Goals - 03/10/18 1622      PT SHORT TERM GOAL #1   Title  Pt will be independent with HEP for improved strength, balance, gait.  TARGET 03/14/18    Time  5    Period  Weeks    Status  Achieved      PT SHORT TERM GOAL #2   Title  Pt will improve DGI score to at least 21/24 for decreased fall risk.    Baseline  22/24 on 03/10/18    Time  5    Period  Weeks    Status  Achieved      PT SHORT TERM GOAL #3   Title  Pt to improve 6 minute walk test goal by at least 150 ft for improved gait efficiency in community.    Baseline  02/13/18:  866 ft in 6MWT;1002 ft in 6MWT on 03/10/18    Time  5    Period  Weeks    Status  Not Met      PT SHORT TERM GOAL #4   Title  Pt will verbalize understanding of CVA warning signs and symptoms.    Time  5    Period  Weeks    Status  Achieved      Pt has met 3 of 4 STGs, assessed 03/10/18. PT Long Term Goals - 03/10/18 1623      PT LONG TERM GOAL #1   Title  Pt will verbalize understanding of fall prevention in the home environment.  TARGET 04/11/18    Baseline  achieved 03/10/18    Time  9    Period  Weeks    Status  Achieved      PT LONG TERM GOAL #2   Title  Pt will improve 5x sit<>stand score to less than or equal to 11.4 seconds for improved functional strength and transfer efficiency.    Time  9    Period  Weeks    Status  New      PT LONG TERM GOAL #3   Title  Pt will improve gait velocity to at least 2.62 ft/sec for improved gait efficiency and safety.    Time  9    Period   Weeks    Status  New      PT LONG TERM GOAL #4   Title  Pt will ambulate at least 1000 ft, indoor and outdoor surfaces, independently no LOB, for improved gait negotiation outdoor surfaces.    Time  9    Period  Weeks    Status  New      Pt has met 1 of 4 LTGs.  Remaining LTGs not able to be assessed, due to pt cancelling remaining visits.   Remaining deficits: Decreased gait endurance   Education / Equipment: Educated in HEP, fall prevention, CVA risk factors and warning signs  Plan: Patient agrees to discharge.  Patient goals were not met. Patient is being discharged due to financial reasons.  ?????       [name] W. 05/05/2018, 11:30 AM Mady Haagensen, PT 05/05/18 11:32  AM Phone: [phone] Fax: [name] [address] [place], [place], [address] Phone: [phone]   Fax:  [phone]

## 2018-05-15 ENCOUNTER — Telehealth: Payer: Self-pay | Admitting: Adult Health

## 2018-05-15 NOTE — Telephone Encounter (Signed)
Leah Norton. in med. records spoke with pt./fim

## 2018-05-15 NOTE — Telephone Encounter (Signed)
Patient in the lobby wanting to hand deliver paperwork and speak with the nurse.

## 2018-05-21 NOTE — Telephone Encounter (Signed)
Disability form done by [facility] NP for March 2020. Pt has appt on 05/28/2018 to be evaluated for work. NO fee on form. Given to medical records.

## 2018-05-28 ENCOUNTER — Ambulatory Visit: Payer: BC Managed Care – PPO | Admitting: Adult Health

## 2018-05-28 ENCOUNTER — Other Ambulatory Visit: Payer: Self-pay

## 2018-05-28 ENCOUNTER — Encounter: Payer: Self-pay | Admitting: Adult Health

## 2018-05-28 VITALS — BP 152/78 | HR 91 | Ht 64.0 in | Wt 192.6 lb

## 2018-05-28 DIAGNOSIS — Z8673 Personal history of transient ischemic attack (TIA), and cerebral infarction without residual deficits: Secondary | ICD-10-CM

## 2018-05-28 DIAGNOSIS — IMO0002 Reserved for concepts with insufficient information to code with codable children: Secondary | ICD-10-CM

## 2018-05-28 DIAGNOSIS — I10 Essential (primary) hypertension: Secondary | ICD-10-CM | POA: Diagnosis not present

## 2018-05-28 DIAGNOSIS — E118 Type 2 diabetes mellitus with unspecified complications: Secondary | ICD-10-CM | POA: Diagnosis not present

## 2018-05-28 DIAGNOSIS — E1165 Type 2 diabetes mellitus with hyperglycemia: Secondary | ICD-10-CM

## 2018-05-28 DIAGNOSIS — E785 Hyperlipidemia, unspecified: Secondary | ICD-10-CM

## 2018-05-28 MED ORDER — CYCLOBENZAPRINE HCL 5 MG PO TABS: 5.0000 mg | ORAL_TABLET | Freq: Every day | ORAL | 0 refills | Status: AC

## 2018-05-28 NOTE — Patient Instructions (Signed)
Continue clopidogrel 75 mg daily  and Crestor  for secondary stroke prevention  Continue to follow up with PCP regarding cholesterol, blood pressure and diabetes management   Restart monitoring blood pressure at home and if remains elevated, follow-up with PCP for further management  Recommended to use Tylenol as needed for pain and not to take any additional aspirin or use of ibuprofen for pain management as this can thin your blood.  You can also use moist heat such as heat pack with flaxseed along with neck exercises as described below.  We will also initiate short course of muscle relaxers but if you continue to experience neck pain, please follow with PCP for further evaluation  Please notify office once you are able to undergo sleep apnea study and new referral will be placed  You are released to return back to work at this time but it is recommended to work short hours for the first few weeks and highly recommend your switch to daytime shifts due to medical conditions.  Work note provided  Maintain strict control of hypertension with blood pressure goal below 130/90, diabetes with hemoglobin A1c goal below 6.5% and cholesterol with LDL cholesterol (bad cholesterol) goal below 70 mg/dL. I also advised the patient to eat a healthy diet with plenty of whole grains, cereals, fruits and vegetables, exercise regularly and maintain ideal body weight.  Followup in the future with me in 6 months or call earlier if needed       Thank you for coming to see [place] at [facility]. I hope we have been able to provide you high quality care today.  You may receive a patient satisfaction survey over the next few weeks. We would appreciate your feedback and comments so that we may continue to improve ourselves and the health of our patients.

## 2018-05-28 NOTE — Progress Notes (Signed)
I agree with the above plan 

## 2018-05-28 NOTE — Progress Notes (Signed)
[facility] [address] [place]. [place] [address] [phone]       OFFICE FOLLOW UP NOTE  [name] Date of Birth:  18-Jun-1955 Medical Record Number:  [id number]   Reason for Referral:  hospital stroke follow up  CHIEF COMPLAINT:  Chief Complaint  Patient presents with   Follow-up    Stroke follow up room in back hallway pt with sister Leah Norton pt had to start therapy because could not afforrd the co payement this year her deductible had not been met    HPI:   [name] is a 63 year old female who is being seen today for follow-up visit regarding right pontine infarct in 01/2018 and is accompanied by her sister.  She continues to have difficulties intermittently with expressive aphasia but has had improvement of gait and balance difficulties.  She unfortunately was unable to continue to participate in therapy sessions due to financial reasons.  She does endorse continuation of exercises at home.  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  She currently works 12-hour night shifts.  She continues on Plavix without side effects of bleeding or bruising.  She continues on rosuvastatin without side effects myalgias.  Blood pressure today 152/78.  Patient does endorse mild headache over the past couple days located left occipital region but denies any other neurological symptoms.  Denies new or worsening stroke/TIA symptoms.        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 History obtained from patient, sister and chart review. Reviewed all radiology images and labs personally.  [name] is a 63 y.o. female with history of CAD s/p DES to pLAD in 12/17, hypercholesterolemia, HTN, DM2 and smoking   who presented with slurred speech, balance problems, AMS, and left sided  weakness. She did not receive IV t-PA due to late presentation.   CT head reviewed and negative for acute infarct.  MRI brain reviewed and showed subcentimeter acute early subacute infarct within the right pons.  CTA head and neck showed a 50 to 75% stenosis proximal right M1 MCA which was noncontributory with respect to the acute ischemia.  2D echo showed an EF of 40 to 45%.  LDL 77 and A1c 6.6.  Patient was on aspirin 81 mg PTA and recommended DAPT for 3 weeks on Plavix alone.  Etiology of infarct likely due to small vessel disease source.  Recommended to continue to follow with PCP in regards to HTN, HLD and DM management along with smoking cessation counseling provided.  Patient discharged home in stable condition with recommendations of outpatient PT.  Patient is being seen today for hospital follow-up and is accompanied by her sister.  Patient continues to have left hemiparesis but has been improving along with intermittent balance difficulties and is currently participating in PT/OT.  She also continues to have dysarthria with occasional aphasia and cognitive slowing but continues to participate in speech therapy.  She has since returned home where she is able to complete all her ADLs and some assistance with IADLs from her sister.  She has continued on aspirin and Plavix without side effects of bleeding or bruising.  Continues on Crestor without side effects of myalgias.  Blood pressure today 159/88 and patient does monitor at home with typical SBP 120-130.  She has completely quit smoking since hospital discharge.  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  She is currently receiving FMLA which was provided by PCP.  She does endorse snoring with insomnia and daytime fatigue which has been present prior to her stroke but does endorse worsening fatigue since hospital discharge.  She denies prior sleep study.  She is also been having left side ear  pains which was also present prior to her stroke and has been seen by ENT and per patient, she was told everything was satisfactory.  She does endorse neck pain on left side with some radiating pain up to her ear and a discomfort surrounding her ear up into her head occasionally causing headaches.  No further concerns at this time.  Denies new or worsening stroke/TIA symptoms.   ROS:   14 system review of systems performed and negative with exception of unexpected weight change, ear pain, blurred vision, cough, leg swelling, palpitation, constipation, insomnia, frequency of urination, joint pain, joint swelling, ecchymosis, walking difficulty, neck stiffness, memory loss, headache and speech difficulty  PMH:  Past Medical History:  Diagnosis Date   Anginal pain ([place])    Arthritis    "right anikle" (02/29/2016)   CAD in native artery 02/08/2016   Three-vessel coronary artery calcification on CT scan   Complication of anesthesia    "had reaction w/hives, rash, itching" (02/29/2016)   Constipation    Coronary artery disease    12/17 PCI with DESx1 to pLAD EF normal   Enlarged heart    GERD (gastroesophageal reflux disease)    High cholesterol    Hypertension    Migraine    hx   Stroke ([place])    Symptomatic anemia 07/10/2016   Type II diabetes mellitus ([facility])     PSH:  Past Surgical History:  Procedure Laterality Date   ABDOMINAL HYSTERECTOMY     CARDIAC CATHETERIZATION N/A 02/29/2016   Procedure: Left Heart Cath and Coronary Angiography;  Surgeon: Belva Crome, MD;  Location: [place];  Service: Cardiovascular;  Laterality: N/A;   CARDIAC CATHETERIZATION N/A 02/29/2016   Procedure: Coronary Stent Intervention;  Surgeon: Belva Crome, MD;  Location: [place] [facility];  Service: Cardiovascular;  Laterality: N/A;   CARDIAC CATHETERIZATION  2005   "no blockages"   COLONOSCOPY WITH PROPOFOL N/A 03/15/2014   Procedure: COLONOSCOPY WITH PROPOFOL;  Surgeon:  Garlan Fair, MD;  Location: [facility];  Service: Endoscopy;  Laterality: N/A;   CORONARY ANGIOPLASTY WITH STENT PLACEMENT     ESOPHAGOGASTRODUODENOSCOPY N/A 07/11/2016   Procedure: ESOPHAGOGASTRODUODENOSCOPY (EGD);  Surgeon: Laurence Spates, MD;  Location: [facility];  Service: Endoscopy;  Laterality: N/A;   FRACTURE SURGERY     MULTIPLE TOOTH EXTRACTIONS     teeth removed   ORIF ANKLE FRACTURE Right 11/12/2012   Procedure: OPEN REDUCTION INTERNAL FIXATION (ORIF) ANKLE FRACTURE;  Surgeon: Marianna Payment, MD;  Location: [facility];  Service: Orthopedics;  Laterality: Right;  Open reduction internal fixation ankle fracture, trimalleolar    Social History:  Social History   Socioeconomic History   Marital status: Single    Spouse name: Not on file   Number of children: Not on file   Years of education: Not on file   Highest education level: Not on file  Occupational History   Not on file  Social Needs   Financial resource strain: Not on file  Food insecurity:    Worry: Not on file    Inability: Not on file   Transportation needs:    Medical: Not on file    Non-medical: Not on file  Tobacco Use   Smoking status: Former Smoker    Packs/day: 0.50    Years: 42.00    Pack years: 21.00    Types: Cigarettes    Last attempt to quit: 01/15/2018    Years since quitting: 0.3   Smokeless tobacco: Never Used  Substance and Sexual Activity   Alcohol use: Yes    Comment: occasionally   Drug use: No   Sexual activity: Not on file  Lifestyle   Physical activity:    Days per week: Not on file    Minutes per session: Not on file   Stress: Not on file  Relationships   Social connections:    Talks on phone: Not on file    Gets together: Not on file    Attends religious service: Not on file    Active member of club or organization: Not on file    Attends meetings of clubs or organizations: Not on file    Relationship status: Not on file   Intimate partner  violence:    Fear of current or ex partner: Not on file    Emotionally abused: Not on file    Physically abused: Not on file    Forced sexual activity: Not on file  Other Topics Concern   Not on file  Social History Narrative   Not on file    Family History:  Family History  Problem Relation Age of Onset   Hypertension Mother     Medications:   Current Outpatient Medications on File Prior to Visit  Medication Sig Dispense Refill   buPROPion (WELLBUTRIN SR) 150 MG 12 hr tablet Take 150 mg by mouth 2 (two) times daily.      Cholecalciferol (VITAMIN D3) 1000 units CAPS Take 2,000 Units by mouth daily.     clopidogrel (PLAVIX) 75 MG tablet Take 1 tablet (75 mg total) by mouth daily. 90 tablet 3   furosemide (LASIX) 20 MG tablet Take 1 tablet (20 mg total) by mouth daily as needed. 30 tablet 3   losartan (COZAAR) 100 MG tablet Take 1 tablet (100 mg total) by mouth daily. 90 tablet 3   nitroGLYCERIN (NITROSTAT) 0.4 MG SL tablet Place 0.4 mg under the tongue every 5 (five) minutes as needed for chest pain (Call 911 at 3rd dose within 15 minutes.).     pantoprazole (PROTONIX) 40 MG tablet Take 40 mg by mouth daily.     rosuvastatin (CRESTOR) 10 MG tablet Take 10 mg by mouth daily.     sitaGLIPtin-metformin (JANUMET) 50-1000 MG per tablet Take 1 tablet by mouth 2 (two) times daily with a meal.     atenolol (TENORMIN) 50 MG tablet Take 1 tablet (50 mg total) by mouth daily. 180 tablet 3   No current facility-administered medications on file prior to visit.     Allergies:   Allergies  Allergen Reactions   Codeine Rash   Erythromycin Rash   Penicillins Rash    Has patient had a PCN reaction causing immediate rash, facial/tongue/throat swelling, SOB or lightheadedness with hypotension: Yes Has patient had a PCN reaction causing severe rash involving mucus membranes or skin necrosis: No Has patient had a PCN reaction that required hospitalization: No Has patient had a PCN  reaction occurring within the last 10 years: Yes  If all of the above answers are "NO", then may proceed with Cephalosporin use.    Sulfonamide Derivatives Rash   Vicodin [Hydrocodone-Acetaminophen] Rash     Physical Exam  Vitals:   05/28/18 1334  BP: (!) 152/78  Pulse: 91  Weight: 192 lb 9.6 oz (87.4 kg)  Height: _0  (1.626 m)   Body mass index is 33.06 kg/m. No exam data present  General: well developed, well nourished, pleasant middle-aged African-American female, seated, in no evident distress Head: head normocephalic and atraumatic.   Neck: supple with no carotid or supraclavicular bruits Cardiovascular: regular rate and rhythm, no murmurs Musculoskeletal: no deformity Skin:  no rash/petichiae Vascular:  Normal pulses all extremities  Neurologic Exam Mental Status: Awake and fully alert.  Mild dysarthria.  Oriented to place and time. Recent and remote memory intact. Attention span, concentration and fund of knowledge appropriate. Mood and affect appropriate.  Cranial Nerves: Fundoscopic exam reveals sharp disc margins. Pupils equal, briskly reactive to light. Extraocular movements full without nystagmus. Visual fields full to confrontation. Hearing intact. Facial sensation intact. Face, tongue, palate moves normally and symmetrically.  Motor: Normal bulk and tone.  Full strength throughout all tested extremities Sensory.: intact to touch , pinprick , position and vibratory sensation.  Coordination: Rapid alternating movements normal in all extremities. Finger-to-nose and heel-to-shin performed accurately bilaterally. Gait and Station: Arises from chair without difficulty. Stance is normal. Gait demonstrates normal stride length and balance. Able to heel, toe and tandem walk with difficulty.  Romberg negative. Reflexes: 1+ and symmetric. Toes downgoing.       Diagnostic Data (Labs, Imaging, Testing)  Ct Angio Head W Or Wo Contrast Ct Angio Neck W Or Wo  Contrast 01/16/2018 IMPRESSION:  RIGHT paramedian brainstem infarct is not visible on pre or postcontrast CT imaging. No visible hemorrhagic transformation. No posterior circulation/basilar artery stenosis or dissection is observed. Minor calcific atheromatous change of the BILATERAL cavernous carotid arteries. 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   Mr Brain Wo Contrast 01/16/2018 IMPRESSION:  1. Subcentimeter acute or early subacute infarct within the right pons, in keeping with reported left-sided weakness.  2. No acute hemorrhage, mass effect or hydrocephalus.   2D echocardiogram 01/16/2018 Study Conclusions - Left ventricle: The cavity size was mildly dilated. Systolic   function was mildly to moderately reduced. The estimated ejection   fraction was in the range of 40% to 45%. Diffuse hypokinesis.   Moderate hypokinesis of the inferior myocardium. Features are   consistent with a pseudonormal left ventricular filling pattern,   with concomitant abnormal relaxation and increased filling   pressure (grade 2 diastolic dysfunction). - Mitral valve: There was mild regurgitation. - Left atrium: The atrium was moderately dilated. - Atrial septum: No defect or patent foramen ovale was identified. Impressions: - Reduced EF with global mild hypokinesis and moderate hypokinesis   inferiorly. Grade 2 diastolic dysfunction.    ASSESSMENT: Leah Norton is a 63 y.o. year old female here with right pontine infarct on 01/16/2018 secondary to small vessel disease. Vascular risk factors include HTN, HLD, DM and tobacco use.  She is being seen today for follow-up visit and has made significant improvement regarding stroke deficits as she no longer has residual weakness or balance difficulties and only intermittent mild dysarthria.    PLAN:  1. Right pons  infarct: Continue clopidogrel 75 mg daily  and Lipitor  for secondary stroke prevention.  Maintain strict control of hypertension with blood pressure goal below 130/90, diabetes with hemoglobin A1c goal below 6.5% and cholesterol with LDL cholesterol (bad cholesterol) goal below 70 mg/dL.  I also advised the patient to eat a healthy diet with plenty of whole grains, cereals, fruits and vegetables, exercise regularly with at least 30 minutes of continuous activity daily and maintain ideal body weight.  2. HTN: Advised to continue current treatment regimen.  Today's BP 152/78.  She has not been recently monitoring BP at home and was advised to do so and to follow-up with PCP for ongoing management 3. HLD: Advised to continue current treatment regimen along with continued follow-up with PCP for future prescribing and monitoring of lipid panel 4. DMII: Advised to continue to monitor glucose levels at home along with continued follow-up with PCP for management and monitoring 5. Possible sleep apnea: Unfortunately she was unable to undergo OSA testing due to financial difficulties.  It was highly encouraged for her to undergo sleep study once able and to notify office for potential need of new referral 6. Headache: Likely multifactorial due to stress, muscle tension and seasonal allergies.  Likely not related to occipital neuralgia with report of present symptoms.  Was recommended at this time to use Tylenol as needed for pain along with use of moist heat and neck exercises which were provided in AVS.  Short course of Flexeril 5 mg nightly was prescribed for muscle tension/spasm.  Reviewed potential side effects including increased fatigue and dizziness therefore she will avoid daytime dose and will only use prior to bed.  She was advised to follow-up with PCP if she continues to experience headache and/or neck pain for further evaluation 7. Due to prior improvement of symptoms, she was released to return back to  work at this time with restrictions of hours and gradually increasing over 4-week span.  It was also highly recommended for her to refrain from overnight hours due to history of stroke and other chronic conditions and to be placed on dayshift schedule    Follow up in 6 months or call earlier if needed   Greater than 50% of time during this 45 minute visit was spent on counseling, explanation of diagnosis of right pons infarct, reviewing risk factor management of HLD, HTN, DM and tobacco use, answered numerous questions regarding etiology of infarct and risk factor management with preventing future stroke, discussion regarding ongoing headaches, return to work, planning of further management along with potential future management, and discussion with patient and family answering all questions.    Venancio Poisson, AGNP-BC  [facility] [address] [place],  [address]  Phone [phone] Fax [phone] Note: This document was prepared with digital dictation and possible smart phrase technology. Any transcriptional errors that result from this process are unintentional.

## 2018-06-15 ENCOUNTER — Other Ambulatory Visit: Payer: Self-pay | Admitting: Interventional Cardiology

## 2018-06-17 ENCOUNTER — Telehealth: Payer: Self-pay | Admitting: *Deleted

## 2018-06-17 NOTE — Telephone Encounter (Signed)
I called pt not able to LVM.

## 2018-06-20 ENCOUNTER — Telehealth: Payer: Self-pay | Admitting: Adult Health

## 2018-06-20 NOTE — Telephone Encounter (Signed)
Pt called stating she received a form that she had left on Monday to be filled out for her work place and when she opened the letter she said it was blank and no paperwork with it. Please advise.

## 2018-06-20 NOTE — Telephone Encounter (Signed)
error 

## 2018-06-23 ENCOUNTER — Telehealth: Payer: Self-pay | Admitting: *Deleted

## 2018-06-23 NOTE — Telephone Encounter (Signed)
I called pt, pt mailbox is full. We are unable to fill out the form. Pt release to return to work.

## 2018-06-24 ENCOUNTER — Telehealth: Payer: Self-pay | Admitting: *Deleted

## 2018-06-24 NOTE — Telephone Encounter (Signed)
error 

## 2018-06-26 NOTE — Telephone Encounter (Signed)
Janett Billow NP completed pts STD on 06/25/2018 for the month of April to be out of work. The pts job cannot accommodate her based on [place] NP recommendations letter. Fee waive. Form given to Debra on 06/25/2018. Pt will be evaluated again in May 2020.

## 2018-07-02 DIAGNOSIS — Z0289 Encounter for other administrative examinations: Secondary | ICD-10-CM

## 2018-07-24 ENCOUNTER — Telehealth: Payer: Self-pay

## 2018-07-24 NOTE — Telephone Encounter (Signed)
Form for disability completed for May, 2020 given to Hilda Blades in medical records to fax.

## 2018-07-24 NOTE — Telephone Encounter (Signed)
No fee for form. Form has to be completed monthly until she is evaluated again.

## 2018-08-27 ENCOUNTER — Telehealth: Payer: Self-pay

## 2018-08-27 NOTE — Telephone Encounter (Signed)
Short term disability form done till 11/27/2017. NO fee charge for form. Given to medical records.

## 2018-09-10 ENCOUNTER — Telehealth: Payer: Self-pay

## 2018-09-10 NOTE — Telephone Encounter (Signed)
Disability form done for patient. NO charge for form. Form has to be done monthly if pt does not return to work. Form given to Hilda Blades in medical records to fax.

## 2018-10-01 ENCOUNTER — Other Ambulatory Visit: Payer: Self-pay | Admitting: Internal Medicine

## 2018-10-01 DIAGNOSIS — R911 Solitary pulmonary nodule: Secondary | ICD-10-CM

## 2018-10-10 ENCOUNTER — Other Ambulatory Visit: Payer: BC Managed Care – PPO

## 2018-10-30 ENCOUNTER — Telehealth: Payer: Self-pay | Admitting: Adult Health

## 2018-10-30 NOTE — Telephone Encounter (Signed)
Pt has been r/s and added to wait list

## 2018-10-30 NOTE — Telephone Encounter (Signed)
I called patient today and LVM regarding rescheduling 9/18 appointment due to our office being closed on this day. I requested patient call back to reschedule.

## 2018-11-12 ENCOUNTER — Telehealth: Payer: Self-pay

## 2018-11-12 NOTE — Telephone Encounter (Signed)
If pt calls back per Janett Billow NP please schedule her in a hospital slot and change to office visit. Please only do this if an office visit is not available. Pt has appt on 11/30 and we need to see her early for an appt. We have disability forms and need to evaluate her and to discuss the forms.Plesae cancel the 11/30 appt and r/s for SEptember 2020. Thanks  LEft detail message on pts vm discussing this. Vm was left form will be done once she has an appt with JEssica NP in September 2019.

## 2018-11-12 NOTE — Telephone Encounter (Signed)
I discuss with JEssica NP pt has an appt in 01/2019 and we have been filling out her disability monthly since 01/2018. PEr Janett Billow NP pt has been on disability since 01/2018, pt will need a sooner appt to be evaluate.

## 2018-11-13 NOTE — Telephone Encounter (Addendum)
I called pt about needing a sooner appt with Janett Billow NP to discuss her disability forms and stroke follow up. PT stated she has another form for September that needs to be done. I stated her appt in November was cancel PT was given check in time and COVID 19 requirements such as mask, one visitor and no kids.PT schedule for 11/18/2018 at 1045am.

## 2018-11-13 NOTE — Telephone Encounter (Signed)
Disability form done and signed by Janett Billow NP. Form done till 12/10/2018.

## 2018-11-18 ENCOUNTER — Ambulatory Visit: Payer: Self-pay | Admitting: Adult Health

## 2018-11-19 ENCOUNTER — Encounter: Payer: Self-pay | Admitting: Adult Health

## 2018-11-19 ENCOUNTER — Encounter: Payer: Self-pay | Admitting: *Deleted

## 2018-11-19 ENCOUNTER — Other Ambulatory Visit: Payer: Self-pay

## 2018-11-19 ENCOUNTER — Ambulatory Visit: Payer: BC Managed Care – PPO | Admitting: Adult Health

## 2018-11-19 VITALS — BP 136/74 | HR 81 | Temp 96.2°F | Ht 66.0 in | Wt 183.6 lb

## 2018-11-19 DIAGNOSIS — Z8673 Personal history of transient ischemic attack (TIA), and cerebral infarction without residual deficits: Secondary | ICD-10-CM

## 2018-11-19 DIAGNOSIS — E785 Hyperlipidemia, unspecified: Secondary | ICD-10-CM | POA: Diagnosis not present

## 2018-11-19 DIAGNOSIS — E1165 Type 2 diabetes mellitus with hyperglycemia: Secondary | ICD-10-CM

## 2018-11-19 DIAGNOSIS — E118 Type 2 diabetes mellitus with unspecified complications: Secondary | ICD-10-CM

## 2018-11-19 DIAGNOSIS — IMO0002 Reserved for concepts with insufficient information to code with codable children: Secondary | ICD-10-CM

## 2018-11-19 DIAGNOSIS — I10 Essential (primary) hypertension: Secondary | ICD-10-CM | POA: Diagnosis not present

## 2018-11-19 NOTE — Patient Instructions (Signed)
Continue clopidogrel 75 mg daily  and crestor  for secondary stroke prevention  Continue to follow up with PCP regarding cholesterol, blood pressure and diabetes management   Follow up with Dr. Delfina Redwood regarding ankle pain  You can be released back to work on a restricted schedule - if they are not able to accept restrictions, it would be recommend for you to pursue a different job or look into social security disability  Continue to monitor blood pressure at home  Recommend undergoing sleep apnea testing as previously recommended - call office when you are able to do so  Maintain strict control of hypertension with blood pressure goal below 130/90, diabetes with hemoglobin A1c goal below 6.5% and cholesterol with LDL cholesterol (bad cholesterol) goal below 70 mg/dL. I also advised the patient to eat a healthy diet with plenty of whole grains, cereals, fruits and vegetables, exercise regularly and maintain ideal body weight.         Thank you for coming to see [place] at [facility]. I hope we have been able to provide you high quality care today.  You may receive a patient satisfaction survey over the next few weeks. We would appreciate your feedback and comments so that we may continue to improve ourselves and the health of our patients.

## 2018-11-19 NOTE — Progress Notes (Signed)
[facility] [address] [place]. [place] [address] [phone]       OFFICE FOLLOW UP NOTE  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 Leonie Man  CHIEF COMPLAINT:  Chief Complaint  Patient presents with   Follow-up    Treatment room, hx of stroke/OSA- Discuss disability. "conserned about ankle swelling"    HPI: Update 11/19/2018: [name] is being seen today for stroke follow-up accompanied by her sister.  Residual deficits mild dysarthria, short term memory deficits, and occasional balance difficulties.  She continues to live independently maintaining ADLs and IADLs without difficulty.  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  It was recommended to return to work with no more than 8-hour shifts during the day but unfortunately her current employer was unable to satisfy these recommendations.  Our office has been assisting with disability.  She is asking for another work letter with my recommendations as they were unable to satisfy before due to beginning of pandemic.  Continues on Plavix and Crestor for secondary stroke prevention without side effects.  Blood pressure stable at 136/74.  She does have concerns regarding right ankle swelling which is chronic and currently being followed by PCP regarding this concern.  Denies new or worsening stroke/TIA symptoms.   Update 05/28/2018: [name] is a 63 year old female who is being seen today for follow-up visit regarding right pontine infarct in 01/2018 and is accompanied by her sister.  She continues to have difficulties intermittently with dysarthria but has had improvement of gait and balance difficulties.  She unfortunately was unable to continue to participate in  therapy sessions due to financial reasons.  She does endorse continuation of exercises at home.  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  She currently works 12-hour night shifts.  She continues on Plavix without side effects of bleeding or bruising.  She continues on rosuvastatin without side effects myalgias.  Blood pressure today 152/78.  Patient does endorse mild headache over the past couple days located left occipital region but denies any other neurological symptoms.  Denies new or worsening stroke/TIA symptoms.  Initial visit 02/26/18: Patient is being seen today for hospital follow-up and is accompanied by her sister.  Patient continues to have left hemiparesis but has been improving along with intermittent balance difficulties and is currently participating in PT/OT.  She also continues to have dysarthria with occasional aphasia and cognitive slowing but continues to participate in speech therapy.  She has since returned home where she is able to complete all her ADLs and some assistance with IADLs from her sister.  She has continued on aspirin and Plavix without side effects of bleeding or bruising.  Continues on Crestor without side effects of myalgias.  Blood pressure today 159/88 and patient does monitor at home with typical SBP 120-130.  She has completely quit smoking since hospital discharge.  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  She is currently receiving FMLA which was provided by PCP.  She does endorse snoring with insomnia and daytime fatigue which has been present prior to her  stroke but does endorse worsening fatigue since hospital discharge.  She denies prior sleep study.  She is also been having left side ear pains which was also present prior to her stroke and has been seen by ENT and per patient, she was told everything was satisfactory.  She does  endorse neck pain on left side with some radiating pain up to her ear and a discomfort surrounding her ear up into her head occasionally causing headaches.  No further concerns at this time.  Denies new or worsening stroke/TIA symptoms.  Stroke admission 01/15/2018: [name] is a 63 y.o. female with history of CAD s/p DES to pLAD in 12/17, hypercholesterolemia, HTN, DM2 and smoking   who presented with slurred speech, balance problems, AMS, and left sided weakness. She did not receive IV t-PA due to late presentation.   CT head reviewed and negative for acute infarct.  MRI brain reviewed and showed subcentimeter acute early subacute infarct within the right pons.  CTA head and neck showed a 50 to 75% stenosis proximal right M1 MCA which was noncontributory with respect to the acute ischemia.  2D echo showed an EF of 40 to 45%.  LDL 77 and A1c 6.6.  Patient was on aspirin 81 mg PTA and recommended DAPT for 3 weeks on Plavix alone.  Etiology of infarct likely due to small vessel disease source.  Recommended to continue to follow with PCP in regards to HTN, HLD and DM management along with smoking cessation counseling provided.  Patient discharged home in stable condition with recommendations of outpatient PT.     ROS:   14 system review of systems performed and negative with exception of memory loss, dizziness, headache and speech difficulty   PMH:  Past Medical History:  Diagnosis Date   Anginal pain ([place])    Arthritis    "right anikle" (02/29/2016)   CAD in native artery 02/08/2016   Three-vessel coronary artery calcification on CT scan   Complication of anesthesia    "had reaction w/hives, rash, itching" (02/29/2016)   Constipation    Coronary artery disease    12/17 PCI with DESx1 to pLAD EF normal   Enlarged heart    GERD (gastroesophageal reflux disease)    High cholesterol    Hypertension    Migraine    hx   Stroke ([place])    Symptomatic anemia 07/10/2016     Type II diabetes mellitus (H[facility])     PSH:  Past Surgical History:  Procedure Laterality Date   ABDOMINAL HYSTERECTOMY     CARDIAC CATHETERIZATION N/A 02/29/2016   Procedure: Left Heart Cath and Coronary Angiography;  Surgeon: Belva Crome, MD;  Location: [facility];  Service: Cardiovascular;  Laterality: N/A;   CARDIAC CATHETERIZATION N/A 02/29/2016   Procedure: Coronary Stent Intervention;  Surgeon: Belva Crome, MD;  Location: [facility];  Service: Cardiovascular;  Laterality: N/A;   CARDIAC CATHETERIZATION  2005   "no blockages"   COLONOSCOPY WITH PROPOFOL N/A 03/15/2014   Procedure: COLONOSCOPY WITH PROPOFOL;  Surgeon: Garlan Fair, MD;  Location: [facility];  Service: Endoscopy;  Laterality: N/A;   CORONARY ANGIOPLASTY WITH STENT PLACEMENT     ESOPHAGOGASTRODUODENOSCOPY N/A 07/11/2016   Procedure: ESOPHAGOGASTRODUODENOSCOPY (EGD);  Surgeon: Laurence Spates, MD;  Location: [facility];  Service: Endoscopy;  Laterality: N/A;   FRACTURE SURGERY     MULTIPLE TOOTH EXTRACTIONS     teeth removed   ORIF ANKLE FRACTURE Right 11/12/2012  Procedure: OPEN REDUCTION INTERNAL FIXATION (ORIF) ANKLE FRACTURE;  Surgeon: Marianna Payment, MD;  Location: [facility];  Service: Orthopedics;  Laterality: Right;  Open reduction internal fixation ankle fracture, trimalleolar    Social History:  Social History   Socioeconomic History   Marital status: Single    Spouse name: Not on file   Number of children: Not on file   Years of education: Not on file   Highest education level: Not on file  Occupational History   Not on file  Social Needs   Financial resource strain: Not on file   Food insecurity    Worry: Not on file    Inability: Not on file   Transportation needs    Medical: Not on file    Non-medical: Not on file  Tobacco Use   Smoking status: Former Smoker    Packs/day: 0.50    Years: 42.00    Pack years: 21.00    Types: Cigarettes    Quit  date: 01/15/2018    Years since quitting: 0.8   Smokeless tobacco: Never Used  Substance and Sexual Activity   Alcohol use: Yes    Comment: occasionally   Drug use: No   Sexual activity: Not on file  Lifestyle   Physical activity    Days per week: Not on file    Minutes per session: Not on file   Stress: Not on file  Relationships   Social connections    Talks on phone: Not on file    Gets together: Not on file    Attends religious service: Not on file    Active member of club or organization: Not on file    Attends meetings of clubs or organizations: Not on file    Relationship status: Not on file   Intimate partner violence    Fear of current or ex partner: Not on file    Emotionally abused: Not on file    Physically abused: Not on file    Forced sexual activity: Not on file  Other Topics Concern   Not on file  Social History Narrative   Not on file    Family History:  Family History  Problem Relation Age of Onset   Hypertension Mother     Medications:   Current Outpatient Medications on File Prior to Visit  Medication Sig Dispense Refill   buPROPion (WELLBUTRIN SR) 150 MG 12 hr tablet Take 150 mg by mouth 2 (two) times daily.      Cholecalciferol (VITAMIN D3) 1000 units CAPS Take 2,000 Units by mouth daily.     clopidogrel (PLAVIX) 75 MG tablet Take 1 tablet (75 mg total) by mouth daily. 90 tablet 3   cyclobenzaprine (FLEXERIL) 5 MG tablet Take 1 tablet (5 mg total) by mouth at bedtime. 14 tablet 0   pantoprazole (PROTONIX) 40 MG tablet TAKE 1 TABLET BY MOUTH EVERY DAY 90 tablet 3   rosuvastatin (CRESTOR) 10 MG tablet Take 10 mg by mouth daily.     sitaGLIPtin-metformin (JANUMET) 50-1000 MG per tablet Take 1 tablet by mouth 2 (two) times daily with a meal.     atenolol (TENORMIN) 50 MG tablet Take 1 tablet (50 mg total) by mouth daily. 180 tablet 3   furosemide (LASIX) 20 MG tablet Take 1 tablet (20 mg total) by mouth daily as needed. 30 tablet 3    losartan (COZAAR) 100 MG tablet Take 1 tablet (100 mg total) by mouth daily. 90 tablet 3   No current  facility-administered medications on file prior to visit.     Allergies:   Allergies  Allergen Reactions   Codeine Rash   Erythromycin Rash   Penicillins Rash    Has patient had a PCN reaction causing immediate rash, facial/tongue/throat swelling, SOB or lightheadedness with hypotension: Yes Has patient had a PCN reaction causing severe rash involving mucus membranes or skin necrosis: No Has patient had a PCN reaction that required hospitalization: No Has patient had a PCN reaction occurring within the last 10 years: Yes If all of the above answers are "NO", then may proceed with Cephalosporin use.    Sulfonamide Derivatives Rash   Vicodin [Hydrocodone-Acetaminophen] Rash     Physical Exam  Vitals:   11/19/18 0756  BP: 136/74  Pulse: 81  Temp: (!) 96.2 F (35.7 C)  Weight: 183 lb 9.6 oz (83.3 kg)  Height: 5\' 6"  (1.676 m)   Body mass index is 29.63 kg/m. No exam data present  General: well developed, well nourished, pleasant middle-aged African-American female, seated, in no evident distress Head: head normocephalic and atraumatic.   Neck: supple with no carotid or supraclavicular bruits Cardiovascular: regular rate and rhythm, no murmurs Musculoskeletal: no deformity Skin:  no rash/petichiae Vascular:  Normal pulses all extremities  Neurologic Exam Mental Status: Awake and fully alert.  Mild dysarthria.  Oriented to place and time. Recent memory subjectively diminished and remote memory intact. Attention span, concentration and fund of knowledge appropriate. Mood and affect appropriate.  Cranial Nerves: Pupils equal, briskly reactive to light. Extraocular movements full without nystagmus. Visual fields full to confrontation. Hearing intact. Facial sensation intact. Face, tongue, palate moves normally and symmetrically.  Motor: Normal bulk and tone.  Full  strength throughout all tested extremities Sensory.: intact to touch , pinprick , position and vibratory sensation.  Coordination: Rapid alternating movements normal in all extremities. Finger-to-nose and heel-to-shin performed accurately bilaterally. Gait and Station: Arises from chair without difficulty. Stance is normal. Gait demonstrates normal stride length and balance. Able to heel, toe and tandem walk with difficulty.   Reflexes: 1+ and symmetric. Toes downgoing.       Diagnostic Data (Labs, Imaging, Testing)  Ct Angio Head W Or Wo Contrast Ct Angio Neck W Or Wo Contrast 01/16/2018 IMPRESSION:  RIGHT paramedian brainstem infarct is not visible on pre or postcontrast CT imaging. No visible hemorrhagic transformation. No posterior circulation/basilar artery stenosis or dissection is observed. Minor calcific atheromatous change of the BILATERAL cavernous carotid arteries. 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   Mr Brain Wo Contrast 01/16/2018 IMPRESSION:  1. Subcentimeter acute or early subacute infarct within the right pons, in keeping with reported left-sided weakness.  2. No acute hemorrhage, mass effect or hydrocephalus.   2D echocardiogram 01/16/2018 Study Conclusions - Left ventricle: The cavity size was mildly dilated. Systolic   function was mildly to moderately reduced. The estimated ejection   fraction was in the range of 40% to 45%. Diffuse hypokinesis.   Moderate hypokinesis of the inferior myocardium. Features are   consistent with a pseudonormal left ventricular filling pattern,   with concomitant abnormal relaxation and increased filling   pressure (grade 2 diastolic dysfunction). - Mitral valve: There was mild regurgitation. - Left atrium: The atrium was moderately dilated. - Atrial septum: No defect or patent foramen ovale was  identified. Impressions: - Reduced EF with global mild hypokinesis and moderate hypokinesis  inferiorly. Grade 2 diastolic dysfunction.    ASSESSMENT: Leah Norton is a 63 y.o. year old female here with right pontine infarct on 01/16/2018 secondary to small vessel disease. Vascular risk factors include HTN, HLD, DM and tobacco use.  Residual deficits of mild dysarthria and intermittent balance difficulties which worsens with prolonged ambulation    PLAN:  1. Right pons infarct: Continue clopidogrel 75 mg daily  and Lipitor for secondary stroke prevention.  Maintain strict control of hypertension with blood pressure goal below 130/90, diabetes with hemoglobin A1c goal below 6.5% and cholesterol with LDL cholesterol (bad cholesterol) goal below 70 mg/dL.  I also advised the patient to eat a healthy diet with plenty of whole grains, cereals, fruits and vegetables, exercise regularly with at least 30 minutes of continuous activity daily and maintain ideal body weight.  2. HTN: Advised to continue current treatment regimen. She has not been recently monitoring BP at home and was advised to do so and to follow-up with PCP for ongoing management 3. HLD: Advised to continue current treatment regimen along with continued follow-up with PCP for future prescribing and monitoring of lipid panel 4. DMII: Advised to continue to monitor glucose levels at home along with continued follow-up with PCP for management and monitoring 5. Possible sleep apnea: Discussion again at today's visit regarding importance of undergoing sleep evaluation for potential underlying sleep apnea.  She still has concerns regarding finances but will notify her office when able to pursue evaluation. 6. She was provided work letter to return to work with restrictions of daytime shift with no more than 8-hour at a time.  Discussion regarding if her current employer denies these restrictions, it would be recommended to look for a  different job that she would adequately be able to perform without restrictions.   Follow-up will be determined if unable to return to work otherwise follow-up as needed is overall stable from a stroke standpoint   Greater than 50% of time during this 25 minute visit was spent on counseling, explanation of diagnosis of right pons infarct, reviewing risk factor management of HLD, HTN, DM and tobacco use, return to work, planning of further management along with potential future management, and discussion with patient and family answering all questions.    Frann Rider, AGNP-BC  [facility] [address] [place], [place] [address]  Phone [phone] Fax [phone] Note: This document was prepared with digital dictation and possible smart phrase technology. Any transcriptional errors that result from this process are unintentional.

## 2018-11-20 ENCOUNTER — Telehealth: Payer: Self-pay | Admitting: *Deleted

## 2018-11-20 NOTE — Telephone Encounter (Signed)
Pt short term form faxed to [facility] on 11/20/18 [phone]

## 2018-11-20 NOTE — Progress Notes (Signed)
I agree with the above plan 

## 2018-11-28 ENCOUNTER — Ambulatory Visit: Payer: BC Managed Care – PPO | Admitting: Adult Health

## 2018-12-31 ENCOUNTER — Telehealth: Payer: Self-pay

## 2018-12-31 NOTE — Telephone Encounter (Signed)
Clearance form done by Dr. Leonie Man. Fax to [facility] at [phone]. Fax twice and confirmed.

## 2019-01-06 ENCOUNTER — Telehealth: Payer: Self-pay | Admitting: *Deleted

## 2019-01-06 ENCOUNTER — Telehealth: Payer: Self-pay

## 2019-01-06 NOTE — Telephone Encounter (Signed)
Left vm for patient to call back.

## 2019-01-06 NOTE — Telephone Encounter (Signed)
Pt returned call to speak to RN about her forms. Please call back when available.

## 2019-01-06 NOTE — Telephone Encounter (Signed)
LEft vm for patient to call back about disability form. VM was left at last visit she was given a letter to return to work on day shift at 8 hours a day.

## 2019-01-06 NOTE — Telephone Encounter (Signed)
Pt returning call please call back °

## 2019-01-06 NOTE — Telephone Encounter (Signed)
Received fax confirmation for [facility] [phone] clearance for colonoscopy.  Sent to MR.

## 2019-01-07 NOTE — Telephone Encounter (Signed)
It patient calls back please schedule her with Leah Norton per her request in December or January 2021 to discuss ongoing disability paperwork.

## 2019-01-07 NOTE — Telephone Encounter (Signed)
LEft vm for patient to call back about disability form and follow up appt needed with JEssica NP for December or January 2021.

## 2019-01-08 NOTE — Telephone Encounter (Signed)
I called pt about her not being to return to work. PT stated when she gave them the work letter it was denied. 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 The letter stated she can work 8 hours and only day shift. I stated the appt she schedule with Janett Billow NP is to evaluate her working capability. Pt stated she plans on finding another job. Pt will call us back if she is employed. I stated the disability form for Nov was done and given to Hilda Blades in medical records. Pt verbalized understanding.

## 2019-01-08 NOTE — Telephone Encounter (Signed)
Form done and signed by Janett Billow NP given to [place]. PEr Janett Billow NP pt needs a follow up appt to discuss her working capability and disability. Pt has no follow up appt.

## 2019-01-14 DIAGNOSIS — M19179 Post-traumatic osteoarthritis, unspecified ankle and foot: Secondary | ICD-10-CM | POA: Insufficient documentation

## 2019-01-14 DIAGNOSIS — T84498A Other mechanical complication of other internal orthopedic devices, implants and grafts, initial encounter: Secondary | ICD-10-CM | POA: Insufficient documentation

## 2019-02-03 ENCOUNTER — Other Ambulatory Visit: Payer: Self-pay | Admitting: Nurse Practitioner

## 2019-02-09 ENCOUNTER — Ambulatory Visit: Payer: BC Managed Care – PPO | Admitting: Adult Health

## 2019-02-09 ENCOUNTER — Encounter

## 2019-02-26 ENCOUNTER — Telehealth: Payer: Self-pay

## 2019-02-26 NOTE — Telephone Encounter (Signed)
Disability form completed until pts appt. Given to medical records. No charge for patient. The forms are done monthly.PT has paid for forms in the beginning.

## 2019-03-03 ENCOUNTER — Telehealth: Payer: Self-pay | Admitting: Adult Health

## 2019-03-03 NOTE — Telephone Encounter (Signed)
Faxed disability ppw to [phone] 12/22

## 2019-03-11 ENCOUNTER — Other Ambulatory Visit: Payer: Self-pay | Admitting: Interventional Cardiology

## 2019-03-22 ENCOUNTER — Telehealth: Payer: Self-pay | Admitting: *Deleted

## 2019-03-22 NOTE — Telephone Encounter (Signed)
LMVM for pt mobile,  that calling to convert appt to virtual visit. I will cancel for tomorrow since did not reach.  Saved appt for Tuesday 1/12 at [place].  Pt to call back.

## 2019-03-23 ENCOUNTER — Ambulatory Visit: Payer: BC Managed Care – PPO | Admitting: Adult Health

## 2019-03-24 ENCOUNTER — Telehealth: Payer: Self-pay

## 2019-03-24 NOTE — Telephone Encounter (Signed)
Unable to get in contact with the patient. LVM letting her know that her appt needs to be converted into a mychart video visit due to Janett Billow being out of the office. Office number was provided.   If patient calls back please convert her office visit to a mychart video visit.

## 2019-03-24 NOTE — Telephone Encounter (Signed)
LVM to convert office visit into a mychart video visit

## 2019-03-25 ENCOUNTER — Ambulatory Visit: Payer: BC Managed Care – PPO | Admitting: Adult Health

## 2019-04-07 ENCOUNTER — Telehealth: Payer: Self-pay | Admitting: *Deleted

## 2019-04-07 NOTE — Telephone Encounter (Signed)
I called pt home/mobile LMVM for her that needs to have appt before filling out forms that she has dropped off.  I have several openings available tomorrow with JM/NP.  Can use any openings (better to come in office as last time, mychart did not work).  Please call soon, as openings fill up.

## 2019-04-09 ENCOUNTER — Encounter: Payer: Self-pay | Admitting: Adult Health

## 2019-04-09 ENCOUNTER — Other Ambulatory Visit: Payer: Self-pay

## 2019-04-09 ENCOUNTER — Ambulatory Visit: Payer: BC Managed Care – PPO | Admitting: Adult Health

## 2019-04-09 VITALS — BP 161/91 | HR 87 | Temp 97.1°F | Ht 66.0 in | Wt 186.0 lb

## 2019-04-09 DIAGNOSIS — I69398 Other sequelae of cerebral infarction: Secondary | ICD-10-CM | POA: Diagnosis not present

## 2019-04-09 DIAGNOSIS — I69322 Dysarthria following cerebral infarction: Secondary | ICD-10-CM | POA: Diagnosis not present

## 2019-04-09 DIAGNOSIS — Z7689 Persons encountering health services in other specified circumstances: Secondary | ICD-10-CM | POA: Diagnosis not present

## 2019-04-09 DIAGNOSIS — R4189 Other symptoms and signs involving cognitive functions and awareness: Secondary | ICD-10-CM | POA: Diagnosis not present

## 2019-04-09 DIAGNOSIS — R2689 Other abnormalities of gait and mobility: Secondary | ICD-10-CM

## 2019-04-09 NOTE — Patient Instructions (Addendum)
Continue aspirin 81 mg daily  and Crestor  for secondary stroke prevention  Continue to follow up with PCP regarding cholesteorl and blood pressure management   recommend functional capacity assessment/evalatuion and neuro cognitive evaluation - both for potential return to work. You will be called to schedule evaluations. We wil continue to assist with STD paperwork until evaluations are completed which should be prior to your return visit  Please provide office with job description paperwork as well as bring this paperwork with you to functional capacity assessment   Continue to monitor blood pressure at home  Maintain strict control of hypertension with blood pressure goal below 130/90, diabetes with hemoglobin A1c goal below 6.5% and cholesterol with LDL cholesterol (bad cholesterol) goal below 70 mg/dL. I also advised the patient to eat a healthy diet with plenty of whole grains, cereals, fruits and vegetables, exercise regularly and maintain ideal body weight.  Followup in the future with me in 3 months or call earlier if needed       Thank you for coming to see [place] at [facility]. I hope we have been able to provide you high quality care today.  You may receive a patient satisfaction survey over the next few weeks. We would appreciate your feedback and comments so that we may continue to improve ourselves and the health of our patients.

## 2019-04-09 NOTE — Progress Notes (Signed)
I agree with the above plan 

## 2019-04-09 NOTE — Progress Notes (Signed)
[facility] [address] [place]. [place] [address] [phone]       OFFICE FOLLOW UP NOTE  Ms. 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 Leonie Man  CHIEF COMPLAINT:  Chief Complaint  Patient presents with  . Follow-up    RM9. Alone. No changes. No concerns.    HPI:   Update 04/09/2019: Ms. Woolf is being seen today for stroke follow up and request for ongoing short-term disability versus possible return to work.  After prior visit, attempted to have patient return to work with dayshift hours for max of 8-hour shifts but apparently she was not able to return to work due to her employer denying recommended restrictions.  Katharine Look, RN spoke to patient and she was planning on looking for a different type of job she has not found one at this time. She continues to complain of intermittent imbalance that can worsen at certain times a day, difficulty with walking long distances due to shortness of breath, dysarthria wax/wane and delayed recall/short-term memory concerns.  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  Shortness of breath on exertion has been slowly worsening but she does endorse sedentary lifestyle with no routine exercise and limited activity throughout the day.  Continues on Plavix and Crestor for secondary stroke prevention without side effects.  Blood pressure today elevated at 161/91, asymptomatic, but does monitor at home and typically stable.  Denies new or worsening stroke/TIA symptoms.   Update 11/19/2018: Ms. Franczak is being seen today for stroke follow-up accompanied by her sister.  Residual deficits mild dysarthria, short term memory deficits, and occasional balance difficulties.  She continues to live  independently maintaining ADLs and IADLs without difficulty.  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  It was recommended to return to work with no more than 8-hour shifts during the day but unfortunately her current employer was unable to satisfy these recommendations.  Our office has been assisting with disability.  She is asking for another work letter with my recommendations as they were unable to satisfy before due to beginning of pandemic.  Continues on Plavix and Crestor for secondary stroke prevention without side effects.  Blood pressure stable at 136/74.  She does have concerns regarding right ankle swelling which is chronic and currently being followed by PCP regarding this concern.  Denies new or worsening stroke/TIA symptoms.   Update 05/28/2018: Ms. Rochefort is a 64 year old female who is being seen today for follow-up visit regarding right pontine infarct in 01/2018 and is accompanied by her sister.  She continues to have difficulties intermittently with dysarthria but has had improvement of gait and balance difficulties.  She unfortunately was unable to continue to participate in therapy sessions due to financial reasons.  She does endorse continuation of exercises at home.  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  She currently works 12-hour night shifts.  She continues on Plavix without side effects of bleeding or bruising.  She continues on rosuvastatin without side effects myalgias.  Blood pressure  today 152/78.  Patient does endorse mild headache over the past couple days located left occipital region but denies any other neurological symptoms.  Denies new or worsening stroke/TIA symptoms.  Initial visit 02/26/18: Patient is being seen today for hospital follow-up and is  accompanied by her sister.  Patient continues to have left hemiparesis but has been improving along with intermittent balance difficulties and is currently participating in PT/OT.  She also continues to have dysarthria with occasional aphasia and cognitive slowing but continues to participate in speech therapy.  She has since returned home where she is able to complete all her ADLs and some assistance with IADLs from her sister.  She has continued on aspirin and Plavix without side effects of bleeding or bruising.  Continues on Crestor without side effects of myalgias.  Blood pressure today 159/88 and patient does monitor at home with typical SBP 120-130.  She has completely quit smoking since hospital discharge.  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  She is currently receiving FMLA which was provided by PCP.  She does endorse snoring with insomnia and daytime fatigue which has been present prior to her stroke but does endorse worsening fatigue since hospital discharge.  She denies prior sleep study.  She is also been having left side ear pains which was also present prior to her stroke and has been seen by ENT and per patient, she was told everything was satisfactory.  She does endorse neck pain on left side with some radiating pain up to her ear and a discomfort surrounding her ear up into her head occasionally causing headaches.  No further concerns at this time.  Denies new or worsening stroke/TIA symptoms.  Stroke admission 01/15/2018: Ms. KRUPA DIANTONIO is a 64 y.o. female with history of CAD s/p DES to pLAD in 12/17, hypercholesterolemia, HTN, DM2 and smoking   who presented with slurred speech, balance problems, AMS, and left sided weakness. She did not receive IV t-PA due to late presentation.   CT head reviewed and negative for acute infarct.  MRI brain reviewed and showed subcentimeter acute early subacute infarct within the right pons.   CTA head and neck showed a 50 to 75% stenosis proximal right M1 MCA which was noncontributory with respect to the acute ischemia.  2D echo showed an EF of 40 to 45%.  LDL 77 and A1c 6.6.  Patient was on aspirin 81 mg PTA and recommended DAPT for 3 weeks on Plavix alone.  Etiology of infarct likely due to small vessel disease source.  Recommended to continue to follow with PCP in regards to HTN, HLD and DM management along with smoking cessation counseling provided.  Patient discharged home in stable condition with recommendations of outpatient PT.     ROS:   14 system review of systems performed and negative with exception of memory loss, imbalance, shortness of breath, swelling and speech difficulty   PMH:  Past Medical History:  Diagnosis Date  . Anginal pain ([place])   . Arthritis    "right anikle" (02/29/2016)  . CAD in native artery 02/08/2016   Three-vessel coronary artery calcification on CT scan  . Complication of anesthesia    "had reaction w/hives, rash, itching" (02/29/2016)  . Constipation   . Coronary artery disease    12/17 PCI with DESx1 to pLAD EF normal  . Enlarged heart   . GERD (gastroesophageal reflux disease)   .  High cholesterol   . Hypertension   . Migraine    hx  . Stroke ([place])   . Symptomatic anemia 07/10/2016  . Type II diabetes mellitus ([facility])     PSH:  Past Surgical History:  Procedure Laterality Date  . ABDOMINAL HYSTERECTOMY    . CARDIAC CATHETERIZATION N/A 02/29/2016   Procedure: Left Heart Cath and Coronary Angiography;  Surgeon: Belva Crome, MD;  Location: [facility];  Service: Cardiovascular;  Laterality: N/A;  . CARDIAC CATHETERIZATION N/A 02/29/2016   Procedure: Coronary Stent Intervention;  Surgeon: Belva Crome, MD;  Location: [facility];  Service: Cardiovascular;  Laterality: N/A;  . CARDIAC CATHETERIZATION  2005   "no blockages"  . COLONOSCOPY WITH PROPOFOL N/A 03/15/2014   Procedure: COLONOSCOPY WITH PROPOFOL;  Surgeon:  Garlan Fair, MD;  Location: [facility];  Service: Endoscopy;  Laterality: N/A;  . CORONARY ANGIOPLASTY WITH STENT PLACEMENT    . ESOPHAGOGASTRODUODENOSCOPY N/A 07/11/2016   Procedure: ESOPHAGOGASTRODUODENOSCOPY (EGD);  Surgeon: Laurence Spates, MD;  Location: [facility];  Service: Endoscopy;  Laterality: N/A;  . FRACTURE SURGERY    . MULTIPLE TOOTH EXTRACTIONS     teeth removed  . ORIF ANKLE FRACTURE Right 11/12/2012   Procedure: OPEN REDUCTION INTERNAL FIXATION (ORIF) ANKLE FRACTURE;  Surgeon: Marianna Payment, MD;  Location: [facility];  Service: Orthopedics;  Laterality: Right;  Open reduction internal fixation ankle fracture, trimalleolar    Social History:  Social History   Socioeconomic History  . Marital status: Single    Spouse name: Not on file  . Number of children: Not on file  . Years of education: Not on file  . Highest education level: Not on file  Occupational History  . Not on file  Tobacco Use  . Smoking status: Former Smoker    Packs/day: 0.50    Years: 42.00    Pack years: 21.00    Types: Cigarettes    Quit date: 01/15/2018    Years since quitting: 1.2  . Smokeless tobacco: Never Used  Substance and Sexual Activity  . Alcohol use: Yes    Comment: occasionally  . Drug use: No  . Sexual activity: Not on file  Other Topics Concern  . Not on file  Social History Narrative  . Not on file   Social Determinants of Health   Financial Resource Strain:   . Difficulty of Paying Living Expenses: Not on file  Food Insecurity:   . Worried About Charity fundraiser in the Last Year: Not on file  . Ran Out of Food in the Last Year: Not on file  Transportation Needs:   . Lack of Transportation (Medical): Not on file  . Lack of Transportation (Non-Medical): Not on file  Physical Activity:   . Days of Exercise per Week: Not on file  . Minutes of Exercise per Session: Not on file  Stress:   . Feeling of Stress : Not on file  Social Connections:   . Frequency of  Communication with Friends and Family: Not on file  . Frequency of Social Gatherings with Friends and Family: Not on file  . Attends Religious Services: Not on file  . Active Member of Clubs or Organizations: Not on file  . Attends Archivist Meetings: Not on file  . Marital Status: Not on file  Intimate Partner Violence:   . Fear of Current or Ex-Partner: Not on file  . Emotionally Abused: Not on file  . Physically Abused:  Not on file  . Sexually Abused: Not on file    Family History:  Family History  Problem Relation Age of Onset  . Hypertension Mother     Medications:   Current Outpatient Medications on File Prior to Visit  Medication Sig Dispense Refill  . buPROPion (WELLBUTRIN SR) 150 MG 12 hr tablet Take 150 mg by mouth 2 (two) times daily.     . Cholecalciferol (VITAMIN D3) 1000 units CAPS Take 2,000 Units by mouth daily.    . clopidogrel (PLAVIX) 75 MG tablet TAKE 1 TABLET(75 MG) BY MOUTH DAILY 90 tablet 0  . cyclobenzaprine (FLEXERIL) 5 MG tablet Take 1 tablet (5 mg total) by mouth at bedtime. (Patient taking differently: Take 5 mg by mouth at bedtime. "Taking as needed") 14 tablet 0  . furosemide (LASIX) 20 MG tablet TAKE 1 TABLET(20 MG) BY MOUTH DAILY AS NEEDED 30 tablet 0  . Multiple Vitamin (MULTIVITAMIN) tablet Take 1 tablet by mouth daily. OTC    . pantoprazole (PROTONIX) 40 MG tablet TAKE 1 TABLET BY MOUTH EVERY DAY 90 tablet 1  . rosuvastatin (CRESTOR) 10 MG tablet Take 10 mg by mouth daily.    . sitaGLIPtin-metformin (JANUMET) 50-1000 MG per tablet Take 1 tablet by mouth 2 (two) times daily with a meal.    . Zinc Sulfate (ZINC 15 PO) Take by mouth.    Marland Kitchen atenolol (TENORMIN) 50 MG tablet Take 1 tablet (50 mg total) by mouth daily. 180 tablet 3  . losartan (COZAAR) 100 MG tablet Take 1 tablet (100 mg total) by mouth daily. 90 tablet 3   No current facility-administered medications on file prior to visit.    Allergies:   Allergies  Allergen Reactions    . Codeine Rash  . Erythromycin Rash  . Penicillins Rash    Has patient had a PCN reaction causing immediate rash, facial/tongue/throat swelling, SOB or lightheadedness with hypotension: Yes Has patient had a PCN reaction causing severe rash involving mucus membranes or skin necrosis: No Has patient had a PCN reaction that required hospitalization: No Has patient had a PCN reaction occurring within the last 10 years: Yes If all of the above answers are "NO", then may proceed with Cephalosporin use.   . Sulfa Antibiotics Rash  . Sulfonamide Derivatives Rash  . Vicodin [Hydrocodone-Acetaminophen] Rash     Physical Exam  Vitals:   04/09/19 0837  BP: (!) 161/91  Pulse: 87  Temp: (!) 97.1 F (36.2 C)  Weight: 186 lb (84.4 kg)  Height: 5\' 6"  (1.676 m)   Body mass index is 30.02 kg/m. No exam data present  General: well developed, well nourished, pleasant middle-aged African-American female, seated, in no evident distress Head: head normocephalic and atraumatic.   Neck: supple with no carotid or supraclavicular bruits Cardiovascular: regular rate and rhythm, no murmurs Musculoskeletal: no deformity Skin:  no rash/petichiae Vascular:  Normal pulses all extremities  Neurologic Exam Mental Status: Awake and fully alert.  Mild dysarthria.  Oriented to place and time. Recent memory subjectively diminished and remote memory intact. Attention span, concentration and fund of knowledge appropriate. Mood and affect appropriate.  Cranial Nerves: Pupils equal, briskly reactive to light. Extraocular movements full without nystagmus. Visual fields full to confrontation. Hearing intact. Facial sensation intact. Face, tongue, palate moves normally and symmetrically.  Motor: Normal bulk and tone.  Full strength throughout all tested extremities Sensory.: intact to touch , pinprick , position and vibratory sensation.  Coordination: Rapid alternating movements normal in all extremities.  Finger-to-nose and heel-to-shin performed accurately bilaterally. Gait and Station: Arises from chair without difficulty. Stance is normal. Gait demonstrates normal stride length and balance. Able to heel, toe and tandem walk with difficulty.   Reflexes: 1+ and symmetric. Toes downgoing.       Diagnostic Data (Labs, Imaging, Testing)  Ct Angio Head W Or Wo Contrast Ct Angio Neck W Or Wo Contrast 01/16/2018 IMPRESSION:  RIGHT paramedian brainstem infarct is not visible on pre or postcontrast CT imaging. No visible hemorrhagic transformation. No posterior circulation/basilar artery stenosis or dissection is observed. Minor calcific atheromatous change of the BILATERAL cavernous carotid arteries. 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   Mr Brain Wo Contrast 01/16/2018 IMPRESSION:  1. Subcentimeter acute or early subacute infarct within the right pons, in keeping with reported left-sided weakness.  2. No acute hemorrhage, mass effect or hydrocephalus.   2D echocardiogram 01/16/2018 Study Conclusions - Left ventricle: The cavity size was mildly dilated. Systolic   function was mildly to moderately reduced. The estimated ejection   fraction was in the range of 40% to 45%. Diffuse hypokinesis.   Moderate hypokinesis of the inferior myocardium. Features are   consistent with a pseudonormal left ventricular filling pattern,   with concomitant abnormal relaxation and increased filling   pressure (grade 2 diastolic dysfunction). - Mitral valve: There was mild regurgitation. - Left atrium: The atrium was moderately dilated. - Atrial septum: No defect or patent foramen ovale was identified. Impressions: - Reduced EF with global mild hypokinesis and moderate hypokinesis   inferiorly. Grade 2 diastolic dysfunction.    ASSESSMENT: TERRI PELE is a 64 y.o.  year old female here with right pontine infarct on 01/16/2018 secondary to small vessel disease. Vascular risk factors include HTN, HLD, DM and tobacco use.  Residual stroke deficits of intermittent imbalance, wax/wane dysarthria and short-term memory impairment/delayed recall.      PLAN:  1. Right pons infarct: Continue clopidogrel 75 mg daily  and Lipitor for secondary stroke prevention.  Maintain strict control of hypertension with blood pressure goal below 130/90, diabetes with hemoglobin A1c goal below 6.5% and cholesterol with LDL cholesterol (bad cholesterol) goal below 70 mg/dL.  I also advised the patient to eat a healthy diet with plenty of whole grains, cereals, fruits and vegetables, exercise regularly with at least 30 minutes of continuous activity daily and maintain ideal body weight.  2. HTN: Advised to continue current treatment regimen and ongoing follow-up with PCP for monitoring and management 3. HLD: Advised to continue current treatment regimen along with continued follow-up with PCP for future prescribing and monitoring of lipid panel 4. DMII: Advised to continue to monitor glucose levels at home along with continued follow-up with PCP for management and monitoring 5. Residual deficits, poststroke: Ongoing deficits of imbalance, dysarthria and short-term memory concerns and continues to receive short-term disability previously working as Land at [employer].  Discussion previously regarding returning to work with restrictions of 8-hour shift during the day as previously working 12-hour night shifts but apparently work was not able to accommodate restrictions.  Recommend pursuing FCE and neurocognitive evaluation for further assessment of residual deficits and potential return to work including any recommended restrictions.  We have also discussed previously as she may not be able to return to her current position, she may proceed to look for work elsewhere.  Advised her that  we will  continue to assist with short-term disability until further evaluations are completed which should be prior to follow-up visit.  We will also assist in completing fit for duty form as requested by her employer HR department but advised her to provide [place] with job description prior to completion of paperwork 6. SOB on exertion and ankle swelling: Defer to PCP for further evaluation   Follow-up in 3 months or call earlier if needed   Greater than 50% of time during this 30 minute visit was spent on counseling, explanation of diagnosis of right pons infarct, reviewing risk factor management of HLD, HTN, DM and tobacco use, discussion regarding residual deficits and possible return to work, planning of further management along with potential future management, and discussion with patient answering all questions to satisfaction    Frann Rider, AGNP-BC  [facility] [address] [place], [place] [address]  Phone [phone] Fax [phone] Note: This document was prepared with digital dictation and possible smart phrase technology. Any transcriptional errors that result from this process are unintentional.

## 2019-04-14 NOTE — Telephone Encounter (Signed)
I called pt and LMVM for her that was waiting for her job description for [place] filling out the Fitness of Duty form.  I have competed the STD form, waiting on this.

## 2019-04-20 ENCOUNTER — Telehealth: Payer: Self-pay | Admitting: *Deleted

## 2019-04-20 NOTE — Telephone Encounter (Signed)
Pt [place] total form faxed to [phone]

## 2019-04-20 NOTE — Telephone Encounter (Signed)
I faxed pt fitness for duty form on 04/20/19 to [phone]

## 2019-04-21 ENCOUNTER — Ambulatory Visit: Payer: BC Managed Care – PPO | Admitting: Counselor

## 2019-04-21 ENCOUNTER — Encounter: Payer: Self-pay | Admitting: Counselor

## 2019-04-21 ENCOUNTER — Ambulatory Visit: Payer: BC Managed Care – PPO

## 2019-04-21 ENCOUNTER — Telehealth: Payer: Self-pay | Admitting: Physical Therapy

## 2019-04-21 ENCOUNTER — Other Ambulatory Visit: Payer: Self-pay

## 2019-04-21 DIAGNOSIS — F819 Developmental disorder of scholastic skills, unspecified: Secondary | ICD-10-CM

## 2019-04-21 DIAGNOSIS — I999 Unspecified disorder of circulatory system: Secondary | ICD-10-CM

## 2019-04-21 DIAGNOSIS — Z8673 Personal history of transient ischemic attack (TIA), and cerebral infarction without residual deficits: Secondary | ICD-10-CM

## 2019-04-21 DIAGNOSIS — F01A0 Vascular dementia, mild, without behavioral disturbance, psychotic disturbance, mood disturbance, and anxiety: Secondary | ICD-10-CM

## 2019-04-21 DIAGNOSIS — F0150 Vascular dementia without behavioral disturbance: Secondary | ICD-10-CM

## 2019-04-21 NOTE — Telephone Encounter (Signed)
No answer. Message left. 

## 2019-04-21 NOTE — Progress Notes (Signed)
   Psychometrician Note   Cognitive testing was administered to Leah Norton by technician Cruzita Lederer, B.S. under the supervision of Dr. Alphonzo Severance, Psy.D., ABN, licensed psychologist. [name] did not appear overtly distressed by the testing session, per behavioral observation or via self-report to the technician. Rest breaks were offered.    In considering the patient's current level of functioning, level of presumed impairment, nature of symptoms, emotional and behavioral responses during the interview, level of literacy, and observed level of motivation/effort, a battery of tests was selected and communicated to the psychometrician.   Communication between the psychologist and technician was ongoing throughout the testing session and changes were made as deemed necessary based on patient performance on testing, technician observations and additional pertinent factors such as those listed above.   Leah Norton will return within approximately two weeks for an interactive feedback session with Dr. Nicole Kindred at which time her test performances, clinical impressions, and treatment recommendations will be reviewed in detail. The patient understands she can contact our office should she require our assistance before this time.  95 minutes were spent face-to-face with [name] administering standardized tests. An additional 30 minutes were spent scoring by the technician. [CPT [id number], [id number]  This note reflects time spent with the psychometrician and does not include test scores or any clinical interpretations made by Dr. Nicole Kindred. The full report will follow in a separate note.

## 2019-04-21 NOTE — Progress Notes (Signed)
[facility]  Patient Name: Leah Norton MRN: [id number] Date of Birth: [birth year] Age: 64 y.o. Education: 16 years  Referral Circumstances and Background Information  Leah Norton is a 64 y.o., right-hand dominant, single woman who was referred by Frann Rider, NP and Dr. Leonie Man with [facility]. 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 She was in the hospital for three days and was discharged with homehealth. Since that time, she has continued to experience intermittent imbalance that worsens at certain times of day, mild dysarthria, and subjective short-term memory problems. She was referred for return to work evaluation (patient works as a Presenter, broadcasting at [employer].   On interview, the patient reported that she appreciated some difficulties with "walking and talking" and memory and thinking after she got home from the hospital. She feels like those symptoms have improved but she has not regained her previous baseline. She thinks she is about 80% improved, with respect to her cognitive symptoms. She stated that it would be her preference to return to work, with modifications, which is what has been recommended by Dr. Leonie Man and Ms. McCue. She reported that she does continue to have problems with memory, but she didn't provide many examples. She said she could forget "anything" but most of the time, she is able to eventually think of what she is looking for if she gives herself time. She stated that she "sometimes" has a hard time remembering things that people tell her and conversations she has had, she is more dependent on calendars than she used to be to track appointments, and she doesn't forget medications but she uses a pill Environmental education officer. She doesn't  notice much in the way of problems with attention and concentration, such as when watching TV or in conversations. She doesn't really notice much in the way of problems with multitasking. Affectively, she denied feeling sad or depressed despite all the recent changes in her life; it sounds like she is focused on improvement and getting better. She reported that she has adequate energy and feels as though she has adequate motivation. She then later stated that there are "some days where I feel like I can't do anything," so it's not entirely clear. She doesn't sleep well though, which has always been an issue for her (she has worked the night shift for a long time). She estimated that she gets between 4-6 hours of sleep per night and doesn't feel rested like she should. She also naps a fair amount (1-4 hours), she stated that she tries to sleep when she feels like sleeping.   I was able to talk with her sister to gather collateral history and status information who reported that she has in fact noticed some changes. She stated that her sister has never been a "fast" person and has always been a bit slow person and she is slower since the stroke. She stated that she has noticed her having memory problems, and those seem in excess of problems that she has had in the past. They went to get communion at church, to be taken on Sunday, and the patient thought she was supposed to take it right then and there. She also feels that she talks slower and moves slower than in the past. She stated that she also helps her now with  finances since the stroke, because she has a hard time remembering how to do everything online. The patient used to do her banking in person. She also got her a planner and a calendar to help her remember.   With respect to other changes, she reported that she has no problems with swallowing or eating. She does have some problems with coordination (but no falls), she will feel off balance and that is  intermittent and could happen "any time." She doesn't appreciate much in the way of problems with manual dexterity, such as tying her shoes, doing buttons, or with handwriting. With respect to functioning, the patient stated that she is still fully independent, she is managing finances, driving without issues, managing her medications. She cooks for herself and is doing adequately with that. She largely denied that there are any things that she feels like she can't do or that are very hard for her to do now as compared to before the stroke.  Past Medical History and Review of Relevant Studies   Patient Active Problem List   Diagnosis Date Noted  . Acute ischemic stroke ([place]) 01/16/2018  . Acute CVA (cerebrovascular accident) ([place]) 01/16/2018  . GI bleed 07/10/2016  . Morbid obesity due to excess calories ([place]) 05/14/2016  . Dyspnea on exertion 04/29/2016  . Abnormal nuclear stress test   . CAD S/P percutaneous coronary angioplasty 02/08/2016  . INSOMNIA, CHRONIC 02/28/2010  . HEADACHE 02/28/2010  . HEEL PAIN, RIGHT 11/29/2009  . COUGH DUE TO ACE INHIBITORS 11/29/2009  . ATRIAL ENLARGEMENT, LEFT 03/21/2009  . ANEMIA 12/28/2008  . CARDIAC MURMUR 12/28/2008  . OTHER NONSPECIFIC ABNORMAL SERUM ENZYME LEVELS 12/28/2008  . CERVICAL MUSCLE STRAIN 12/28/2008  . CONSTIPATION 06/01/2008  . POSTMENOPAUSAL STATUS 05/15/2008  . BRONCHITIS, CHRONIC 12/18/2007  . Cigarette smoker 08/19/2007  . DEGENERATIVE DISC DISEASE, LUMBOSACRAL SPINE 08/19/2007  . LOW BACK PAIN SYNDROME 08/19/2007  . UPPER RESPIRATORY INFECTION, VIRAL 03/05/2007  . NEOPLASM, SKIN, UNCERTAIN BEHAVIOR 123XX123  . GANGLION CYST, WRIST, LEFT 01/28/2007  . DENTAL PAIN 01/19/2007  . DM (diabetes mellitus), type 2, uncontrolled with complications ([place]) 123XX123  . Hyperlipidemia with target LDL less than 70 12/10/2006  . Essential hypertension 12/10/2006  . RHINITIS, ALLERGIC NEC 12/10/2006  . HIP PAIN, RIGHT 12/10/2006   The  patient denied any history of strokes (other than the one that brought her to this appointment), seizures, or significant head injuries.    Review of Neuroimaging and Relevant Studies:  The patient has an MRI brain from 01/16/2018 on which her stroke was discovered, it was acute to early subacute at that time, measured approximately 4mm in the right paramedian pons. On personal review of the images, there is also mild volume loss, and there is no significant leukoaraiosis.   Current Outpatient Medications  Medication Sig Dispense Refill  . atenolol (TENORMIN) 50 MG tablet Take 1 tablet (50 mg total) by mouth daily. 180 tablet 3  . buPROPion (WELLBUTRIN SR) 150 MG 12 hr tablet Take 150 mg by mouth 2 (two) times daily.     . Cholecalciferol (VITAMIN D3) 1000 units CAPS Take 2,000 Units by mouth daily.    . clopidogrel (PLAVIX) 75 MG tablet TAKE 1 TABLET(75 MG) BY MOUTH DAILY 90 tablet 0  . cyclobenzaprine (FLEXERIL) 5 MG tablet Take 1 tablet (5 mg total) by mouth at bedtime. (Patient taking differently: Take 5 mg by mouth at bedtime. "Taking as needed") 14 tablet 0  . furosemide (LASIX) 20 MG tablet TAKE 1  TABLET(20 MG) BY MOUTH DAILY AS NEEDED 30 tablet 0  . losartan (COZAAR) 100 MG tablet Take 1 tablet (100 mg total) by mouth daily. 90 tablet 3  . Multiple Vitamin (MULTIVITAMIN) tablet Take 1 tablet by mouth daily. OTC    . pantoprazole (PROTONIX) 40 MG tablet TAKE 1 TABLET BY MOUTH EVERY DAY 90 tablet 1  . rosuvastatin (CRESTOR) 10 MG tablet Take 10 mg by mouth daily.    . sitaGLIPtin-metformin (JANUMET) 50-1000 MG per tablet Take 1 tablet by mouth 2 (two) times daily with a meal.    . Zinc Sulfate (ZINC 15 PO) Take by mouth.     No current facility-administered medications for this visit.    Family History  Problem Relation Age of Onset  . Hypertension Mother    There is no  family history of dementia. There is no  family history of psychiatric illness. She denied any family history of  strokes. Her mother passed at age 2 (she "broke her hip and never recovered") and her father died at age 82 (lung issues).   Psychosocial History  Developmental, Educational and Employment History: The patient reported a normal childhood development and denied any history of abuse or neglect. [place]. She stated that she was "good to average" in school, and earned mainly A's, B's, and C's but sometimes got Ds. She stated that she has never been good at math. 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 She also required special help in math (she described it as a Product manager but did not do special classes). 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 She has been working as a Presenter, broadcasting for around 8 years. 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   Psychiatric History: The patient denied any history of mental health problems, psychotropic medication use, or specialty mental health consultation.   Substance Use History: The patient denied any history of alcohol or illicit drug use. She smoked cigarettes for much of her life, from high school until she had her stroke, and she no longer smokes.   Relationship History and Living Cimcumstances: The patient has never been married and has no children. She is quite involved with her nieces and nephews children.   Mental Status and Behavioral Observations  Sensorium/Arousal: The patient's level of arousal was awake and alert.  Orientation: The patient was fully oriented to person, place, time, and situation.  Appearance: Dressed appropriately in casual clothing with adequate grooming and hygiene.  Behavior: The patient was appropriately interactive throughout the encounter. She did seem to become confused easily with certain verbal  instructions and had a hard time providing very detailed information in response to questions. Also noted to have a hard time with organization (e.g., collecting her items when transferring rooms, could not find cell phone). Speech/language: The patient's speech was at times unclear, although I'm not certain it was dysarthria and question developmental issues (when I asked, she said some words would have been difficult for her to pronounce before the stroke). There were occasional word-finding pauses.  Gait/Posture: Appeared normal on casual observation of ambulation within the clinic.  Movement: No tremors or other motor abnormalities noted.  Social Comportment: Appropriate Mood: Patient stated "I try to be happy."  Affect: Mainly neutral to euthymic Thought  process/content: Thought process was mainly logical and goal-oriented, but she  Safety: Any thoughts of harming self or others were denied by the patient.  Insight: Unclear, patient appears to demonstrate some difficulties but reports few.   Montreal Cognitive Assessment  04/21/2019  Visuospatial/ Executive (0/5) 3  Naming (0/3) 2  Attention: Read list of digits (0/2) 1  Attention: Read list of letters (0/1) 1  Attention: Serial 7 subtraction starting at 100 (0/3) 0  Language: Repeat phrase (0/2) 1  Language : Fluency (0/1) 1  Abstraction (0/2) 1  Delayed Recall (0/5) 1  Orientation (0/6) 6  Total 17  Adjusted Score (based on education) 17   Test Procedures  Wide Range Achievement Test - 4   Word Reading Wechsler Adult Intelligence Scale - IV  Digit Span  Arithmetic  Symbol Search  Coding  Information  Block Design Repeatable Battery for the Assessment of Neuropsychological Status (Form A) The Dot Counting Test A Random Letter Test Controlled Oral Word Association (F-A-S) Semantic Fluency (Animals) Trail Making Test A & B Wisconsin Card Sorting Test - 64 Patient Health Questionnaire - 9  GAD-7  Plan  This is a very  pleasant 64 year old, right-hand dominant woman with a history of right paramedian pontine infarction and some subjective memory and other cognitive problems. On interview, she reports that she feels back to her baseline but preliminary review of her test findings reveals a number of low scores and she did quite poorly on the MoCA. Her sister also has noticed definitive changes. Ms. Lanell Matar does report a history suggestive of learning disabilities, and also had a speech impediment as a child. My sense is that her presentation on testing is mainly due to premorbid factors but she may also have some changes related to her stroke. Significant and severe cognitive consequences are not classically associated with the type of lesion that she has, although pontine infarcts can at times be associated with cognitive consequences and there is emerging evidence that the pons is involved in cortico-ponto-cerebellar circuits important for cognition.   Viviano Simas Nicole Kindred, PsyD, [place] Clinical Neuropsychologist  Informed Consent and Coding/Compliance  Risks and benefits of the evaluation were discussed with the patient as were the limits of confidentiality. I conducted a clinical interview and neuropsychological testing (more than two tests) with [name] and Lamar Benes, B.S. (Technician) assisted me in administering additional test procedures. The patient was able to tolerate the testing procedures and the patient (and/or family if applicable) is likely to benefit from further follow up to receive the diagnosis and treatment recommendations, which will be rendered at our next encounter. Billing below reflects technician time, my direct face-to-face time with the patient, time spent in test administration, and time spent in professional activities including but not limited to: neuropsychological test interpretation, integration of neuropsychological test data with clinical history, report preparation,  treatment planning, care coordination, and review of diagnostically pertinent medical history or studies.   Services associated with this encounter: Clinical Interview [id number]) plus 60 minutes [id number]; Neuropsychological Evaluation by Professional)  180 minutes [id number]; Neuropsychological Evaluation by Professional, Adl.) 30 minutes [id number]; Test Administration by Professional) 30 minutes [id number]; Neuropsychological Testing by Technician) 95 minutes [id number]; Neuropsychological Testing by Technician, Adl.)

## 2019-04-27 NOTE — Progress Notes (Signed)
     [facility]  Patient Name: Leah Norton MRN: [id number] Date of Birth: Apr 04, 1955 Age: 64 y.o. Education: 16 years  Measurement properties of test scores: IQ, Index, and Standard Scores (SS): Mean = 100; Standard Deviation = 15 Scaled Scores (Ss): Mean = 10; Standard Deviation = 3 Z scores (Z): Mean = 0; Standard Deviation = 1 T scores (T); Mean = 50; Standard Deviation = 10  TEST SCORES:    Note: This summary of test scores accompanies the interpretive report and should not be considered in isolation without reference to the appropriate sections in the text. Test scores are relative to age, gender, and educational history as available and appropriate.   Validity Testing        A Random Letter Test Raw Descriptor      Errors 0 Within Expectation      The Dot Counting Test: Raw Descriptor      E-Score 13 Within Expectation      Embedded Measures: Raw Descriptor      RBANS Effort Index: 3 Marginal      WAIS-IV Reliable Digit Span: 7 Within Expectation      Expected Functioning        Wide Range Achievement Test (Word Reading) Standard/Scaled Score Percentile      Word Reading 80 9  Test of Premorbid Functioning: 50 <1  Wechsler Adult Intelligence Scale - IV       Information 5 5     Block Design 4 2  Short Form IQ Estimate 65 1      Cognitive Testing        RBANS, Form : Standard/Scaled Score Percentile  Total Score 66 1  Immediate Memory 94 34  List Learning 7 16  Story Memory 11 63  Visuospatial/Constructional 64 1  Figure Copy 5 5  Line Orientation --- <2  Language 71 3  Picture Naming --- 3-9  Semantic Fluency 5 5  Attention 60 <1  Digit Span 5 5  Coding 3 1  Delayed Memory 68 5  List Recall --- 26-50  List Recognition --- 3-9  Story Recall 7 16  Figure Recall 5 5      Wechsler Adult Intelligence Scale - IV Standard/Scaled Score Percentile  Working Memory Index 77 6  Digit Span 6 9  Digit Span Forward 8  25  Digit Span Backward 6 9  Digit Span Sequencing 6 9  Arithmetic 6 9  Processing Speed Index 71 3  Symbol Search 5 5  Coding 4 2      Verbal Fluency T-score Percentile  Controlled Oral Word Association (F-A-S) 47 38  Semantic Fluency (Animals) 35 7      Trail Making Test T-Score Percentile  Part A 27 1  Part B 27 1      Wisconsin Card Sorting Test - 64:  T-score Percentile      Categories -- 6 to 10      Total Errors 33 5      Perseverative Responses 39 13  Perseverative Errors 38 12  Nonperseverative Errors 30 2      Boston Diagnostic Aphasia Exam Scaled Score Percentile      Complex Ideational Material 3 ---      Rating Scales     Raw Score Descriptor  Patient Health Questionnaire - 9 3 WNL  GAD-7 0 WNL   Peter V. Nicole Kindred PsyD, [place] Clinical Neuropsychologist

## 2019-04-28 ENCOUNTER — Other Ambulatory Visit: Payer: Self-pay

## 2019-04-28 ENCOUNTER — Ambulatory Visit (INDEPENDENT_AMBULATORY_CARE_PROVIDER_SITE_OTHER): Payer: BC Managed Care – PPO | Admitting: Counselor

## 2019-04-28 DIAGNOSIS — R41844 Frontal lobe and executive function deficit: Secondary | ICD-10-CM | POA: Diagnosis not present

## 2019-04-28 DIAGNOSIS — I69319 Unspecified symptoms and signs involving cognitive functions following cerebral infarction: Secondary | ICD-10-CM

## 2019-04-28 DIAGNOSIS — F09 Unspecified mental disorder due to known physiological condition: Secondary | ICD-10-CM

## 2019-04-28 NOTE — Progress Notes (Signed)
[facility]  Patient Name: Leah Norton MRN: [id number] Date of Birth: 1955-10-09 Age: 64 y.o. 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 The patient herself presented a general sense of things being more difficult for her cognitively since the stroke but then did not provide many specific examples. Her sister says she has always been slow but that there have been definite changes since the stroke in terms of cognitive inefficiency, talking slower than in the past, and the patient is now requiring help with her finances whereas she didn't previously. She also misunderstands things more than in the past. She is still living on her own, albeit with some assistance from her family. MRI from the time of her infarct shows a subcentimeter lesion in the right paramedian pons and minimal parenchymal volume loss with no significant leukoaraiosis.   On neuropsychological assessment, there is low performance on measures of verbal reasoning, perceptual reasoning, and word reading, which have been shown to be relatively resistant to acquired cerebral impairment. Combined with marginal validity findings, these test scores suggest the possibility of learning disorder, intellectual limitation, or limited academic enrichment and temper expectations for her cognitive testing significantly. Even with that consideration I am struck by a number of very low scores in other areas, particularly on measures of executive abilities including abstract reasoning, cognitive flexibility, and working memory. Her overall cognitive performance was extremely low on the RBANS, which is telling because this is not a very challenging set of tests. She did do  relatively better in some areas, such as on measures of immediate recall. Qualitatively, she presented as having difficulties with visual scanning and with organizing herself, often taking many minutes to transfer between rooms as she collected her belongings, some of which she lost or misplaced. She also demonstrated concrete and literal thinking, showing up to the evaluation in a tracksuit and running shoes because she thought we were "going to give her a workout."   Despite her preexisting issues, I do think that [name] is demonstrating some cognitive impairment related to her stroke, especially with respect to executive abilities. Such impairments can be particularly undermining in the real world. Significant and lasting declines in cognition are not classically associated with the type of lesion she experienced. Nevertheless, the pons does have dense interconnectivity with prefrontal and other cortical areas important for cognition (e.g., Palesi et al., 2017) and some cases of significant impairment following pontine infarction have been reported in the literature (e.g., Marlowe Aschoff al., 2002).  Diagnostic Impressions: Cognitive deficit post stroke Learning Disability  References: Precious Gilding & Playford (2002). Cognitive dysfunction after isolated brain stem insult. An underdiagnosed cause of long term morbidity. Neurology, Neurosurgery, & Psychiatry. Palesi, De Rinaldis, Castellazzi, [place], Muhlert, Puckett, et al. (2017). Contralateral cortico-ponto-cerebellar pathways reconstruction in humans in vivo: implications for reciprocal cerebro-cerebellar structural connectivity in motor and non-motor areas. Nature: [employer], [phone],      Recommendations to be discussed with patient  Your performance and presentation on assessment were consistent with low scores on some measures that are expected to be relatively resistant to acquired impairment, which  tempers expectations for your cognitive test findings to some extent. Even with that consideration, I do think there have been some declines beyond that,  particularly with what is called "executive function." Executive abilities are a set of higher order skills that help [place] think in abstract ways, organize complex behaviors, shift behavior when it is no longer effective for a given purpose, and learn from feedback. These findings are consistent with your sisters report that she feels you are slower since the stroke, have a harder time understanding certain things, and are now requiring help with some more complex activities such as finances.   Neuropsychological testing takes place in a structured environment, where task demands are clear, and you have ongoing guidance and feedback from the examiner about what is expected. In the real world, tasks demands are often ambiguous, dynamic, and the right problem solving approach can be difficult to ascertain. Many problems in the real world are also dynamic, meaning what has worked in the past does not work in the future. For these reasons, individuals with executive difficulties can have an even harder time in the real world than they do on testing.  Predicting behavior in the real world is challenging on the basis of neuropsychological test data, although in your case, some level of difficulty functioning in competitive employment is expected given your test data. Your test findings are thus supportive of disability status.   I would recommend that you receive help as needed from your family with complex acitivities, such as finances, complex medical decision making, and the like.   Day to day problems with executive functioning may be manifested as: Marland Kitchen Difficulty planning a project. . Trouble estimating how much time a project may take. . Trouble communicating details in an organized sequential manner.  . Difficulty with mental strategies involved in  memorization and retrieving information from memory.  . Problems with error correction or troubleshooting or shifting problem-solving approach, including in interpersonal situations.  . Difficulty in situations where responses are not well-learned, including social and interpersonal problem-solving situations.  . Problems with affect and social judgment. . Situations requiring overcoming strong emotions, overcoming a strong habitual response, or resisting temptation.  Consider the following as potentially helpful for overcoming executive difficulties: . Organize the environment to promote success, which may include things like removing distractions, putting out everything that is needed for a task in plain site so that it doesn't have to be found, and allowing ample time to avoid working under time pressure.  The use of external aids such as alarms, sticky notes, and instructions.  Help with planning, problem solving, or other complex things by breaking things down into a series of concrete steps that can be communicated in written form.    Test Findings  Test scores are summarized in additional documentation associated with this encounter. Test scores are relative to age, gender, and educational history as available and appropriate. Ms. Ogletree performed below expectations on one embedded validity indicator but performed at the level of expectations on two other standalone and one embedded measure. Accordingly, there is some need for interpretive caution but I suspect that the low score reflects her overall level of cognitive functioning and cognitive difficulties as opposed to a performance validity problem per se.  General Intellectual Functioning/Achievement:  Performance was low on measures of overall cognitive ability with her WAIS 4 subtest short form IQ estimate (Digit Span, Coding, Block Design, and Information) coming in at an extremely low level. Her word reading was low average. Taken  together, these findings temper expectations for her cognitive test performance significantly and may suggest a history of learning disabilities, modest premorbid  intellectual ability, and/or limited academic enrichment.   Attention and Processing Efficiency: Performance on indicators of attention and working memory was unusually low at an index level. She scored in the unusually low range for digit repetition with inconsistent average to unusually low performance on two different indicators of digit repetition forward, and unusually low digit repetition backward, digit resequencing, and mental solving of arithmetical word problems.   Speed of processing was unusually low on the WAIS-IV processing speed index with unusually low performance on a measure emphasizing efficient visual matching and efficient visual scanning. Timed number symbol coding was extremely low on two different measures.   Language: Fund of information was unusually low. Visual object confrontation naming was weak and performance was a variable on measures of verbal fluency, with average generation of words in response to the letters F-A-S and unusually low animal fluency. Fluency for fruits and vegetables was unusually low.   Visuospatial Function: Performance on visuospatial and constructional measures was extremely low, although much of that may be on the basis of executive and visual scanning difficulties. She seemed to have a hard time interacting visually with judgment of angular line orientations (possibly on the basis of scanning issues). Similarly, her copy of a complex figure was unusually low, although much of that is because of poor self-regulation when completing the task and a sloppy approach, which can be conceptualized as an executive issue.   Learning and Memory: Performance on learning and memory measures suggested fairly good immediate recall of information and variable delayed recall of information. She did  remember some information and as such does not appear to be manifesting a complete memory storage problem.   In the verbal realm, her immediate recall for a 10-item word list was low average followed by average delayed recall. Delayed recognition for the word list was poor, however, which may suggest difficulties choosing amongst alternatives. Her immediate recall for a short story was somewhat better and fell at an average level followed by low average delayed recall. Sometimes, better performance with structured as opposed to unstructured information can occur when executive difficulties attenuate memory functioning.   In the visual realm, delayed recall for a modestly complex figure was unusually low. She forgot many of the figure details.    Executive Functions: Performance across the test battery suggested fairly significant difficulties given poor planning and self-regulation on figure copy, difficulties with measures emphasizing cognitive efficiency, and other executive-related skills such as working Marine scientist. On dedicated measures, she also generated many low scores, with one category on the [facility], a measure of abstract thinking and problem solving under uncertainty. Her score for total errors was unusually low and perseverative errors was low average, meaning that she had a high number of incorrect responses. She was almost unable to complete the challenging trail making test B, a measure of cognitive flexibility, and appeared to have both difficulties with visual scanning and the set switching component of that measure. She performed at an extremely low level on a measure requiring her to listen to and then reason with verbal information, including some simple inferential reasoning. She performed better when generating words in response to letters of the alphabet, which fell at an average level.   Rating Scale(s): Ms. Fosberg denied clinically significant levels of  depressive or anxious symptomatology on self-rating scales of symptoms. If anything, she seemed somewhat lackadaisical and unconcerned about her cognitive difficulties.  Viviano Simas Nicole Kindred PsyD, [place] Clinical Neuropsychologist

## 2019-04-28 NOTE — Patient Instructions (Signed)
Your performance and presentation on assessment were consistent with low scores on some measures that are expected to be relatively resistant to acquired impairment, which tempers expectations for your cognitive test findings to some extent. Even with that consideration, I do think there have been some declines beyond that, particularly with what is called "executive function." Executive abilities are a set of higher order skills that help [place] think in abstract ways, organize complex behaviors, shift behavior when it is no longer effective for a given purpose, and learn from feedback. These findings are consistent with your sisters report that she feels you are slower since the stroke, have a harder time understanding certain things, and are now requiring help with some more complex activities such as finances.   Neuropsychological testing takes place in a structured environment, where task demands are clear, and you have ongoing guidance and feedback from the examiner about what is expected. In the real world, tasks demands are often ambiguous, dynamic, and the right problem solving approach can be difficult to ascertain. Many problems in the real world are also dynamic, meaning what has worked in the past does not work in the future. For these reasons, individuals with executive difficulties can have an even harder time in the real world than they do on testing.  Predicting behavior in the real world is challenging on the basis of neuropsychological test data, although in your case, some level of difficulty functioning in competitive employment is expected given your test data. Your test findings are thus supportive of disability status.   I would recommend that you receive help as needed from your family with complex acitivities, such as finances, complex medical decision making, and the like.   Day to day problems with executive functioning may be manifested as: Marland Kitchen Difficulty planning a  project. . Trouble estimating how much time a project may take. . Trouble communicating details in an organized sequential manner.  . Difficulty with mental strategies involved in memorization and retrieving information from memory.  . Problems with error correction or troubleshooting or shifting problem-solving approach, including in interpersonal situations.  . Difficulty in situations where responses are not well-learned, including social and interpersonal problem-solving situations.  . Problems with affect and social judgment. . Situations requiring overcoming strong emotions, overcoming a strong habitual response, or resisting temptation.  Consider the following as potentially helpful for overcoming executive difficulties: . Organize the environment to promote success, which may include things like removing distractions, putting out everything that is needed for a task in plain site so that it doesn't have to be found, and allowing ample time to avoid working under time pressure.  The use of external aids such as alarms, sticky notes, and instructions.  Help with planning, problem solving, or other complex things by breaking things down into a series of concrete steps that can be communicated in written form.

## 2019-04-28 NOTE — Progress Notes (Signed)
[facility]  I met with Leah Norton to review the findings resulting from her neuropsychological evaluation. Since the last appointment, she has been about the same. I took the opportunity to follow up with her about some of the collateral information her sister Leah Norton presented, such as that she is now getting help with her finances. She stated that she is getting help with "some things" and it doesn't sound like she was getting help before. Time was spent reviewing the impressions and recommendations that are detailed in the evaluation report. Areas of emphasis included my impression of some premorbid limitations but also impairment beyond that likely due to the stroke. I explained disconnection syndromes to her, in plain language, as reflected in the patient instructions. She requested that a copy of the note be sent to her, which I will send in today's mail. Interventions provided during this encounter included psychoeducation and other topics as reflected in the patient instructions. I took time to explain the findings and answer all the patient's questions. I encouraged Leah Norton to contact me should they have any further questions or if further follow up is desired.   Current Medications and Medical History   Current Outpatient Medications  Medication Sig Dispense Refill  . atenolol (TENORMIN) 50 MG tablet Take 1 tablet (50 mg total) by mouth daily. 180 tablet 3  . buPROPion (WELLBUTRIN SR) 150 MG 12 hr tablet Take 150 mg by mouth 2 (two) times daily.     . Cholecalciferol (VITAMIN D3) 1000 units CAPS Take 2,000 Units by mouth daily.    . clopidogrel (PLAVIX) 75 MG tablet TAKE 1 TABLET(75 MG) BY MOUTH DAILY 90 tablet 0  . cyclobenzaprine (FLEXERIL) 5 MG tablet Take 1 tablet (5 mg total) by mouth at bedtime. (Patient taking differently: Take 5 mg by mouth at bedtime. "Taking as needed") 14 tablet 0  . furosemide (LASIX) 20 MG tablet TAKE 1  TABLET(20 MG) BY MOUTH DAILY AS NEEDED 30 tablet 0  . losartan (COZAAR) 100 MG tablet Take 1 tablet (100 mg total) by mouth daily. 90 tablet 3  . Multiple Vitamin (MULTIVITAMIN) tablet Take 1 tablet by mouth daily. OTC    . pantoprazole (PROTONIX) 40 MG tablet TAKE 1 TABLET BY MOUTH EVERY DAY 90 tablet 1  . rosuvastatin (CRESTOR) 10 MG tablet Take 10 mg by mouth daily.    . sitaGLIPtin-metformin (JANUMET) 50-1000 MG per tablet Take 1 tablet by mouth 2 (two) times daily with a meal.    . Zinc Sulfate (ZINC 15 PO) Take by mouth.     No current facility-administered medications for this visit.    Patient Active Problem List   Diagnosis Date Noted  . Acute ischemic stroke ([place]) 01/16/2018  . Acute CVA (cerebrovascular accident) ([place]) 01/16/2018  . GI bleed 07/10/2016  . Morbid obesity due to excess calories ([place]) 05/14/2016  . Dyspnea on exertion 04/29/2016  . Abnormal nuclear stress test   . CAD S/P percutaneous coronary angioplasty 02/08/2016  . INSOMNIA, CHRONIC 02/28/2010  . HEADACHE 02/28/2010  . HEEL PAIN, RIGHT 11/29/2009  . COUGH DUE TO ACE INHIBITORS 11/29/2009  . ATRIAL ENLARGEMENT, LEFT 03/21/2009  . ANEMIA 12/28/2008  . CARDIAC MURMUR 12/28/2008  . OTHER NONSPECIFIC ABNORMAL SERUM ENZYME LEVELS 12/28/2008  . CERVICAL MUSCLE STRAIN 12/28/2008  . CONSTIPATION 06/01/2008  . POSTMENOPAUSAL STATUS 05/15/2008  . BRONCHITIS, CHRONIC 12/18/2007  . Cigarette smoker 08/19/2007  . DEGENERATIVE DISC DISEASE, LUMBOSACRAL SPINE 08/19/2007  . LOW BACK  PAIN SYNDROME 08/19/2007  . UPPER RESPIRATORY INFECTION, VIRAL 03/05/2007  . NEOPLASM, SKIN, UNCERTAIN BEHAVIOR 16/12/9602  . GANGLION CYST, WRIST, LEFT 01/28/2007  . DENTAL PAIN 01/19/2007  . DM (diabetes mellitus), type 2, uncontrolled with complications ([place]) 54/11/8117  . Hyperlipidemia with target LDL less than 70 12/10/2006  . Essential hypertension 12/10/2006  . RHINITIS, ALLERGIC NEC 12/10/2006  . HIP PAIN, RIGHT 12/10/2006      Mental Status and Behavioral Observations  Leah Norton was available at the time of her telephone appointment. She presented with continued speech difficulties (unclear if developmental or stroke related). She was alert and oriented to all spheres and was able to identify the current president and the past two presidents before that. Her self-reported mood was "good" and her affect was congruent as assessed by vocal quality. Her thought process was logical, linear, and goal directed for the most part (but she often had to pause mid sentence to collect her thoughts). Her thought content was appropriate. There were no safety concerns at today's visit such as thoughts of harming herself or others.   Plan  Feedback provided regarding the patient's neuropsychological evaluation. Leah Norton was encouraged to contact me if any questions arise or if further follow up is desired. As reflected in the report, the findings are supportive of disability status, which I shared with Leah Norton.   Leah Simas Nicole Kindred, PsyD, ABN Clinical Neuropsychologist  Service(s) Provided at This Encounter: 30 minutes [phone]; Psychotherapy with patient/family)

## 2019-05-06 ENCOUNTER — Other Ambulatory Visit: Payer: Self-pay

## 2019-05-06 ENCOUNTER — Ambulatory Visit: Payer: BC Managed Care – PPO | Attending: Adult Health

## 2019-05-06 DIAGNOSIS — M6281 Muscle weakness (generalized): Secondary | ICD-10-CM | POA: Diagnosis not present

## 2019-05-06 DIAGNOSIS — R278 Other lack of coordination: Secondary | ICD-10-CM | POA: Diagnosis present

## 2019-05-06 DIAGNOSIS — R2681 Unsteadiness on feet: Secondary | ICD-10-CM | POA: Diagnosis present

## 2019-05-06 NOTE — Therapy (Signed)
Leah Norton, [place], [address] Phone: [phone]   Fax:  [phone]  Physical Therapy Evaluation/FCE  Patient Details  Name: Leah Norton MRN: [id number] Date of Birth: [birth year] Referring Provider (PT): Frann Rider NP    Encounter Date: 05/06/2019  PT End of Session - 05/06/19 1136    Visit Number  1    Number of Visits  1    PT Start Time  [id number]    PT Stop Time  1500    PT Time Calculation (min)  203 min    Activity Tolerance  Patient limited by fatigue;Patient limited by pain    Behavior During Therapy  [facility] for tasks assessed/performed       Past Medical History:  Diagnosis Date  . Anginal pain ([place])   . Arthritis    "right anikle" (02/29/2016)  . CAD in native artery 02/08/2016   Three-vessel coronary artery calcification on CT scan  . Complication of anesthesia    "had reaction w/hives, rash, itching" (02/29/2016)  . Constipation   . Coronary artery disease    12/17 PCI with DESx1 to pLAD EF normal  . Enlarged heart   . GERD (gastroesophageal reflux disease)   . High cholesterol   . Hypertension   . Migraine    hx  . Stroke ([place])   . Symptomatic anemia 07/10/2016  . Type II diabetes mellitus ([place])     Past Surgical History:  Procedure Laterality Date  . ABDOMINAL HYSTERECTOMY    . CARDIAC CATHETERIZATION N/A 02/29/2016   Procedure: Left Heart Cath and Coronary Angiography;  Surgeon: Belva Crome, MD;  Location: [place] [facility];  Service: Cardiovascular;  Laterality: N/A;  . CARDIAC CATHETERIZATION N/A 02/29/2016   Procedure: Coronary Stent Intervention;  Surgeon: Belva Crome, MD;  Location: [place] [facility];  Service: Cardiovascular;  Laterality: N/A;  . CARDIAC CATHETERIZATION  2005   "no blockages"  . COLONOSCOPY WITH PROPOFOL N/A 03/15/2014   Procedure: COLONOSCOPY WITH PROPOFOL;  Surgeon: Garlan Fair, MD;  [facility]: [facility];  Service: Endoscopy;   Laterality: N/A;  . CORONARY ANGIOPLASTY WITH STENT PLACEMENT    . ESOPHAGOGASTRODUODENOSCOPY N/A 07/11/2016   Procedure: ESOPHAGOGASTRODUODENOSCOPY (EGD);  Surgeon: Laurence Spates, MD;  Location: [facility];  Service: Endoscopy;  Laterality: N/A;  . FRACTURE SURGERY    . MULTIPLE TOOTH EXTRACTIONS     teeth removed  . ORIF ANKLE FRACTURE Right 11/12/2012   Procedure: OPEN REDUCTION INTERNAL FIXATION (ORIF) ANKLE FRACTURE;  Surgeon: Marianna Payment, MD;  Location: [place];  Service: Orthopedics;  Laterality: Right;  Open reduction internal fixation ankle fracture, trimalleolar    There were no vitals filed for this visit.   Subjective Assessment - 05/06/19 1516    Subjective  She was not sure why she was here to start then after talking understood this was for work.    Pertinent History  PMH includes essential HTN, hyperlipidemia, DM, DDD lumbo-sacral spine; Hx of CVA 01/15/18    Currently in Pain?  No/denies         [facility] PT Assessment - 05/06/19 0001      Assessment   Medical Diagnosis  CVA    Referring Provider (PT)  Frann Rider NP     Onset Date/Surgical Date  01/15/18    Next MD Visit  Post testing    Prior Therapy  OPPT      Precautions   Precautions  None  Restrictions   Weight Bearing Restrictions  No      Balance Screen   Has the patient fallen in the past 6 months  No      Prior Function   Level of Independence  Independent    Vocation  Full time employment    Vocation Requirements  Walking, talking with walkie-talkie, patrol and secure areas                Objective measurements completed on examination: See above findings.              PT Education - 05/06/19 1517    Education Details  reviewed FCE results and what the report indicated  and i was not sure how this would impact return to work.  She may benefit from some strength and conditioning  to improve endureance with walking  for work    [employer]) Educated  Patient    Methods   Explanation    Comprehension  Verbalized understanding                  Plan - 05/06/19 1140    Clinical Impression Statement  Leah Norton was rated at  light  level of work for 8 hour day. See report for details. Conditioning program may help with endurance but she appears reluctant to push herself. BP also is elevated and may impact her tolerance to activity. She also did not eat before coming to session   Personal Factors and Comorbidities  Time since onset of injury/illness/exacerbation    PT Next Visit Plan  Send FCE report to NP    Consulted and Agree with Plan of Care  Patient       Patient will benefit from skilled therapeutic intervention in order to improve the following deficits and impairments:     Visit Diagnosis: Muscle weakness (generalized)  Other lack of coordination  Unsteadiness on feet     Problem List Patient Active Problem List   Diagnosis Date Noted  . Acute ischemic stroke ([place]) 01/16/2018  . Acute CVA (cerebrovascular accident) ([place]) 01/16/2018  . GI bleed 07/10/2016  . Morbid obesity due to excess calories ([place]) 05/14/2016  . Dyspnea on exertion 04/29/2016  . Abnormal nuclear stress test   . CAD S/P percutaneous coronary angioplasty 02/08/2016  . INSOMNIA, CHRONIC 02/28/2010  . HEADACHE 02/28/2010  . HEEL PAIN, RIGHT 11/29/2009  . COUGH DUE TO ACE INHIBITORS 11/29/2009  . ATRIAL ENLARGEMENT, LEFT 03/21/2009  . ANEMIA 12/28/2008  . CARDIAC MURMUR 12/28/2008  . OTHER NONSPECIFIC ABNORMAL SERUM ENZYME LEVELS 12/28/2008  . CERVICAL MUSCLE STRAIN 12/28/2008  . CONSTIPATION 06/01/2008  . POSTMENOPAUSAL STATUS 05/15/2008  . BRONCHITIS, CHRONIC 12/18/2007  . Cigarette smoker 08/19/2007  . DEGENERATIVE DISC DISEASE, LUMBOSACRAL SPINE 08/19/2007  . LOW BACK PAIN SYNDROME 08/19/2007  . UPPER RESPIRATORY INFECTION, VIRAL 03/05/2007  . NEOPLASM, SKIN, UNCERTAIN BEHAVIOR [id number]  . GANGLION CYST, WRIST, LEFT 01/28/2007  . DENTAL PAIN  01/19/2007  . DM (diabetes mellitus), type 2, uncontrolled with complications ([place]) 123XX123  . Hyperlipidemia with target LDL less than 70 12/10/2006  . Essential hypertension 12/10/2006  . RHINITIS, ALLERGIC NEC 12/10/2006  . HIP PAIN, RIGHT 12/10/2006    Darrel Hoover   PT 05/06/2019, 3:26 PM  [facility] [address] [place], [place], [address] Phone: [phone]   Fax:  [phone]  Name: Leah Norton MRN: [id number] Date of Birth: April 10, 1955

## 2019-05-07 NOTE — Telephone Encounter (Signed)
Have not received patient's job description. Have not received results of Functional Capacity Evaluation. NP out of office this week. [place] disability forms on hold.

## 2019-05-13 ENCOUNTER — Ambulatory Visit: Payer: BC Managed Care – PPO | Admitting: Adult Health

## 2019-05-14 ENCOUNTER — Telehealth: Payer: Self-pay | Admitting: *Deleted

## 2019-05-14 NOTE — Telephone Encounter (Signed)
Form to MR. 

## 2019-05-14 NOTE — Telephone Encounter (Signed)
Completed and placed in out box.  Thank you.

## 2019-05-14 NOTE — Telephone Encounter (Signed)
[place] retirement form medical report for dissabilty eligibility review form 7A signed 05-12-19 by JM/NP and to MR.

## 2019-05-14 NOTE — Telephone Encounter (Signed)
[place] retirement STD benefits completed to JM/NP for signature. (February).

## 2019-05-16 ENCOUNTER — Other Ambulatory Visit: Payer: Self-pay | Admitting: Nurse Practitioner

## 2019-05-18 ENCOUNTER — Telehealth: Payer: Self-pay | Admitting: *Deleted

## 2019-05-18 NOTE — Telephone Encounter (Signed)
Pt form faxed to [phone] to [employer].

## 2019-06-11 ENCOUNTER — Ambulatory Visit: Payer: Self-pay | Attending: Family

## 2019-06-11 ENCOUNTER — Telehealth: Payer: Self-pay | Admitting: Adult Health

## 2019-06-11 DIAGNOSIS — Z23 Encounter for immunization: Secondary | ICD-10-CM

## 2019-06-11 NOTE — Progress Notes (Signed)
   Covid-19 Vaccination Clinic  Name:  Leah Norton    MRN: [id number] DOB: 01-Jun-1955  06/11/2019  [name] was observed post Covid-19 immunization for 30 minutes based on pre-vaccination screening without incident. She was provided with Vaccine Information Sheet and instruction to access the V-Safe system.   [name] was instructed to call 911 with any severe reactions post vaccine: Marland Kitchen Difficulty breathing  . Swelling of face and throat  . A fast heartbeat  . A bad rash all over body  . Dizziness and weakness   Immunizations Administered    Name Date Dose VIS Date Route   Moderna COVID-19 Vaccine 06/11/2019 10:25 AM 0.5 mL 02/10/2019 Intramuscular   Manufacturer: Moderna   Lot: [id number]   [place]:[id number]

## 2019-06-11 NOTE — Telephone Encounter (Signed)
I spoke to Lissa, with [employer] [place] [employer].  Asking questions regarding pts FITNESS for duty accomodation form.  She will email me this form and questions she is asking related to this.  Relayed hours here and not here on Friday. Trying to make determination for pt regarding her job.

## 2019-06-11 NOTE — Telephone Encounter (Signed)
LMVM for Leah Norton At [employer] HR relating to pt htat was returning call.

## 2019-06-11 NOTE — Telephone Encounter (Signed)
HR rep called to verify the accomodation form they received for the patient to return to work. States the pt was previously working 12hr shifts at night and accommodations are for 8hr shifts during the day and she would like to clarify the medical necessity for that as the dept doesn't have the availability for the day shift.

## 2019-06-11 NOTE — Telephone Encounter (Signed)
lmvm returning call.

## 2019-06-11 NOTE — Telephone Encounter (Signed)
Lissa has called Sandy,RN back.  Please call her at [phone]

## 2019-06-18 NOTE — Telephone Encounter (Signed)
Provided letter for her HR department in regards to fit for duty based on functional capacity evaluation and neurocognitive evaluation.  Please see letter for additional information.

## 2019-06-18 NOTE — Telephone Encounter (Signed)
Fax confirmation letter to [employer] re: return to duty letter.  [phone], ov [phone].

## 2019-06-18 NOTE — Progress Notes (Signed)
Cardiology Office Note:    Date:  06/19/2019   ID:  Leah Norton, DOB Mar 26, 1955, MRN [id number]  PCP:  Seward Carol, MD  Cardiologist:  Sinclair Grooms, MD   Referring MD: Seward Carol, MD   Chief Complaint  Patient presents with  . Coronary Artery Disease  . Medication Problem    Encourage Jardiance  . Chest Pain    History of Present Illness:    Leah Norton is a 64 y.o. female with a hx of diabetes mellitus II, hyperlipidemia, hypertension, tobacco abuse, nonobstructive coronary disease by angiography in 2005, 75-85% proximal LAD which was stented with DES. Brilinta intolerant.  Has occasional left chest discomfort in a localized parasternal area that lasts 30 to 60 seconds.  Occurs spontaneously.  Not exertion related.  Has been occurring since January.  Has never tried nitroglycerin for it.  Feels dissimilar to prior episodes of chest pain.  She wonders if she has PAD.  She describes tingling in her legs mostly when she is sitting or lying.  She is not sleeping well.  Awakens frequently.  Past Medical History:  Diagnosis Date  . Anginal pain ([place])   . Arthritis    "right anikle" (02/29/2016)  . CAD in native artery 02/08/2016   Three-vessel coronary artery calcification on CT scan  . Complication of anesthesia    "had reaction w/hives, rash, itching" (02/29/2016)  . Constipation   . Coronary artery disease    12/17 PCI with DESx1 to pLAD EF normal  . Enlarged heart   . GERD (gastroesophageal reflux disease)   . High cholesterol   . Hypertension   . Migraine    hx  . Stroke ([place])   . Symptomatic anemia 07/10/2016  . Type II diabetes mellitus ([place])     Past Surgical History:  Procedure Laterality Date  . ABDOMINAL HYSTERECTOMY    . CARDIAC CATHETERIZATION N/A 02/29/2016   Procedure: Left Heart Cath and Coronary Angiography;  Surgeon: Belva Crome, MD;  Location: [place] [facility];  Service: Cardiovascular;  Laterality: N/A;  . CARDIAC  CATHETERIZATION N/A 02/29/2016   Procedure: Coronary Stent Intervention;  Surgeon: Belva Crome, MD;  Location: [place] [facility];  Service: Cardiovascular;  Laterality: N/A;  . CARDIAC CATHETERIZATION  2005   "no blockages"  . COLONOSCOPY WITH PROPOFOL N/A 03/15/2014   Procedure: COLONOSCOPY WITH PROPOFOL;  Surgeon: Garlan Fair, MD;  [facility];  Service: Endoscopy;  Laterality: N/A;  . CORONARY ANGIOPLASTY WITH STENT PLACEMENT    . ESOPHAGOGASTRODUODENOSCOPY N/A 07/11/2016   Procedure: ESOPHAGOGASTRODUODENOSCOPY (EGD);  Surgeon: Laurence Spates, MD;  Location: [facility];  Service: Endoscopy;  Laterality: N/A;  . FRACTURE SURGERY    . MULTIPLE TOOTH EXTRACTIONS     teeth removed  . ORIF ANKLE FRACTURE Right 11/12/2012   Procedure: OPEN REDUCTION INTERNAL FIXATION (ORIF) ANKLE FRACTURE;  Surgeon: Marianna Payment, MD;  Location: [place];  Service: Orthopedics;  Laterality: Right;  Open reduction internal fixation ankle fracture, trimalleolar    Current Medications: Current Meds  Medication Sig  . buPROPion (WELLBUTRIN SR) 150 MG 12 hr tablet Take 150 mg by mouth 2 (two) times daily.   . chlorthalidone (HYGROTON) 25 MG tablet Take 25 mg by mouth every morning.  . Cholecalciferol (VITAMIN D3) 1000 units CAPS Take 2,000 Units by mouth daily.  . clopidogrel (PLAVIX) 75 MG tablet TAKE 1 TABLET(75 MG) BY MOUTH DAILY  . cyclobenzaprine (FLEXERIL) 5 MG tablet Take 1 tablet (  5 mg total) by mouth at bedtime.  . furosemide (LASIX) 20 MG tablet TAKE 1 TABLET(20 MG) BY MOUTH DAILY AS NEEDED  . losartan (COZAAR) 100 MG tablet Take 1 tablet (100 mg total) by mouth daily.  . Multiple Vitamin (MULTIVITAMIN) tablet Take 1 tablet by mouth daily. OTC  . pantoprazole (PROTONIX) 40 MG tablet TAKE 1 TABLET BY MOUTH EVERY DAY  . rosuvastatin (CRESTOR) 10 MG tablet Take 10 mg by mouth daily.  . sitaGLIPtin-metformin (JANUMET) 50-1000 MG per tablet Take 1 tablet by mouth 2 (two) times daily with a  meal.  . Zinc Sulfate (ZINC 15 PO) Take by mouth.     Allergies:   Codeine, Erythromycin, Penicillins, Sulfa antibiotics, Sulfonamide derivatives, and Vicodin [hydrocodone-acetaminophen]   Social History   Socioeconomic History  . Marital status: Single    Spouse name: Not on file  . Number of children: Not on file  . Years of education: Not on file  . Highest education level: Not on file  Occupational History  . Not on file  Tobacco Use  . Smoking status: Former Smoker    Packs/day: 0.50    Years: 42.00    Pack years: 21.00    Types: Cigarettes    Quit date: 01/15/2018    Years since quitting: 1.4  . Smokeless tobacco: Never Used  Substance and Sexual Activity  . Alcohol use: Yes    Comment: occasionally  . Drug use: No  . Sexual activity: Not on file  Other Topics Concern  . Not on file  Social History Narrative  . Not on file   Social Determinants of Health   Financial Resource Strain:   . Difficulty of Paying Living Expenses:   Food Insecurity:   . Worried About Charity fundraiser in the Last Year:   . Arboriculturist in the Last Year:   Transportation Needs:   . Film/video editor (Medical):   Marland Kitchen Lack of Transportation (Non-Medical):   Physical Activity:   . Days of Exercise per Week:   . Minutes of Exercise per Session:   Stress:   . Feeling of Stress :   Social Connections:   . Frequency of Communication with Friends and Family:   . Frequency of Social Gatherings with Friends and Family:   . Attends Religious Services:   . Active Member of Clubs or Organizations:   . Attends Archivist Meetings:   Marland Kitchen Marital Status:      Family History: The patient's family history includes Hypertension in her mother.  ROS:   Please see the history of present illness.    Had CVA in 2019.  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  She has been  prescribed Jardiance by Dr. Delfina Redwood but has not started the medication.  She is concerned about side effects and the cost.  All other systems reviewed and are negative.  EKGs/Labs/Other Studies Reviewed:    The following studies were reviewed today: No new data  EKG:  EKG normal sinus rhythm, biatrial abnormality, and when compared to the tracing from November 2019, no changes noted.  Recent Labs: No results found for requested labs within last 8760 hours.  Recent Lipid Panel    Component Value Date/Time   CHOL 153 01/16/2018 0318   TRIG 169 (H) 01/16/2018 0318   HDL 42 01/16/2018 0318  CHOLHDL 3.6 01/16/2018 0318   VLDL 34 01/16/2018 0318   LDLCALC 77 01/16/2018 0318    Physical Exam:    VS:  BP 132/72   Pulse 91   Ht 5\' 7"  (1.702 m)   Wt 183 lb (83 kg)   SpO2 96%   BMI 28.66 kg/m     Wt Readings from Last 3 Encounters:  06/19/19 183 lb (83 kg)  04/09/19 186 lb (84.4 kg)  11/19/18 183 lb 9.6 oz (83.3 kg)     GEN: Obese. No acute distress HEENT: Normal NECK: No JVD. LYMPHATICS: No lymphadenopathy CARDIAC:  RRR without murmur, gallop, or edema. VASCULAR:  Normal Pulses. No bruits. RESPIRATORY:  Clear to auscultation without rales, wheezing or rhonchi  ABDOMEN: Soft, non-tender, non-distended, No pulsatile mass, MUSCULOSKELETAL: No deformity  SKIN: Warm and dry NEUROLOGIC:  Alert and oriented x 3 PSYCHIATRIC:  Normal affect   ASSESSMENT:    1. CAD S/P percutaneous coronary angioplasty   2. Chronic combined systolic and diastolic heart failure ([place])   3. Hyperlipidemia with target LDL less than 70   4. Essential hypertension   5. Morbid obesity due to excess calories ([place])   6. DM (diabetes mellitus), type 2, uncontrolled with complications ([place])   7. Educated about COVID-19 virus infection    PLAN:    In order of problems listed above:  1. Stable without angina.  Current chest discomfort is brief, localized, and sounds more musculoskeletal perhaps from  thoracic spine disease with referred pain.  Secondary prevention discussed in detail. 2. No symptoms of dyspnea or evidence of volume overload on exam.  Dr. Delfina Redwood has prescribed Jardiance (empagliflozin) which she has not started because of concerns as noted above.  I strongly encouraged that she start this medication. 3. LDL target is 70 or less.  Current total cholesterol is 149 HDL 40 and LDL is less than 70.  Continue rosuvastatin 10 mg/day. 4. Adequate blood pressure control.  Continue chlorthalidone 25 mg/day, Cozaar 100 mg/day, and Tenormin 50 mg/day. 5. Encourage aerobic activity with an attempt to achieve greater than 150 minutes of moderate activity per week. 6. We discussed the A1c of 8.2.  I strongly encouraged initiating empagliflozin as recommended by Dr. Delfina Redwood. 7. She has received the COVID-19 vaccine.  I encouraged continued social distancing.  Overall education and awareness concerning primary/secondary risk prevention was discussed in detail: LDL less than 70, hemoglobin A1c less than 7, blood pressure target less than 130/80 mmHg, >150 minutes of moderate aerobic activity per week, avoidance of smoking, weight control (via diet and exercise), and continued surveillance/management of/for obstructive sleep apnea.    Medication Adjustments/Labs and Tests Ordered: Current medicines are reviewed at length with the patient today.  Concerns regarding medicines are outlined above.  Orders Placed This Encounter  Procedures  . EKG 12-Lead   No orders of the defined types were placed in this encounter.   Patient Instructions  Medication Instructions:  Your physician recommends that you continue on your current medications as directed. Please refer to the Current Medication list given to you today.  *If you need a refill on your cardiac medications before your next appointment, please call your pharmacy*   Lab Work: None If you have labs (blood work) drawn today and your tests  are completely normal, you will receive your results only by: Marland Kitchen MyChart Message (if you have MyChart) OR . A paper copy in the mail If you have any lab test that is abnormal or we  need to change your treatment, we will call you to review the results.   Testing/Procedures: None   Follow-Up: At [facility], you and your health needs are our priority.  As part of our continuing mission to provide you with exceptional heart care, we have created designated Provider Care Teams.  These Care Teams include your primary Cardiologist (physician) and Advanced Practice Providers (APPs -  Physician Assistants and Nurse Practitioners) who all work together to provide you with the care you need, when you need it.  We recommend signing up for the patient portal called "MyChart".  Sign up information is provided on this After Visit Summary.  MyChart is used to connect with patients for Virtual Visits (Telemedicine).  Patients are able to view lab/test results, encounter notes, upcoming appointments, etc.  Non-urgent messages can be sent to your provider as well.   To learn more about what you can do with MyChart, go to [email or web address].    Your next appointment:   12 month(s)  The format for your next appointment:   In Person  Provider:   You may see Sinclair Grooms, MD or one of the following Advanced Practice Providers on your designated Care Team:    Truitt Merle, NP  Cecilie Kicks, NP  Kathyrn Drown, NP    Other Instructions  Your provider recommends that you maintain 150 minutes per week of moderate aerobic activity.      Signed, Sinclair Grooms, MD  06/19/2019 10:32 AM    [place]

## 2019-06-19 ENCOUNTER — Encounter: Payer: Self-pay | Admitting: Interventional Cardiology

## 2019-06-19 ENCOUNTER — Other Ambulatory Visit: Payer: Self-pay

## 2019-06-19 ENCOUNTER — Ambulatory Visit: Payer: 59 | Admitting: Interventional Cardiology

## 2019-06-19 VITALS — BP 132/72 | HR 91 | Ht 67.0 in | Wt 183.0 lb

## 2019-06-19 DIAGNOSIS — I2510 Atherosclerotic heart disease of native coronary artery without angina pectoris: Secondary | ICD-10-CM | POA: Diagnosis not present

## 2019-06-19 DIAGNOSIS — E118 Type 2 diabetes mellitus with unspecified complications: Secondary | ICD-10-CM

## 2019-06-19 DIAGNOSIS — E785 Hyperlipidemia, unspecified: Secondary | ICD-10-CM

## 2019-06-19 DIAGNOSIS — Z7189 Other specified counseling: Secondary | ICD-10-CM

## 2019-06-19 DIAGNOSIS — I10 Essential (primary) hypertension: Secondary | ICD-10-CM

## 2019-06-19 DIAGNOSIS — I5042 Chronic combined systolic (congestive) and diastolic (congestive) heart failure: Secondary | ICD-10-CM | POA: Diagnosis not present

## 2019-06-19 DIAGNOSIS — Z9861 Coronary angioplasty status: Secondary | ICD-10-CM

## 2019-06-19 DIAGNOSIS — E1165 Type 2 diabetes mellitus with hyperglycemia: Secondary | ICD-10-CM

## 2019-06-19 DIAGNOSIS — IMO0002 Reserved for concepts with insufficient information to code with codable children: Secondary | ICD-10-CM

## 2019-06-19 NOTE — Patient Instructions (Signed)
Medication Instructions:  Your physician recommends that you continue on your current medications as directed. Please refer to the Current Medication list given to you today.  *If you need a refill on your cardiac medications before your next appointment, please call your pharmacy*   Lab Work: None If you have labs (blood work) drawn today and your tests are completely normal, you will receive your results only by: . MyChart Message (if you have MyChart) OR . A paper copy in the mail If you have any lab test that is abnormal or we need to change your treatment, we will call you to review the results.   Testing/Procedures: None   Follow-Up: At [facility], you and your health needs are our priority.  As part of our continuing mission to provide you with exceptional heart care, we have created designated Provider Care Teams.  These Care Teams include your primary Cardiologist (physician) and Advanced Practice Providers (APPs -  Physician Assistants and Nurse Practitioners) who all work together to provide you with the care you need, when you need it.  We recommend signing up for the patient portal called "MyChart".  Sign up information is provided on this After Visit Summary.  MyChart is used to connect with patients for Virtual Visits (Telemedicine).  Patients are able to view lab/test results, encounter notes, upcoming appointments, etc.  Non-urgent messages can be sent to your provider as well.   To learn more about what you can do with MyChart, go to [email or web address].    Your next appointment:   12 month(s)  The format for your next appointment:   In Person  Provider:   You may see Henry W Smith III, MD or one of the following Advanced Practice Providers on your designated Care Team:    Lori Gerhardt, NP  Laura Ingold, NP  Jill McDaniel, NP    Other Instructions  Your provider recommends that you maintain 150 minutes per week of moderate aerobic activity.   

## 2019-07-09 ENCOUNTER — Ambulatory Visit: Payer: 59 | Admitting: Adult Health

## 2019-07-09 ENCOUNTER — Encounter: Payer: Self-pay | Admitting: Adult Health

## 2019-07-09 ENCOUNTER — Other Ambulatory Visit: Payer: Self-pay

## 2019-07-09 VITALS — BP 130/75 | HR 87 | Temp 96.9°F | Ht 66.0 in | Wt 182.0 lb

## 2019-07-09 DIAGNOSIS — I69398 Other sequelae of cerebral infarction: Secondary | ICD-10-CM | POA: Diagnosis not present

## 2019-07-09 DIAGNOSIS — Z7409 Other reduced mobility: Secondary | ICD-10-CM

## 2019-07-09 DIAGNOSIS — I6350 Cerebral infarction due to unspecified occlusion or stenosis of unspecified cerebral artery: Secondary | ICD-10-CM | POA: Diagnosis not present

## 2019-07-09 DIAGNOSIS — R41844 Frontal lobe and executive function deficit: Secondary | ICD-10-CM

## 2019-07-09 DIAGNOSIS — R2689 Other abnormalities of gait and mobility: Secondary | ICD-10-CM

## 2019-07-09 NOTE — Progress Notes (Signed)
[facility] [address] [place]. [place] [address] [phone]       OFFICE FOLLOW UP NOTE  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 Leonie Man  CHIEF COMPLAINT:  Chief Complaint  Patient presents with  . Hx of Stroke    rm 9, 3 month FU "doing well, no concerns"    HPI:   Today, 07/09/2019, Leah Norton returns for stroke follow-up.  Residual stroke deficits of cognitive impairment, occasional dysarthria, imbalance and decreased endurance.  Since prior visit, underwent neurocognitive evaluation with impairment executive functioning with findings supportive of disability status.  Also underwent FCE 05/06/2019 rated at light level work for 8-hour day.  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 She has applied for permanent disability. She denies worsening stroke symptoms or new stroke/TIA symptoms.  She has been slowly increasing exercise with daily walks but does continue to feel limited due to decreased endurance and occasional imbalance.  Continues on Plavix and Crestor for secondary stroke prevention.  Blood pressure today 130/75. Glucose levels variable with recent A1c 8.2. Continues to follow with Dr. Delfina Redwood for DM, HTN and HLD management. No further concerns at this time.     History provided for reference purposes only Update 04/09/2019: Leah Norton is being seen today for stroke follow up and request for ongoing short-term disability versus possible return to work.  After prior visit, attempted to have patient return to work with dayshift hours for max of 8-hour shifts but apparently she  was not able to return to work due to her employer denying recommended restrictions.  Katharine Look, RN spoke to patient and she was planning on looking for a different type of job she has not found one at this time. She continues to complain of intermittent imbalance that can worsen at certain times a day, difficulty with walking long distances due to shortness of breath, dysarthria wax/wane and delayed recall/short-term memory concerns.  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  Shortness of breath on exertion has been slowly worsening but she does endorse sedentary lifestyle with no routine exercise and limited activity throughout the day.  Continues on Plavix and Crestor for secondary stroke prevention without side effects.  Blood pressure today elevated at 161/91, asymptomatic, but does monitor at home and typically stable.  Denies new or worsening stroke/TIA symptoms.   Update 11/19/2018: Leah Norton is being seen today for stroke follow-up accompanied by her sister.  Residual deficits mild dysarthria, short term memory deficits, and occasional balance difficulties.  She continues to live independently maintaining ADLs and IADLs without difficulty.  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  It was recommended to return to work with no more than 8-hour shifts during the day but unfortunately her current employer was unable to satisfy these recommendations.  Our office has been assisting with disability.  She is asking for another work letter with my  recommendations as they were unable to satisfy before due to beginning of pandemic.  Continues on Plavix and Crestor for secondary stroke prevention without side effects.  Blood pressure  stable at 136/74.  She does have concerns regarding right ankle swelling which is chronic and currently being followed by PCP regarding this concern.  Denies new or worsening stroke/TIA symptoms.   Update 05/28/2018: Leah Norton is a 64 year old female who is being seen today for follow-up visit regarding right pontine infarct in 01/2018 and is accompanied by her sister.  She continues to have difficulties intermittently with dysarthria but has had improvement of gait and balance difficulties.  She unfortunately was unable to continue to participate in therapy sessions due to financial reasons.  She does endorse continuation of exercises at home.  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  She currently works 12-hour night shifts.  She continues on Plavix without side effects of bleeding or bruising.  She continues on rosuvastatin without side effects myalgias.  Blood pressure today 152/78.  Patient does endorse mild headache over the past couple days located left occipital region but denies any other neurological symptoms.  Denies new or worsening stroke/TIA symptoms.  Initial visit 02/26/18: Patient is being seen today for hospital follow-up and is accompanied by her sister.  Patient continues to have left hemiparesis but has been improving along with intermittent balance difficulties and is currently participating in PT/OT.  She also continues to have dysarthria with occasional aphasia and cognitive slowing but continues to participate in speech therapy.  She has since returned home where she is able to complete all her ADLs and some assistance with IADLs from her sister.  She has continued on aspirin and Plavix without side effects of bleeding or bruising.  Continues on Crestor without side effects of myalgias.  Blood pressure today 159/88 and patient does monitor at home with typical SBP 120-130.  She has completely quit smoking  since hospital discharge.  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  She is currently receiving FMLA which was provided by PCP.  She does endorse snoring with insomnia and daytime fatigue which has been present prior to her stroke but does endorse worsening fatigue since hospital discharge.  She denies prior sleep study.  She is also been having left side ear pains which was also present prior to her stroke and has been seen by ENT and per patient, she was told everything was satisfactory.  She does endorse neck pain on left side with some radiating pain up to her ear and a discomfort surrounding her ear up into her head occasionally causing headaches.  No further concerns at this time.  Denies new or worsening stroke/TIA symptoms.  Stroke admission 01/15/2018: Leah Norton is a 64 y.o. female with history of CAD s/p DES to pLAD in 12/17, hypercholesterolemia, HTN, DM2 and smoking   who presented with slurred speech, balance problems, AMS, and left sided weakness. She did not receive IV t-PA due to late presentation.   CT head reviewed and negative for acute infarct.  MRI brain reviewed and showed subcentimeter acute early subacute infarct within the right pons.  CTA head and neck showed a 50 to 75% stenosis proximal right M1 MCA which was noncontributory with respect to the acute ischemia.  2D echo showed  an EF of 40 to 45%.  LDL 77 and A1c 6.6.  Patient was on aspirin 81 mg PTA and recommended DAPT for 3 weeks on Plavix alone.  Etiology of infarct likely due to small vessel disease source.  Recommended to continue to follow with PCP in regards to HTN, HLD and DM management along with smoking cessation counseling provided.  Patient discharged home in stable condition with recommendations of outpatient PT.     ROS:   14 system review of systems performed and negative with exception of memory loss, imbalance, and decreased endurance   PMH:   Past Medical History:  Diagnosis Date  . Anginal pain ([place])   . Arthritis    "right anikle" (02/29/2016)  . CAD in native artery 02/08/2016   Three-vessel coronary artery calcification on CT scan  . Complication of anesthesia    "had reaction w/hives, rash, itching" (02/29/2016)  . Constipation   . Coronary artery disease    12/17 PCI with DESx1 to pLAD EF normal  . Enlarged heart   . GERD (gastroesophageal reflux disease)   . High cholesterol   . Hypertension   . Migraine    hx  . Stroke ([place])   . Symptomatic anemia 07/10/2016  . Type II diabetes mellitus ([facility])     PSH:  Past Surgical History:  Procedure Laterality Date  . ABDOMINAL HYSTERECTOMY    . CARDIAC CATHETERIZATION N/A 02/29/2016   Procedure: Left Heart Cath and Coronary Angiography;  Surgeon: Belva Crome, MD;  Location: [facility];  Service: Cardiovascular;  Laterality: N/A;  . CARDIAC CATHETERIZATION N/A 02/29/2016   Procedure: Coronary Stent Intervention;  Surgeon: Belva Crome, MD;  Location: [facility];  Service: Cardiovascular;  Laterality: N/A;  . CARDIAC CATHETERIZATION  2005   "no blockages"  . COLONOSCOPY WITH PROPOFOL N/A 03/15/2014   Procedure: COLONOSCOPY WITH PROPOFOL;  Surgeon: Garlan Fair, MD;  Location: [facility];  Service: Endoscopy;  Laterality: N/A;  . CORONARY ANGIOPLASTY WITH STENT PLACEMENT    . ESOPHAGOGASTRODUODENOSCOPY N/A 07/11/2016   Procedure: ESOPHAGOGASTRODUODENOSCOPY (EGD);  Surgeon: Laurence Spates, MD;  Location: [facility];  Service: Endoscopy;  Laterality: N/A;  . FRACTURE SURGERY    . MULTIPLE TOOTH EXTRACTIONS     teeth removed  . ORIF ANKLE FRACTURE Right 11/12/2012   Procedure: OPEN REDUCTION INTERNAL FIXATION (ORIF) ANKLE FRACTURE;  Surgeon: Marianna Payment, MD;  Location: [facility];  Service: Orthopedics;  Laterality: Right;  Open reduction internal fixation ankle fracture, trimalleolar    Social History:  Social History   Socioeconomic History    . Marital status: Single    Spouse name: Not on file  . Number of children: Not on file  . Years of education: Not on file  . Highest education level: Not on file  Occupational History  . Not on file  Tobacco Use  . Smoking status: Former Smoker    Packs/day: 0.50    Years: 42.00    Pack years: 21.00    Types: Cigarettes    Quit date: 01/15/2018    Years since quitting: 1.4  . Smokeless tobacco: Never Used  Substance and Sexual Activity  . Alcohol use: Yes    Comment: occasionally  . Drug use: No  . Sexual activity: Not on file  Other Topics Concern  . Not on file  Social History Narrative  . Not on file   Social Determinants of Health   Financial Resource Strain:   . Difficulty  of Paying Living Expenses:   Food Insecurity:   . Worried About Charity fundraiser in the Last Year:   . Arboriculturist in the Last Year:   Transportation Needs:   . Film/video editor (Medical):   Marland Kitchen Lack of Transportation (Non-Medical):   Physical Activity:   . Days of Exercise per Week:   . Minutes of Exercise per Session:   Stress:   . Feeling of Stress :   Social Connections:   . Frequency of Communication with Friends and Family:   . Frequency of Social Gatherings with Friends and Family:   . Attends Religious Services:   . Active Member of Clubs or Organizations:   . Attends Archivist Meetings:   Marland Kitchen Marital Status:   Intimate Partner Violence:   . Fear of Current or Ex-Partner:   . Emotionally Abused:   Marland Kitchen Physically Abused:   . Sexually Abused:     Family History:  Family History  Problem Relation Age of Onset  . Hypertension Mother     Medications:   Current Outpatient Medications on File Prior to Visit  Medication Sig Dispense Refill  . atenolol (TENORMIN) 50 MG tablet Take 1 tablet (50 mg total) by mouth daily. 180 tablet 3  . buPROPion (WELLBUTRIN SR) 150 MG 12 hr tablet Take 150 mg by mouth 2 (two) times daily.     . chlorthalidone (HYGROTON) 25 MG  tablet Take 25 mg by mouth every morning.    . Cholecalciferol (VITAMIN D3) 1000 units CAPS Take 2,000 Units by mouth daily.    . clopidogrel (PLAVIX) 75 MG tablet TAKE 1 TABLET(75 MG) BY MOUTH DAILY 90 tablet 0  . cyclobenzaprine (FLEXERIL) 5 MG tablet Take 1 tablet (5 mg total) by mouth at bedtime. 14 tablet 0  . furosemide (LASIX) 20 MG tablet TAKE 1 TABLET(20 MG) BY MOUTH DAILY AS NEEDED 30 tablet 0  . JARDIANCE 10 MG TABS tablet Take 10 mg by mouth daily.    Marland Kitchen losartan (COZAAR) 100 MG tablet Take 1 tablet (100 mg total) by mouth daily. 90 tablet 3  . Multiple Vitamin (MULTIVITAMIN) tablet Take 1 tablet by mouth daily. OTC    . pantoprazole (PROTONIX) 40 MG tablet TAKE 1 TABLET BY MOUTH EVERY DAY 90 tablet 1  . rosuvastatin (CRESTOR) 10 MG tablet Take 10 mg by mouth daily.    . sitaGLIPtin-metformin (JANUMET) 50-1000 MG per tablet Take 1 tablet by mouth 2 (two) times daily with a meal.    . Zinc Sulfate (ZINC 15 PO) Take by mouth.     No current facility-administered medications on file prior to visit.    Allergies:   Allergies  Allergen Reactions  . Codeine Rash  . Erythromycin Rash  . Penicillins Rash    Has patient had a PCN reaction causing immediate rash, facial/tongue/throat swelling, SOB or lightheadedness with hypotension: Yes Has patient had a PCN reaction causing severe rash involving mucus membranes or skin necrosis: No Has patient had a PCN reaction that required hospitalization: No Has patient had a PCN reaction occurring within the last 10 years: Yes If all of the above answers are "NO", then may proceed with Cephalosporin use.   . Sulfa Antibiotics Rash  . Sulfonamide Derivatives Rash  . Vicodin [Hydrocodone-Acetaminophen] Rash     Physical Exam  Vitals:   07/09/19 0857  BP: 130/75  Pulse: 87  Temp: (!) 96.9 F (36.1 C)  Weight: 182 lb (82.6 kg)  Height:  5\' 6"  (1.676 m)   Body mass index is 29.38 kg/m. No exam data present  General: well developed,  well nourished, pleasant middle-aged African-American female, seated, in no evident distress Head: head normocephalic and atraumatic.   Neck: supple with no carotid or supraclavicular bruits Cardiovascular: regular rate and rhythm, no murmurs Musculoskeletal: no deformity Skin:  no rash/petichiae Vascular:  Normal pulses all extremities  Neurologic Exam Mental Status: Awake and fully alert.  Occasional dysarthria with conversation and speech hesitation.  Oriented to place and time. Recent memory diminished and remote memory intact. Attention span, concentration and fund of knowledge appropriate during visit. Mood and affect appropriate.  Cranial Nerves: Pupils equal, briskly reactive to light. Extraocular movements full without nystagmus. Visual fields full to confrontation. Hearing intact. Facial sensation intact. Face, tongue, palate moves normally and symmetrically.  Motor: Normal bulk and tone.  Full strength throughout all tested extremities Sensory.: intact to touch , pinprick , position and vibratory sensation.  Coordination: Rapid alternating movements normal in all extremities. Finger-to-nose and heel-to-shin performed accurately bilaterally. Gait and Station: Arises from chair without difficulty. Stance is normal. Gait demonstrates normal stride length and mild imbalance greater with turns.  Tandem gait not attempted. Reflexes: 1+ and symmetric. Toes downgoing.        ASSESSMENT: Leah Norton is a 64 y.o. year old female here with right pontine infarct on 01/16/2018 secondary to small vessel disease. Vascular risk factors include HTN, HLD, DM and tobacco use.  Residual stroke deficits of intermittent imbalance, wax/wane dysarthria and short-term memory impairment/delayed recall.  Underwent neurocognitive testing on 04/21/2019 with impairment of executive functioning with testing supportive of disability status.    PLAN:  1. Right pons infarct:  -Patient currently pursuing  permanent disability due to extensive evaluations with higher executive functioning impairment and decreased endurance -Referral placed to neuro rehab PT for hopeful benefit of decreased endurance and improving functional tolerance. -Continue clopidogrel 75 mg daily  and Crestor for secondary stroke prevention.   -Maintain strict control of hypertension with blood pressure goal below 130/90, diabetes with hemoglobin A1c goal below 6.5% and cholesterol with LDL cholesterol (bad cholesterol) goal below 70 mg/dL.  I also advised the patient to eat a healthy diet with plenty of whole grains, cereals, fruits and vegetables, exercise regularly with at least 30 minutes of continuous activity daily and maintain ideal body weight.  2. HTN: Stable.  Continue to follow PCP for monitoring management 3. HLD: Stable.  Continue to follow with PCP for monitoring management 4. DMII: Continue to follow with PCP for monitoring and management   Follow-up in 6 months or call earlier if needed   I spent 26 minutes of face-to-face and non-face-to-face time with patient.  This included previsit chart review, lab review, study review, order entry, electronic health record documentation, patient education regarding prior stroke, residual deficits, importance of managing stroke risk factors and answered all questions to patient satisfaction     Frann Rider, [facility]  [facility] [address] [place], [place] [address]  Phone [phone] Fax [phone] Note: This document was prepared with digital dictation and possible smart phrase technology. Any transcriptional errors that result from this process are unintentional.

## 2019-07-09 NOTE — Patient Instructions (Signed)
Per neuropsychology recommendations, return to work not recommended due to cognitive deficits -ongoing assistance with disability  Referral placed to physical therapy for help with improvement of endurance and mobility tolerance   Continue clopidogrel 75 mg daily  and Crestor for secondary stroke prevention  Continue to follow up with PCP regarding cholesterol and blood pressure management   Maintain strict control of hypertension with blood pressure goal below 130/90, diabetes with hemoglobin A1c goal below 6.5% and cholesterol with LDL cholesterol (bad cholesterol) goal below 70 mg/dL. I also advised the patient to eat a healthy diet with plenty of whole grains, cereals, fruits and vegetables, exercise regularly and maintain ideal body weight.  Followup in the future with me in 6 months or call earlier if needed       Thank you for coming to see [place] at [facility]. I hope we have been able to provide you high quality care today.  You may receive a patient satisfaction survey over the next few weeks. We would appreciate your feedback and comments so that we may continue to improve ourselves and the health of our patients.

## 2019-07-13 NOTE — Progress Notes (Signed)
I agree with the above plan 

## 2019-07-14 ENCOUNTER — Ambulatory Visit: Payer: 59 | Attending: Family

## 2019-07-14 DIAGNOSIS — Z23 Encounter for immunization: Secondary | ICD-10-CM

## 2019-07-14 NOTE — Progress Notes (Signed)
   Covid-19 Vaccination Clinic  Name:  Leah Norton    MRN: [id number] DOB: 20-Feb-1956  07/14/2019  Leah Norton was observed post Covid-19 immunization for 15 minutes without incident. She was provided with Vaccine Information Sheet and instruction to access the V-Safe system.   Leah Norton was instructed to call 911 with any severe reactions post vaccine: Marland Kitchen Difficulty breathing  . Swelling of face and throat  . A fast heartbeat  . A bad rash all over body  . Dizziness and weakness   Immunizations Administered    Name Date Dose VIS Date Route   Moderna COVID-19 Vaccine 07/14/2019 10:53 AM 0.5 mL 02/2019 Intramuscular   Manufacturer: Moderna   Lot: [id number]   [id number]

## 2019-07-31 IMAGING — CT CT ANGIO NECK
1 of 12 series · 5 of 33 positions shown · IV contrast (OMNI 350)
Comparison: MR brain earlier today.

CLINICAL DATA: Follow-up stroke.

EXAM:
CT ANGIOGRAPHY HEAD AND NECK
TECHNIQUE: Multidetector CT imaging of the head and neck was performed using
the standard protocol during bolus administration of intravenous
contrast. Multiplanar CT image reconstructions and MIPs were
obtained to evaluate the vascular anatomy. Carotid stenosis
measurements (when applicable) are obtained utilizing NASCET
criteria, using the distal internal carotid diameter as the
denominator.
CONTRAST:  50mL JFVY81-YUS IOPAMIDOL (JFVY81-YUS) INJECTION 76%

[Series 11: cta neck axial · axial · 0.48mm/px · z∈[-203,+8]mm · 5 of 317 slices shown]
[im 53/317  soft-tissue]
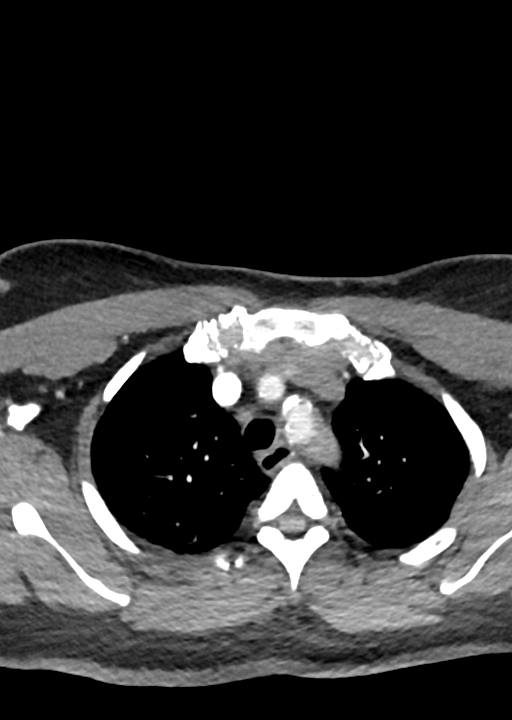
[im 106/317  bone]
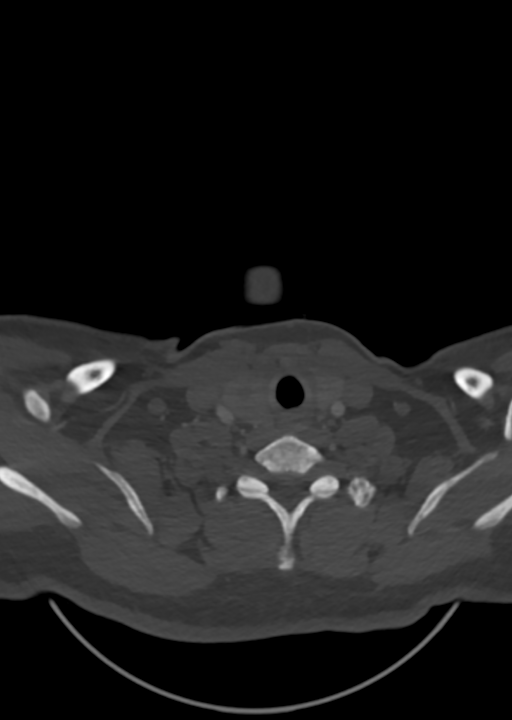
[im 159/317  soft-tissue]
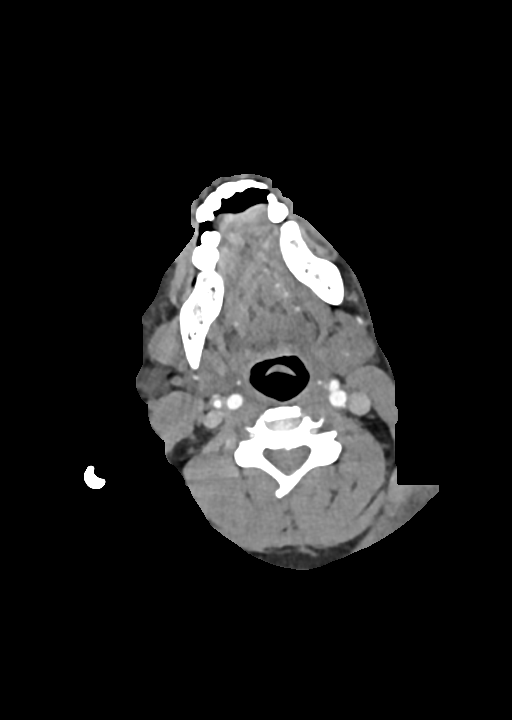
[im 211/317  bone]
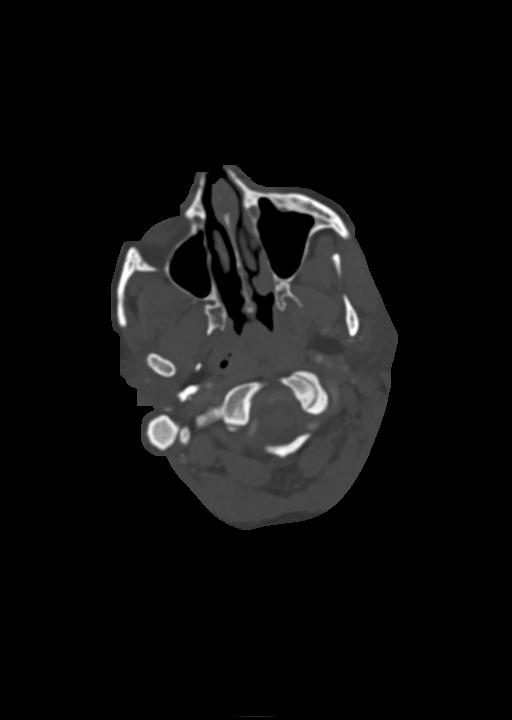
[im 264/317  soft-tissue]
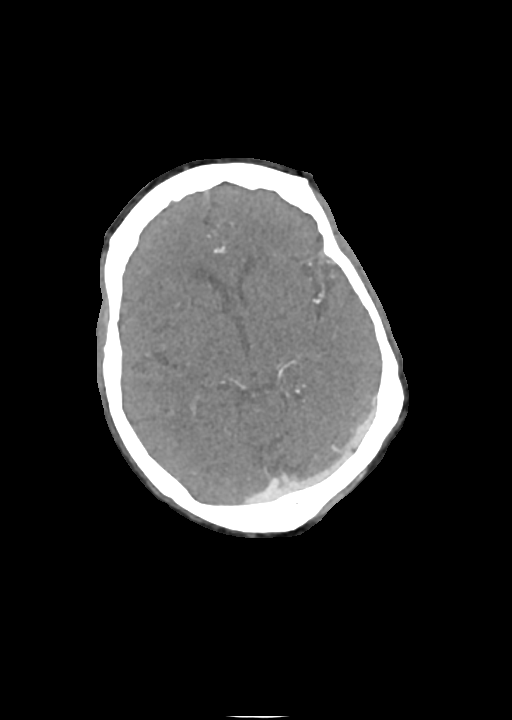

[5 of 33 positions shown; findings below may reference images not displayed]

FINDINGS: CT HEAD FINDINGS

Brain: No evidence for acute infarction, hemorrhage, mass lesion,
hydrocephalus, or extra-axial fluid. Slight premature for age
atrophy. No significant white matter disease.

Cytotoxic edema within the acute RIGHT paramedian pontine infarct is
not visible on CT.

Vascular: Reported separately

Skull: Intact.

Sinuses: Clear

Orbits: Unremarkable.

Review of the MIP images confirms the above findings

CTA NECK FINDINGS

Aortic arch: Bovine trunk, common origin innominate and LEFT
subclavian. Aberrant origin RIGHT subclavian. Imaged portion shows
no evidence of aneurysm or dissection. No significant stenosis of
the major arch vessel origins.

Right carotid system: No evidence of dissection, stenosis (50% or
greater) or occlusion. Minor atheromatous change at the bifurcation.

Left carotid system: No evidence of dissection, stenosis (50% or
greater) or occlusion. Minor atheromatous change the bifurcation.

Vertebral arteries: Codominant. No evidence of dissection, stenosis
(50% or greater) or occlusion.

Skeleton: Spondylosis.

Other neck: No masses.

Upper chest: No pneumothorax or consolidation.

Review of the MIP images confirms the above findings

CTA HEAD FINDINGS

Anterior circulation: Calcification of the cavernous internal
carotid arteries consistent with cerebrovascular atherosclerotic
disease. No signs of intracranial large vessel occlusion. 50-75%
stenosis at the origin RIGHT M1 MCA from the internal carotid
artery. No similar findings on the LEFT. Codominant anterior
cerebral arteries. No MCA M2 or M3 branch occlusion.

Posterior circulation: Minor atheromatous change LEFT V4 vertebral.
Both vertebrals contribute to formation of the basilar. There is
basilar dolichoectasia without focal stenosis or dissection. Slight
hypoplasia due to fetal LEFT PCA.

Venous sinuses: As permitted by contrast timing, patent.

Anatomic variants: LEFT fetal PCA.

Delayed phase: No abnormal intracranial enhancement.

Review of the MIP images confirms the above findings
IMPRESSION: RIGHT paramedian brainstem infarct is not visible on pre or
postcontrast CT imaging. No visible hemorrhagic transformation.

No posterior circulation/basilar artery stenosis or dissection is
observed.

Minor calcific atheromatous change of the BILATERAL cavernous
carotid arteries. A 50-75% stenosis proximal RIGHT M1 MCA,
noncontributory with respect to the acute ischemia.

Variant arch anatomy consisting of bovine trunk, and aberrant RIGHT
subclavian.

## 2019-08-17 ENCOUNTER — Other Ambulatory Visit: Payer: Self-pay | Admitting: Nurse Practitioner

## 2019-08-26 ENCOUNTER — Other Ambulatory Visit: Payer: Self-pay

## 2019-08-26 MED ORDER — PANTOPRAZOLE SODIUM 40 MG PO TBEC: 40.0000 mg | DELAYED_RELEASE_TABLET | Freq: Every day | ORAL | 2 refills | Status: DC

## 2019-10-13 ENCOUNTER — Ambulatory Visit (INDEPENDENT_AMBULATORY_CARE_PROVIDER_SITE_OTHER): Payer: 59 | Admitting: Podiatry

## 2019-10-13 ENCOUNTER — Ambulatory Visit (INDEPENDENT_AMBULATORY_CARE_PROVIDER_SITE_OTHER): Payer: 59

## 2019-10-13 ENCOUNTER — Other Ambulatory Visit: Payer: Self-pay | Admitting: Podiatry

## 2019-10-13 ENCOUNTER — Other Ambulatory Visit: Payer: Self-pay

## 2019-10-13 DIAGNOSIS — M779 Enthesopathy, unspecified: Secondary | ICD-10-CM | POA: Diagnosis not present

## 2019-10-13 DIAGNOSIS — M19071 Primary osteoarthritis, right ankle and foot: Secondary | ICD-10-CM | POA: Diagnosis not present

## 2019-10-13 DIAGNOSIS — M25571 Pain in right ankle and joints of right foot: Secondary | ICD-10-CM

## 2019-10-13 DIAGNOSIS — G8929 Other chronic pain: Secondary | ICD-10-CM
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
## 2019-10-15 ENCOUNTER — Other Ambulatory Visit: Payer: Self-pay | Admitting: Podiatry

## 2019-10-15 DIAGNOSIS — M779 Enthesopathy, unspecified: Secondary | ICD-10-CM

## 2019-11-23 ENCOUNTER — Other Ambulatory Visit: Payer: 59 | Admitting: Orthotics

## 2020-01-12 NOTE — Progress Notes (Signed)
[facility] [address] [place]. [place] [address] [phone]       OFFICE FOLLOW UP NOTE  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 Leonie Man  CHIEF COMPLAINT:  Chief Complaint  Leah Norton presents with  . Follow-up    stroke fu, rm 9, alone, pt states she is doing well.     HPI:   Today, 01/12/2020, Leah Norton returns for stroke follow-up. Stable since prior visit without new or worsening stroke/TIA symptoms.  Reports residual occasional dysarthria, cognitive impairment and imbalance which has been stable without worsening. She has since been approved for permanent disability due to residual deficits. Remains on Plavix and Crestor for secondary stroke prevention side effects.  Reports recent lab work by PCP which was satisfactory but unable to personally view via epic.  Blood pressure today 138/80.  Monitors BP at home which has been stable.  On Jardiance and Janumet per PCP. No concerns at this time.    History provided for reference purposes only Update 07/09/2019 JM: Leah Norton returns for stroke follow-up.  Residual stroke deficits of cognitive impairment, occasional dysarthria, imbalance and decreased endurance.  Since prior visit, underwent neurocognitive evaluation with impairment executive functioning with findings supportive of disability status.  Also underwent FCE 05/06/2019 rated at light level work for 8-hour day.  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 She has applied for permanent disability. She denies worsening stroke symptoms or new stroke/TIA symptoms.  She has been slowly  increasing exercise with daily walks but does continue to feel limited due to decreased endurance and occasional imbalance.  Continues on Plavix and Crestor for secondary stroke prevention.  Blood pressure today 130/75. Glucose levels variable with recent A1c 8.2. Continues to follow with Dr. Delfina Redwood for DM, HTN and HLD management. No further concerns at this time.  Update 04/09/2019: Leah Norton is being seen today for stroke follow up and request for ongoing short-term disability versus possible return to work.  After prior visit, attempted to have Leah Norton return to work with dayshift hours for max of 8-hour shifts but apparently she was not able to return to work due to her employer denying recommended restrictions.  Katharine Look, RN spoke to Leah Norton and she was planning on looking for a different type of job she has not found one at this time. She continues to complain of intermittent imbalance that can worsen at certain times a day, difficulty with walking long distances due to shortness of breath, dysarthria wax/wane and delayed recall/short-term memory concerns.  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  Shortness of breath on exertion has been slowly worsening but she does endorse sedentary lifestyle with no routine exercise and limited activity throughout the day.  Continues on Plavix and Crestor for secondary stroke prevention without side effects.  Blood pressure today elevated at 161/91, asymptomatic, but does monitor at home and typically stable.  Denies new or worsening stroke/TIA symptoms.  Update 11/19/2018: Leah Norton is being seen today for stroke follow-up accompanied by her sister.  Residual  deficits mild dysarthria, short term memory deficits, and occasional balance difficulties.  She continues to live independently maintaining ADLs and IADLs without  difficulty.  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  It was recommended to return to work with no more than 8-hour shifts during the day but unfortunately her current employer was unable to satisfy these recommendations.  Our office has been assisting with disability.  She is asking for another work letter with my recommendations as they were unable to satisfy before due to beginning of pandemic.  Continues on Plavix and Crestor for secondary stroke prevention without side effects.  Blood pressure stable at 136/74.  She does have concerns regarding right ankle swelling which is chronic and currently being followed by PCP regarding this concern.  Denies new or worsening stroke/TIA symptoms.  Update 05/28/2018: Leah Norton is a 64 year old female who is being seen today for follow-up visit regarding right pontine infarct in 01/2018 and is accompanied by her sister.  She continues to have difficulties intermittently with dysarthria but has had improvement of gait and balance difficulties.  She unfortunately was unable to continue to participate in therapy sessions due to financial reasons.  She does endorse continuation of exercises at home.  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  She currently works 12-hour night shifts.  She continues on Plavix without side effects of bleeding or bruising.  She continues on rosuvastatin without side effects myalgias.  Blood pressure today 152/78.  Leah Norton does endorse mild headache over the past couple days located left occipital region but denies any other neurological symptoms.  Denies new or worsening stroke/TIA symptoms.  Initial visit 02/26/18: Leah Norton is being seen today for hospital follow-up and is accompanied by her sister.  Leah Norton continues to  have left hemiparesis but has been improving along with intermittent balance difficulties and is currently participating in PT/OT.  She also continues to have dysarthria with occasional aphasia and cognitive slowing but continues to participate in speech therapy.  She has since returned home where she is able to complete all her ADLs and some assistance with IADLs from her sister.  She has continued on aspirin and Plavix without side effects of bleeding or bruising.  Continues on Crestor without side effects of myalgias.  Blood pressure today 159/88 and Leah Norton does monitor at home with typical SBP 120-130.  She has completely quit smoking since hospital discharge.  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  She is currently receiving FMLA which was provided by PCP.  She does endorse snoring with insomnia and daytime fatigue which has been present prior to her stroke but does endorse worsening fatigue since hospital discharge.  She denies prior sleep study.  She is also been having left side ear pains which was also present prior to her stroke and has been seen by ENT and per Leah Norton, she was told everything was satisfactory.  She does endorse neck pain on left side with some radiating pain up to her ear and a discomfort surrounding her ear up into her head occasionally causing headaches.  No further concerns at this time.  Denies new or worsening stroke/TIA symptoms.  Stroke admission 01/15/2018: Leah Norton is a 64 y.o. female with history of CAD s/p DES to pLAD in 12/17, hypercholesterolemia, HTN, DM2 and smoking   who presented with slurred speech, balance problems, AMS, and left sided weakness. She did not receive IV t-PA due to late presentation.   CT head reviewed and negative for acute infarct.  MRI brain reviewed and showed subcentimeter acute early subacute infarct within the right pons.  CTA head and neck showed a 50 to 75% stenosis  proximal right M1 MCA which was noncontributory with respect to the acute ischemia.  2D echo showed an EF of 40 to 45%.  LDL 77 and A1c 6.6.  Leah Norton was on aspirin 81 mg PTA and recommended DAPT for 3 weeks on Plavix alone.  Etiology of infarct likely due to small vessel disease source.  Recommended to continue to follow with PCP in regards to HTN, HLD and DM management along with smoking cessation counseling provided.  Leah Norton discharged home in stable condition with recommendations of outpatient PT.     ROS:   14 system review of systems performed and negative with exception of those listed in HPI   PMH:  Past Medical History:  Diagnosis Date  . Anginal pain ([place])   . Arthritis    "right anikle" (02/29/2016)  . CAD in native artery 02/08/2016   Three-vessel coronary artery calcification on CT scan  . Complication of anesthesia    "had reaction w/hives, rash, itching" (02/29/2016)  . Constipation   . Coronary artery disease    12/17 PCI with DESx1 to pLAD EF normal  . Enlarged heart   . GERD (gastroesophageal reflux disease)   . High cholesterol   . Hypertension   . Migraine    hx  . Stroke ([place])   . Symptomatic anemia 07/10/2016  . Type II diabetes mellitus ([facility])     PSH:  Past Surgical History:  Procedure Laterality Date  . ABDOMINAL HYSTERECTOMY    . CARDIAC CATHETERIZATION N/A 02/29/2016   Procedure: Left Heart Cath and Coronary Angiography;  Surgeon: Belva Crome, MD;  Location: [facility];  Service: Cardiovascular;  Laterality: N/A;  . CARDIAC CATHETERIZATION N/A 02/29/2016   Procedure: Coronary Stent Intervention;  Surgeon: Belva Crome, MD;  Location: [facility];  Service: Cardiovascular;  Laterality: N/A;  . CARDIAC CATHETERIZATION  2005   "no blockages"  . COLONOSCOPY WITH PROPOFOL N/A 03/15/2014   Procedure: COLONOSCOPY WITH PROPOFOL;  Surgeon: Garlan Fair, MD;  Location: [facility];  Service: Endoscopy;  Laterality: N/A;  . CORONARY  ANGIOPLASTY WITH STENT PLACEMENT    . ESOPHAGOGASTRODUODENOSCOPY N/A 07/11/2016   Procedure: ESOPHAGOGASTRODUODENOSCOPY (EGD);  Surgeon: Laurence Spates, MD;  Location: [facility];  Service: Endoscopy;  Laterality: N/A;  . FRACTURE SURGERY    . MULTIPLE TOOTH EXTRACTIONS     teeth removed  . ORIF ANKLE FRACTURE Right 11/12/2012   Procedure: OPEN REDUCTION INTERNAL FIXATION (ORIF) ANKLE FRACTURE;  Surgeon: Marianna Payment, MD;  Location: [facility];  Service: Orthopedics;  Laterality: Right;  Open reduction internal fixation ankle fracture, trimalleolar    Social History:  Social History   Socioeconomic History  . Marital status: Single    Spouse name: Not on file  . Number of children: Not on file  . Years of education: Not on file  . Highest education level: Not on file  Occupational History  . Not on file  Tobacco Use  . Smoking status: Former Smoker    Packs/day:  0.50    Years: 42.00    Pack years: 21.00    Types: Cigarettes    Quit date: 01/15/2018    Years since quitting: 1.9  . Smokeless tobacco: Never Used  Vaping Use  . Vaping Use: Never used  Substance and Sexual Activity  . Alcohol use: Yes    Comment: occasionally  . Drug use: No  . Sexual activity: Not on file  Other Topics Concern  . Not on file  Social History Narrative  . Not on file   Social Determinants of Health   Financial Resource Strain:   . Difficulty of Paying Living Expenses: Not on file  Food Insecurity:   . Worried About Charity fundraiser in the Last Year: Not on file  . Ran Out of Food in the Last Year: Not on file  Transportation Needs:   . Lack of Transportation (Medical): Not on file  . Lack of Transportation (Non-Medical): Not on file  Physical Activity:   . Days of Exercise per Week: Not on file  . Minutes of Exercise per Session: Not on file  Stress:   . Feeling of Stress : Not on file  Social Connections:   . Frequency of Communication with Friends and Family: Not on file  .  Frequency of Social Gatherings with Friends and Family: Not on file  . Attends Religious Services: Not on file  . Active Member of Clubs or Organizations: Not on file  . Attends Archivist Meetings: Not on file  . Marital Status: Not on file  Intimate Partner Violence:   . Fear of Current or Ex-Partner: Not on file  . Emotionally Abused: Not on file  . Physically Abused: Not on file  . Sexually Abused: Not on file    Family History:  Family History  Problem Relation Age of Onset  . Hypertension Mother     Medications:   Current Outpatient Medications on File Prior to Visit  Medication Sig Dispense Refill  . buPROPion (WELLBUTRIN SR) 150 MG 12 hr tablet Take 150 mg by mouth 2 (two) times daily.     . chlorthalidone (HYGROTON) 25 MG tablet Take 25 mg by mouth every morning.    . Cholecalciferol (VITAMIN D3) 1000 units CAPS Take 2,000 Units by mouth daily.    . clopidogrel (PLAVIX) 75 MG tablet Take 75 mg by mouth daily.    . cyclobenzaprine (FLEXERIL) 5 MG tablet Take 1 tablet (5 mg total) by mouth at bedtime. 14 tablet 0  . furosemide (LASIX) 20 MG tablet TAKE 1 TABLET(20 MG) BY MOUTH DAILY AS NEEDED 30 tablet 0  . JARDIANCE 10 MG TABS tablet Take 10 mg by mouth daily.    . Multiple Vitamin (MULTIVITAMIN) tablet Take 1 tablet by mouth daily. OTC    . pantoprazole (PROTONIX) 40 MG tablet Take 1 tablet (40 mg total) by mouth daily. 90 tablet 2  . rosuvastatin (CRESTOR) 10 MG tablet Take 10 mg by mouth daily.    . sitaGLIPtin-metformin (JANUMET) 50-1000 MG per tablet Take 1 tablet by mouth 2 (two) times daily with a meal.    . Zinc Sulfate (ZINC 15 PO) Take by mouth.    Marland Kitchen atenolol (TENORMIN) 50 MG tablet Take 1 tablet (50 mg total) by mouth daily. 180 tablet 3  . losartan (COZAAR) 100 MG tablet Take 1 tablet (100 mg total) by mouth daily. 90 tablet 3   No current facility-administered medications on file prior to visit.  Allergies:   Allergies  Allergen Reactions  .  Codeine Rash  . Erythromycin Rash  . Penicillins Rash    Has Leah Norton had a PCN reaction causing immediate rash, facial/tongue/throat swelling, SOB or lightheadedness with hypotension: Yes Has Leah Norton had a PCN reaction causing severe rash involving mucus membranes or skin necrosis: No Has Leah Norton had a PCN reaction that required hospitalization: No Has Leah Norton had a PCN reaction occurring within the last 10 years: Yes If all of the above answers are "NO", then may proceed with Cephalosporin use.   . Sulfa Antibiotics Rash  . Sulfonamide Derivatives Rash  . Vicodin [Hydrocodone-Acetaminophen] Rash     Physical Exam  Vitals:   01/13/20 0847  BP: 138/80  Pulse: 80  Weight: 181 lb (82.1 kg)  Height: 5\' 5"  (1.651 m)   Body mass index is 30.12 kg/m. No exam data present  General: well developed, well nourished, pleasant middle-aged African-American female, seated, in no evident distress Head: head normocephalic and atraumatic.   Neck: supple with no carotid or supraclavicular bruits Cardiovascular: regular rate and rhythm, no murmurs Musculoskeletal: no deformity Skin:  no rash/petichiae Vascular:  Normal pulses all extremities  Neurologic Exam Mental Status: Awake and fully alert.  Mild dysarthria with conversation and speech hesitation.  Oriented to place and time. Recent memory diminished and remote memory intact. Attention span, concentration and fund of knowledge appropriate during visit. Mood and affect appropriate.  Cranial Nerves: Pupils equal, briskly reactive to light. Extraocular movements full without nystagmus. Visual fields full to confrontation. Hearing intact. Facial sensation intact. Face, tongue, palate moves normally and symmetrically.  Motor: Normal bulk and tone.  Full strength throughout all tested extremities Sensory.: intact to touch , pinprick , position and vibratory sensation.  Coordination: Rapid alternating movements normal in all extremities.  Finger-to-nose and heel-to-shin performed accurately bilaterally. Gait and Station: Arises from chair without difficulty. Stance is normal. Gait demonstrates normal stride length and mild imbalance greater with turns.  Difficulty performing tandem walk and heel toe walk.  Romberg negative. Reflexes: 1+ and symmetric. Toes downgoing.        ASSESSMENT/PLAN: Leah Norton is a 64 y.o. year old female here with right pontine infarct on 01/16/2018 secondary to small vessel disease. Vascular risk factors include HTN, HLD, DM and tobacco use.  Residual stroke deficits of intermittent imbalance, wax/wane dysarthria and short-term memory impairment/delayed recall.  Underwent neurocognitive testing on 04/21/2019 with impairment of executive functioning with testing supportive of disability status.    1. Right pons infarct:  a. Residual occasional dysarthria, imbalance and cognitive impairment.  Remains on permanent disability.   b. Provided handicap placard as requested c. Continue clopidogrel 75 mg daily  and Crestor for secondary stroke prevention.   d. Discussed secondary stroke prevention measures and importance of close PCP follow-up for aggressive stroke risk factor management 2. HTN: BP goal<130/90.  Stable today.  On losartan, hygroton and atenolol per PCP 3. HLD: LDL goal<70. Per Leah Norton, recent lab work satisfactory.  On Crestor 10 mg daily per PCP 4. DMII: A1c goal<7. On Janumet, and Jardiance per PCP   Overall stable from a stroke standpoint and recommend follow-up on an as-needed basis   CC:  [facility] provider: Dr. Coralee Rud, Jori Moll, MD    I spent 25 minutes of face-to-face and non-face-to-face time with Leah Norton.  This included previsit chart review, lab review, study review, order entry, electronic health record documentation, Leah Norton education regarding prior stroke, residual deficits, secondary stroke prevention measures and  importance of managing stroke risk factors and answered  all questions to Leah Norton satisfaction   Leah Norton, [facility]  [facility] [address] [place], [place] [address]  Phone [phone] Fax [phone] Note: This document was prepared with digital dictation and possible smart phrase technology. Any transcriptional errors that result from this process are unintentional.

## 2020-01-13 ENCOUNTER — Encounter: Payer: Self-pay | Admitting: Adult Health

## 2020-01-13 ENCOUNTER — Other Ambulatory Visit: Payer: Self-pay

## 2020-01-13 ENCOUNTER — Ambulatory Visit: Payer: BC Managed Care – PPO | Admitting: Adult Health

## 2020-01-13 VITALS — BP 138/80 | HR 80 | Ht 65.0 in | Wt 181.0 lb

## 2020-01-13 DIAGNOSIS — I69319 Unspecified symptoms and signs involving cognitive functions following cerebral infarction: Secondary | ICD-10-CM | POA: Diagnosis not present

## 2020-01-13 DIAGNOSIS — I69398 Other sequelae of cerebral infarction: Secondary | ICD-10-CM | POA: Diagnosis not present

## 2020-01-13 DIAGNOSIS — I69322 Dysarthria following cerebral infarction: Secondary | ICD-10-CM | POA: Diagnosis not present

## 2020-01-13 DIAGNOSIS — I6350 Cerebral infarction due to unspecified occlusion or stenosis of unspecified cerebral artery: Secondary | ICD-10-CM

## 2020-01-13 DIAGNOSIS — R2689 Other abnormalities of gait and mobility: Secondary | ICD-10-CM

## 2020-01-13 NOTE — Progress Notes (Signed)
I agree with the above plan 

## 2020-01-13 NOTE — Patient Instructions (Signed)
Continue clopidogrel 75 mg daily  and Crestor for secondary stroke prevention  Continue to follow up with PCP regarding cholesterol, blood pressure and diabetes management  Maintain strict control of hypertension with blood pressure goal below 130/90, diabetes with hemoglobin A1c goal below 7% and cholesterol with LDL cholesterol (bad cholesterol) goal below 70 mg/dL.       Thank you for coming to see [place] at [facility]. I hope we have been able to provide you high quality care today.  You may receive a patient satisfaction survey over the next few weeks. We would appreciate your feedback and comments so that we may continue to improve ourselves and the health of our patients.    Stroke Prevention Some medical conditions and lifestyle choices can lead to a higher risk for a stroke. You can help to prevent a stroke by making nutrition, lifestyle, and other changes. What nutrition changes can be made?   Eat healthy foods. ? Choose foods that are high in fiber. These include:  Fresh fruits.  Fresh vegetables.  Whole grains. ? Eat at least 5 or more servings of fruits and vegetables each day. Try to fill half of your plate at each meal with fruits and vegetables. ? Choose lean protein foods. These include:  Lowfat (lean) cuts of meat.  Chicken without skin.  Fish.  Tofu.  Beans.  Nuts. ? Eat low-fat dairy products. ? Avoid foods that:  Are high in salt (sodium).  Have saturated fat.  Have trans fat.  Have cholesterol.  Are processed.  Are premade.  Follow eating guidelines as told by your doctor. These may include: ? Reducing how many calories you eat and drink each day. ? Limiting how much salt you eat or drink each day to 1,500 milligrams (mg). ? Using only healthy fats for cooking. These include:  Olive oil.  Canola oil.  Sunflower oil. ? Counting how many carbohydrates you eat and drink each day. What lifestyle changes can be  made?  Try to stay at a healthy weight. Talk to your doctor about what a good weight is for you.  Get at least 30 minutes of moderate physical activity at least 5 days a week. This can include: ? Fast walking. ? Biking. ? Swimming.  Do not use any products that have nicotine or tobacco. This includes cigarettes and e-cigarettes. If you need help quitting, ask your doctor. Avoid being around tobacco smoke in general.  Limit how much alcohol you drink to no more than 1 drink a day for nonpregnant women and 2 drinks a day for men. One drink equals 12 oz of beer, 5 oz of wine, or 1 oz of hard liquor.  Do not use drugs.  Avoid taking birth control pills. Talk to your doctor about the risks of taking birth control pills if: ? You are over 58 years old. ? You smoke. ? You get migraines. ? You have had a blood clot. What other changes can be made?  Manage your cholesterol. ? It is important to eat a healthy diet. ? If your cholesterol cannot be managed through your diet, you may also need to take medicines. Take medicines as told by your doctor.  Manage your diabetes. ? It is important to eat a healthy diet and to exercise regularly. ? If your blood sugar cannot be managed through diet and exercise, you may need to take medicines. Take medicines as told by your doctor.  Control your high blood pressure (hypertension). ?  Try to keep your blood pressure below 130/80. This can help lower your risk of stroke. ? It is important to eat a healthy diet and to exercise regularly. ? If your blood pressure cannot be managed through diet and exercise, you may need to take medicines. Take medicines as told by your doctor. ? Ask your doctor if you should check your blood pressure at home. ? Have your blood pressure checked every year. Do this even if your blood pressure is normal.  Talk to your doctor about getting checked for a sleep disorder. Signs of this can include: ? Snoring a lot. ? Feeling  very tired.  Take over-the-counter and prescription medicines only as told by your doctor. These may include aspirin or blood thinners (antiplatelets or anticoagulants).  Make sure that any other medical conditions you have are managed. Where to find more information  American Stroke Association: www.strokeassociation.org  National Stroke Association: www.stroke.org Get help right away if:  You have any symptoms of stroke. "BE FAST" is an easy way to remember the main warning signs: ? B - Balance. Signs are dizziness, sudden trouble walking, or loss of balance. ? E - Eyes. Signs are trouble seeing or a sudden change in how you see. ? F - Face. Signs are sudden weakness or loss of feeling of the face, or the face or eyelid drooping on one side. ? A - Arms. Signs are weakness or loss of feeling in an arm. This happens suddenly and usually on one side of the body. ? S - Speech. Signs are sudden trouble speaking, slurred speech, or trouble understanding what people say. ? T - Time. Time to call emergency services. Write down what time symptoms started.  You have other signs of stroke, such as: ? A sudden, very bad headache with no known cause. ? Feeling sick to your stomach (nausea). ? Throwing up (vomiting). ? Jerky movements you cannot control (seizure). These symptoms may represent a serious problem that is an emergency. Do not wait to see if the symptoms will go away. Get medical help right away. Call your local emergency services (911 in the [place] Do not drive yourself to the hospital. Summary  You can prevent a stroke by eating healthy, exercising, not smoking, drinking less alcohol, and treating other health problems, such as diabetes, high blood pressure, or high cholesterol.  Do not use any products that contain nicotine or tobacco, such as cigarettes and e-cigarettes.  Get help right away if you have any signs or symptoms of a stroke. This information is not intended to  replace advice given to you by your health care provider. Make sure you discuss any questions you have with your health care provider. Document Revised: 04/24/2018 Document Reviewed: 05/30/2016 Elsevier Patient Education  [place].

## 2020-02-01 ENCOUNTER — Ambulatory Visit: Payer: 59

## 2020-02-02 ENCOUNTER — Ambulatory Visit: Payer: 59 | Attending: Internal Medicine

## 2020-02-02 DIAGNOSIS — Z23 Encounter for immunization: Secondary | ICD-10-CM

## 2020-02-02 NOTE — Progress Notes (Signed)
   Covid-19 Vaccination Clinic  Name:  SHANIKWA STATE    MRN: [id number] DOB: 1955-10-31  02/02/2020  Ms. Mun was observed post Covid-19 immunization for 15 minutes without incident. She was provided with Vaccine Information Sheet and instruction to access the V-Safe system.   Ms. Herst was instructed to call 911 with any severe reactions post vaccine: Marland Kitchen Difficulty breathing  . Swelling of face and throat  . A fast heartbeat  . A bad rash all over body  . Dizziness and weakness   Immunizations Administered    No immunizations on file.

## 2020-04-13 ENCOUNTER — Other Ambulatory Visit: Payer: Self-pay

## 2020-04-13 MED ORDER — FUROSEMIDE 20 MG PO TABS: ORAL_TABLET | ORAL | 2 refills | Status: DC

## 2020-07-06 DIAGNOSIS — Z79899 Other long term (current) drug therapy: Secondary | ICD-10-CM | POA: Diagnosis not present

## 2020-07-06 DIAGNOSIS — K219 Gastro-esophageal reflux disease without esophagitis: Secondary | ICD-10-CM | POA: Diagnosis not present

## 2020-07-06 DIAGNOSIS — Z008 Encounter for other general examination: Secondary | ICD-10-CM | POA: Diagnosis not present

## 2020-07-06 DIAGNOSIS — E119 Type 2 diabetes mellitus without complications: Secondary | ICD-10-CM | POA: Diagnosis not present

## 2020-07-06 DIAGNOSIS — I10 Essential (primary) hypertension: Secondary | ICD-10-CM | POA: Diagnosis not present

## 2020-07-06 DIAGNOSIS — Z0000 Encounter for general adult medical examination without abnormal findings: Secondary | ICD-10-CM | POA: Diagnosis not present

## 2020-07-06 DIAGNOSIS — M62838 Other muscle spasm: Secondary | ICD-10-CM | POA: Diagnosis not present

## 2020-07-06 DIAGNOSIS — E785 Hyperlipidemia, unspecified: Secondary | ICD-10-CM | POA: Diagnosis not present

## 2020-07-06 DIAGNOSIS — G4700 Insomnia, unspecified: Secondary | ICD-10-CM | POA: Diagnosis not present

## 2020-07-06 DIAGNOSIS — Z1159 Encounter for screening for other viral diseases: Secondary | ICD-10-CM | POA: Diagnosis not present

## 2020-08-29 ENCOUNTER — Other Ambulatory Visit: Payer: Self-pay | Admitting: *Deleted

## 2020-08-29 MED ORDER — CLOPIDOGREL BISULFATE 75 MG PO TABS: 75.0000 mg | ORAL_TABLET | Freq: Every day | ORAL | 0 refills | Status: DC

## 2020-09-05 NOTE — Progress Notes (Signed)
Cardiology Office Note:    Date:  09/07/2020   ID:  Leah Norton, DOB 03-09-1956, MRN [id number]  PCP:  Cipriano Mile, NP  Cardiologist:  Sinclair Grooms, MD   Referring MD: Seward Carol, MD   Chief Complaint  Patient presents with   Coronary Artery Disease   Atrial Fibrillation    History of Present Illness:    Leah Norton is a 65 y.o. female with a hx of  with history of diabetes, hyper-lipidemia, hypertension, tobacco abuse, nonobstructive coronary disease by angiography in 2005. Recent nuclear study demonstrated evidence of ischemia. Subsequent catheterization demonstrated 75-85% proximal LAD which was stented with DES. Brilinta caused dyspnea and has been discontinued with substitution of Plavix.  She has intermittent episodes of chest discomfort that lasts less than 30 seconds.  Episodes are not precipitated by physical activity.  She does not have nitroglycerin.  We will provide nitro and instructions on how to use.  No orthopnea, PND, or difficulty with exertion.  She is getting less than 150 minutes of moderate activity per week.  Past Medical History:  Diagnosis Date   Anginal pain ([place])    Arthritis    "right anikle" (02/29/2016)   CAD in native artery 02/08/2016   Three-vessel coronary artery calcification on CT scan   Complication of anesthesia    "had reaction w/hives, rash, itching" (02/29/2016)   Constipation    Coronary artery disease    12/17 PCI with DESx1 to pLAD EF normal   Enlarged heart    GERD (gastroesophageal reflux disease)    High cholesterol    Hypertension    Migraine    hx   Stroke ([place])    Symptomatic anemia 07/10/2016   Type II diabetes mellitus ([place])     Past Surgical History:  Procedure Laterality Date   ABDOMINAL HYSTERECTOMY     CARDIAC CATHETERIZATION N/A 02/29/2016   Procedure: Left Heart Cath and Coronary Angiography;  Surgeon: Belva Crome, MD;  Location: [facility];  Service: Cardiovascular;   Laterality: N/A;   CARDIAC CATHETERIZATION N/A 02/29/2016   Procedure: Coronary Stent Intervention;  Surgeon: Belva Crome, MD;  Location: [facility];  Service: Cardiovascular;  Laterality: N/A;   CARDIAC CATHETERIZATION  2005   "no blockages"   COLONOSCOPY WITH PROPOFOL N/A 03/15/2014   Procedure: COLONOSCOPY WITH PROPOFOL;  Surgeon: Garlan Fair, MD;  Location: [facility];  Service: Endoscopy;  Laterality: N/A;   CORONARY ANGIOPLASTY WITH STENT PLACEMENT     ESOPHAGOGASTRODUODENOSCOPY N/A 07/11/2016   Procedure: ESOPHAGOGASTRODUODENOSCOPY (EGD);  Surgeon: Laurence Spates, MD;  Location: [facility];  Service: Endoscopy;  Laterality: N/A;   FRACTURE SURGERY     MULTIPLE TOOTH EXTRACTIONS     teeth removed   ORIF ANKLE FRACTURE Right 11/12/2012   Procedure: OPEN REDUCTION INTERNAL FIXATION (ORIF) ANKLE FRACTURE;  Surgeon: Marianna Payment, MD;  Location: [facility];  Service: Orthopedics;  Laterality: Right;  Open reduction internal fixation ankle fracture, trimalleolar    Current Medications: Current Meds  Medication Sig   atenolol (TENORMIN) 50 MG tablet Take 1 tablet (50 mg total) by mouth daily.   chlorthalidone (HYGROTON) 25 MG tablet Take 25 mg by mouth every morning.   Cholecalciferol (VITAMIN D3) 1000 units CAPS Take 2,000 Units by mouth daily.   clopidogrel (PLAVIX) 75 MG tablet Take 1 tablet (75 mg total) by mouth daily. Please keep upcoming appointment in June 2022 for future refills. Thank you   cyclobenzaprine (FLEXERIL)  5 MG tablet Take 1 tablet (5 mg total) by mouth at bedtime.   furosemide (LASIX) 20 MG tablet TAKE 1 TABLET(20 MG) BY MOUTH DAILY AS NEEDED. Please make yearly appt with Dr. Tamala Julian for April 2022 for future refills. Thank you 1st attempt   losartan (COZAAR) 100 MG tablet Take 1 tablet (100 mg total) by mouth daily.   Multiple Vitamin (MULTIVITAMIN) tablet Take 1 tablet by mouth daily. OTC   pantoprazole (PROTONIX) 40 MG tablet Take 1 tablet (40 mg total)  by mouth daily.   rosuvastatin (CRESTOR) 10 MG tablet Take 10 mg by mouth daily.   sitaGLIPtin-metformin (JANUMET) 50-1000 MG per tablet Take 1 tablet by mouth 2 (two) times daily with a meal.   Zinc Sulfate (ZINC 15 PO) Take by mouth.     Allergies:   Codeine, Erythromycin, Penicillins, Sulfa antibiotics, Sulfonamide derivatives, and Vicodin [hydrocodone-acetaminophen]   Social History   Socioeconomic History   Marital status: Single    Spouse name: Not on file   Number of children: Not on file   Years of education: Not on file   Highest education level: Not on file  Occupational History   Not on file  Tobacco Use   Smoking status: Former    Packs/day: 0.50    Years: 42.00    Pack years: 21.00    Types: Cigarettes    Quit date: 01/15/2018    Years since quitting: 2.6   Smokeless tobacco: Never  Vaping Use   Vaping Use: Never used  Substance and Sexual Activity   Alcohol use: Yes    Comment: occasionally   Drug use: No   Sexual activity: Not on file  Other Topics Concern   Not on file  Social History Narrative   Not on file   Social Determinants of Health   Financial Resource Strain: Not on file  Food Insecurity: Not on file  Transportation Needs: Not on file  Physical Activity: Not on file  Stress: Not on file  Social Connections: Not on file     Family History: The patient's family history includes Hypertension in her mother.  ROS:   Please see the history of present illness.    Numbness and tingling with burning in her feet.  Back stiffness each morning.  All other systems reviewed and are negative.  EKGs/Labs/Other Studies Reviewed:    The following studies were reviewed today:  2D Doppler echocardiogram 2019:  Study Conclusions   - Left ventricle: The cavity size was mildly dilated. Systolic    function was mildly to moderately reduced. The estimated ejection    fraction was in the range of 40% to 45%. Diffuse hypokinesis.    Moderate hypokinesis  of the inferior myocardium. Features are    consistent with a pseudonormal left ventricular filling pattern,    with concomitant abnormal relaxation and increased filling    pressure (grade 2 diastolic dysfunction).  - Mitral valve: There was mild regurgitation.  - Left atrium: The atrium was moderately dilated.  - Atrial septum: No defect or patent foramen ovale was identified.   Impressions:   - Reduced EF with global mild hypokinesis and moderate hypokinesis    inferiorly. Grade 2 diastolic dysfunction.   CATH 2017: Diagnostic Dominance: Right    Intervention     EKG:  EKG normal sinus rhythm, left axis deviation.  Poor R wave progression.  Compared to April 2021, the left axis is somewhat new.  Heart rate is slower.  Recent Labs: No  results found for requested labs within last 8760 hours.  Recent Lipid Panel    Component Value Date/Time   CHOL 153 01/16/2018 0318   TRIG 169 (H) 01/16/2018 0318   HDL 42 01/16/2018 0318   CHOLHDL 3.6 01/16/2018 0318   VLDL 34 01/16/2018 0318   LDLCALC 77 01/16/2018 0318    Physical Exam:    VS:  BP 140/70 (BP Location: Left Arm, Patient Position: Sitting, Cuff Size: Normal)   Pulse 80   Ht 5\' 5"  (1.651 m)   Wt 178 lb (80.7 kg)   SpO2 95%   BMI 29.62 kg/m     Wt Readings from Last 3 Encounters:  09/07/20 178 lb (80.7 kg)  01/13/20 181 lb (82.1 kg)  07/09/19 182 lb (82.6 kg)     GEN: Overweight. No acute distress HEENT: Normal NECK: No JVD. LYMPHATICS: No lymphadenopathy CARDIAC: No murmur. RRR no gallop, or edema. VASCULAR:  Normal Pulses. No bruits. RESPIRATORY:  Clear to auscultation without rales, wheezing or rhonchi  ABDOMEN: Soft, non-tender, non-distended, No pulsatile mass, MUSCULOSKELETAL: No deformity  SKIN: Warm and dry NEUROLOGIC:  Alert and oriented x 3 PSYCHIATRIC:  Normal affect   ASSESSMENT:    1. CAD S/P percutaneous coronary angioplasty   2. Chronic combined systolic and diastolic heart failure  ([place])   3. Hyperlipidemia with target LDL less than 70   4. Essential hypertension   5. DM (diabetes mellitus), type 2, uncontrolled with complications ([place])   6. Morbid obesity due to excess calories ([place])    PLAN:    In order of problems listed above:  Nitroglycerin was provided.  Instruction concerning secondary prevention is given. Continue current therapy including atenolol, Cozaar, and consider switching from Janumet to SGLT2 therapy.  Continue 2 drug therapy.  I think an SGLT2 makes a lot of sense in her for risk reduction.  We will run this through the primary care. Continue Crestor 10 mg/day.  The most recent LDL was 69 in 2021.  A recent blood test was done and results are not known.  If LDL is greater than 70, I would intensify rosuvastatin dose. Blood pressure target 130/80 mmHg.  Low-salt diet and exercise encouraged. Consider SGLT2 therapy Encouraged weight loss  Overall education and awareness concerning primary/secondary risk prevention was discussed in detail: LDL less than 70, hemoglobin A1c less than 7, blood pressure target less than 130/80 mmHg, >150 minutes of moderate aerobic activity per week, avoidance of smoking, weight control (via diet and exercise), and continued surveillance/management of/for obstructive sleep apnea.    Medication Adjustments/Labs and Tests Ordered: Current medicines are reviewed at length with the patient today.  Concerns regarding medicines are outlined above.  Orders Placed This Encounter  Procedures   EKG 12-Lead   No orders of the defined types were placed in this encounter.      Signed, Sinclair Grooms, MD  09/07/2020 9:39 AM    [facility]

## 2020-09-07 ENCOUNTER — Other Ambulatory Visit: Payer: Self-pay

## 2020-09-07 ENCOUNTER — Ambulatory Visit: Payer: Self-pay | Admitting: Interventional Cardiology

## 2020-09-07 ENCOUNTER — Other Ambulatory Visit: Payer: Self-pay | Admitting: Student

## 2020-09-07 ENCOUNTER — Encounter: Payer: Self-pay | Admitting: Interventional Cardiology

## 2020-09-07 VITALS — BP 140/70 | HR 80 | Ht 65.0 in | Wt 178.0 lb

## 2020-09-07 DIAGNOSIS — IMO0002 Reserved for concepts with insufficient information to code with codable children: Secondary | ICD-10-CM

## 2020-09-07 DIAGNOSIS — I5042 Chronic combined systolic (congestive) and diastolic (congestive) heart failure: Secondary | ICD-10-CM | POA: Diagnosis not present

## 2020-09-07 DIAGNOSIS — I2510 Atherosclerotic heart disease of native coronary artery without angina pectoris: Secondary | ICD-10-CM

## 2020-09-07 DIAGNOSIS — I10 Essential (primary) hypertension: Secondary | ICD-10-CM | POA: Diagnosis not present

## 2020-09-07 DIAGNOSIS — E118 Type 2 diabetes mellitus with unspecified complications: Secondary | ICD-10-CM

## 2020-09-07 DIAGNOSIS — E785 Hyperlipidemia, unspecified: Secondary | ICD-10-CM | POA: Diagnosis not present

## 2020-09-07 DIAGNOSIS — E1165 Type 2 diabetes mellitus with hyperglycemia: Secondary | ICD-10-CM

## 2020-09-07 DIAGNOSIS — Z9861 Coronary angioplasty status: Secondary | ICD-10-CM

## 2020-09-07 DIAGNOSIS — Z1231 Encounter for screening mammogram for malignant neoplasm of breast: Secondary | ICD-10-CM

## 2020-09-07 MED ORDER — NITROGLYCERIN 0.4 MG SL SUBL: 0.4000 mg | SUBLINGUAL_TABLET | SUBLINGUAL | 3 refills | Status: AC | PRN

## 2020-09-07 NOTE — Patient Instructions (Signed)
Medication Instructions:   A prescription has been sent in for Nitroglycerin.  If you have chest pain that doesn't relieve quickly, place one tablet under your tongue and allow it to dissolve.  If no relief after 5 minutes, you may take another pill.  If no relief after 5 minutes, you may take a 3rd dose but you need to call 911 and report to ER immediately.  *If you need a refill on your cardiac medications before your next appointment, please call your pharmacy*   Lab Work: None If you have labs (blood work) drawn today and your tests are completely normal, you will receive your results only by: [place] (if you have MyChart) OR A paper copy in the mail If you have any lab test that is abnormal or we need to change your treatment, we will call you to review the results.   Testing/Procedures: None   Follow-Up:  Dr. Tamala Julian recommends that you follow up with your Primary Care to discuss your recent lab work.   At [facility], you and your health needs are our priority.  As part of our continuing mission to provide you with exceptional heart care, we have created designated Provider Care Teams.  These Care Teams include your primary Cardiologist (physician) and Advanced Practice Providers (APPs -  Physician Assistants and Nurse Practitioners) who all work together to provide you with the care you need, when you need it.  We recommend signing up for the patient portal called "MyChart".  Sign up information is provided on this After Visit Summary.  MyChart is used to connect with patients for Virtual Visits (Telemedicine).  Patients are able to view lab/test results, encounter notes, upcoming appointments, etc.  Non-urgent messages can be sent to your provider as well.   To learn more about what you can do with MyChart, go to [email or web address].    Your next appointment:   9-12 month(s)  The format for your next appointment:   In Person  Provider:   You may see Sinclair Grooms, MD or one of the following Advanced Practice Providers on your designated Care Team:   Kathyrn Drown, NP   Other Instructions   Your provider recommends that you maintain 150 minutes per week of moderate aerobic activity.

## 2020-11-03 ENCOUNTER — Other Ambulatory Visit: Payer: Self-pay

## 2020-11-03 ENCOUNTER — Ambulatory Visit
Admission: RE | Admit: 2020-11-03 | Discharge: 2020-11-03 | Disposition: A | Discharge status: Home / Self Care | Payer: Medicare Other | Source: Ambulatory Visit | Attending: Student | Admitting: Student

## 2020-11-03 DIAGNOSIS — Z1231 Encounter for screening mammogram for malignant neoplasm of breast: Secondary | ICD-10-CM

## 2020-11-15 ENCOUNTER — Other Ambulatory Visit: Payer: Self-pay | Admitting: *Deleted

## 2020-11-17 MED ORDER — PANTOPRAZOLE SODIUM 40 MG PO TBEC: 40.0000 mg | DELAYED_RELEASE_TABLET | Freq: Every day | ORAL | 2 refills | Status: AC

## 2020-11-17 NOTE — Telephone Encounter (Signed)
Ok to fill 

## 2021-04-28 ENCOUNTER — Ambulatory Visit: Payer: Medicare Other | Admitting: Endocrinology

## 2021-06-22 ENCOUNTER — Ambulatory Visit: Payer: Medicare Other | Attending: Family

## 2021-06-22 DIAGNOSIS — Z23 Encounter for immunization: Secondary | ICD-10-CM

## 2021-06-22 NOTE — Progress Notes (Signed)
? ?  Covid-19 Vaccination Clinic ? ?Name:  Leah Norton    ?MRN: [id number] ?DOB: February 12, 1956 ? ?06/22/2021 ? ?Leah Norton was observed post Covid-19 immunization for 15 minutes without incident. She was provided with Vaccine Information Sheet and instruction to access the V-Safe system.  ? ?Leah Norton was instructed to call 911 with any severe reactions post vaccine: ?Difficulty breathing  ?Swelling of face and throat  ?A fast heartbeat  ?A bad rash all over body  ?Dizziness and weakness  ? ?Immunizations Administered   ? ? Name Date Dose VIS Date Route  ? Moderna Covid-19 vaccine Bivalent Booster 06/22/2021  9:30 AM 0.5 mL 10/22/2020 Intramuscular  ? Manufacturer: Moderna  ? Lot: [id number]  ? [place]: 32023-343-56  ? ?  ?  ?

## 2021-06-23 ENCOUNTER — Encounter: Payer: Self-pay | Admitting: Student

## 2021-06-26 ENCOUNTER — Encounter: Payer: Self-pay | Admitting: Student

## 2021-09-12 NOTE — Progress Notes (Signed)
Cardiology Office Note:    Date:  09/13/2021   ID:  Leah Norton, DOB 1956/02/02, MRN [id number]  PCP:  Cipriano Mile, NP  Cardiologist:  Sinclair Grooms, MD   Referring MD: Cipriano Mile, NP   Chief Complaint  Patient presents with   Coronary Artery Disease    History of Present Illness:    Leah Norton is a 66 y.o. female with a hx of diabetes mellitus II, hyperlipidemia, primary hypertension, tobacco abuse, nonobstructive coronary disease by angiography in 2005. Recent nuclear study demonstrated evidence of ischemia. Subsequent catheterization demonstrated 75-85% proximal LAD which was stented with DES. Brilinta caused dyspnea and has been discontinued with substitution of Plavix.  Last visit had exertional dyspnea and vague chest discomfort.  Left arm numbness and tingling for less than 30 minutes last week.  It was nonexertional in occurrence.  She denies chest discomfort.  Relatively sedentary.  No neurological complaints.  No chest discomfort.  Still smokes cigarettes.  Dr. Delfina Redwood is no longer her primary physician, and it sounds as though it was related to finances.  She is trying to find a primary care physician.  Last blood work was copied and will be scanned into the computer the most recent laboratory data is outlined below from April 2022.  Past Medical History:  Diagnosis Date   Anginal pain ([place])    Arthritis    "right anikle" (02/29/2016)   CAD in native artery 02/08/2016   Three-vessel coronary artery calcification on CT scan   Complication of anesthesia    "had reaction w/hives, rash, itching" (02/29/2016)   Constipation    Coronary artery disease    12/17 PCI with DESx1 to pLAD EF normal   Enlarged heart    GERD (gastroesophageal reflux disease)    High cholesterol    Hypertension    Migraine    hx   Stroke ([place])    Symptomatic anemia 07/10/2016   Type II diabetes mellitus ([place])     Past Surgical History:  Procedure Laterality Date    ABDOMINAL HYSTERECTOMY     CARDIAC CATHETERIZATION N/A 02/29/2016   Procedure: Left Heart Cath and Coronary Angiography;  Surgeon: Belva Crome, MD;  Location: [facility];  Service: Cardiovascular;  Laterality: N/A;   CARDIAC CATHETERIZATION N/A 02/29/2016   Procedure: Coronary Stent Intervention;  Surgeon: Belva Crome, MD;  Location: [place] [facility];  Service: Cardiovascular;  Laterality: N/A;   CARDIAC CATHETERIZATION  2005   "no blockages"   COLONOSCOPY WITH PROPOFOL N/A 03/15/2014   Procedure: COLONOSCOPY WITH PROPOFOL;  Surgeon: Garlan Fair, MD;  Location: [facility];  Service: Endoscopy;  Laterality: N/A;   CORONARY ANGIOPLASTY WITH STENT PLACEMENT     ESOPHAGOGASTRODUODENOSCOPY N/A 07/11/2016   Procedure: ESOPHAGOGASTRODUODENOSCOPY (EGD);  Surgeon: Laurence Spates, MD;  Location: [facility] ENDOSCOPY;  Service: Endoscopy;  Laterality: N/A;   FRACTURE SURGERY     MULTIPLE TOOTH EXTRACTIONS     teeth removed   ORIF ANKLE FRACTURE Right 11/12/2012   Procedure: OPEN REDUCTION INTERNAL FIXATION (ORIF) ANKLE FRACTURE;  Surgeon: Marianna Payment, MD;  Location: [facility];  Service: Orthopedics;  Laterality: Right;  Open reduction internal fixation ankle fracture, trimalleolar    Current Medications: Current Meds  Medication Sig   atenolol (TENORMIN) 50 MG tablet Take 1 tablet (50 mg total) by mouth daily.   chlorthalidone (HYGROTON) 25 MG tablet Take 25 mg by mouth every morning.   Cholecalciferol (VITAMIN D3) 1000 units CAPS Take  2,000 Units by mouth daily.   clopidogrel (PLAVIX) 75 MG tablet Take 1 tablet (75 mg total) by mouth daily. Please keep upcoming appointment in June 2022 for future refills. Thank you   cyclobenzaprine (FLEXERIL) 5 MG tablet Take 1 tablet (5 mg total) by mouth at bedtime.   furosemide (LASIX) 20 MG tablet TAKE 1 TABLET(20 MG) BY MOUTH DAILY AS NEEDED. Please make yearly appt with Dr. Tamala Julian for April 2022 for future refills. Thank you 1st attempt   losartan  (COZAAR) 100 MG tablet Take 1 tablet (100 mg total) by mouth daily.   Multiple Vitamin (MULTIVITAMIN) tablet Take 1 tablet by mouth daily. OTC   nitroGLYCERIN (NITROSTAT) 0.4 MG SL tablet Place 1 tablet (0.4 mg total) under the tongue every 5 (five) minutes as needed for chest pain.   pantoprazole (PROTONIX) 40 MG tablet Take 1 tablet (40 mg total) by mouth daily.   rosuvastatin (CRESTOR) 10 MG tablet Take 10 mg by mouth daily.   sitaGLIPtin-metformin (JANUMET) 50-1000 MG per tablet Take 1 tablet by mouth 2 (two) times daily with a meal.   Zinc Sulfate (ZINC 15 PO) Take by mouth.     Allergies:   Codeine, Erythromycin, Penicillins, Sulfa antibiotics, Sulfonamide derivatives, and Vicodin [hydrocodone-acetaminophen]   Social History   Socioeconomic History   Marital status: Single    Spouse name: Not on file   Number of children: Not on file   Years of education: Not on file   Highest education level: Not on file  Occupational History   Not on file  Tobacco Use   Smoking status: Former    Packs/day: 0.50    Years: 42.00    Total pack years: 21.00    Types: Cigarettes    Quit date: 01/15/2018    Years since quitting: 3.6   Smokeless tobacco: Never  Vaping Use   Vaping Use: Never used  Substance and Sexual Activity   Alcohol use: Yes    Comment: occasionally   Drug use: No   Sexual activity: Not on file  Other Topics Concern   Not on file  Social History Narrative   Not on file   Social Determinants of Health   Financial Resource Strain: Not on file  Food Insecurity: Not on file  Transportation Needs: Not on file  Physical Activity: Not on file  Stress: Not on file  Social Connections: Not on file     Family History: The patient's family history includes Hypertension in her mother.  ROS:   Please see the history of present illness.    Tobacco use.  Musculoskeletal discomfort.  All other systems reviewed and are negative.  EKGs/Labs/Other Studies Reviewed:     The following studies were reviewed today: Laboratory data July 06, 2020: Total cholesterol 298 Triglyceride 1083 HDL 39 Non-HDL cholesterol 259 Creatinine 0.77  EKG:  EKG normal sinus rhythm with left atrial abnormality.  Poor R wave progression V1 through V4.  Compared to September 07, 2020, No changes noted.  Recent Labs: No results found for requested labs within last 365 days.  Recent Lipid Panel    Component Value Date/Time   CHOL 153 01/16/2018 0318   TRIG 169 (H) 01/16/2018 0318   HDL 42 01/16/2018 0318   CHOLHDL 3.6 01/16/2018 0318   VLDL 34 01/16/2018 0318   LDLCALC 77 01/16/2018 0318    Physical Exam:    VS:  BP 128/72   Pulse 75   Ht '5\' 5"'$  (1.651 m)   Wt  172 lb 9.6 oz (78.3 kg)   SpO2 96%   BMI 28.72 kg/m     Wt Readings from Last 3 Encounters:  09/13/21 172 lb 9.6 oz (78.3 kg)  09/07/20 178 lb (80.7 kg)  01/13/20 181 lb (82.1 kg)     GEN: Mildly overweight. No acute distress HEENT: Normal NECK: No JVD. LYMPHATICS: No lymphadenopathy CARDIAC: No murmur. RRR no gallop, or edema. VASCULAR:  Normal Pulses. No bruits. RESPIRATORY:  Clear to auscultation without rales, wheezing or rhonchi  ABDOMEN: Soft, non-tender, non-distended, No pulsatile mass, MUSCULOSKELETAL: No deformity  SKIN: Warm and dry NEUROLOGIC:  Alert and oriented x 3 PSYCHIATRIC:  Normal affect   ASSESSMENT:    1. CAD S/P percutaneous coronary angioplasty   2. Chronic combined systolic and diastolic heart failure ([place])   3. Hyperlipidemia with target LDL less than 70   4. Essential hypertension   5. Morbid obesity due to excess calories ([place])   6. Diabetes mellitus screening    PLAN:    In order of problems listed above:  Secondary prevention discussed.  Smoking cessation encouraged.  Laboratory data will be obtained today. No volume overload on clinical exam.  No recent assessment of LV function. Severe hyperlipidemia on low-dose statin therapy in the form of rosuvastatin 10  mg/day. Blood pressure control is adequate on current therapy.  Lasix is being used on a as needed basis.  She does take Tenormin, chlorthalidone, on a daily basis along with Cozaar. Encourage physical activity. Hemoglobin A1c   Overall education and awareness concerning primary/secondary risk prevention was discussed in detail: LDL less than 70, hemoglobin A1c less than 7, blood pressure target less than 130/80 mmHg, >150 minutes of moderate aerobic activity per week, avoidance of smoking, weight control (via diet and exercise), and continued surveillance/management of/for obstructive sleep apnea.    Medication Adjustments/Labs and Tests Ordered: Current medicines are reviewed at length with the patient today.  Concerns regarding medicines are outlined above.  Orders Placed This Encounter  Procedures   Hemoglobin A1c   Comprehensive metabolic panel   Lipid panel   EKG 12-Lead   No orders of the defined types were placed in this encounter.   Patient Instructions  Medication Instructions:  Your physician recommends that you continue on your current medications as directed. Please refer to the Current Medication list given to you today.  *If you need a refill on your cardiac medications before your next appointment, please call your pharmacy*  Lab Work: TODAY: Hemoglobin A1c, Lipid panel, CMET If you have labs (blood work) drawn today and your tests are completely normal, you will receive your results only by: MyChart Message (if you have MyChart) OR A paper copy in the mail If you have any lab test that is abnormal or we need to change your treatment, we will call you to review the results.  Testing/Procedures: NONE  Follow-Up: At [facility], you and your health needs are our priority.  As part of our continuing mission to provide you with exceptional heart care, we have created designated Provider Care Teams.  These Care Teams include your primary Cardiologist (physician)  and Advanced Practice Providers (APPs -  Physician Assistants and Nurse Practitioners) who all work together to provide you with the care you need, when you need it.  Your next appointment:   1 year(s)  The format for your next appointment:   In Person  Provider:   Sinclair Grooms, MD {   Important Information About  [place]         Signed, Sinclair Grooms, MD  09/13/2021 8:52 AM    [facility]

## 2021-09-13 ENCOUNTER — Ambulatory Visit: Payer: Medicare Other | Admitting: Interventional Cardiology

## 2021-09-13 ENCOUNTER — Encounter: Payer: Self-pay | Admitting: Interventional Cardiology

## 2021-09-13 VITALS — BP 128/72 | HR 75 | Ht 65.0 in | Wt 172.6 lb

## 2021-09-13 DIAGNOSIS — Z9861 Coronary angioplasty status: Secondary | ICD-10-CM

## 2021-09-13 DIAGNOSIS — E785 Hyperlipidemia, unspecified: Secondary | ICD-10-CM

## 2021-09-13 DIAGNOSIS — I2510 Atherosclerotic heart disease of native coronary artery without angina pectoris: Secondary | ICD-10-CM

## 2021-09-13 DIAGNOSIS — I10 Essential (primary) hypertension: Secondary | ICD-10-CM | POA: Diagnosis not present

## 2021-09-13 DIAGNOSIS — Z131 Encounter for screening for diabetes mellitus: Secondary | ICD-10-CM

## 2021-09-13 DIAGNOSIS — I5042 Chronic combined systolic (congestive) and diastolic (congestive) heart failure: Secondary | ICD-10-CM

## 2021-09-13 NOTE — Patient Instructions (Signed)
Medication Instructions:  Your physician recommends that you continue on your current medications as directed. Please refer to the Current Medication list given to you today.  *If you need a refill on your cardiac medications before your next appointment, please call your pharmacy*  Lab Work: TODAY: Hemoglobin A1c, Lipid panel, CMET If you have labs (blood work) drawn today and your tests are completely normal, you will receive your results only by: MyChart Message (if you have MyChart) OR A paper copy in the mail If you have any lab test that is abnormal or we need to change your treatment, we will call you to review the results.  Testing/Procedures: NONE  Follow-Up: At [facility], you and your health needs are our priority.  As part of our continuing mission to provide you with exceptional heart care, we have created designated Provider Care Teams.  These Care Teams include your primary Cardiologist (physician) and Advanced Practice Providers (APPs -  Physician Assistants and Nurse Practitioners) who all work together to provide you with the care you need, when you need it.  Your next appointment:   1 year(s)  The format for your next appointment:   In Person  Provider:   Sinclair Grooms, MD {   Important Information About Sugar

## 2021-09-14 LAB — LIPID PANEL
Chol/HDL Ratio: 4.3 ratio (ref 0.0–4.4)
Cholesterol, Total: 156 mg/dL (ref 100–199)
HDL: 36 mg/dL — ABNORMAL LOW (ref >39)
LDL Chol Calc (NIH): 74 mg/dL (ref 0–99)
Triglycerides: 281 mg/dL — ABNORMAL HIGH (ref 0–149)
VLDL Cholesterol Cal: 46 mg/dL — ABNORMAL HIGH (ref 5–40)

## 2021-09-14 LAB — COMPREHENSIVE METABOLIC PANEL
ALT: 20 IU/L (ref 0–32)
AST: 18 IU/L (ref 0–40)
Albumin/Globulin Ratio: 1.4 (ref 1.2–2.2)
Albumin: 4.6 g/dL (ref 3.8–4.8)
Alkaline Phosphatase: 87 IU/L (ref 44–121)
BUN/Creatinine Ratio: 22 (ref 12–28)
BUN: 20 mg/dL (ref 8–27)
Bilirubin Total: <0.2 mg/dL (ref 0.0–1.2)
CO2: 28 mmol/L (ref 20–29)
Calcium: 10.2 mg/dL (ref 8.7–10.3)
Chloride: 96 mmol/L (ref 96–106)
Creatinine, Ser: 0.91 mg/dL (ref 0.57–1.00)
Globulin, Total: 3.2 g/dL (ref 1.5–4.5)
Glucose: 172 mg/dL — ABNORMAL HIGH (ref 70–99)
Potassium: 3.9 mmol/L (ref 3.5–5.2)
Sodium: 137 mmol/L (ref 134–144)
Total Protein: 7.8 g/dL (ref 6.0–8.5)
eGFR: 70 mL/min/{1.73_m2} (ref >59)

## 2021-09-14 LAB — HEMOGLOBIN A1C
Est. average glucose Bld gHb Est-mCnc: 180 mg/dL
Hgb A1c MFr Bld: 7.9 % — ABNORMAL HIGH (ref 4.8–5.6)

## 2021-11-27 ENCOUNTER — Ambulatory Visit: Payer: Medicare Other | Admitting: Podiatry

## 2021-11-29 ENCOUNTER — Encounter: Payer: Self-pay | Admitting: Podiatry

## 2021-11-29 ENCOUNTER — Ambulatory Visit (INDEPENDENT_AMBULATORY_CARE_PROVIDER_SITE_OTHER): Payer: Medicare Other | Admitting: Podiatry

## 2021-11-29 ENCOUNTER — Ambulatory Visit (INDEPENDENT_AMBULATORY_CARE_PROVIDER_SITE_OTHER): Payer: Medicare Other

## 2021-11-29 DIAGNOSIS — M25571 Pain in right ankle and joints of right foot: Secondary | ICD-10-CM | POA: Diagnosis not present

## 2021-11-29 DIAGNOSIS — M779 Enthesopathy, unspecified: Secondary | ICD-10-CM

## 2021-11-29 DIAGNOSIS — M7751 Other enthesopathy of right foot: Secondary | ICD-10-CM

## 2021-11-29 MED ORDER — TRIAMCINOLONE ACETONIDE 10 MG/ML IJ SUSP
10.0000 mg | Freq: Once | INTRAMUSCULAR | Status: AC
  Administered 2021-11-29: 10 mg

## 2021-11-29 NOTE — Progress Notes (Signed)
Subjective:   Patient ID: Leah Norton, female   DOB: 66 y.o.   MRN: [id number]   HPI Patient states her ankle has been hurting more recently and she had surgery on it approximately 10 years ago and its been giving her trouble neuro   ROS      Objective:  Physical Exam  Vascular status intact with diminished ankle movement right with history of fracture occurring 9 years ago with inflammation pain in the ankle     Assessment:  Chronic ankle pain secondary to previous injury with arthritis occurring an inflammatory capsulitis     Plan:  H&P x-rays reviewed condition discussed.  I went ahead today did sterile prep injected the ankle 3 mg Kenalog 5 mg Xylocaine into the ankle joint and discussed if this helps her hopefully we can avoid surgery but it may be necessary long-term.  X-rays indicate severe arthritis of the ankle joint right with fixation in place tibia-fibula

## 2021-12-06 ENCOUNTER — Other Ambulatory Visit: Payer: Self-pay | Admitting: Podiatry

## 2021-12-06 DIAGNOSIS — M779 Enthesopathy, unspecified: Secondary | ICD-10-CM

## 2021-12-08 ENCOUNTER — Telehealth: Payer: Self-pay

## 2021-12-08 NOTE — Telephone Encounter (Signed)
Please talk to Leah Norton about calling her

## 2022-01-02 ENCOUNTER — Telehealth: Payer: Self-pay | Admitting: *Deleted

## 2022-01-02 NOTE — Telephone Encounter (Signed)
Patient is calling for status of her orthotics, explained that usually takes 4-6 weeks for them to come in and someone will contact her. She verbalized understanding but still wanted to speak to someone in that department, transferred.

## 2022-01-30 ENCOUNTER — Encounter: Payer: Self-pay | Admitting: Nurse Practitioner

## 2022-02-05 ENCOUNTER — Ambulatory Visit (HOSPITAL_COMMUNITY)
Admission: EM | Admit: 2022-02-05 | Discharge: 2022-02-05 | Disposition: A | Discharge status: Home / Self Care | Payer: Medicare Other | Attending: Emergency Medicine | Admitting: Emergency Medicine

## 2022-02-05 ENCOUNTER — Encounter (HOSPITAL_COMMUNITY): Payer: Self-pay

## 2022-02-05 DIAGNOSIS — M7662 Achilles tendinitis, left leg: Secondary | ICD-10-CM | POA: Diagnosis not present

## 2022-02-05 MED ORDER — ACETAMINOPHEN 325 MG PO TABS
ORAL_TABLET | ORAL | Status: AC
  Filled 2022-02-05: qty 2

## 2022-02-05 MED ORDER — IBUPROFEN 600 MG PO TABS: 600.0000 mg | ORAL_TABLET | Freq: Four times a day (QID) | ORAL | 0 refills | Status: AC | PRN

## 2022-02-05 MED ORDER — ACETAMINOPHEN 325 MG PO TABS
650.0000 mg | ORAL_TABLET | Freq: Once | ORAL | Status: AC
Start: 2022-02-05 — End: 2022-02-05
  Administered 2022-02-05: 650 mg via ORAL

## 2022-02-05 NOTE — ED Triage Notes (Signed)
Pt c/o pain to the heal of lt foot x1wk. States soaking and using bio freeze with no relief. Denies injury.

## 2022-02-05 NOTE — Discharge Instructions (Addendum)
You can take ibuprofen 600 mg every 6 hours for pain and inflammation control. Alternate with tylenol as needed.  Apply ice to the area, 20 minutes at a time, 3 times daily. Elevate the lower extremity while icing.  Please follow up with the foot specialists.

## 2022-02-05 NOTE — ED Provider Notes (Signed)
[facility]    CSN: [id number] Arrival date & time: 02/05/22  1108     History   Chief Complaint Chief Complaint  Patient presents with   Foot Pain    HPI Leah Norton is a 66 y.o. female.  Presents with 1 week history of left heel pain Reports "behind" the heel  Tender to touch and walk Denies any trauma or injury. No history of injury in this foot  Has tried soaking and biofreeze Has not taken any medications   Past Medical History:  Diagnosis Date   Anginal pain ([place])    Arthritis    "right anikle" (02/29/2016)   CAD in native artery 02/08/2016   Three-vessel coronary artery calcification on CT scan   Complication of anesthesia    "had reaction w/hives, rash, itching" (02/29/2016)   Constipation    Coronary artery disease    12/17 PCI with DESx1 to pLAD EF normal   Enlarged heart    GERD (gastroesophageal reflux disease)    High cholesterol    Hypertension    Migraine    hx   Stroke ([place])    Symptomatic anemia 07/10/2016   Type II diabetes mellitus [facility])     Patient Active Problem List   Diagnosis Date Noted   Mechanical complication associated with orthopedic device ([place]) 01/14/2019   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   Acute ischemic stroke ([place]) 01/16/2018   Acute CVA (cerebrovascular accident) ([place]) 01/16/2018   Referred otalgia of left ear 12/26/2017   GI bleed 07/10/2016   Morbid obesity due to excess calories ([place]) 05/14/2016   Dyspnea on exertion 04/29/2016   Abnormal nuclear stress test    CAD S/P percutaneous coronary angioplasty 02/08/2016   INSOMNIA, CHRONIC 02/28/2010   HEADACHE 02/28/2010   HEEL PAIN, RIGHT 11/29/2009   COUGH DUE TO ACE INHIBITORS 11/29/2009   ATRIAL ENLARGEMENT, LEFT 03/21/2009   ANEMIA 12/28/2008   CARDIAC MURMUR 12/28/2008   OTHER NONSPECIFIC ABNORMAL SERUM ENZYME LEVELS 12/28/2008   CERVICAL MUSCLE STRAIN 12/28/2008   CONSTIPATION 06/01/2008   POSTMENOPAUSAL STATUS 05/15/2008    BRONCHITIS, CHRONIC 12/18/2007   Cigarette smoker 08/19/2007   DEGENERATIVE DISC DISEASE, LUMBOSACRAL SPINE 08/19/2007   LOW BACK PAIN SYNDROME 08/19/2007   UPPER RESPIRATORY INFECTION, VIRAL 03/05/2007   NEOPLASM, SKIN, UNCERTAIN BEHAVIOR 86/76/1950   GANGLION CYST, WRIST, LEFT 01/28/2007   DENTAL PAIN 01/19/2007   DM (diabetes mellitus), type 2, uncontrolled with complications 93/26/7124   Hyperlipidemia with target LDL less than 70 12/10/2006   Essential hypertension 12/10/2006   RHINITIS, ALLERGIC NEC 12/10/2006   HIP PAIN, RIGHT 12/10/2006    Past Surgical History:  Procedure Laterality Date   ABDOMINAL HYSTERECTOMY     CARDIAC CATHETERIZATION N/A 02/29/2016   Procedure: Left Heart Cath and Coronary Angiography;  Surgeon: Belva Crome, MD;  Location: [facility];  Service: Cardiovascular;  Laterality: N/A;   CARDIAC CATHETERIZATION N/A 02/29/2016   Procedure: Coronary Stent Intervention;  Surgeon: Belva Crome, MD;  Location: [facility];  Service: Cardiovascular;  Laterality: N/A;   CARDIAC CATHETERIZATION  2005   "no blockages"   COLONOSCOPY WITH PROPOFOL N/A 03/15/2014   Procedure: COLONOSCOPY WITH PROPOFOL;  Surgeon: Garlan Fair, MD;  Location: [facility];  Service: Endoscopy;  Laterality: N/A;   CORONARY ANGIOPLASTY WITH STENT PLACEMENT     ESOPHAGOGASTRODUODENOSCOPY N/A 07/11/2016   Procedure: ESOPHAGOGASTRODUODENOSCOPY (EGD);  Surgeon: Laurence Spates, MD;  Location: [facility];  Service: Endoscopy;  Laterality: N/A;  FRACTURE SURGERY     MULTIPLE TOOTH EXTRACTIONS     teeth removed   ORIF ANKLE FRACTURE Right 11/12/2012   Procedure: OPEN REDUCTION INTERNAL FIXATION (ORIF) ANKLE FRACTURE;  Surgeon: Marianna Payment, MD;  Location: [facility];  Service: Orthopedics;  Laterality: Right;  Open reduction internal fixation ankle fracture, trimalleolar    OB History   No obstetric history on file.      Home Medications    Prior to Admission medications    Medication Sig Start Date End Date Taking? Authorizing Provider  ibuprofen (ADVIL) 600 MG tablet Take 1 tablet (600 mg total) by mouth every 6 (six) hours as needed. 02/05/22  Yes Dailey Alberson, Wells Guiles, PA-C  atenolol (TENORMIN) 50 MG tablet Take 1 tablet (50 mg total) by mouth daily. 02/18/18 09/13/21  Burtis Junes, NP  chlorthalidone (HYGROTON) 25 MG tablet Take 25 mg by mouth every morning. 04/14/19   [provider]  Cholecalciferol (VITAMIN D3) 1000 units CAPS Take 2,000 Units by mouth daily.    [provider]  clopidogrel (PLAVIX) 75 MG tablet Take 1 tablet (75 mg total) by mouth daily. Please keep upcoming appointment in June 2022 for future refills. Thank you 08/29/20   Belva Crome, MD  cyclobenzaprine (FLEXERIL) 5 MG tablet Take 1 tablet (5 mg total) by mouth at bedtime. 05/28/18   Frann Rider, NP  furosemide (LASIX) 20 MG tablet TAKE 1 TABLET(20 MG) BY MOUTH DAILY AS NEEDED. Please make yearly appt with Dr. Tamala Julian for April 2022 for future refills. Thank you 1st attempt 04/13/20   Belva Crome, MD  losartan (COZAAR) 100 MG tablet Take 1 tablet (100 mg total) by mouth daily. 02/18/18 09/13/21  Burtis Junes, NP  Multiple Vitamin (MULTIVITAMIN) tablet Take 1 tablet by mouth daily. OTC    [provider]  nitroGLYCERIN (NITROSTAT) 0.4 MG SL tablet Place 1 tablet (0.4 mg total) under the tongue every 5 (five) minutes as needed for chest pain. 09/07/20   Belva Crome, MD  pantoprazole (PROTONIX) 40 MG tablet Take 1 tablet (40 mg total) by mouth daily. 11/17/20   Belva Crome, MD  rosuvastatin (CRESTOR) 10 MG tablet Take 10 mg by mouth daily.    [provider]  sitaGLIPtin-metformin (JANUMET) 50-1000 MG per tablet Take 1 tablet by mouth 2 (two) times daily with a meal.    [provider]  Zinc Sulfate (ZINC 15 PO) Take by mouth.    [provider]    Family History Family History  Problem Relation Age of Onset   Hypertension Mother      Social History Social History   Tobacco Use   Smoking status: Former    Packs/day: 0.50    Years: 42.00    Total pack years: 21.00    Types: Cigarettes    Quit date: 01/15/2018    Years since quitting: 4.0   Smokeless tobacco: Never  Vaping Use   Vaping Use: Never used  Substance Use Topics   Alcohol use: Yes    Comment: occasionally   Drug use: No     Allergies   Codeine, Erythromycin, Penicillins, Sulfa antibiotics, Sulfonamide derivatives, and Vicodin [hydrocodone-acetaminophen]   Review of Systems Review of Systems Per HPI  Physical Exam Triage Vital Signs ED Triage Vitals  Enc Vitals Group     BP 02/05/22 1329 (!) 179/95     Pulse Rate 02/05/22 1329 88     Resp 02/05/22 1329 18  Temp 02/05/22 1329 98.9 F (37.2 C)     Temp Source 02/05/22 1329 Oral     SpO2 02/05/22 1329 96 %     Weight --      Height --      Head Circumference --      Peak Flow --      Pain Score 02/05/22 1330 9     Pain Loc --      Pain Edu? --      Excl. in [place]? --    No data found.  Updated Vital Signs BP (!) 179/95 (BP Location: Left Arm)   Pulse 88   Temp 98.9 F (37.2 C) (Oral)   Resp 18   SpO2 96%   Physical Exam Vitals and nursing note reviewed.  Constitutional:      General: She is not in acute distress. HENT:     Mouth/Throat:     Pharynx: Oropharynx is clear.  Cardiovascular:     Rate and Rhythm: Normal rate and regular rhythm.     Pulses: Normal pulses.          Dorsalis pedis pulses are 2+ on the left side.  Pulmonary:     Effort: Pulmonary effort is normal.  Musculoskeletal:        General: Tenderness present. No swelling or deformity. Normal range of motion.     Left foot: Normal range of motion. No foot drop.       Feet:  Feet:     Left foot:     Skin integrity: Skin integrity normal. No erythema or warmth.     Comments: Negative thompson test. Full ROM at bilat ankles. No palpable rupture or deformity present. No bruising or erythema. Tender  along area marked.  Skin:    General: Skin is warm and dry.     Capillary Refill: Capillary refill takes less than 2 seconds.     Findings: No bruising or rash.  Neurological:     Mental Status: She is alert and oriented to person, place, and time.     UC Treatments / Results  Labs (all labs ordered are listed, but only abnormal results are displayed) Labs Reviewed - No data to display  EKG  Radiology No results found.  Procedures Procedures   Medications Ordered in UC Medications  acetaminophen (TYLENOL) tablet 650 mg (650 mg Oral Given 02/05/22 1400)    Initial Impression / Assessment and Plan / UC Course  I have reviewed the triage vital signs and the nursing notes.  Pertinent labs & imaging results that were available during my care of the patient were reviewed by me and considered in my medical decision making (see chart for details).  Achilles tendinitis, without signs of rupture Tylenol dose given in clinic. Discussed use of ibuprofen as antiinflammatory, ice, avoiding aggravating activity. Recommend follow with foot specialists as needed. She has seen [facility] past. Return precautions discussed. Patient agrees to plan  Final Clinical Impressions(s) / UC Diagnoses   Final diagnoses:  Achilles tendinitis of left lower extremity     Discharge Instructions      You can take ibuprofen 600 mg every 6 hours for pain and inflammation control. Alternate with tylenol as needed.  Apply ice to the area, 20 minutes at a time, 3 times daily. Elevate the lower extremity while icing.  Please follow up with the foot specialists.     ED Prescriptions     Medication Sig Dispense Auth. Provider  ibuprofen (ADVIL) 600 MG tablet Take 1 tablet (600 mg total) by mouth every 6 (six) hours as needed. 30 tablet Lizzie An, Wells Guiles, PA-C      PDMP not reviewed this encounter.   Farhiya Rosten, Vernice Jefferson 02/05/22 1434

## 2022-02-08 ENCOUNTER — Ambulatory Visit (INDEPENDENT_AMBULATORY_CARE_PROVIDER_SITE_OTHER): Payer: Medicare Other | Admitting: Podiatry

## 2022-02-08 ENCOUNTER — Telehealth: Payer: Self-pay | Admitting: Podiatry

## 2022-02-08 DIAGNOSIS — M7752 Other enthesopathy of left foot: Secondary | ICD-10-CM | POA: Diagnosis not present

## 2022-02-08 NOTE — Telephone Encounter (Signed)
Patient would like to know if she can get some "PRN Pain medication due to her pain not going away." If you could reach out and discuss that with her.

## 2022-02-08 NOTE — Telephone Encounter (Signed)
Patient wants to know before you create her orthotics if the insurance is going to cover it to determine is she wants to get them or not. Would like for you to contact her after hearing from the insurance company.

## 2022-02-09 NOTE — Progress Notes (Signed)
Subjective:   Patient ID: Leah Norton, female   DOB: 66 y.o.   MRN: [id number]   HPI Patient presents with pain in the left ankle states she is concerned she may have done something to it states the right is been pretty stable   ROS      Objective:  Physical Exam  Neurovascular status intact muscle strength adequate range of motion adequate with patient's left ankle found to be inflamed of a mild nature and it seems to be improving on its own currently     Assessment:  Inflammatory capsulitis left localized to the area but mild in intensity with the right doing well     Plan:  Reviewed condition that she may have walked abnormally but it is improving some good I will defer on treatment today but it is possible we will need to do so in the future.  Patient will be seen back to recheck encouraged to call if symptoms either do not improve or get worse  X-rays were negative for signs of fracture or any kind of arthritic issue with condition

## 2022-02-09 NOTE — Telephone Encounter (Signed)
Can take tylenol or ibuprophen

## 2022-03-14 ENCOUNTER — Ambulatory Visit: Payer: Medicare Other | Admitting: Nurse Practitioner

## 2022-03-23 ENCOUNTER — Other Ambulatory Visit: Payer: Medicare Other

## 2022-03-23 ENCOUNTER — Ambulatory Visit (INDEPENDENT_AMBULATORY_CARE_PROVIDER_SITE_OTHER): Payer: Medicare Other

## 2022-03-23 DIAGNOSIS — M7751 Other enthesopathy of right foot: Secondary | ICD-10-CM

## 2022-03-23 DIAGNOSIS — M7752 Other enthesopathy of left foot: Secondary | ICD-10-CM

## 2022-03-23 NOTE — Progress Notes (Signed)
Patient presents today to pick up custom molded foot orthotics recommended by Dr. Paulla Dolly.   Orthotics were dispensed and fit was satisfactory. Reviewed instructions for break-in and wear. Written instructions given to patient.  Patient will follow up as needed.   [facility] - order # [id number]

## 2022-03-28 ENCOUNTER — Ambulatory Visit (INDEPENDENT_AMBULATORY_CARE_PROVIDER_SITE_OTHER): Payer: Medicare Other | Admitting: Podiatry

## 2022-03-28 DIAGNOSIS — M7751 Other enthesopathy of right foot: Secondary | ICD-10-CM | POA: Diagnosis not present

## 2022-03-28 DIAGNOSIS — M7752 Other enthesopathy of left foot: Secondary | ICD-10-CM | POA: Diagnosis not present

## 2022-03-28 MED ORDER — IBUPROFEN 600 MG PO TABS: 600.0000 mg | ORAL_TABLET | Freq: Three times a day (TID) | ORAL | 0 refills | Status: DC | PRN

## 2022-03-28 NOTE — Progress Notes (Signed)
Subjective:   Patient ID: Leah Norton, female   DOB: 67 y.o.   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  Reduced and discussed all activities and discussed possible future actions which may be necessary

## 2022-06-17 ENCOUNTER — Ambulatory Visit (HOSPITAL_COMMUNITY)
Admission: EM | Admit: 2022-06-17 | Discharge: 2022-06-17 | Disposition: A | Discharge status: Home / Self Care | Payer: 59

## 2022-06-17 ENCOUNTER — Encounter (HOSPITAL_COMMUNITY): Payer: Self-pay

## 2022-06-17 DIAGNOSIS — M25511 Pain in right shoulder: Secondary | ICD-10-CM

## 2022-06-17 MED ORDER — IBUPROFEN 600 MG PO TABS: 600.0000 mg | ORAL_TABLET | Freq: Three times a day (TID) | ORAL | 0 refills | Status: DC | PRN

## 2022-06-17 MED ORDER — TIZANIDINE HCL 4 MG PO TABS: 4.0000 mg | ORAL_TABLET | Freq: Four times a day (QID) | ORAL | 0 refills | Status: DC | PRN

## 2022-06-17 NOTE — Discharge Instructions (Signed)
Advised to take ibuprofen 600 mg every 8 hours with food to help relieve pain and discomfort. Advised to take Zanaflex 4 mg every 8 hours as needed for muscle spasm relief.  Advised follow-up PCP return to urgent care as needed.
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
## 2022-06-18 ENCOUNTER — Other Ambulatory Visit: Payer: Self-pay | Admitting: Family

## 2022-06-18 DIAGNOSIS — F17200 Nicotine dependence, unspecified, uncomplicated: Secondary | ICD-10-CM

## 2022-07-03 ENCOUNTER — Ambulatory Visit: Payer: 59 | Admitting: Nurse Practitioner

## 2022-07-09 ENCOUNTER — Ambulatory Visit: Payer: 59 | Admitting: Family Medicine

## 2022-07-23 ENCOUNTER — Ambulatory Visit
Admission: RE | Admit: 2022-07-23 | Discharge: 2022-07-23 | Disposition: A | Discharge status: Home / Self Care | Payer: 59 | Source: Ambulatory Visit | Attending: Family | Admitting: Family

## 2022-07-23 DIAGNOSIS — F17200 Nicotine dependence, unspecified, uncomplicated: Secondary | ICD-10-CM

## 2022-09-29 LAB — AMB RESULTS CONSOLE CBG: Glucose: 234

## 2022-09-29 NOTE — Progress Notes (Signed)
Pt declined SDOH questions. Pt declined giving me PCP name. Pt reommended to tslk to her PCP about hypertension.

## 2022-10-16 ENCOUNTER — Encounter: Payer: Self-pay | Admitting: *Deleted

## 2022-10-16 NOTE — Progress Notes (Unsigned)
Pt attended 09/29/22 screening event where her /bp was 147/78 and her blood sugar was 234. At the event, the pt declined to share her PCP name and to answer the SDOH questionnaire. The event RN asked pt to please share her event results with her PCP. Chart review indicates pt has had a PCP, Sammuel Cooper, NP in Dec. 2023; however she has also had appt with PCP in other clinics since that time and cancelled them, so calls made to pt to offer support if needed r/t establishing a PCP, if she does not already have one.

## 2022-11-13 ENCOUNTER — Telehealth: Payer: Self-pay

## 2022-11-13 ENCOUNTER — Encounter (HOSPITAL_BASED_OUTPATIENT_CLINIC_OR_DEPARTMENT_OTHER): Payer: Self-pay | Admitting: Cardiovascular Disease

## 2022-11-13 ENCOUNTER — Ambulatory Visit (INDEPENDENT_AMBULATORY_CARE_PROVIDER_SITE_OTHER): Payer: 59 | Admitting: Cardiovascular Disease

## 2022-11-13 VITALS — BP 162/86 | HR 78 | Ht 65.0 in | Wt 174.0 lb

## 2022-11-13 DIAGNOSIS — I10 Essential (primary) hypertension: Secondary | ICD-10-CM | POA: Diagnosis not present

## 2022-11-13 DIAGNOSIS — F17200 Nicotine dependence, unspecified, uncomplicated: Secondary | ICD-10-CM

## 2022-11-13 DIAGNOSIS — E785 Hyperlipidemia, unspecified: Secondary | ICD-10-CM

## 2022-11-13 DIAGNOSIS — I639 Cerebral infarction, unspecified: Secondary | ICD-10-CM | POA: Diagnosis not present

## 2022-11-13 DIAGNOSIS — I2510 Atherosclerotic heart disease of native coronary artery without angina pectoris: Secondary | ICD-10-CM | POA: Diagnosis not present

## 2022-11-13 DIAGNOSIS — R079 Chest pain, unspecified: Secondary | ICD-10-CM

## 2022-11-13 DIAGNOSIS — Z0000 Encounter for general adult medical examination without abnormal findings: Secondary | ICD-10-CM

## 2022-11-13 DIAGNOSIS — Z9861 Coronary angioplasty status: Secondary | ICD-10-CM

## 2022-11-13 MED ORDER — NICOTINE 7 MG/24HR TD PT24: 7.0000 mg | MEDICATED_PATCH | Freq: Every day | TRANSDERMAL | 0 refills | Status: AC

## 2022-11-13 MED ORDER — AMLODIPINE BESYLATE 5 MG PO TABS: 5.0000 mg | ORAL_TABLET | Freq: Every day | ORAL | 3 refills | Status: DC

## 2022-11-13 MED ORDER — NICOTINE 14 MG/24HR TD PT24: 14.0000 mg | MEDICATED_PATCH | Freq: Every day | TRANSDERMAL | 0 refills | Status: AC

## 2022-11-13 MED ORDER — NICOTINE 21 MG/24HR TD PT24: 21.0000 mg | MEDICATED_PATCH | Freq: Every day | TRANSDERMAL | 0 refills | Status: AC

## 2022-11-13 NOTE — Patient Instructions (Addendum)
Medication Instructions:  START AMLODIPINE 5 MG DAILY   USE NICOTINE PATCH 21 MG DAILY FOR 6 WEEKS, THEN DECREASE TO 14 MG DAILY FOR 2 WEEKS, AND THEN 7 MG DAILY FOR 2 WEEKS   *If you need a refill on your cardiac medications before your next appointment, please call your pharmacy*  Lab Work: FASTING LP/CMET SOON   If you have labs (blood work) drawn today and your tests are completely normal, you will receive your results only by: MyChart Message (if you have MyChart) OR A paper copy in the mail If you have any lab test that is abnormal or we need to change your treatment, we will call you to review the results.  Testing/Procedures: Your physician has requested that you have a lexiscan myoview. For further information please visit [email or web address] Please follow instruction sheet, as given. THE OFFICE WILL CALL YOU TO SCHEDULE   Follow-Up: 12/26/2022 2:45 PM WITH DR [place]   Other Instructions AMY WILL BE IN TOUCH WITH YOUR SOON REGARDING SMOKING
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
## 2022-11-13 NOTE — Progress Notes (Deleted)
  Cardiology Office Note:  .   Date:  11/13/2022  ID:  Leah Norton, DOB 1955/09/03, MRN [id number] PCP: Leah Crofts, FNP  [facility] Providers Cardiologist:  Leah Noe, MD (Inactive) { Click to update primary MD,subspecialty MD or APP then REFRESH:1}   History of Present Illness: .   Leah Norton is a 67 y.o. 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  She was previously a patient of Dr. Katrinka Norton.  She had a prior cardiac catheterization 2005 that revealed nonobstructive disease.  Nuclear stress test in 2017 revealed LVEF 49% with anterior, septal, and apical infarct with peri-infarct ischemia.  She subsequently underwent left heart cath and was found to have 70-80% proximal LAD stenosis, 50% mid LAD stenosis, 70% ramus intermedius, and 65% left circumflex disease.  She underwent successful PCI of the proximal LAD.  She was initially started on ticagrelor but transition to clopidogrel due to dyspnea.  Her most recent echo 01/2018 revealed LVEF 40-45% with diffuse hypokinesis and moderate hypokinesis of the inferior myocardium.  She had grade 2 diastolic dysfunction.  She last saw Dr. Katrinka Norton 09/2021 and was feeling well.  She continued to smoke.  Triglycerides  ROS: ***  Studies Reviewed: .        *** Risk Assessment/Calculations:   {Does this patient have ATRIAL FIBRILLATION?:[phone]} No BP recorded.  {Refresh Note OR Click here to enter BP  :1}***       Physical Exam:   VS:  There were no vitals taken for this visit.   Wt Readings from Last 3 Encounters:  09/13/21 172 lb 9.6 oz (78.3 kg)  09/07/20 178 lb (80.7 kg)  01/13/20 181 lb (82.1 kg)    GEN: Well nourished, well developed in no acute distress NECK: No JVD; No carotid bruits CARDIAC: ***RRR, no murmurs, rubs, gallops RESPIRATORY:  Clear to auscultation without rales, wheezing or rhonchi  ABDOMEN: Soft, non-tender,  non-distended EXTREMITIES:  No edema; No deformity   ASSESSMENT AND PLAN: .   ***    {Are you ordering a CV Procedure (e.g. stress test, cath, DCCV, TEE, etc)?   Press F2        :[phone]}  Dispo: ***  Signed, Chilton Si, MD

## 2022-11-13 NOTE — Telephone Encounter (Addendum)
Called patient twice per health coaching referral for smoking cessation from Dr. Duke Salvia. Patient did not answer. Unable to leave message for patient to return call.   Renaee Munda, MS, ERHD, [facility]  Care Guide, Health & Wellness Coach [address], [address] [place] [address] Telephone: [phone] Email: [email or web address]

## 2022-11-14 ENCOUNTER — Encounter (HOSPITAL_BASED_OUTPATIENT_CLINIC_OR_DEPARTMENT_OTHER): Payer: Self-pay

## 2022-11-19 ENCOUNTER — Ambulatory Visit: Payer: 59 | Attending: Cardiovascular Disease

## 2022-11-20 ENCOUNTER — Encounter (HOSPITAL_BASED_OUTPATIENT_CLINIC_OR_DEPARTMENT_OTHER): Payer: Self-pay

## 2022-11-20 LAB — COMPREHENSIVE METABOLIC PANEL
ALT: 28 IU/L (ref 0–32)
AST: 25 IU/L (ref 0–40)
Albumin: 4.3 g/dL (ref 3.9–4.9)
Alkaline Phosphatase: 100 IU/L (ref 44–121)
BUN/Creatinine Ratio: 17 (ref 12–28)
BUN: 12 mg/dL (ref 8–27)
Bilirubin Total: <0.2 mg/dL (ref 0.0–1.2)
CO2: 27 mmol/L (ref 20–29)
Calcium: 10.2 mg/dL (ref 8.7–10.3)
Chloride: 102 mmol/L (ref 96–106)
Creatinine, Ser: 0.7 mg/dL (ref 0.57–1.00)
Globulin, Total: 3.5 g/dL (ref 1.5–4.5)
Glucose: 172 mg/dL — ABNORMAL HIGH (ref 70–99)
Potassium: 4.2 mmol/L (ref 3.5–5.2)
Sodium: 141 mmol/L (ref 134–144)
Total Protein: 7.8 g/dL (ref 6.0–8.5)
eGFR: 95 mL/min/{1.73_m2} (ref >59)

## 2022-11-20 LAB — LIPID PANEL
Chol/HDL Ratio: 3.6 ratio (ref 0.0–4.4)
Cholesterol, Total: 138 mg/dL (ref 100–199)
HDL: 38 mg/dL — ABNORMAL LOW (ref >39)
LDL Chol Calc (NIH): 68 mg/dL (ref 0–99)
Triglycerides: 194 mg/dL — ABNORMAL HIGH (ref 0–149)
VLDL Cholesterol Cal: 32 mg/dL (ref 5–40)

## 2022-11-21 ENCOUNTER — Telehealth: Payer: Self-pay

## 2022-11-21 DIAGNOSIS — Z0000 Encounter for general adult medical examination without abnormal findings: Secondary | ICD-10-CM

## 2022-11-21 NOTE — Telephone Encounter (Signed)
Called patient to discuss health coaching per referral for smoking cessation. Patient stated that she is interested in health coaching and has been scheduled for her initial session over the phone, 9/12 at 10:00am.   Renaee Munda, MS, ERHD, [facility]  Care Guide, Health & Wellness Coach [address], [address] [place] [address] Telephone: [phone] Email: [email or web address]

## 2022-11-22 ENCOUNTER — Telehealth (HOSPITAL_BASED_OUTPATIENT_CLINIC_OR_DEPARTMENT_OTHER): Payer: Self-pay

## 2022-11-22 ENCOUNTER — Telehealth: Payer: Self-pay | Admitting: Licensed Clinical Social Worker

## 2022-11-22 ENCOUNTER — Ambulatory Visit: Payer: 59

## 2022-11-22 NOTE — Telephone Encounter (Signed)
CSW contacted patient to inform that Amy Nedra Hai is unavailable today for scheduled appointment and she will contact patient next week for rescheduling. CSW unable to leave message as no voicemail. Lasandra Beech, LCSW, CCSW-MCS [phone]

## 2022-11-22 NOTE — Telephone Encounter (Addendum)
Attempted to contact patient, no answer, unable to leave vm due to box not being set up.    ----- Message from Alver Sorrow sent at 11/22/2022  9:12 AM EDT ----- Cholesterol numbers improved from previous.  Normal kidneys, liver, electrolytes.  LDL (bad cholesterol) is at goal.  Triglycerides of 194 which is slightly above goal of less than 150.  Recommend increasing physical activity, limiting carbohydrates such as breads, sugar, rice, pasta.  Recommend repeat cholesterol panel in 6 months for monitoring.

## 2022-11-26 ENCOUNTER — Encounter (HOSPITAL_BASED_OUTPATIENT_CLINIC_OR_DEPARTMENT_OTHER): Payer: Self-pay

## 2022-11-26 ENCOUNTER — Telehealth: Payer: Self-pay | Admitting: Cardiovascular Disease

## 2022-11-26 DIAGNOSIS — E785 Hyperlipidemia, unspecified: Secondary | ICD-10-CM

## 2022-11-26 NOTE — Telephone Encounter (Signed)
See phone note 9/16 

## 2022-11-26 NOTE — Telephone Encounter (Signed)
See phone note 9/16

## 2022-11-26 NOTE — Telephone Encounter (Signed)
Lab orders placed. Printed prior labs. Placed both in outgoing mail.   Alver Sorrow, NP

## 2022-11-26 NOTE — Telephone Encounter (Signed)
Patient is returning call to discuss lab results. She is requesting to have a physical copy mailed to her home address as well.

## 2022-11-27 ENCOUNTER — Encounter (HOSPITAL_BASED_OUTPATIENT_CLINIC_OR_DEPARTMENT_OTHER): Payer: Self-pay

## 2022-11-27 ENCOUNTER — Telehealth: Payer: Self-pay

## 2022-11-27 DIAGNOSIS — Z0000 Encounter for general adult medical examination without abnormal findings: Secondary | ICD-10-CM

## 2022-11-27 NOTE — Telephone Encounter (Signed)
Patient returned call to reschedule health coaching appointment. Patient has been rescheduled for 9/27 at 10:00am. Patient will be called at this time.   Renaee Munda, MS, ERHD, [facility]  Care Guide, Health & Wellness Coach [address] [place] [address] Telephone: [phone] Email: [email or web address]

## 2022-11-27 NOTE — Telephone Encounter (Signed)
Late entry -- reviewed with patient 9/16

## 2022-11-27 NOTE — Telephone Encounter (Signed)
Called patient to reschedule health coaching appointment. Patient did not answer. Was not able to leave a voicemail due to mailbox not being setup.   Renaee Munda, MS, ERHD, [facility]  Care Guide, Health & Wellness Coach [address], [address] [place] [address] Telephone: [phone] Email: [email or web address]

## 2022-11-28 ENCOUNTER — Encounter (HOSPITAL_COMMUNITY): Payer: Self-pay

## 2022-11-29 ENCOUNTER — Encounter (HOSPITAL_COMMUNITY): Payer: Self-pay

## 2022-11-30 ENCOUNTER — Ambulatory Visit (HOSPITAL_COMMUNITY): Payer: 59 | Attending: Cardiovascular Disease

## 2022-11-30 DIAGNOSIS — I2510 Atherosclerotic heart disease of native coronary artery without angina pectoris: Secondary | ICD-10-CM | POA: Diagnosis present

## 2022-11-30 DIAGNOSIS — Z9861 Coronary angioplasty status: Secondary | ICD-10-CM | POA: Insufficient documentation

## 2022-11-30 DIAGNOSIS — R079 Chest pain, unspecified: Secondary | ICD-10-CM | POA: Diagnosis present

## 2022-11-30 LAB — MYOCARDIAL PERFUSION IMAGING
LV dias vol: 145 mL (ref 46–106)
LV sys vol: 78 mL
Nuc Stress EF: 47 %
Peak HR: 86 {beats}/min
Rest HR: 67 {beats}/min
Rest Nuclear Isotope Dose: 10.2 mCi
SDS: 1
SRS: 1
SSS: 2
ST Depression (mm): 0 mm
Stress Nuclear Isotope Dose: 32.7 mCi
TID: 1.08

## 2022-11-30 MED ORDER — TECHNETIUM TC 99M TETROFOSMIN IV KIT
32.7000 | PACK | Freq: Once | INTRAVENOUS | Status: AC | PRN
  Administered 2022-11-30: 32.7 via INTRAVENOUS

## 2022-11-30 MED ORDER — TECHNETIUM TC 99M TETROFOSMIN IV KIT
10.2000 | PACK | Freq: Once | INTRAVENOUS | Status: AC | PRN
  Administered 2022-11-30: 10.2 via INTRAVENOUS

## 2022-11-30 MED ORDER — REGADENOSON 0.4 MG/5ML IV SOLN
0.4000 mg | Freq: Once | INTRAVENOUS | Status: AC
Start: 2022-11-30 — End: 2022-11-30
  Administered 2022-11-30: 0.4 mg via INTRAVENOUS

## 2022-12-03 ENCOUNTER — Other Ambulatory Visit (HOSPITAL_BASED_OUTPATIENT_CLINIC_OR_DEPARTMENT_OTHER): Payer: Self-pay | Admitting: *Deleted

## 2022-12-03 ENCOUNTER — Encounter (HOSPITAL_BASED_OUTPATIENT_CLINIC_OR_DEPARTMENT_OTHER): Payer: Self-pay

## 2022-12-03 ENCOUNTER — Telehealth: Payer: Self-pay

## 2022-12-03 DIAGNOSIS — R079 Chest pain, unspecified: Secondary | ICD-10-CM

## 2022-12-03 DIAGNOSIS — Z0000 Encounter for general adult medical examination without abnormal findings: Secondary | ICD-10-CM

## 2022-12-03 DIAGNOSIS — R9430 Abnormal result of cardiovascular function study, unspecified: Secondary | ICD-10-CM

## 2022-12-03 NOTE — Telephone Encounter (Signed)
Patient left message that she needed to cancel her appointment on 9/27 at 10:00am. Called patient back to back twice and patient did not answer. I was not able to leave a voicemail either time due to mailbox not being setup. Will call when able at a later time.    Renaee Munda, MS, ERHD, [facility]  Care Guide, Health & Wellness Coach [address] [place] [address] Telephone: [phone] Email: [email or web address]

## 2022-12-04 ENCOUNTER — Telehealth (HOSPITAL_BASED_OUTPATIENT_CLINIC_OR_DEPARTMENT_OTHER): Payer: Self-pay | Admitting: Cardiovascular Disease

## 2022-12-04 NOTE — Telephone Encounter (Signed)
Patient called to follow-up on lab results.  Patient wants a copy of the lab results mailed to her.

## 2022-12-04 NOTE — Telephone Encounter (Signed)
Returned call to patient, no answer, or VM set up. Labs have been mailed to patient as documented in earlier encounter.

## 2022-12-05 ENCOUNTER — Telehealth: Payer: Self-pay

## 2022-12-05 ENCOUNTER — Encounter (HOSPITAL_BASED_OUTPATIENT_CLINIC_OR_DEPARTMENT_OTHER): Payer: Self-pay

## 2022-12-05 DIAGNOSIS — Z0000 Encounter for general adult medical examination without abnormal findings: Secondary | ICD-10-CM

## 2022-12-05 NOTE — Telephone Encounter (Signed)
Return call attempted, no answer and no VM set up. Echo results will also be mailed to patient.

## 2022-12-05 NOTE — Telephone Encounter (Signed)
Called to reschedule health coaching appointment that patient left voicemail to cancel. Patient did not answer. Was unable to leave voicemail due to mailbox not being set up. Cancelled appointment per patient's request.    Renaee Munda, MS, ERHD, [facility]  Care Guide, Health & Wellness Coach [address], [address] [place] [address] Telephone: [phone] Email: [email or web address]

## 2022-12-07 ENCOUNTER — Ambulatory Visit: Payer: 59

## 2022-12-11 NOTE — Progress Notes (Signed)
[facility] - Clinic Note  12/17/2022   CHIEF COMPLAINT Patient presents for Retina Evaluation  HISTORY OF PRESENT ILLNESS: Leah Norton is a 67 y.o. female who presents to the clinic today for:  HPI     Retina Evaluation   In both eyes.  This started years ago.  I, the attending physician,  performed the HPI with the patient and updated documentation appropriately.        Comments   Patient is here today based on a referral for a diabetic exam. She has been a diabetic for 5-10 yrs. Her blood sugar was 130. She has noticed a decrease in her vision especially at night. She does not use eye drops.       Last edited by Rennis Chris, MD on 12/17/2022  8:59 PM.    Pt is here on the referral of Baruch Goldmann, FNP for DM exam, pt would also like a referral for ptosis, pts last A1c was 7.9 on 07.25.23, pt states she has been diabetic for 5-10 years, she is on janumet, she states her blood sugar runs around 130   Referring physician: Vladimir Crofts, FNP [address] [place],  [place] [address]  HISTORICAL INFORMATION:  Selected notes from the MEDICAL RECORD NUMBER Referred by Baruch Goldmann, FNP for DM exam LEE:  Ocular Hx- PMH-   CURRENT MEDICATIONS: No current outpatient medications on file. (Ophthalmic Drugs)   No current facility-administered medications for this visit. (Ophthalmic Drugs)   Current Outpatient Medications (Other)  Medication Sig   amLODipine (NORVASC) 5 MG tablet Take 1 tablet (5 mg total) by mouth daily.   buPROPion (WELLBUTRIN SR) 150 MG 12 hr tablet Take by mouth.   chlorthalidone (HYGROTON) 25 MG tablet Take 25 mg by mouth every morning.   Cholecalciferol (VITAMIN D3) 1000 units CAPS Take 2,000 Units by mouth daily.   clopidogrel (PLAVIX) 75 MG tablet Take 1 tablet (75 mg total) by mouth daily. Please keep upcoming appointment in June 2022 for future refills. Thank you   COD LIVER OIL PO Take by mouth.   cyclobenzaprine  (FLEXERIL) 5 MG tablet Take 1 tablet (5 mg total) by mouth at bedtime.   Docosahexaenoic Acid (ALGAL OMEGA-3 DHA) 200 MG CAPS Take by mouth.   furosemide (LASIX) 20 MG tablet TAKE 1 TABLET(20 MG) BY MOUTH DAILY AS NEEDED. Please make yearly appt with Dr. Katrinka Blazing for April 2022 for future refills. Thank you 1st attempt   Hyoscyamine Sulfate SL 0.125 MG SUBL 0.125 mg every 4 (four) hours as needed (cramping).   ibuprofen (ADVIL) 600 MG tablet Take 1 tablet (600 mg total) by mouth every 6 (six) hours as needed.   Lancets (ONETOUCH DELICA PLUS LANCET33G) MISC daily. for testing blood sugar   loratadine (CLARITIN) 10 MG tablet Take 10 mg by mouth daily.   MIRALAX 17 GM/SCOOP powder SMARTSIG:1 scoopful By Mouth Daily   Multiple Vitamin (MULTIVITAMIN) tablet Take 1 tablet by mouth daily. OTC   [START ON 12/25/2022] nicotine (NICODERM CQ) 14 mg/24hr patch Place 1 patch (14 mg total) onto the skin daily for 14 days. FOR 2 WEEKS AFTER YOU FINISH THE 21 MG PATCH   nicotine (NICODERM CQ) 21 mg/24hr patch Place 1 patch (21 mg total) onto the skin daily. FOR 6 WEEKS   [START ON 01/08/2023] nicotine (NICODERM CQ) 7 mg/24hr patch Place 1 patch (7 mg total) onto the skin daily. FOR 2 WEEKS AFTER YOU FINISH THE 14  MG RX   nitroGLYCERIN (NITROSTAT) 0.4 MG SL tablet Place 1 tablet (0.4 mg total) under the tongue every 5 (five) minutes as needed for chest pain.   ONETOUCH VERIO test strip daily. for testing blood sugar   pantoprazole (PROTONIX) 40 MG tablet Take 1 tablet (40 mg total) by mouth daily.   potassium chloride (KLOR-CON) 10 MEQ tablet SMARTSIG:1 Tablet(s) By Mouth   rosuvastatin (CRESTOR) 10 MG tablet Take 10 mg by mouth daily.   sitaGLIPtin-metformin (JANUMET) 50-1000 MG per tablet Take 1 tablet by mouth 2 (two) times daily with a meal.   SODIUM FLUORIDE 5000 PLUS 1.1 % CREA dental cream at bedtime.   tiZANidine (ZANAFLEX) 4 MG tablet Take 1 tablet (4 mg total) by mouth every 6 (six) hours as needed for  muscle spasms.   Zinc Sulfate (ZINC 15 PO) Take by mouth.   atenolol (TENORMIN) 50 MG tablet Take 1 tablet (50 mg total) by mouth daily.   losartan (COZAAR) 100 MG tablet Take 1 tablet (100 mg total) by mouth daily.   No current facility-administered medications for this visit. (Other)   REVIEW OF SYSTEMS: ROS   Positive for: Gastrointestinal, Neurological, Musculoskeletal, Endocrine, Eyes Last edited by Charlette Caffey, COT on 12/17/2022  8:37 AM.     ALLERGIES Allergies  Allergen Reactions   Codeine Rash   Erythromycin Rash   Penicillins Rash    Has patient had a PCN reaction causing immediate rash, facial/tongue/throat swelling, SOB or lightheadedness with hypotension: Yes  Has patient had a PCN reaction causing severe rash involving mucus membranes or skin necrosis: No  Has patient had a PCN reaction that required hospitalization: No  Has patient had a PCN reaction occurring within the last 10 years: Yes  If all of the above answers are "NO", then may proceed with Cephalosporin use.  Has patient had a PCN reaction causing immediate rash, facial/tongue/throat swelling, SOB or lightheadedness with hypotension: Yes, Has patient had a PCN reaction causing severe rash involving mucus membranes or skin necrosis: No, Has patient had a PCN reaction that required hospitalization: No, Has patient had a PCN reaction occurring within the last 10 years: Yes, If all of the above answers are "NO", then may proceed with Cephalosporin use.   Sulfa Antibiotics Rash   Sulfonamide Derivatives Rash   Vicodin [Hydrocodone-Acetaminophen] Rash    Allergy to HYDROCODONE. Not tylenol    PAST MEDICAL HISTORY Past Medical History:  Diagnosis Date   Anginal pain (HCC)    Arthritis    "right anikle" (02/29/2016)   CAD in native artery 02/08/2016   Three-vessel coronary artery calcification on CT scan   Complication of anesthesia    "had reaction w/hives, rash, itching" (02/29/2016)    Constipation    Coronary artery disease    12/17 PCI with DESx1 to pLAD EF normal   Enlarged heart    GERD (gastroesophageal reflux disease)    High cholesterol    Hypertension    Migraine    hx   Stroke (HCC)    Symptomatic anemia 07/10/2016   Type II diabetes mellitus (HCC)    Past Surgical History:  Procedure Laterality Date   ABDOMINAL HYSTERECTOMY     CARDIAC CATHETERIZATION N/A 02/29/2016   Procedure: Left Heart Cath and Coronary Angiography;  Surgeon: Lyn Records, MD;  Location: [facility] INVASIVE CV LAB;  Service: Cardiovascular;  Laterality: N/A;   CARDIAC CATHETERIZATION N/A 02/29/2016   Procedure: Coronary Stent Intervention;  Surgeon: Lyn Records, MD;  Location: [facility] INVASIVE CV LAB;  Service: Cardiovascular;  Laterality: N/A;   CARDIAC CATHETERIZATION  2005   "no blockages"   COLONOSCOPY WITH PROPOFOL N/A 03/15/2014   Procedure: COLONOSCOPY WITH PROPOFOL;  Surgeon: Charolett Bumpers, MD;  Location: [facility] ENDOSCOPY;  Service: Endoscopy;  Laterality: N/A;   CORONARY ANGIOPLASTY WITH STENT PLACEMENT     ESOPHAGOGASTRODUODENOSCOPY N/A 07/11/2016   Procedure: ESOPHAGOGASTRODUODENOSCOPY (EGD);  Surgeon: Carman Ching, MD;  Location: [facility] ENDOSCOPY;  Service: Endoscopy;  Laterality: N/A;   FRACTURE SURGERY     MULTIPLE TOOTH EXTRACTIONS     teeth removed   ORIF ANKLE FRACTURE Right 11/12/2012   Procedure: OPEN REDUCTION INTERNAL FIXATION (ORIF) ANKLE FRACTURE;  Surgeon: Cheral Almas, MD;  Location: [facility] OR;  Service: Orthopedics;  Laterality: Right;  Open reduction internal fixation ankle fracture, trimalleolar   FAMILY HISTORY Family History  Problem Relation Age of Onset   Hypertension Mother    SOCIAL HISTORY Social History   Tobacco Use   Smoking status: Some Days    Current packs/day: 0.00    Average packs/day: 0.5 packs/day for 42.0 years (21.0 ttl pk-yrs)    Types: Cigarettes    Start date: 01/16/1976    Last attempt to quit: 01/15/2018    Years since quitting: 4.9    Smokeless tobacco: Never  Vaping Use   Vaping status: Never Used  Substance Use Topics   Alcohol use: Yes    Comment: occasionally   Drug use: No       OPHTHALMIC EXAM:  Base Eye Exam     Visual Acuity (Snellen - Linear)       Right Left   Dist cc 20/40 20/30 +2   Dist ph cc NI NI    Correction: Glasses         Tonometry (Tonopen, 8:42 AM)       Right Left   Pressure 19 20  squeezing        Pupils       Dark Light Shape React APD   Right 3 2 Round Slow None   Left 3 2 Round Slow None         Extraocular Movement       Right Left    Full, Ortho Full, Ortho         Neuro/Psych     Oriented x3: Yes         Dilation     Both eyes: 2.5% Phenylephrine, 1.0% Mydriacyl @ 8:38 AM           Slit Lamp and Fundus Exam     Slit Lamp Exam       Right Left   Lids/Lashes Dermatochalasis - upper lid Dermatochalasis - upper lid; mild ptosis   Conjunctiva/Sclera mild melanosis mild melanosis   Cornea tear film debris, fine endo pigment, trace guttata tear film debris, fine endo pigment, trace guttata   Anterior Chamber deep and clear deep and clear   Iris Round and dilated, No NVI Round and dilated, No NVI   Lens 2-3+ Nuclear sclerosis, 2-3+ Cortical cataract 2-3+ Nuclear sclerosis, 2-3+ Cortical cataract   Anterior Vitreous mild syneresis mild syneresis         Fundus Exam       Right Left   Disc mild Pallor, Sharp rim, +cupping, thin inferior rim Pink and Sharp   C/D Ratio 0.65 0.5   Macula Flat, Good foveal reflex, No heme or edema Flat, Good foveal reflex, No heme or edema  Vessels attenuated, mild tortuosity attenuated, mild tortuosity   Periphery Attached, No heme, scattered peripheral drusen Attached, No heme, scattered peripheral drusen           Refraction     Wearing Rx       Sphere Cylinder Axis Add   Right -2.25 +0.50 059 +2.50   Left -2.00 +1.00 164 +2.50           IMAGING AND PROCEDURES  Imaging and Procedures  for 12/17/2022  OCT, Retina - OU - Both Eyes       Right Eye Quality was good. Central Foveal Thickness: 244. Progression has no prior data. Findings include normal foveal contour, no IRF, no SRF (Partial PVD).   Left Eye Quality was good. Central Foveal Thickness: 226. Progression has no prior data. Findings include normal foveal contour, no IRF, no SRF.   Notes *Images captured and stored on drive  Diagnosis / Impression:  NFP, no IRF/SRF OU No DME OU  Clinical management:  See below  Abbreviations: NFP - Normal foveal profile. CME - cystoid macular edema. PED - pigment epithelial detachment. IRF - intraretinal fluid. SRF - subretinal fluid. EZ - ellipsoid zone. ERM - epiretinal membrane. ORA - outer retinal atrophy. ORT - outer retinal tubulation. SRHM - subretinal hyper-reflective material. IRHM - intraretinal hyper-reflective material           ASSESSMENT/PLAN:   ICD-10-CM   1. Diabetes mellitus type 2 without retinopathy (HCC)  E11.9 OCT, Retina - OU - Both Eyes    2. Long term (current) use of oral hypoglycemic drugs  Z79.84     3. Essential hypertension  I10     4. Hypertensive retinopathy of both eyes  H35.033     5. Combined forms of age-related cataract of both eyes  H25.813     6. Ptosis of left eyelid  H02.402     7. Dermatochalasis of both upper eyelids  H02.831    H02.834      1,2. Diabetes mellitus, type 2 without retinopathy  - last A1c 7.9 on 07.05.23 - The incidence, risk factors for progression, natural history and treatment options for diabetic retinopathy  were discussed with patient.   - The need for close monitoring of blood glucose, blood pressure, and serum lipids, avoiding cigarette or any type of tobacco, and the need for long term follow up was also discussed with patient. - f/u in 1 year, sooner prn  3,4. Hypertensive retinopathy OU - discussed importance of tight BP control - monitor  5. Mixed Cataract OU - The symptoms of  cataract, surgical options, and treatments and risks were discussed with patient. - discussed diagnosis and progression - likely visually signfiicant - will refer to Dr. Zenaida Niece for cataract evaluation and management  6,7. Ptosis OS, dermatochalasis OU - will refer to Oculoplastics for further evaluation and management  Ophthalmic Meds Ordered this visit:  No orders of the defined types were placed in this encounter.    Return in about 1 year (around 12/17/2023) for f/u DM exam, DFE, OCT.  There are no Patient Instructions on file for this visit.  Explained the diagnoses, plan, and follow up with the patient and they expressed understanding.  Patient expressed understanding of the importance of proper follow up care.   This document serves as a record of services personally performed by Karie Chimera, MD, PhD. It was created on their behalf by Glee Arvin. Manson Passey, OA an ophthalmic technician. The creation of this record is  the provider's dictation and/or activities during the visit.    Electronically signed by: Glee Arvin. Manson Passey, OA 12/17/22 9:04 PM   Karie Chimera, M.D., Ph.D. Diseases & Surgery of the Retina and Vitreous [facility] 12/17/2022  I have reviewed the above documentation for accuracy and completeness, and I agree with the above. Karie Chimera, M.D., Ph.D. 12/17/22 9:04 PM   Abbreviations: M myopia (nearsighted); A astigmatism; H hyperopia (farsighted); P presbyopia; Mrx spectacle prescription;  CTL contact lenses; OD right eye; OS left eye; OU both eyes  XT exotropia; ET esotropia; PEK punctate epithelial keratitis; PEE punctate epithelial erosions; DES dry eye syndrome; MGD meibomian gland dysfunction; ATs artificial tears; PFAT's preservative free artificial tears; NSC nuclear sclerotic cataract; PSC posterior subcapsular cataract; ERM epi-retinal membrane; PVD posterior vitreous detachment; RD retinal detachment; DM diabetes mellitus; DR diabetic  retinopathy; NPDR non-proliferative diabetic retinopathy; PDR proliferative diabetic retinopathy; CSME clinically significant macular edema; DME diabetic macular edema; dbh dot blot hemorrhages; CWS cotton wool spot; POAG primary open angle glaucoma; C/D cup-to-disc ratio; HVF humphrey visual field; GVF goldmann visual field; OCT optical coherence tomography; IOP intraocular pressure; BRVO Branch retinal vein occlusion; CRVO central retinal vein occlusion; CRAO central retinal artery occlusion; BRAO branch retinal artery occlusion; RT retinal tear; SB scleral buckle; PPV pars plana vitrectomy; VH Vitreous hemorrhage; PRP panretinal laser photocoagulation; IVK intravitreal kenalog; VMT vitreomacular traction; MH Macular hole;  NVD neovascularization of the disc; NVE neovascularization elsewhere; AREDS age related eye disease study; ARMD age related macular degeneration; POAG primary open angle glaucoma; EBMD epithelial/anterior basement membrane dystrophy; ACIOL anterior chamber intraocular lens; IOL intraocular lens; PCIOL posterior chamber intraocular lens; Phaco/IOL phacoemulsification with intraocular lens placement; PRK photorefractive keratectomy; LASIK laser assisted in situ keratomileusis; HTN hypertension; DM diabetes mellitus; COPD chronic obstructive pulmonary disease

## 2022-12-17 ENCOUNTER — Telehealth: Payer: Self-pay

## 2022-12-17 ENCOUNTER — Ambulatory Visit (INDEPENDENT_AMBULATORY_CARE_PROVIDER_SITE_OTHER): Payer: 59 | Admitting: Ophthalmology

## 2022-12-17 ENCOUNTER — Encounter (INDEPENDENT_AMBULATORY_CARE_PROVIDER_SITE_OTHER): Payer: Self-pay | Admitting: Ophthalmology

## 2022-12-17 DIAGNOSIS — I10 Essential (primary) hypertension: Secondary | ICD-10-CM

## 2022-12-17 DIAGNOSIS — H02831 Dermatochalasis of right upper eyelid: Secondary | ICD-10-CM

## 2022-12-17 DIAGNOSIS — E119 Type 2 diabetes mellitus without complications: Secondary | ICD-10-CM | POA: Diagnosis not present

## 2022-12-17 DIAGNOSIS — H35033 Hypertensive retinopathy, bilateral: Secondary | ICD-10-CM

## 2022-12-17 DIAGNOSIS — Z7984 Long term (current) use of oral hypoglycemic drugs: Secondary | ICD-10-CM | POA: Diagnosis not present

## 2022-12-17 DIAGNOSIS — H25813 Combined forms of age-related cataract, bilateral: Secondary | ICD-10-CM

## 2022-12-17 DIAGNOSIS — Z0000 Encounter for general adult medical examination without abnormal findings: Secondary | ICD-10-CM

## 2022-12-17 DIAGNOSIS — H02834 Dermatochalasis of left upper eyelid: Secondary | ICD-10-CM

## 2022-12-17 DIAGNOSIS — H02402 Unspecified ptosis of left eyelid: Secondary | ICD-10-CM

## 2022-12-17 NOTE — Telephone Encounter (Signed)
Called patient to rescheduled cancelled health coaching appointment. Patient requested a call on 10/8 in the afternoon around 2pm. Patient has been scheduled for a telephonic health coaching appointment on 10/8 at 2:00pm and will be called at that time.    Renaee Munda, MS, ERHD, [facility]  Care Guide, Health & Wellness Coach [address] [place] [address] Telephone: [phone] Email: [email or web address]

## 2022-12-18 ENCOUNTER — Ambulatory Visit: Payer: 59 | Attending: Cardiovascular Disease

## 2022-12-18 DIAGNOSIS — Z0000 Encounter for general adult medical examination without abnormal findings: Secondary | ICD-10-CM

## 2022-12-18 NOTE — Progress Notes (Signed)
HEALTH & WELLNESS COACHING INITIAL INTAKE   Appointment Outcome:  Completed, Session #: Initial                        Start time: 2:01pm   End time: 2:42pm  Total Mins: 41 minutes     What are the Patient's goals from Coaching?  Patient desires to quit smoking.   Why did they seek coaching now? Patient stated that she has heart disease and wants to improve her health by quitting smoking.   Readiness  What stage is the patient in regarding their goal(s)?  Preparation    Coaching Progress Notes: Patient reported that she smokes between 10 to 20 cigarettes per day. Patient shared that the frequency of her smoking depends on how busy she is throughout the day.  Patient stated that she was able to quit after being hospitalized for approximately 2 months or so. Patient mentioned that she did well until she was triggered by the smell and sight of someone else smoking. Patient shared that at that point she began to have the taste for nicotine again and became addicted to smoking once more. Patient shared that she wants to quit smoking because of her heart condition and that her family is concerned about her smoking.   Discussed with patient what her smoking routine looks like during the day. Patient stated that she smokes in the morning after waking up, after drinking coffee, eating, sometimes after a bathroom break, before bed, and sometimes if she wakes up during the night. Patient stated that she has not tried the patches or other nicotine replacement therapy options when she had quit previously. Patient was educated on how these products work (nicotine patches, gum, lozenges, medications). Patient expressed interested in trying the nicotine patches and lozenges.   Discussed with patient how she would be able to utilize the nicotine patches and lozenges together. Patient is considering using the lozenges to help her refrain from removing the nicotine patch to smoke at the times that she has  the strongest urge to smoke in the morning, after eating, and before bed. Discussed with patient her concern about picking up a cigarette when her hands are not busy. Patient shared that she engages in arts and crafts to keep busy and will be able to work on a few projects for [employer] signs.   Discussed with patient the challenge of being around others who smoke that may be a trigger for her to smoke. Patient stated that she may be triggered to ask for a cigarette. Helped patient reframe her thinking around this challenge to how she would work this this obstacle. Patient decided that she would remove herself from the area instead of asking the person not to smoke. Patient stated that if she must stay in the area, she would talk herself through it with positive self-talk and remind herself of her reasoning for wanting to quit (e.g., improving her heart health).      Coaching Outcomes Patient co-created the following action plan to implement over the next two weeks.   I verified that the nicotine patch prescription was previously sent into the patient's pharmacy by Dr. Duke Salvia in September 2024. Patient had not picked up prescription because she was told it was cheaper to purchase the patches over the counter.   Informed patient that I would call to verify the pricing of the nicotine patches vs OTC nicotine patches.     AGREEMENTS SECTION   Overall  Goal(s): Smoking cessation                           Agreement/Action Steps:  Purchase 21mg  nicotine patches from pharmacy Wear patches as prescribed Purchase nicotine lozenges from pharmacy Use lozenges when cravings are intense instead of smoking  (e.g., after waking up, after drinking coffee, after eating, before bed) Engage in hobbies to keep hands busy (e.g., arts and crafts) Remove self from around others when they are smoking  Utilize family as support system   Agreement Signed & Returned? Reviewed Coaching Agreement and Code of  Ethics with Patient during initial session. Answered any questions the patient had if any regarding the Coaching Agreement and Code of Ethics. Patient verbally agreed to adhere to the Coaching Agreement and to abide by the Code of Ethics.  Mailed patient with a hard/electronic copy of the Coaching Agreement and Code of Ethics.    Resources: Called patient's pharmacy to confirm if price of prescribed nicotine patches are lower than OTC nicotine patches prices. Pharmacist was able to confirm that with a discount card on the patient's account brought the total of the 21mg  patches down to $37.45 for six weeks of patches. Called patient back to update her of this information. Patient did not answer. Left message with update for patient and informed her that prescription was being filled for her to pick up today.

## 2022-12-20 ENCOUNTER — Ambulatory Visit (INDEPENDENT_AMBULATORY_CARE_PROVIDER_SITE_OTHER): Payer: 59

## 2022-12-20 DIAGNOSIS — R079 Chest pain, unspecified: Secondary | ICD-10-CM

## 2022-12-20 DIAGNOSIS — R9430 Abnormal result of cardiovascular function study, unspecified: Secondary | ICD-10-CM

## 2022-12-20 LAB — ECHOCARDIOGRAM COMPLETE
Area-P 1/2: 3.65 cm2
Calc EF: 45.8 %
MV M vel: 3.67 m/s
MV Peak grad: 53.9 mm[Hg]
S' Lateral: 4.48 cm
Single Plane A2C EF: 40.2 %
Single Plane A4C EF: 50.9 %

## 2022-12-21 ENCOUNTER — Telehealth (HOSPITAL_BASED_OUTPATIENT_CLINIC_OR_DEPARTMENT_OTHER): Payer: Self-pay | Admitting: *Deleted

## 2022-12-21 DIAGNOSIS — I10 Essential (primary) hypertension: Secondary | ICD-10-CM

## 2022-12-21 DIAGNOSIS — Z5181 Encounter for therapeutic drug level monitoring: Secondary | ICD-10-CM

## 2022-12-21 MED ORDER — SPIRONOLACTONE 25 MG PO TABS: 25.0000 mg | ORAL_TABLET | Freq: Every day | ORAL | 3 refills | Status: DC

## 2022-12-21 MED ORDER — SACUBITRIL-VALSARTAN 97-103 MG PO TABS: 1.0000 | ORAL_TABLET | Freq: Two times a day (BID) | ORAL | 5 refills | Status: DC

## 2022-12-21 NOTE — Telephone Encounter (Signed)
-----   Message from [facility] sent at 12/20/2022 10:33 PM EDT ----- Echo shows that your heart function is weaker than in 2019 when you had your last echo.  Normal is when your heart pumps at least 55% of the blood out with each beat.  It was 40-45% in 2019 and now it is 30-35%.  This is even more reason to better control your BP.  Amlodipine isn't the best medicine with heart failure.  Stop the amlodipine and losartan.  Switch to Entresto 97/103mg  bid.  Add spironolactone 25mg  daily.  BMP in 1 week.  Keep scheduled f/u.

## 2022-12-21 NOTE — Telephone Encounter (Signed)
Entresto Rx'd today, will send to PA team to initiate if needed

## 2022-12-21 NOTE — Telephone Encounter (Signed)
Advised patient of lab results and medication changes, verbalized understanding  

## 2022-12-24 ENCOUNTER — Telehealth (HOSPITAL_BASED_OUTPATIENT_CLINIC_OR_DEPARTMENT_OTHER): Payer: Self-pay | Admitting: Pharmacy Technician

## 2022-12-24 ENCOUNTER — Other Ambulatory Visit (HOSPITAL_COMMUNITY): Payer: Self-pay

## 2022-12-24 NOTE — Telephone Encounter (Signed)
Pharmacy Patient Advocate Encounter   Received notification from Pt Calls Messages that prior authorization for entresto is required/requested.   Insurance verification completed.   The patient is insured through  [employer]  .   Per test claim: Refill too soon. PA is not needed at this time. Medication was filled 12/21/22. Next eligible fill date is 01/13/23.

## 2022-12-24 NOTE — Addendum Note (Signed)
Addended by: Regis Bill B on: 12/24/2022 04:48 PM   Modules accepted: Orders

## 2022-12-24 NOTE — Telephone Encounter (Signed)
Discussed KDur with Dr Duke Salvia and las K+4.1 9/9 Ok to stop potassium and check BMET 1 week and again 1-2 months   Left message to call back

## 2022-12-25 NOTE — Telephone Encounter (Signed)
Refill too soon. PA is not needed at this time. Medication was filled 12/21/22. Next eligible fill date is 01/13/23.

## 2022-12-26 ENCOUNTER — Ambulatory Visit (INDEPENDENT_AMBULATORY_CARE_PROVIDER_SITE_OTHER): Payer: 59 | Admitting: Cardiovascular Disease

## 2022-12-26 ENCOUNTER — Encounter (HOSPITAL_BASED_OUTPATIENT_CLINIC_OR_DEPARTMENT_OTHER): Payer: Self-pay | Admitting: Cardiovascular Disease

## 2022-12-26 VITALS — BP 148/76 | HR 72 | Ht 64.0 in | Wt 175.7 lb

## 2022-12-26 DIAGNOSIS — I2510 Atherosclerotic heart disease of native coronary artery without angina pectoris: Secondary | ICD-10-CM | POA: Diagnosis not present

## 2022-12-26 DIAGNOSIS — I5020 Unspecified systolic (congestive) heart failure: Secondary | ICD-10-CM

## 2022-12-26 DIAGNOSIS — R9430 Abnormal result of cardiovascular function study, unspecified: Secondary | ICD-10-CM | POA: Diagnosis not present

## 2022-12-26 MED ORDER — DAPAGLIFLOZIN PROPANEDIOL 10 MG PO TABS: 10.0000 mg | ORAL_TABLET | Freq: Every day | ORAL | 5 refills | Status: DC

## 2022-12-26 NOTE — Progress Notes (Deleted)
Cardiology Office Note:  .    Date:  12/31/2022  ID:  [name], DOB 1955-11-04, MRN [id number] PCP: Leah Crofts, FNP  [facility] Providers Cardiologist:  Lesleigh Noe, MD (Inactive)     History of Present Illness: .    Leah Norton is a 67 y.o. 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  She was previously a patient of Leah Norton.  She had a prior cardiac catheterization 2005 that revealed nonobstructive disease.  Nuclear stress test in 2017 revealed LVEF 49% with anterior, septal, and apical infarct with peri-infarct ischemia.  She subsequently underwent left heart cath and was found to have 70-80% proximal LAD stenosis, 50% mid LAD stenosis, 70% ramus intermedius, and 65% left circumflex disease.  She underwent successful PCI of the proximal LAD.  She was initially started on ticagrelor but transition to clopidogrel due to dyspnea.  Her most recent echo 01/2018 revealed LVEF 40-45% with diffuse hypokinesis and moderate hypokinesis of the inferior myocardium.  She had grade 2 diastolic dysfunction.  She last saw Leah Norton 09/2021 and was feeling well.  She continued to smoke.   At her visit 11/2021 she reported intermittent chest pain.  Nuclear stress revealed LVEF 47% and no ischemia.  Echo revealed LVEF 30-35% with grade 1 diastolic dysfunciton.  RV function was normal.  Amlodipine and losartan were discontinued.  She was started on Entresto and spironolactone was added.   ROS:  Please see the history of present illness. All other systems are reviewed and negative.  (+) Occasional discomfort of upper left shoulder (+) Exertional shortness of breath (+) Recent right knee pain (+) Chronic right ankle edema  Studies Reviewed: Marland Kitchen       EKG 11/13/22: Sinus rhythm.  Rate 78 bpm. LAD.  Nonspecific T wave abnormality.  CT Chest  07/23/2022: IMPRESSION: 1. Lung-RADS 2, benign appearance or  behavior. Continue annual screening with low-dose chest CT without contrast in 12 months. 2. Dilated main pulmonary artery, suggesting pulmonary arterial hypertension. 3. Three-vessel coronary atherosclerosis. 4. Aberrant right subclavian artery. 5. Bilateral adrenal adenomas, not substantially changed from 05/08/2017 CT, for which no follow-up imaging is recommended. 6. Aortic Atherosclerosis (ICD10-I70.0) and Emphysema (ICD10-J43.9).  Risk Assessment/Calculations:         Physical Exam:    VS:  BP (!) 148/76 (BP Location: Left Arm, Patient Position: Sitting, Cuff Size: Normal)   Pulse 72   Ht 5\' 4"  (1.626 m)   Wt 175 lb 11.2 oz (79.7 kg)   SpO2 94%   BMI 30.16 kg/m  , BMI Body mass index is 30.16 kg/m. GENERAL:  Well appearing HEENT: Pupils equal round and reactive, fundi not visualized, oral mucosa unremarkable NECK:  No jugular venous distention, waveform within normal limits, carotid upstroke brisk and symmetric, no bruits, no thyromegaly LUNGS:  Clear to auscultation bilaterally HEART:  RRR.  PMI not displaced or sustained,S1 and S2 within normal limits, no S3, no S4, no clicks, no rubs, no murmurs ABD:  Flat, positive bowel sounds normal in frequency in pitch, no bruits, no rebound, no guarding, no midline pulsatile mass, no hepatomegaly, no splenomegaly EXT:  2 plus pulses throughout, no edema, no cyanosis no clubbing SKIN:  No rashes no nodules NEURO:  Cranial nerves II through XII grossly intact, motor grossly intact throughout PSYCH:  Cognitively intact, oriented to person place and time  Wt Readings from Last 3 Encounters:  12/26/22  175 lb 11.2 oz (79.7 kg)  11/30/22 174 lb (78.9 kg)  11/13/22 174 lb (78.9 kg)     ASSESSMENT AND PLAN: .    # HFrEF:  Echocardiogram showed reduced ejection fraction (30-35%). No ischemia on stress test. No current symptoms of fluid retention or shortness of breath. Discussed the importance of blood pressure control and fluid  management. -Stop Amlodipine and Losartan. -Start Entresto and Spironolactone. -Obtain a home scale and monitor weight daily. -Call if weight increases by 2 pounds in a day or 5 pounds in a week. -Recheck echocardiogram in 3 months.  # Hypertension Goal is to consistently maintain blood pressure under 130/80. -Adjust medications as above. -Monitor blood pressure at home.  # Diabetes A1c was 7.9 at last check. Discussed the benefits of Farxiga for both diabetes and heart failure. -Start Farxiga.  # Follow-up -Check labs in 1 week to assess kidney function, liver function, and electrolytes. -Follow-up appointment in 1 month.     # CAD s/p PCI:  # CVA:  No active symptoms.  Continue medical management as above.    Dispo:  FU with APP/Maebel Marasco C. Duke Salvia, MD, [facility] in 1-2 months.  I,Leah Norton,acting as a Neurosurgeon for Leah Si, MD.,have documented all relevant documentation on the behalf of Leah Si, MD,as directed by  Leah Si, MD while in the presence of Leah Si, MD.  I, Leah Norton C. Duke Salvia, MD have reviewed all documentation for this visit.  The documentation of the exam, diagnosis, procedures, and orders on 12/31/2022 are all accurate and complete.   Signed, Leah Si, MD

## 2022-12-26 NOTE — Progress Notes (Signed)
Cardiology Office Note:  .    Date:  12/31/2022  ID:  [name], DOB [birth year], MRN [id number] PCP: Vladimir Crofts, FNP  [facility] Providers Cardiologist:  Lesleigh Noe, MD (Inactive)     History of Present Illness: .    Leah Norton is a 67 y.o. 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  She was previously a patient of Dr. Katrinka Blazing.  She had a prior cardiac catheterization 2005 that revealed nonobstructive disease.  Nuclear stress test in 2017 revealed LVEF 49% with anterior, septal, and apical infarct with peri-infarct ischemia.  She subsequently underwent left heart cath and was found to have 70-80% proximal LAD stenosis, 50% mid LAD stenosis, 70% ramus intermedius, and 65% left circumflex disease.  She underwent successful PCI of the proximal LAD.  She was initially started on ticagrelor but transition to clopidogrel due to dyspnea.  Her most recent echo 01/2018 revealed LVEF 40-45% with diffuse hypokinesis and moderate hypokinesis of the inferior myocardium.  She had grade 2 diastolic dysfunction.  She last saw Dr. Katrinka Blazing 09/2021 and was feeling well.  She continued to smoke.   At her visit 11/2021 she reported intermittent chest pain.  Nuclear stress revealed LVEF 47% and no ischemia.  Echo revealed LVEF 30-35% with grade 1 diastolic dysfunciton.  RV function was normal.  Amlodipine and losartan were discontinued.  She was started on Entresto and spironolactone was added.  Today, she states she is feeling okay with no recurring chest discomfort. In the office her blood pressure is 151/81 initially, and 148/76 initially. She notes that she was rushing on the way to her appointment. Due to some confusion, she thought she needed to stop the [facility] after receiving a call from our office. We reviewed her recent medication changes in detail. Lately she has been exercising at the gym, most recently  yesterday. She felt well without any anginal symptoms. She notes some swelling in her right ankle since a prior injury in 2014. A long time ago she was on Jardiance, but she does not recall why it was stopped. She denies any palpitations, shortness of breath, lightheadedness, headaches, syncope, orthopnea, or PND.  ROS:  Please see the history of present illness. All other systems are reviewed and negative.  (+) Intermittent right ankle swelling  Studies Reviewed: Marland Kitchen       EKG 11/13/22: Sinus rhythm.  Rate 78 bpm. LAD.  Nonspecific T wave abnormality.  Echo  12/20/2022:  1. Left ventricular ejection fraction, by estimation, is 30 to 35%. The  left ventricle has moderately decreased function. The left ventricle  demonstrates global hypokinesis. Left ventricular diastolic parameters are  consistent with Grade I diastolic  dysfunction (impaired relaxation). Elevated left ventricular end-diastolic  pressure.   2. Right ventricular systolic function is normal. The right ventricular  size is moderately enlarged.   3. The mitral valve is degenerative. Mild mitral valve regurgitation. No  evidence of mitral stenosis.   4. The aortic valve is tricuspid. Aortic valve regurgitation is trivial.  Aortic valve sclerosis/calcification is present, without any evidence of  aortic stenosis.   5. The inferior vena cava is normal in size with greater than 50%  respiratory variability, suggesting right atrial pressure of 3 mmHg.   Lexiscan Myoview  11/30/2022:   LV perfusion is normal. There is no evidence of ischemia. There is no evidence of infarction. Apical thinning artifact.   Left  ventricular function is abnormal. Nuclear stress EF: 47%. The left ventricular ejection fraction is mildly decreased (45-54%). End diastolic cavity size is mildly enlarged.   The study is normal. The study is low risk. LVEF normal for this modality.  CT Chest  07/23/2022: IMPRESSION: 1. Lung-RADS 2, benign appearance or  behavior. Continue annual screening with low-dose chest CT without contrast in 12 months. 2. Dilated main pulmonary artery, suggesting pulmonary arterial hypertension. 3. Three-vessel coronary atherosclerosis. 4. Aberrant right subclavian artery. 5. Bilateral adrenal adenomas, not substantially changed from 05/08/2017 CT, for which no follow-up imaging is recommended. 6. Aortic Atherosclerosis (ICD10-I70.0) and Emphysema (ICD10-J43.9).  Risk Assessment/Calculations:        Physical Exam:    VS:  BP (!) 148/76 (BP Location: Left Arm, Patient Position: Sitting, Cuff Size: Normal)   Pulse 72   Ht 5\' 4"  (1.626 m)   Wt 175 lb 11.2 oz (79.7 kg)   SpO2 94%   BMI 30.16 kg/m  , BMI Body mass index is 30.16 kg/m. GENERAL:  Well appearing HEENT: Pupils equal round and reactive, fundi not visualized, oral mucosa unremarkable NECK:  No jugular venous distention, waveform within normal limits, carotid upstroke brisk and symmetric, no bruits, no thyromegaly LUNGS:  Clear to auscultation bilaterally HEART:  RRR.  PMI not displaced or sustained,S1 and S2 within normal limits, no S3, no S4, no clicks, no rubs, no murmurs ABD:  Flat, positive bowel sounds normal in frequency in pitch, no bruits, no rebound, no guarding, no midline pulsatile mass, no hepatomegaly, no splenomegaly EXT:  2 plus pulses throughout, no edema, no cyanosis no clubbing SKIN:  No rashes no nodules NEURO:  Cranial nerves II through XII grossly intact, motor grossly intact throughout PSYCH:  Cognitively intact, oriented to person place and time  Wt Readings from Last 3 Encounters:  12/26/22 175 lb 11.2 oz (79.7 kg)  11/30/22 174 lb (78.9 kg)  11/13/22 174 lb (78.9 kg)     ASSESSMENT AND PLAN: .    # HFrEF:  Echocardiogram showed reduced ejection fraction (30-35%). No ischemia on stress test. No current symptoms of fluid retention or shortness of breath. Discussed the importance of blood pressure control and fluid  management. -Stop Amlodipine and Losartan. -Start Entresto and Spironolactone. -Obtain a home scale and monitor weight daily. -Call if weight increases by 2 pounds in a day or 5 pounds in a week. -Recheck echocardiogram in 3 months.  # Hypertension Goal is to consistently maintain blood pressure under 130/80. -Adjust medications as above. -Monitor blood pressure at home.  # Diabetes A1c was 7.9 at last check. Discussed the benefits of Farxiga for both diabetes and heart failure. -Start Farxiga.  # Follow-up -Check labs in 1 week to assess kidney function, liver function, and electrolytes. -Follow-up appointment in 1 month.        Dispo:  FU with APP in 1 month. FU with Dusti Tetro C. Duke Salvia, MD, [facility] in 4 months.  I,Mathew Stumpf,acting as a Neurosurgeon for Chilton Si, MD.,have documented all relevant documentation on the behalf of Chilton Si, MD,as directed by  Chilton Si, MD while in the presence of Chilton Si, MD.  I, Eman Morimoto C. Duke Salvia, MD have reviewed all documentation for this visit.  The documentation of the exam, diagnosis, procedures, and orders on 12/31/2022 are all accurate and complete.  Signed, Chilton Si, MD

## 2022-12-26 NOTE — Patient Instructions (Addendum)
Medication Instructions:  STOP LOSARTAN   STOP AMLODIPINE   START ENTRESTO TWICE A DAY   START FARXIGA 10 MG DAILY   Labwork: BMET IN 1 WEEK   Testing/Procedures: Your physician has requested that you have an echocardiogram. Echocardiography is a painless test that uses sound waves to create images of your heart. It provides your doctor with information about the size and shape of your heart and how well your heart's chambers and valves are working. This procedure takes approximately one hour. There are no restrictions for this procedure. Please do NOT wear cologne, perfume, aftershave, or lotions (deodorant is allowed). Please arrive 15 minutes prior to your appointment time.  IN 4 MONTHS ABOUT A WEEK PRIOR TO FOLLOW UP   Follow-Up: 4-8 WEEKS WITH CAITLIN W NP   4 MONTHS WITH DR [facility]   Any Other Special Instructions Will Be Listed Below (If Applicable).  GET SCALES AND WEIGH YOURSELF DAILY FIRST THING IN THE MORNING AFTER YOU EMPTY YOUR BLADDER. CALL THE OFFICE FOR WEIGHT GAIN OF 2 POUNDS IN 24 HOURS OR 5 POUNDS IN A WEEK.   If you need a refill on your cardiac medications before your next appointment, please call your pharmacy.

## 2022-12-27 ENCOUNTER — Encounter (HOSPITAL_BASED_OUTPATIENT_CLINIC_OR_DEPARTMENT_OTHER): Payer: Self-pay

## 2022-12-31 ENCOUNTER — Encounter (HOSPITAL_BASED_OUTPATIENT_CLINIC_OR_DEPARTMENT_OTHER): Payer: Self-pay | Admitting: Cardiovascular Disease

## 2022-12-31 DIAGNOSIS — I5020 Unspecified systolic (congestive) heart failure: Secondary | ICD-10-CM

## 2022-12-31 HISTORY — DX: Unspecified systolic (congestive) heart failure: I50.20

## 2023-01-01 ENCOUNTER — Ambulatory Visit: Payer: 59

## 2023-01-01 DIAGNOSIS — Z0000 Encounter for general adult medical examination without abnormal findings: Secondary | ICD-10-CM

## 2023-01-01 NOTE — Progress Notes (Unsigned)
Appointment Outcome:  Completed, Session #: 1                        Start time: 3:00pm   End time: 3:24pm   Total Mins: 24 minutes  AGREEMENTS SECTION   Overall Goal(s): Smoking cessation                           Agreement/Action Steps:  Purchase 21mg  nicotine patches from pharmacy Wear patches as prescribed Purchase nicotine lozenges from pharmacy Use lozenges when cravings are intense instead of smoking  (e.g., after waking up, after drinking coffee, after eating, before bed) Engage in hobbies to keep hands busy (e.g., arts and crafts) Remove self from around others when they are smoking  Utilize family as support system    Progress Notes:  Patient reported that she was able to purchase the nicotine patches but not the lozenges. Patient shared that she has not started the use of the patch because she is nervous due to starting new hypertension medication and wants to be settled first. Patient shared that she was able to smoke less than normal because she has been busy with preparing for and attending the homecoming weekend. Patient stated that she had to finish the signs for the event prior to the event.   Patient shared that she had a family member to pass and during the time that she was around her family for 10 hours, she did not smoke. Patient shared that currently the longest duration of time she is going without smoking is 5 hours because she is working at the [employer]. Patient reported that on average, when at home, she smokes about 10 cigarettes if she is not busy. Patient stated that today, she has smoked 3 cigarettes because she had a doctor's appointment, and went to work. Patient expressed that she didn't have time to smoke and she does not take cigarettes with her to work.   Patient expressed that she wants to get started working towards quitting      Indicators of Success and Accountability:  Patient has been able to reduce her smoking some days by engaging in  activities for 5-10 hours.   Readiness: Patient is in the action stage of smoking cessation.   Strengths and Supports: Patient is being supported by her family.   Challenges and Barriers: Patient expressed that she is nervous about wearing the nicotine patch while taking the new hypertension medications.      Coaching Outcomes: Patient will continue to work towards reducing the number of cigarettes she smokes daily by engaging in activities/hobbies to keep her busy to extend the length of time in between smoking.  Patient plans to start wearing the nicotine patch around 11/5 or 11/6 because she is working during the election at [employer].   Patient questioned the safety of using the nicotine patches at this time while taking new medications for hypertension. Informed patient that I would follow up with her provider.   Patient was made aware not to smoke and wear the nicotine patch, and encouraged not to take the patch off to smoke. Educated patient on the patient on how the patch is a source of nicotine while she works on resolving the desire to pick up a cigarette to smoke.       Attempted: Fulfilled - Patient engaged in activities/hobbies to keep her hands busy over the past two weeks and utilized her  support system to extend time between between smoking.   Partial - Patient purchased the nicotine patches but have not used them.   Not met - Patient did not purchase the nicotine lozenges to use when cravings were intense instead of smoking. Patient has not removed herself from around others who were smoking.    Note: Sent message to Dr. Duke Salvia for follow up with patient regarding her concern about potential interactions between her hypertension medications and the nicotine patch because she is nervous to wear them since she just started taking the medicine.

## 2023-01-07 NOTE — Addendum Note (Signed)
Addended by: Marlene Lard on: 01/07/2023 02:47 PM   Modules accepted: Orders

## 2023-01-16 ENCOUNTER — Telehealth: Payer: Self-pay | Admitting: Cardiovascular Disease

## 2023-01-16 ENCOUNTER — Telehealth (HOSPITAL_BASED_OUTPATIENT_CLINIC_OR_DEPARTMENT_OTHER): Payer: Self-pay

## 2023-01-16 MED ORDER — ROSUVASTATIN CALCIUM 10 MG PO TABS: 10.0000 mg | ORAL_TABLET | Freq: Every day | ORAL | 11 refills | Status: DC

## 2023-01-16 NOTE — Telephone Encounter (Signed)
Returned call to patient. Alerted that her crestor was refilled and patient stated she had enough, only wanted to know if she needed to continue taking medication. Refill request has been cancelled per phone call to pharmacy.

## 2023-01-16 NOTE — Telephone Encounter (Signed)
Returned call to patient, no answer at this time.

## 2023-01-16 NOTE — Telephone Encounter (Signed)
Patient is returning call and is requesting call back.  

## 2023-01-16 NOTE — Telephone Encounter (Signed)
Pt c/o medication issue:  1. Name of Medication:  rosuvastatin (CRESTOR) 10 MG tablet  2. How are you currently taking this medication (dosage and times per day)?   Take 10 mg by mouth daily.    3. Are you having a reaction (difficulty breathing--STAT)? no  4. What is your medication issue? Pt wants to know if she is continue taking this medications. Pl would like a call back,.

## 2023-01-16 NOTE — Telephone Encounter (Signed)
 Medication has been refilled and sent to preferred pharmacy.

## 2023-01-22 ENCOUNTER — Ambulatory Visit: Payer: 59

## 2023-02-12 ENCOUNTER — Telehealth: Payer: Self-pay

## 2023-02-12 DIAGNOSIS — Z0000 Encounter for general adult medical examination without abnormal findings: Secondary | ICD-10-CM

## 2023-02-12 NOTE — Telephone Encounter (Signed)
Called patient to reschedule health coaching session from 11/12 due to being out of the office. Patient did not answer and voicemail is full. Sent patient a MyChart message to follow up regarding rescheduling appointment and provided contact information.     Renaee Munda, MS, ERHD, [facility]  Care Guide, Health & Wellness Coach [address], [address] [place] [address] Telephone: [phone] Email: [email or web address]

## 2023-03-21 ENCOUNTER — Ambulatory Visit (HOSPITAL_BASED_OUTPATIENT_CLINIC_OR_DEPARTMENT_OTHER): Payer: 59 | Admitting: Family

## 2023-03-21 ENCOUNTER — Encounter (HOSPITAL_BASED_OUTPATIENT_CLINIC_OR_DEPARTMENT_OTHER): Payer: Self-pay | Admitting: Family

## 2023-03-21 VITALS — BP 132/78 | HR 73 | Ht 64.5 in | Wt 170.6 lb

## 2023-03-21 DIAGNOSIS — I10 Essential (primary) hypertension: Secondary | ICD-10-CM | POA: Diagnosis not present

## 2023-03-21 DIAGNOSIS — E785 Hyperlipidemia, unspecified: Secondary | ICD-10-CM

## 2023-03-21 DIAGNOSIS — I5022 Chronic systolic (congestive) heart failure: Secondary | ICD-10-CM | POA: Diagnosis not present

## 2023-03-21 DIAGNOSIS — I25118 Atherosclerotic heart disease of native coronary artery with other forms of angina pectoris: Secondary | ICD-10-CM | POA: Diagnosis not present

## 2023-03-21 NOTE — Patient Instructions (Signed)
 Medication Instructions:  No changes *If you need a refill on your cardiac medications before your next appointment, please call your pharmacy*  Lab Work: Today we are drawing Non fasting blood work If you have labs (blood work) drawn today and your tests are completely normal, you will receive your results only by: MyChart Message (if you have MyChart) OR A paper copy in the mail If you have any lab test that is abnormal or we need to change your treatment, we will call you to review the results.  Testing/Procedures: No testing  Follow-Up: At [facility], you and your health needs are our priority.  As part of our continuing mission to provide you with exceptional heart care, we have created designated Provider Care Teams.  These Care Teams include your primary Cardiologist (physician) and Advanced Practice Providers (APPs -  Physician Assistants and Nurse Practitioners) who all work together to provide you with the care you need, when you need it.  We recommend signing up for the patient portal called MyChart.  Sign up information is provided on this After Visit Summary.  MyChart is used to connect with patients for Virtual Visits (Telemedicine).  Patients are able to view lab/test results, encounter notes, upcoming appointments, etc.  Non-urgent messages can be sent to your provider as well.   To learn more about what you can do with MyChart, go to [email or web address].    Your next appointment:   February 26th at 11:20 am with [name], MD     Other Instructions   Our echocardiogram in October shows your heart muscle function was 30-35% The medicines we use to strengthen your heart muscle function are: Atenolol , Entresto , Dapagliflozin  (Farxiga ), Spironolactone   Recommend using a heat pack for back and arm pain. You could also use ibuprofen  as needed or an over the counter lidocaine  patch.

## 2023-03-21 NOTE — Progress Notes (Signed)
 Cardiology Office Note:  .   Date:  03/22/2023  ID:  Leah Norton, DOB 30-Oct-1955, MRN [id number] PCP: Rik Glinda DASEN, FNP  [facility] Providers Cardiologist:  [name], MD    History of Present Illness: .   Leah Norton is a 68 y.o. female with history of CAD s/p LAD PCI, HFrEF (30-35%) , Dm2, HTN, tobacco use. Prior patient of Dr. Claudene having since re-established with Dr. Scarce.   Prior LHC 2007 nonobstructive disease.  Nuclear stress 2017 abnormal with subsequent LHC with 70-80% proximal LAD stenosis, 50% mid LAD stenosis, 70% RI, 65% LCx disease.  Underwent successful PCI of proximal LAD.  Had a transition from Brilinta  to Plavix  due to dyspnea.  Echo 01/2018 LVEF 45 to 50%, diffuse hypokinesis, moderate hypokinesis of the inferior myocardium, grade 2 diastolic dysfunction.  Seen 11/2021 with intermittent chest pain with a stress test LVEF 47%, no ischemia. Echo with LVEF 30-35%, gr1dd, RV normal. Amlodipine , Losartan  stopped and Entresto , Spironolactone  initiated.  Last seen by Dr. Scarce 12/26/2022.  She had intermittently stopped her Entresto .  She was recommended to start Entresto , spironolactone  and again recommended to stop amlodipine , losartan .Farxiga  was also initiated.   Presents today for follow up. She notes stomach and arm and back discomfort since starting her new medications. These all occur separately. No aggravating factors often occurring while sitting still. She on occasion takes lemon juice which helps. Endorses BP at home 135-140. Reports only dyspnea with more than usual activity. No chest pain, pressure, tightness. Notes her ankles are always swollen but no worse than usual. She has not needed her Lasix . For exercise she goes to the gym twice per week.    Reports the stomach is just painful. She notes her bowel movements are not normal but this is unchanged. Her upper back has been huring but she cannot describe. She did use a heat pack  last night help. Arm pain is predominantly under her left arm. Notes she can feel the difference between her right and left arm. On exam she has a probably small lipoma under her left arm. Notes prior workup completed was unremarkable. We reviewed that these are all atypical side effects of her current medications.   ROS: Please see the history of present illness.    All other systems reviewed and are negative.   Studies Reviewed: .        Cardiac Studies & Procedures   CARDIAC CATHETERIZATION  CARDIAC CATHETERIZATION 02/29/2016  Narrative  Coronary artery disease with eccentric 70-80% proximal LAD stenosis. Noncritical 65% mid circumflex, 50% first diagonal, and 70% ramus intermedius.  Abnormal intermediate risk myocardial perfusion study with anteroapical perfusion abnormality car laced with the proximal LAD stenosis.  Successful stenting of the proximal LAD from 75% to 0% with TIMI grade 3 flow using a 16 x 3.5 Promus Premier post dilated to 3.75 mm in diameter.  Normal LV function with EF 60%. Mild elevation and end-diastolic pressure at 19 mmHg.  RECOMMENDATIONS:   Increase the intensity of statin therapy.  Aspirin  and Brilinta  for at least 6 months.  Risk factor modification.  Anticipate discharge in a.m. if no groin complications.  Findings Coronary Findings Diagnostic  Dominance: Right  Left Anterior Descending The lesion was previously treated.  First Diagonal Branch Vessel is moderate in size.  Second Diagonal Branch Vessel is small in size.  Third Diagonal Branch Vessel is small in size.  Ramus Intermedius Vessel is small.  Left Circumflex  Intervention  Prox LAD lesion Angioplasty Lesion length: 8 mm. Lesion crossed with guidewire using a WIRE ASAHI PROWATER 180CM. Pre-stent angioplasty was performed using a BALLOON ANGIOSCULPT RX 3.0X10. Maximum pressure: 9 atm. Inflation time: 90 sec. A STENT PROMUS PREM MR 3.5X16 drug eluting stent was  successfully placed. Stent strut is well apposed. Post-stent angioplasty was performed using a BALLOON [place] EUPHORA RX3.75X8. Maximum pressure: 14 atm. The pre-interventional distal flow is normal (TIMI 3).  The post-interventional distal flow is normal (TIMI 3). The intervention was successful . No complications occurred at this lesion. Pressure wire/FFR was not performed on the lesion . IVUS was not performed on the lesion . There is a 0% residual stenosis post intervention.   STRESS TESTS  MYOCARDIAL PERFUSION IMAGING 11/30/2022  Narrative   LV perfusion is normal. There is no evidence of ischemia. There is no evidence of infarction. Apical thinning artifact.   Left ventricular function is abnormal. Nuclear stress EF: 47%. The left ventricular ejection fraction is mildly decreased (45-54%). End diastolic cavity size is mildly enlarged.   The study is normal. The study is low risk. LVEF normal for this modality.  ECHOCARDIOGRAM  ECHOCARDIOGRAM COMPLETE 12/20/2022  Narrative ECHOCARDIOGRAM REPORT    Patient Name:   Leah Norton Date of Exam: 12/20/2022 Medical Rec #:  [id number]         Height:       65.0 in Accession #:    [id number]        Weight:       174.0 lb Date of Birth:  Oct 24, 1955         BSA:          1.864 m Patient Age:    34 years          BP:           140/70 mmHg Patient Gender: F                 HR:           56 bpm. Exam Location:  Outpatient  Procedure: 2D Echo, 3D Echo, Cardiac Doppler, Color Doppler and Strain Analysis  Indications:    Chest Pain  History:        Patient has prior history of Echocardiogram examinations, most recent 01/16/2018. CAD, Stroke, Signs/Symptoms:Murmur; Risk Factors:Current Smoker, Hypertension and Diabetes.  Sonographer:    Orvil Holmes RDCS Referring Phys: [id number] TIFFANY   IMPRESSIONS   1. Left ventricular ejection fraction, by estimation, is 30 to 35%. The left ventricle has moderately decreased function. The  left ventricle demonstrates global hypokinesis. Left ventricular diastolic parameters are consistent with Grade I diastolic dysfunction (impaired relaxation). Elevated left ventricular end-diastolic pressure. 2. Right ventricular systolic function is normal. The right ventricular size is moderately enlarged. 3. The mitral valve is degenerative. Mild mitral valve regurgitation. No evidence of mitral stenosis. 4. The aortic valve is tricuspid. Aortic valve regurgitation is trivial. Aortic valve sclerosis/calcification is present, without any evidence of aortic stenosis. 5. The inferior vena cava is normal in size with greater than 50% respiratory variability, suggesting right atrial pressure of 3 mmHg.  FINDINGS Left Ventricle: Left ventricular ejection fraction, by estimation, is 30 to 35%. The left ventricle has moderately decreased function. The left ventricle demonstrates global hypokinesis. 3D ejection fraction reviewed and evaluated as part of the interpretation. Alternate measurement of EF is felt to be most reflective of LV function. The left ventricular internal cavity size was normal in size. There is no left  ventricular hypertrophy. Left ventricular diastolic parameters are consistent with Grade I diastolic dysfunction (impaired relaxation). Elevated left ventricular end-diastolic pressure.  Right Ventricle: The right ventricular size is moderately enlarged. No increase in right ventricular wall thickness. Right ventricular systolic function is normal.  Left Atrium: Left atrial size was normal in size.  Right Atrium: Right atrial size was normal in size.  Pericardium: Trivial pericardial effusion is present. The pericardial effusion is circumferential.  Mitral Valve: The mitral valve is degenerative in appearance. There is mild thickening of the mitral valve leaflet(s). There is mild calcification of the mitral valve leaflet(s). Mild to moderate mitral annular calcification. Mild mitral  valve regurgitation. No evidence of mitral valve stenosis.  Tricuspid Valve: The tricuspid valve is normal in structure. Tricuspid valve regurgitation is trivial. No evidence of tricuspid stenosis.  Aortic Valve: The aortic valve is tricuspid. Aortic valve regurgitation is trivial. Aortic valve sclerosis/calcification is present, without any evidence of aortic stenosis.  Pulmonic Valve: The pulmonic valve was normal in structure. Pulmonic valve regurgitation is mild. No evidence of pulmonic stenosis.  Aorta: The aortic root is normal in size and structure.  Venous: The inferior vena cava is normal in size with greater than 50% respiratory variability, suggesting right atrial pressure of 3 mmHg.  IAS/Shunts: No atrial level shunt detected by color flow Doppler.   LEFT VENTRICLE PLAX 2D LVIDd:         5.25 cm      Diastology LVIDs:         4.48 cm      LV e' medial:    2.61 cm/s LV PW:         1.13 cm      LV E/e' medial:  19.9 LV IVS:        1.08 cm      LV e' lateral:   3.81 cm/s LVOT diam:     1.90 cm      LV E/e' lateral: 13.6 LV SV:         45 LV SV Index:   24 LVOT Area:     2.84 cm  3D Volume EF: LV Volumes (MOD)            3D EF:        48 % LV vol d, MOD A2C: 140.0 ml LV EDV:       157 ml LV vol d, MOD A4C: 173.0 ml LV ESV:       81 ml LV vol s, MOD A2C: 83.7 ml  LV SV:        76 ml LV vol s, MOD A4C: 85.0 ml LV SV MOD A2C:     56.3 ml LV SV MOD A4C:     173.0 ml LV SV MOD BP:      71.5 ml  RIGHT VENTRICLE RV Basal diam:  4.93 cm RV Mid diam:    3.79 cm RV S prime:     9.36 cm/s TAPSE (M-mode): 1.8 cm  LEFT ATRIUM             Index        RIGHT ATRIUM           Index LA diam:        5.20 cm 2.79 cm/m   RA Area:     15.30 cm LA Vol (A2C):   41.8 ml 22.42 ml/m  RA Volume:   38.60 ml  20.70 ml/m LA Vol (A4C):   58.4 ml 31.32 ml/m  LA Biplane Vol: 50.1 ml 26.87 ml/m AORTIC VALVE LVOT Vmax:   77.60 cm/s LVOT Vmean:  48.700 cm/s LVOT VTI:    0.160  m  AORTA Ao Root diam: 2.70 cm Ao Asc diam:  3.50 cm  MITRAL VALVE MV Area (PHT): 3.65 cm     SHUNTS MV Decel Time: 208 msec     Systemic VTI:  0.16 m MR Peak grad: 53.9 mmHg     Systemic Diam: 1.90 cm MR Vmax:      367.00 cm/s MV E velocity: 51.90 cm/s MV A velocity: 117.00 cm/s MV E/A ratio:  0.44  Wilbert Bihari MD Electronically signed by Wilbert Bihari MD Signature Date/Time: 12/20/2022/10:00:34 PM    Final             Risk Assessment/Calculations:             Physical Exam:   VS:  BP 132/78   Pulse 73   Ht 5' 4.5 (1.638 m)   Wt 170 lb 9.6 oz (77.4 kg)   SpO2 95%   BMI 28.83 kg/m    Wt Readings from Last 3 Encounters:  03/21/23 170 lb 9.6 oz (77.4 kg)  12/26/22 175 lb 11.2 oz (79.7 kg)  11/30/22 174 lb (78.9 kg)    GEN: Well nourished, well developed in no acute distress NECK: No JVD; No carotid bruits CARDIAC: RRR, no murmurs, rubs, gallops RESPIRATORY:  Clear to auscultation without rales, wheezing or rhonchi  ABDOMEN: Soft, non-tender, non-distended EXTREMITIES:  No edema; No deformity   ASSESSMENT AND PLAN: .    HFrEF - Euvolemic and well compensated on exam. She reports back pain, arm pain, and stomach discomfort since last clinic visit. We reviewed that these are atypical side effects of Entresto  and Farxiga . We reviewed treatment of HF in detail. She is agreeable to continue current medications.  Labs today CMP, CBC, magnesium .  GDMT includes Entresto , Spironolactone , Farxiga , Atenolol , PRN Lasix .  Repeat echo scheduled 05/02/23 to reassess LVEF after 3 mos of GDMT. Low sodium diet, fluid restriction <2L, and daily weights encouraged. Educated to contact our office for weight gain of 2 lbs overnight or 5 lbs in one week.   CAD / HLD, LDL goal <70 - Stable with no anginal symptoms. No indication for ischemic evaluation.  GDMT plavix , atenolol , farxiga , rosuvastatin . Recommend aiming for 150 minutes of moderate intensity activity per week and following a  heart healthy diet.    HTN - BP not at goal <130/80 in clinic. However, given BP is close to goal <130/80 with clinic reading 132/78 and her hesitation regarding medications, will continue current regimen. Discussed to monitor BP at home at least 2 hours after medications and sitting for 5-10 minutes.   DM2 - Continue to follow with PCP.   Tobacco use - Smoking cessation encouraged. Recommend utilization of [id number].        Dispo: follow up in 6 weeks  Signed, Reche GORMAN Finder, NP

## 2023-03-22 LAB — COMPREHENSIVE METABOLIC PANEL
ALT: 12 [IU]/L (ref 0–32)
AST: 16 [IU]/L (ref 0–40)
Albumin: 4.5 g/dL (ref 3.9–4.9)
Alkaline Phosphatase: 83 [IU]/L (ref 44–121)
BUN/Creatinine Ratio: 14 (ref 12–28)
BUN: 12 mg/dL (ref 8–27)
Bilirubin Total: <0.2 mg/dL (ref 0.0–1.2)
CO2: 23 mmol/L (ref 20–29)
Calcium: 10.9 mg/dL — ABNORMAL HIGH (ref 8.7–10.3)
Chloride: 105 mmol/L (ref 96–106)
Creatinine, Ser: 0.85 mg/dL (ref 0.57–1.00)
Globulin, Total: 3.3 g/dL (ref 1.5–4.5)
Glucose: 109 mg/dL — ABNORMAL HIGH (ref 70–99)
Potassium: 4.8 mmol/L (ref 3.5–5.2)
Sodium: 143 mmol/L (ref 134–144)
Total Protein: 7.8 g/dL (ref 6.0–8.5)
eGFR: 75 mL/min/{1.73_m2} (ref >59)

## 2023-03-22 LAB — CBC
Hematocrit: 39.7 % (ref 34.0–46.6)
Hemoglobin: 12.3 g/dL (ref 11.1–15.9)
MCH: 27.8 pg (ref 26.6–33.0)
MCHC: 31 g/dL — ABNORMAL LOW (ref 31.5–35.7)
MCV: 90 fL (ref 79–97)
Platelets: 411 10*3/uL (ref 150–450)
RBC: 4.42 x10E6/uL (ref 3.77–5.28)
RDW: 14 % (ref 11.7–15.4)
WBC: 7.2 10*3/uL (ref 3.4–10.8)

## 2023-03-22 LAB — MAGNESIUM: Magnesium: 2 mg/dL (ref 1.6–2.3)

## 2023-03-25 ENCOUNTER — Encounter (HOSPITAL_BASED_OUTPATIENT_CLINIC_OR_DEPARTMENT_OTHER): Payer: Self-pay

## 2023-03-25 ENCOUNTER — Telehealth (HOSPITAL_BASED_OUTPATIENT_CLINIC_OR_DEPARTMENT_OTHER): Payer: Self-pay

## 2023-03-25 NOTE — Telephone Encounter (Addendum)
 Called patient regarding results. Patient had understanding of results. Letter mailed to patient regarding results. Per patient request.----- Message from Reche GORMAN Finder sent at 03/25/2023  1:06 PM EST ----- CBC with no evidence of anemia nor infection.  Stable kidney function. Normal potassium, magnesium . Calcium  mildly elevated likely due to chlorthalidone . As this is very mild, not of concern at this time. Overall a good result!

## 2023-05-02 ENCOUNTER — Encounter (HOSPITAL_BASED_OUTPATIENT_CLINIC_OR_DEPARTMENT_OTHER): Payer: 59 | Admitting: Cardiovascular Disease

## 2023-05-02 ENCOUNTER — Other Ambulatory Visit (HOSPITAL_BASED_OUTPATIENT_CLINIC_OR_DEPARTMENT_OTHER): Payer: 59

## 2023-05-03 ENCOUNTER — Ambulatory Visit (INDEPENDENT_AMBULATORY_CARE_PROVIDER_SITE_OTHER): Payer: 59

## 2023-05-03 DIAGNOSIS — I5020 Unspecified systolic (congestive) heart failure: Secondary | ICD-10-CM

## 2023-05-03 DIAGNOSIS — R9430 Abnormal result of cardiovascular function study, unspecified: Secondary | ICD-10-CM

## 2023-05-07 LAB — ECHOCARDIOGRAM COMPLETE
AR max vel: 1.6 cm2
AV Area VTI: 1.54 cm2
AV Area mean vel: 1.5 cm2
AV Mean grad: 5 mm[Hg]
AV Peak grad: 10.9 mm[Hg]
Ao pk vel: 1.65 m/s
Area-P 1/2: 2.52 cm2
Calc EF: 53.9 %
S' Lateral: 3.95 cm
Single Plane A2C EF: 55.4 %
Single Plane A4C EF: 51.7 %

## 2023-05-08 ENCOUNTER — Ambulatory Visit (HOSPITAL_BASED_OUTPATIENT_CLINIC_OR_DEPARTMENT_OTHER): Payer: 59 | Admitting: Cardiovascular Disease

## 2023-05-13 ENCOUNTER — Other Ambulatory Visit: Payer: Self-pay | Admitting: Family

## 2023-05-13 ENCOUNTER — Ambulatory Visit
Admission: RE | Admit: 2023-05-13 | Discharge: 2023-05-13 | Disposition: A | Discharge status: Home / Self Care | Source: Ambulatory Visit | Attending: Family | Admitting: Family

## 2023-05-13 DIAGNOSIS — Z0000 Encounter for general adult medical examination without abnormal findings: Secondary | ICD-10-CM

## 2023-06-05 ENCOUNTER — Encounter: Payer: Self-pay | Admitting: Family

## 2023-06-05 DIAGNOSIS — Z1231 Encounter for screening mammogram for malignant neoplasm of breast: Secondary | ICD-10-CM

## 2023-06-12 ENCOUNTER — Other Ambulatory Visit: Payer: Self-pay | Admitting: Family

## 2023-06-12 DIAGNOSIS — R5381 Other malaise: Secondary | ICD-10-CM

## 2023-06-12 DIAGNOSIS — F17210 Nicotine dependence, cigarettes, uncomplicated: Secondary | ICD-10-CM

## 2023-06-21 ENCOUNTER — Telehealth (HOSPITAL_BASED_OUTPATIENT_CLINIC_OR_DEPARTMENT_OTHER): Payer: Self-pay | Admitting: *Deleted

## 2023-06-21 DIAGNOSIS — I5022 Chronic systolic (congestive) heart failure: Secondary | ICD-10-CM

## 2023-06-21 NOTE — Telephone Encounter (Signed)
 Left message to call back

## 2023-06-21 NOTE — Telephone Encounter (Signed)
-----   Message from [facility] sent at 06/21/2023  4:56 PM EDT ----- Echo shoed that her heart pumping function has improved from 30-35% 12/2022 to 45-50%/  This is good news.  It did show that there may be a congenital cause of hear heart failure and they recommended getting a cardiac MRI to better assess.  Leah Norton can you please help arranage?

## 2023-06-24 ENCOUNTER — Encounter (HOSPITAL_BASED_OUTPATIENT_CLINIC_OR_DEPARTMENT_OTHER): Payer: Self-pay

## 2023-06-26 ENCOUNTER — Telehealth (HOSPITAL_BASED_OUTPATIENT_CLINIC_OR_DEPARTMENT_OTHER): Payer: Self-pay | Admitting: Cardiovascular Disease

## 2023-06-26 DIAGNOSIS — Z01812 Encounter for preprocedural laboratory examination: Secondary | ICD-10-CM

## 2023-06-26 DIAGNOSIS — I5022 Chronic systolic (congestive) heart failure: Secondary | ICD-10-CM

## 2023-06-26 NOTE — Telephone Encounter (Signed)
 Follow Up:       Patient says she would like her Echo results from February. She said she never received them.

## 2023-06-26 NOTE — Telephone Encounter (Signed)
 Pt is returning call.

## 2023-06-26 NOTE — Addendum Note (Signed)
 Addended by: Zorita Hiss on: 06/26/2023 03:30 PM   Modules accepted: Orders

## 2023-06-26 NOTE — Telephone Encounter (Signed)
 Called and left voice message to call back

## 2023-06-26 NOTE — Telephone Encounter (Signed)
 Called and spoke to pt. Pt aware of echo results. Pt aware of cardiac MRI needed for better assessment. Will pend cardiac MRI order and follow up accordingly. Pt verbalized understanding and was thankful for call.

## 2023-06-29 ENCOUNTER — Other Ambulatory Visit (HOSPITAL_BASED_OUTPATIENT_CLINIC_OR_DEPARTMENT_OTHER): Payer: Self-pay | Admitting: Cardiovascular Disease
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
## 2023-07-24 ENCOUNTER — Ambulatory Visit: Attending: Student | Admitting: Student

## 2023-07-24 ENCOUNTER — Other Ambulatory Visit: Payer: Self-pay | Admitting: Student

## 2023-07-24 ENCOUNTER — Other Ambulatory Visit: Payer: Self-pay | Admitting: Family

## 2023-07-24 ENCOUNTER — Encounter: Payer: Self-pay | Admitting: Student

## 2023-07-24 VITALS — BP 120/62 | HR 72 | Ht 64.5 in | Wt 172.4 lb

## 2023-07-24 DIAGNOSIS — I5022 Chronic systolic (congestive) heart failure: Secondary | ICD-10-CM | POA: Diagnosis not present

## 2023-07-24 DIAGNOSIS — E785 Hyperlipidemia, unspecified: Secondary | ICD-10-CM

## 2023-07-24 DIAGNOSIS — Z720 Tobacco use: Secondary | ICD-10-CM

## 2023-07-24 DIAGNOSIS — R5381 Other malaise: Secondary | ICD-10-CM

## 2023-07-24 DIAGNOSIS — I10 Essential (primary) hypertension: Secondary | ICD-10-CM

## 2023-07-24 DIAGNOSIS — I2510 Atherosclerotic heart disease of native coronary artery without angina pectoris: Secondary | ICD-10-CM | POA: Diagnosis not present

## 2023-07-24 DIAGNOSIS — E118 Type 2 diabetes mellitus with unspecified complications: Secondary | ICD-10-CM

## 2023-07-24 DIAGNOSIS — Z780 Asymptomatic menopausal state: Secondary | ICD-10-CM

## 2023-07-24 MED ORDER — ATENOLOL 50 MG PO TABS: 50.0000 mg | ORAL_TABLET | Freq: Every day | ORAL | 3 refills | Status: AC

## 2023-07-24 MED ORDER — CLOPIDOGREL BISULFATE 75 MG PO TABS: 75.0000 mg | ORAL_TABLET | Freq: Every day | ORAL | 3 refills | Status: AC

## 2023-07-24 MED ORDER — ROSUVASTATIN CALCIUM 10 MG PO TABS: 10.0000 mg | ORAL_TABLET | Freq: Every day | ORAL | 3 refills | Status: AC

## 2023-07-24 MED ORDER — CHLORTHALIDONE 25 MG PO TABS: 25.0000 mg | ORAL_TABLET | Freq: Every morning | ORAL | 3 refills | Status: DC

## 2023-07-24 MED ORDER — SACUBITRIL-VALSARTAN 97-103 MG PO TABS: 1.0000 | ORAL_TABLET | Freq: Two times a day (BID) | ORAL | 3 refills | Status: AC

## 2023-07-24 MED ORDER — SPIRONOLACTONE 25 MG PO TABS: 25.0000 mg | ORAL_TABLET | Freq: Every day | ORAL | 3 refills | Status: AC

## 2023-07-24 MED ORDER — FUROSEMIDE 20 MG PO TABS
20.0000 mg | ORAL_TABLET | Freq: Every day | ORAL | 1 refills | Status: AC | PRN
Start: 2023-07-24 — End: ?

## 2023-07-24 MED ORDER — DAPAGLIFLOZIN PROPANEDIOL 10 MG PO TABS: 10.0000 mg | ORAL_TABLET | Freq: Every day | ORAL | 3 refills | Status: AC

## 2023-07-24 NOTE — Patient Instructions (Signed)
 Medication Instructions:  Your physician recommends that you continue on your current medications as directed. Please refer to the Current Medication list given to you today.  *If you need a refill on your cardiac medications before your next appointment, please call your pharmacy*   Your next appointment:   4 month(s)  Provider:   Maudine Sos, MD or Neomi Banks, NP

## 2023-07-25 NOTE — Addendum Note (Signed)
 Addended by: Jeneen Mire on: 07/25/2023 08:47 AM   Modules accepted: Orders

## 2023-07-29 ENCOUNTER — Ambulatory Visit
Admission: RE | Admit: 2023-07-29 | Discharge: 2023-07-29 | Disposition: A | Discharge status: Home / Self Care | Source: Ambulatory Visit | Attending: Family | Admitting: Family

## 2023-07-29 DIAGNOSIS — R5381 Other malaise: Secondary | ICD-10-CM

## 2023-07-29 DIAGNOSIS — Z780 Asymptomatic menopausal state: Secondary | ICD-10-CM

## 2023-09-12 ENCOUNTER — Ambulatory Visit (HOSPITAL_COMMUNITY)

## 2023-11-08 ENCOUNTER — Encounter (HOSPITAL_COMMUNITY): Payer: Self-pay

## 2023-11-12 ENCOUNTER — Encounter (HOSPITAL_COMMUNITY): Payer: Self-pay

## 2023-11-12 ENCOUNTER — Ambulatory Visit (HOSPITAL_COMMUNITY): Admission: RE | Admit: 2023-11-12 | Source: Ambulatory Visit

## 2023-11-12 ENCOUNTER — Telehealth: Payer: Self-pay | Admitting: Cardiovascular Disease

## 2023-11-12 NOTE — Telephone Encounter (Signed)
 Noted that pt did show up for her cMRI today at 4 pm.  Our office will call her with those results once the Provider reviews and interprets.  We will mail the results accordingly thereafter, per pt request.    Will close this encounter for now and refer to it as needed.

## 2023-11-12 NOTE — Telephone Encounter (Signed)
 Pt has a MRI toda at 4pm , she called in asking why does she needs MRI and if it can be done at another facility. Please advise.

## 2023-11-12 NOTE — Telephone Encounter (Signed)
 Returned a call back to the pt.   Pt was inquiring about her cMRI appt that is scheduled for today, and if this is still indicated for her to have done.   Pt education provided as to why this test was ordered and what the cMRI will look at.    Informed the pt that [facility] is the only facility in which we do this type test, and she would need to report there to have this done today at a little before 4pm.  Pt request that once her results come back from her cMRI, she would like them mailed to her.  Pt aware that once we get the results back and the MD has reviewed them, we will give her a call with the results and then place the hard copy in the mail to her thereafter.   Pt verbalized understanding of this, but still had additional questions she was asking, but phone was disconnected after that.   Tried calling her back 3 times to speak more on cMRI questions, and was unsuccessful in connecting with her, so I left her a message to call back with any additional questions/concerns and to proceed on with scheduled cMRI for today at 4 pm.

## 2023-11-13 NOTE — Telephone Encounter (Signed)
 Patient is calling to speak with the nurse about the MRI. Please advise

## 2023-11-13 NOTE — Telephone Encounter (Signed)
 Did go for cardiac MRI but was told would be in closed machine for over an hour  She requested that something be called in for her to take prior to MRI  Does have someone who can drive her  Advised patient Dr Raford out of the office this week, she asked if I would consult with Reche ORN NP  Will forward to Caitlin for review

## 2023-11-19 MED ORDER — DIAZEPAM 5 MG PO TABS: ORAL_TABLET | ORAL | Status: DC

## 2023-11-19 NOTE — Telephone Encounter (Signed)
 Left message to call back.

## 2023-11-19 NOTE — Addendum Note (Signed)
 Addended by: Pressley Barsky S on: 11/19/2023 04:53 PM   Modules accepted: Orders

## 2023-11-19 NOTE — Telephone Encounter (Signed)
 Verbal Rx for Valium  5mg  one hour prior to cardiac MRI (one tablet with zero refills) called into [facility] on [address]. [place] DEA database reviewed, no recent Rx for controlled substance.    Arhan Mcmanamon S Camil Wilhelmsen, NP

## 2023-11-20 NOTE — Telephone Encounter (Signed)
 Spoke with patient and advised  Will send message to MRI scheduling team to call and get scheduled

## 2023-11-20 NOTE — Telephone Encounter (Signed)
 Patient returned RN's call.

## 2023-11-21 ENCOUNTER — Telehealth (HOSPITAL_BASED_OUTPATIENT_CLINIC_OR_DEPARTMENT_OTHER): Payer: Self-pay | Admitting: Family

## 2023-11-25 ENCOUNTER — Ambulatory Visit (HOSPITAL_BASED_OUTPATIENT_CLINIC_OR_DEPARTMENT_OTHER): Admitting: Family

## 2023-12-03 NOTE — Progress Notes (Shared)
 [facility] - Clinic Note  12/17/2023   CHIEF COMPLAINT Patient presents for No chief complaint on file.  HISTORY OF PRESENT ILLNESS: Leah Norton is a 68 y.o. female who presents to the clinic today for:   Pt is here on the referral of Deneda Daye, FNP for DM exam, pt would also like a referral for ptosis, pts last A1c was 7.9 on 07.25.23, pt states she has been diabetic for 5-10 years, she is on janumet , she states her blood sugar runs around 130   Referring physician: Rik Glinda DASEN, FNP [address] [place],  [place] [address]  HISTORICAL INFORMATION:  Selected notes from the MEDICAL RECORD NUMBER Referred by Glinda Rik, FNP for DM exam LEE:  Ocular Hx- PMH-   CURRENT MEDICATIONS: No current outpatient medications on file. (Ophthalmic Drugs)   No current facility-administered medications for this visit. (Ophthalmic Drugs)   Current Outpatient Medications (Other)  Medication Sig   atenolol  (TENORMIN ) 50 MG tablet Take 1 tablet (50 mg total) by mouth daily.   chlorthalidone  (HYGROTON ) 25 MG tablet Take 1 tablet (25 mg total) by mouth every morning.   Cholecalciferol  (VITAMIN D3) 1000 units CAPS Take 2,000 Units by mouth daily.   clopidogrel  (PLAVIX ) 75 MG tablet Take 1 tablet (75 mg total) by mouth daily.   COD LIVER OIL PO Take by mouth.   cyclobenzaprine  (FLEXERIL ) 5 MG tablet Take 1 tablet (5 mg total) by mouth at bedtime.   dapagliflozin  propanediol (FARXIGA ) 10 MG TABS tablet Take 1 tablet (10 mg total) by mouth daily.   diazepam  (VALIUM ) 5 MG tablet Take one hour prior to cardiac MRI.   Docosahexaenoic Acid (ALGAL OMEGA-3 DHA) 200 MG CAPS Take by mouth.   furosemide  (LASIX ) 20 MG tablet Take 1 tablet (20 mg total) by mouth daily as needed (weight gain (3lbs in 1 day or 5lbs in 1 week)).   Hyoscyamine Sulfate SL 0.125 MG SUBL 0.125 mg every 4 (four) hours as needed (cramping).   ibuprofen  (ADVIL ) 600 MG tablet Take 1 tablet (600 mg total)  by mouth every 6 (six) hours as needed.   Lancets (ONETOUCH DELICA PLUS LANCET33G) MISC daily. for testing blood sugar   loratadine (CLARITIN) 10 MG tablet Take 10 mg by mouth daily.   MIRALAX  17 GM/SCOOP powder    Multiple Vitamin (MULTIVITAMIN) tablet Take 1 tablet by mouth daily. OTC   nicotine  (NICODERM CQ ) 7 mg/24hr patch Place 1 patch (7 mg total) onto the skin daily. FOR 2 WEEKS AFTER YOU FINISH THE 14 MG RX   nitroGLYCERIN  (NITROSTAT ) 0.4 MG SL tablet Place 1 tablet (0.4 mg total) under the tongue every 5 (five) minutes as needed for chest pain.   ONETOUCH VERIO test strip daily. for testing blood sugar   pantoprazole  (PROTONIX ) 40 MG tablet Take 1 tablet (40 mg total) by mouth daily.   rosuvastatin  (CRESTOR ) 10 MG tablet Take 1 tablet (10 mg total) by mouth daily.   sacubitril -valsartan  (ENTRESTO ) 97-103 MG Take 1 tablet by mouth 2 (two) times daily.   sitaGLIPtin -metformin  (JANUMET ) 50-1000 MG per tablet Take 1 tablet by mouth 2 (two) times daily with a meal.   SODIUM FLUORIDE 5000 PLUS 1.1 % CREA dental cream at bedtime.   spironolactone  (ALDACTONE ) 25 MG tablet Take 1 tablet (25 mg total) by mouth daily.   tiZANidine  (ZANAFLEX ) 4 MG tablet Take 1 tablet (4 mg total) by mouth every 6 (six) hours as needed for muscle spasms.  Zinc Sulfate (ZINC 15 PO) Take by mouth.   No current facility-administered medications for this visit. (Other)   REVIEW OF SYSTEMS:   ALLERGIES Allergies  Allergen Reactions   Codeine Rash   Erythromycin Rash   Penicillins Rash    Has patient had a PCN reaction causing immediate rash, facial/tongue/throat swelling, SOB or lightheadedness with hypotension: Yes  Has patient had a PCN reaction causing severe rash involving mucus membranes or skin necrosis: No  Has patient had a PCN reaction that required hospitalization: No  Has patient had a PCN reaction occurring within the last 10 years: Yes  If all of the above answers are NO, then may proceed  with Cephalosporin use.  Has patient had a PCN reaction causing immediate rash, facial/tongue/throat swelling, SOB or lightheadedness with hypotension: Yes, Has patient had a PCN reaction causing severe rash involving mucus membranes or skin necrosis: No, Has patient had a PCN reaction that required hospitalization: No, Has patient had a PCN reaction occurring within the last 10 years: Yes, If all of the above answers are NO, then may proceed with Cephalosporin use.   Sulfa Antibiotics Rash   Sulfonamide Derivatives Rash   Vicodin [Hydrocodone -Acetaminophen ] Rash    Allergy to HYDROCODONE . Not tylenol     PAST MEDICAL HISTORY Past Medical History:  Diagnosis Date   Anginal pain    Arthritis    right anikle (02/29/2016)   CAD in native artery 02/08/2016   Three-vessel coronary artery calcification on CT scan   Complication of anesthesia    had reaction w/hives, rash, itching (02/29/2016)   Constipation    Coronary artery disease    12/17 PCI with DESx1 to pLAD EF normal   Enlarged heart    GERD (gastroesophageal reflux disease)    HFrEF (heart failure with reduced ejection fraction) (HCC) 12/31/2022   High cholesterol    Hypertension    Migraine    hx   Stroke (HCC)    Symptomatic anemia 07/10/2016   Type II diabetes mellitus (HCC)    Past Surgical History:  Procedure Laterality Date   ABDOMINAL HYSTERECTOMY     CARDIAC CATHETERIZATION N/A 02/29/2016   Procedure: Left Heart Cath and Coronary Angiography;  Surgeon: Victory LELON Sharps, MD;  Location: [facility] INVASIVE CV LAB;  Service: Cardiovascular;  Laterality: N/A;   CARDIAC CATHETERIZATION N/A 02/29/2016   Procedure: Coronary Stent Intervention;  Surgeon: Victory LELON Sharps, MD;  Location: [facility] INVASIVE CV LAB;  Service: Cardiovascular;  Laterality: N/A;   CARDIAC CATHETERIZATION  2005   no blockages   COLONOSCOPY WITH PROPOFOL  N/A 03/15/2014   Procedure: COLONOSCOPY WITH PROPOFOL ;  Surgeon: Gladis MARLA Louder, MD;  Location: [facility]  ENDOSCOPY;  Service: Endoscopy;  Laterality: N/A;   CORONARY ANGIOPLASTY WITH STENT PLACEMENT     ESOPHAGOGASTRODUODENOSCOPY N/A 07/11/2016   Procedure: ESOPHAGOGASTRODUODENOSCOPY (EGD);  Surgeon: Lynwood Bohr, MD;  Location: [facility] ENDOSCOPY;  Service: Endoscopy;  Laterality: N/A;   FRACTURE SURGERY     MULTIPLE TOOTH EXTRACTIONS     teeth removed   ORIF ANKLE FRACTURE Right 11/12/2012   Procedure: OPEN REDUCTION INTERNAL FIXATION (ORIF) ANKLE FRACTURE;  Surgeon: Kay Ozell Cummins, MD;  Location: [facility] OR;  Service: Orthopedics;  Laterality: Right;  Open reduction internal fixation ankle fracture, trimalleolar   FAMILY HISTORY Family History  Problem Relation Age of Onset   Hypertension Mother    SOCIAL HISTORY Social History   Tobacco Use   Smoking status: Some Days    Current packs/day: 0.00    Average  packs/day: 0.5 packs/day for 42.0 years (21.0 ttl pk-yrs)    Types: Cigarettes    Start date: 01/16/1976    Last attempt to quit: 01/15/2018    Years since quitting: 5.8   Smokeless tobacco: Never  Vaping Use   Vaping status: Never Used  Substance Use Topics   Alcohol use: Yes    Comment: occasionally   Drug use: No       OPHTHALMIC EXAM:  Not recorded    IMAGING AND PROCEDURES  Imaging and Procedures for 12/17/2023         ASSESSMENT/PLAN: No diagnosis found.  1,2. Diabetes mellitus, type 2 without retinopathy  - last A1c 7.9 on 07.05.23 - The incidence, risk factors for progression, natural history and treatment options for diabetic retinopathy  were discussed with patient.   - The need for close monitoring of blood glucose, blood pressure, and serum lipids, avoiding cigarette or any type of tobacco, and the need for long term follow up was also discussed with patient. - f/u in 1 year, sooner prn  3,4. Hypertensive retinopathy OU - discussed importance of tight BP control - monitor  5. Mixed Cataract OU - The symptoms of cataract, surgical options, and treatments  and risks were discussed with patient. - discussed diagnosis and progression - likely visually signfiicant - will refer to Dr. Fleeta for cataract evaluation and management  6,7. Ptosis OS, dermatochalasis OU - will refer to Oculoplastics for further evaluation and management  Ophthalmic Meds Ordered this visit:  No orders of the defined types were placed in this encounter.    No follow-ups on file.  There are no Patient Instructions on file for this visit.  Explained the diagnoses, plan, and follow up with the patient and they expressed understanding.  Patient expressed understanding of the importance of proper follow up care.   This document serves as a record of services personally performed by Redell JUDITHANN Hans, MD, PhD. It was created on their behalf by Wanda GEANNIE Keens, COT an ophthalmic technician. The creation of this record is the provider's dictation and/or activities during the visit.    Electronically signed by:  Wanda GEANNIE Keens, COT  12/03/23 8:34 AM     Redell JUDITHANN Hans, M.D., Ph.D. Diseases & Surgery of the Retina and Vitreous [facility]    Abbreviations: M myopia (nearsighted); A astigmatism; H hyperopia (farsighted); P presbyopia; Mrx spectacle prescription;  CTL contact lenses; OD right eye; OS left eye; OU both eyes  XT exotropia; ET esotropia; PEK punctate epithelial keratitis; PEE punctate epithelial erosions; DES dry eye syndrome; MGD meibomian gland dysfunction; ATs artificial tears; PFAT's preservative free artificial tears; NSC nuclear sclerotic cataract; PSC posterior subcapsular cataract; ERM epi-retinal membrane; PVD posterior vitreous detachment; RD retinal detachment; DM diabetes mellitus; DR diabetic retinopathy; NPDR non-proliferative diabetic retinopathy; PDR proliferative diabetic retinopathy; CSME clinically significant macular edema; DME diabetic macular edema; dbh dot blot hemorrhages; CWS cotton wool spot; POAG primary  open angle glaucoma; C/D cup-to-disc ratio; HVF humphrey visual field; GVF goldmann visual field; OCT optical coherence tomography; IOP intraocular pressure; BRVO Branch retinal vein occlusion; CRVO central retinal vein occlusion; CRAO central retinal artery occlusion; BRAO branch retinal artery occlusion; RT retinal tear; SB scleral buckle; PPV pars plana vitrectomy; VH Vitreous hemorrhage; PRP panretinal laser photocoagulation; IVK intravitreal kenalog ; VMT vitreomacular traction; MH Macular hole;  NVD neovascularization of the disc; NVE neovascularization elsewhere; AREDS age related eye disease study; ARMD age related macular degeneration; POAG primary open  angle glaucoma; EBMD epithelial/anterior basement membrane dystrophy; ACIOL anterior chamber intraocular lens; IOL intraocular lens; PCIOL posterior chamber intraocular lens; Phaco/IOL phacoemulsification with intraocular lens placement; PRK photorefractive keratectomy; LASIK laser assisted in [place] keratomileusis; HTN hypertension; DM diabetes mellitus; COPD chronic obstructive pulmonary disease

## 2023-12-04 NOTE — Telephone Encounter (Signed)
 Good Morning,    Just wanted to make you aware I have attempted to contact patient 3 more times she pt has sent message regarding CMRI. I have been able to leave a message but have not got a call back to schedule.  Patient has been contacted a total of 6 times to schedule CMRI.    Katelyn    Will forward to Dr Raford as RICK

## 2023-12-17 ENCOUNTER — Encounter (INDEPENDENT_AMBULATORY_CARE_PROVIDER_SITE_OTHER): Payer: 59 | Admitting: Ophthalmology

## 2023-12-17 DIAGNOSIS — H02402 Unspecified ptosis of left eyelid: Secondary | ICD-10-CM

## 2023-12-17 DIAGNOSIS — H25813 Combined forms of age-related cataract, bilateral: Secondary | ICD-10-CM

## 2023-12-17 DIAGNOSIS — H02831 Dermatochalasis of right upper eyelid: Secondary | ICD-10-CM

## 2023-12-17 DIAGNOSIS — H35033 Hypertensive retinopathy, bilateral: Secondary | ICD-10-CM

## 2023-12-17 DIAGNOSIS — I10 Essential (primary) hypertension: Secondary | ICD-10-CM

## 2023-12-17 DIAGNOSIS — E119 Type 2 diabetes mellitus without complications: Secondary | ICD-10-CM

## 2023-12-17 DIAGNOSIS — Z7984 Long term (current) use of oral hypoglycemic drugs: Secondary | ICD-10-CM

## 2024-01-21 ENCOUNTER — Ambulatory Visit (INDEPENDENT_AMBULATORY_CARE_PROVIDER_SITE_OTHER): Admitting: Family

## 2024-01-21 ENCOUNTER — Encounter (HOSPITAL_BASED_OUTPATIENT_CLINIC_OR_DEPARTMENT_OTHER): Payer: Self-pay | Admitting: Family

## 2024-01-21 VITALS — BP 130/78 | HR 69 | Ht 65.5 in | Wt 168.9 lb

## 2024-01-21 DIAGNOSIS — Z5181 Encounter for therapeutic drug level monitoring: Secondary | ICD-10-CM

## 2024-01-21 DIAGNOSIS — I5022 Chronic systolic (congestive) heart failure: Secondary | ICD-10-CM

## 2024-01-21 DIAGNOSIS — I10 Essential (primary) hypertension: Secondary | ICD-10-CM

## 2024-01-21 DIAGNOSIS — I2510 Atherosclerotic heart disease of native coronary artery without angina pectoris: Secondary | ICD-10-CM

## 2024-01-21 DIAGNOSIS — I5020 Unspecified systolic (congestive) heart failure: Secondary | ICD-10-CM | POA: Diagnosis not present
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
## 2024-01-21 NOTE — Patient Instructions (Signed)
 Medication Instructions:   STOP taking chlorthalidone   STOP taking amlodipine    *If you need a refill on your cardiac medications before your next appointment, please call your pharmacy*   Lab Work:  TODAY--3rd floor [address]--BMET AND CBC  If you have labs (blood work) drawn today and your tests are completely normal, you will receive your results only by: MyChart Message (if you have MyChart) OR A paper copy in the mail If you have any lab test that is abnormal or we need to change your treatment, we will call you to review the results.   Testing/Procedures:  PLEASE GET YOUR CARDIAC MRI [facility] OUR IMAGING NURSE BLIMA CREDIT WALLACE AT [phone]    You are scheduled for Cardiac MRI at the location below.  Please arrive for your appointment at ______________ . ?  [facility] [address] [place], [place] [address] Please take advantage of the free valet parking available at the [facility] and [employer] (Entrance C).  Proceed to the [facility] Radiology Department (First Floor) for check-in.     Magnetic resonance imaging (MRI) is a painless test that produces images of the inside of the body without using Xrays.  During an MRI, strong magnets and radio waves work together in a data processing manager to form detailed images.   MRI images may provide more details about a medical condition than X-rays, CT scans, and ultrasounds can provide.  You may be given earphones to listen for instructions.  You may eat a light breakfast and take medications as ordered with the exception of furosemide , hydrochlorothiazide , chlorthalidone  or spironolactone  (or any other fluid pill). If you are undergoing a stress MRI, please avoid stimulants for 12 hr prior to test. (I.e. Caffeine, nicotine , chocolate, or antihistamine medications)  If your provider has ordered anti-anxiety medications for this test, then you will need a driver.  An IV will be inserted into  one of your veins. Contrast material will be injected into your IV. It will leave your body through your urine within a day. You may be told to drink plenty of fluids to help flush the contrast material out of your system.  You will be asked to remove all metal, including: Watch, jewelry, and other metal objects including hearing aids, hair pieces and dentures. Also wearable glucose monitoring systems (ie. Freestyle Libre and Omnipods) (Braces and fillings normally are not a problem.)   TEST WILL TAKE APPROXIMATELY 1 HOUR  PLEASE NOTIFY SCHEDULING AT LEAST 24 HOURS IN ADVANCE IF YOU ARE UNABLE TO KEEP YOUR APPOINTMENT. [phone]  For more information and frequently asked questions, please visit our website : [email or web address]  Please call the Cardiac Imaging Nurse Navigators with any questions/concerns. [phone] Office    Follow-Up:  IN [place] WITH DR. [place] OR CAITLIN WALKER, NP

## 2024-01-22 ENCOUNTER — Ambulatory Visit (HOSPITAL_BASED_OUTPATIENT_CLINIC_OR_DEPARTMENT_OTHER): Payer: Self-pay | Admitting: Family

## 2024-01-22 LAB — CBC
Hematocrit: 34.7 % (ref 34.0–46.6)
Hemoglobin: 11 g/dL — ABNORMAL LOW (ref 11.1–15.9)
MCH: 28.8 pg (ref 26.6–33.0)
MCHC: 31.7 g/dL (ref 31.5–35.7)
MCV: 91 fL (ref 79–97)
Platelets: 377 x10E3/uL (ref 150–450)
RBC: 3.82 x10E6/uL (ref 3.77–5.28)
RDW: 14.3 % (ref 11.7–15.4)
WBC: 5.5 x10E3/uL (ref 3.4–10.8)

## 2024-01-22 LAB — BASIC METABOLIC PANEL WITH GFR
BUN/Creatinine Ratio: 15 (ref 12–28)
BUN: 14 mg/dL (ref 8–27)
CO2: 23 mmol/L (ref 20–29)
Calcium: 10.2 mg/dL (ref 8.7–10.3)
Chloride: 103 mmol/L (ref 96–106)
Creatinine, Ser: 0.94 mg/dL (ref 0.57–1.00)
Glucose: 102 mg/dL — ABNORMAL HIGH (ref 70–99)
Potassium: 4.6 mmol/L (ref 3.5–5.2)
Sodium: 138 mmol/L (ref 134–144)
eGFR: 66 mL/min/1.73 (ref >59)

## 2024-01-25 ENCOUNTER — Encounter (HOSPITAL_COMMUNITY): Payer: Self-pay

## 2024-01-25 ENCOUNTER — Ambulatory Visit (HOSPITAL_COMMUNITY)
Admission: EM | Admit: 2024-01-25 | Discharge: 2024-01-25 | Disposition: A | Discharge status: Home / Self Care | Attending: Emergency Medicine | Admitting: Emergency Medicine

## 2024-01-25 ENCOUNTER — Ambulatory Visit (INDEPENDENT_AMBULATORY_CARE_PROVIDER_SITE_OTHER)

## 2024-01-25 DIAGNOSIS — J069 Acute upper respiratory infection, unspecified: Secondary | ICD-10-CM

## 2024-01-25 DIAGNOSIS — R051 Acute cough: Secondary | ICD-10-CM

## 2024-01-25 LAB — POC SOFIA SARS ANTIGEN FIA: SARS Coronavirus 2 Ag: NEGATIVE

## 2024-01-25 MED ORDER — BENZONATATE 100 MG PO CAPS: 100.0000 mg | ORAL_CAPSULE | Freq: Three times a day (TID) | ORAL | 0 refills | Status: DC

## 2024-01-25 MED ORDER — PROMETHAZINE-DM 6.25-15 MG/5ML PO SYRP: 5.0000 mL | ORAL_SOLUTION | Freq: Every evening | ORAL | 0 refills | Status: DC | PRN

## 2024-01-25 NOTE — ED Provider Notes (Signed)
 [facility]    CSN: [id number] Arrival date & time: 01/25/24  1619      History   Chief Complaint Chief Complaint  Patient presents with   Cough   Nasal Congestion    HPI Leah Norton is a 68 y.o. female.   Patient presents with congested sounding cough and nasal congestion that began about 4 days ago.  Patient states that her cough is not been productive but sounds very congested.  Patient does endorse some chest pain with coughing.  Denies shortness of breath or constant chest pain.  Denies fever, body aches, chills, nausea, vomiting, and diarrhea.  Patient reports that she has been taking Mucinex and Coricidin with minimal relief.  Patient denies history of asthma or COPD.  The history is provided by the patient and medical records.  Cough   Past Medical History:  Diagnosis Date   Anginal pain    Arthritis    right anikle (02/29/2016)   CAD in native artery 02/08/2016   Three-vessel coronary artery calcification on CT scan   Complication of anesthesia    had reaction w/hives, rash, itching (02/29/2016)   Constipation    Coronary artery disease    12/17 PCI with DESx1 to pLAD EF normal   Enlarged heart    GERD (gastroesophageal reflux disease)    HFrEF (heart failure with reduced ejection fraction) (HCC) 12/31/2022   High cholesterol    Hypertension    Migraine    hx   Stroke (HCC)    Symptomatic anemia 07/10/2016   Type II diabetes mellitus [facility])     Patient Active Problem List   Diagnosis Date Noted   HFrEF (heart failure with reduced ejection fraction) (HCC) 12/31/2022   Mechanical complication associated with orthopedic device 01/14/2019   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   Acute ischemic stroke (HCC) 01/16/2018   Referred otalgia of left ear 12/26/2017   GI bleed 07/10/2016   Morbid obesity due to excess calories (HCC) 05/14/2016   Dyspnea on exertion 04/29/2016   Abnormal nuclear stress test    CAD S/P percutaneous  coronary angioplasty 02/08/2016   INSOMNIA, CHRONIC 02/28/2010   Headache 02/28/2010   HEEL PAIN, RIGHT 11/29/2009   COUGH DUE TO ACE INHIBITORS 11/29/2009   ATRIAL ENLARGEMENT, LEFT 03/21/2009   ANEMIA 12/28/2008   CARDIAC MURMUR 12/28/2008   OTHER NONSPECIFIC ABNORMAL SERUM ENZYME LEVELS 12/28/2008   CERVICAL MUSCLE STRAIN 12/28/2008   CONSTIPATION 06/01/2008   POSTMENOPAUSAL STATUS 05/15/2008   BRONCHITIS, CHRONIC 12/18/2007   Cigarette smoker 08/19/2007   DEGENERATIVE DISC DISEASE, LUMBOSACRAL SPINE 08/19/2007   LOW BACK PAIN SYNDROME 08/19/2007   UPPER RESPIRATORY INFECTION, VIRAL 03/05/2007   NEOPLASM, SKIN, UNCERTAIN BEHAVIOR 01/28/2007   GANGLION CYST, WRIST, LEFT 01/28/2007   DENTAL PAIN 01/19/2007   DM (diabetes mellitus), type 2, uncontrolled with complications 12/10/2006   Hyperlipidemia with target LDL less than 70 12/10/2006   Essential hypertension 12/10/2006   RHINITIS, ALLERGIC NEC 12/10/2006   HIP PAIN, RIGHT 12/10/2006    Past Surgical History:  Procedure Laterality Date   ABDOMINAL HYSTERECTOMY     CARDIAC CATHETERIZATION N/A 02/29/2016   Procedure: Left Heart Cath and Coronary Angiography;  Surgeon: Victory LELON Sharps, MD;  Location: [facility] INVASIVE CV LAB;  Service: Cardiovascular;  Laterality: N/A;   CARDIAC CATHETERIZATION N/A 02/29/2016   Procedure: Coronary Stent Intervention;  Surgeon: Victory LELON Sharps, MD;  Location: [facility] INVASIVE CV LAB;  Service: Cardiovascular;  Laterality: N/A;   CARDIAC CATHETERIZATION  2005  no blockages   COLONOSCOPY WITH PROPOFOL  N/A 03/15/2014   Procedure: COLONOSCOPY WITH PROPOFOL ;  Surgeon: Gladis MARLA Louder, MD;  Location: [facility] ENDOSCOPY;  Service: Endoscopy;  Laterality: N/A;   CORONARY ANGIOPLASTY WITH STENT PLACEMENT     ESOPHAGOGASTRODUODENOSCOPY N/A 07/11/2016   Procedure: ESOPHAGOGASTRODUODENOSCOPY (EGD);  Surgeon: Lynwood Bohr, MD;  Location: [facility] ENDOSCOPY;  Service: Endoscopy;  Laterality: N/A;   FRACTURE SURGERY     MULTIPLE  TOOTH EXTRACTIONS     teeth removed   ORIF ANKLE FRACTURE Right 11/12/2012   Procedure: OPEN REDUCTION INTERNAL FIXATION (ORIF) ANKLE FRACTURE;  Surgeon: Kay Ozell Cummins, MD;  Location: [facility] OR;  Service: Orthopedics;  Laterality: Right;  Open reduction internal fixation ankle fracture, trimalleolar    OB History   No obstetric history on file.      Home Medications    Prior to Admission medications   Medication Sig Start Date End Date Taking? Authorizing Provider  benzonatate  (TESSALON ) 100 MG capsule Take 1 capsule (100 mg total) by mouth every 8 (eight) hours. 01/25/24  Yes Johnie, Kathleene Bergemann A, NP  promethazine -dextromethorphan  (PROMETHAZINE -DM) 6.25-15 MG/5ML syrup Take 5 mLs by mouth at bedtime as needed for cough. 01/25/24  Yes Johnie, Tatelyn Vanhecke A, NP  atenolol  (TENORMIN ) 50 MG tablet Take 1 tablet (50 mg total) by mouth daily. 07/24/23 01/21/24  Goodrich, Callie E, PA-C  Cholecalciferol  (VITAMIN D3) 1000 units CAPS Take 2,000 Units by mouth daily.    [provider]  clopidogrel  (PLAVIX ) 75 MG tablet Take 1 tablet (75 mg total) by mouth daily. 07/24/23   Goodrich, Callie E, PA-C  COD LIVER OIL PO Take by mouth. 12/26/17   [provider]  cyclobenzaprine  (FLEXERIL ) 5 MG tablet Take 1 tablet (5 mg total) by mouth at bedtime. 05/28/18   Whitfield Raisin, NP  dapagliflozin  propanediol (FARXIGA ) 10 MG TABS tablet Take 1 tablet (10 mg total) by mouth daily. 07/24/23   Goodrich, Callie E, PA-C  diazepam  (VALIUM ) 5 MG tablet Take one hour prior to cardiac MRI. Patient not taking: Reported on 01/25/2024 11/19/23   Vannie Reche RAMAN, NP  Docosahexaenoic Acid (ALGAL OMEGA-3 DHA) 200 MG CAPS Take by mouth. 03/01/22   [provider]  furosemide  (LASIX ) 20 MG tablet Take 1 tablet (20 mg total) by mouth daily as needed (weight gain (3lbs in 1 day or 5lbs in 1 week)). 07/24/23   Goodrich, Callie E, PA-C  Hyoscyamine Sulfate SL 0.125 MG SUBL 0.125 mg every 4 (four) hours as needed  (cramping). 03/01/22   [provider]  ibuprofen  (ADVIL ) 600 MG tablet Take 1 tablet (600 mg total) by mouth every 6 (six) hours as needed. 02/05/22   Rising, Asberry, PA-C  Lancets (ONETOUCH DELICA PLUS [place]) MISC daily. for testing blood sugar 05/10/22   [provider]  loratadine (CLARITIN) 10 MG tablet Take 10 mg by mouth daily. 06/15/22   [provider]  MIRALAX  17 GM/SCOOP powder  06/15/22   [provider]  Multiple Vitamin (MULTIVITAMIN) tablet Take 1 tablet by mouth daily. OTC    [provider]  nicotine  (NICODERM CQ ) 7 mg/24hr patch Place 1 patch (7 mg total) onto the skin daily. FOR 2 WEEKS AFTER YOU FINISH THE 14 MG RX 01/08/23   Raford Riggs, MD  nitroGLYCERIN  (NITROSTAT ) 0.4 MG SL tablet Place 1 tablet (0.4 mg total) under the tongue every 5 (five) minutes as needed for chest pain. 09/07/20   Claudene Victory ORN, MD  [facility] VERIO test strip daily. for testing  blood sugar 05/11/22   [provider]  pantoprazole  (PROTONIX ) 40 MG tablet Take 1 tablet (40 mg total) by mouth daily. 11/17/20   Claudene Victory ORN, MD  rosuvastatin  (CRESTOR ) 10 MG tablet Take 1 tablet (10 mg total) by mouth daily. 07/24/23   Goodrich, Callie E, PA-C  sacubitril -valsartan  (ENTRESTO ) 97-103 MG Take 1 tablet by mouth 2 (two) times daily. 07/24/23   Goodrich, Callie E, PA-C  sitaGLIPtin -metformin  (JANUMET ) 50-1000 MG per tablet Take 1 tablet by mouth 2 (two) times daily with a meal.    [provider]  SODIUM FLUORIDE 5000 PLUS 1.1 % CREA dental cream at bedtime. 06/07/22   [provider]  spironolactone  (ALDACTONE ) 25 MG tablet Take 1 tablet (25 mg total) by mouth daily. 07/24/23   Goodrich, Callie E, PA-C  tiZANidine  (ZANAFLEX ) 4 MG tablet Take 1 tablet (4 mg total) by mouth every 6 (six) hours as needed for muscle spasms. 06/17/22   Lynwood Lenis, PA-C  Zinc Sulfate (ZINC 15 PO) Take by mouth.    [provider]    Family History Family  History  Problem Relation Age of Onset   Hypertension Mother     Social History Social History   Tobacco Use   Smoking status: Some Days    Current packs/day: 0.00    Average packs/day: 0.5 packs/day for 42.0 years (21.0 ttl pk-yrs)    Types: Cigarettes    Start date: 01/16/1976    Last attempt to quit: 01/15/2018    Years since quitting: 6.0    Passive exposure: Current   Smokeless tobacco: Never  Vaping Use   Vaping status: Never Used  Substance Use Topics   Alcohol use: Yes    Comment: occasionally   Drug use: No     Allergies   Codeine, Erythromycin, Penicillins, Sulfa antibiotics, Sulfonamide derivatives, and Vicodin [hydrocodone -acetaminophen ]   Review of Systems Review of Systems  Respiratory:  Positive for cough.    Per HPI  Physical Exam Triage Vital Signs ED Triage Vitals [01/25/24 1725]  Encounter Vitals Group     BP 134/64     Girls Systolic BP Percentile      Girls Diastolic BP Percentile      Boys Systolic BP Percentile      Boys Diastolic BP Percentile      Pulse Rate 76     Resp 16     Temp 98.5 F (36.9 C)     Temp Source Oral     SpO2 93 %     Weight      Height      Head Circumference      Peak Flow      Pain Score 0     Pain Loc      Pain Education      Exclude from Growth Chart    No data found.  Updated Vital Signs BP 134/64 (BP Location: Left Arm)   Pulse 76   Temp 98.5 F (36.9 C) (Oral)   Resp 16   SpO2 93%   Visual Acuity Right Eye Distance:   Left Eye Distance:   Bilateral Distance:    Right Eye Near:   Left Eye Near:    Bilateral Near:     Physical Exam Vitals and nursing note reviewed.  Constitutional:      General: She is awake. She is not in acute distress.    Appearance: Normal appearance. She is well-developed and well-groomed. She is not ill-appearing.  HENT:  Right Ear: Tympanic membrane, ear canal and external ear normal.     Left Ear: Tympanic membrane, ear canal and external ear normal.      Nose: Congestion and rhinorrhea present.     Mouth/Throat:     Mouth: Mucous membranes are moist.     Pharynx: Posterior oropharyngeal erythema and postnasal drip present. No oropharyngeal exudate.  Cardiovascular:     Rate and Rhythm: Normal rate and regular rhythm.  Pulmonary:     Effort: Pulmonary effort is normal.     Breath sounds: Normal breath sounds.  Skin:    General: Skin is warm and dry.  Neurological:     Mental Status: She is alert.  Psychiatric:        Behavior: Behavior is cooperative.      UC Treatments / Results  Labs (all labs ordered are listed, but only abnormal results are displayed) Labs Reviewed  POC SOFIA SARS ANTIGEN FIA - Normal    EKG   Radiology DG Chest 2 View Result Date: 01/25/2024 CLINICAL DATA:  Cough and nasal congestion. EXAM: CHEST - 2 VIEW COMPARISON:  04/11/2016. FINDINGS: The heart is enlarged and the mediastinal contour is within normal limits. There is atherosclerotic calcification aorta. No consolidation, effusion, or pneumothorax is seen. No acute osseous abnormality. IMPRESSION: No active cardiopulmonary disease. Electronically Signed   By: Leita Birmingham M.D.   On: 01/25/2024 18:41    Procedures Procedures (including critical care time)  Medications Ordered in UC Medications - No data to display  Initial Impression / Assessment and Plan / UC Course  I have reviewed the triage vital signs and the nursing notes.  Pertinent labs & imaging results that were available during my care of the patient were reviewed by me and considered in my medical decision making (see chart for details).     Patient is overall well-appearing.  Vitals are stable.  SpO2 is on the lower side at 93% on room air.  Patient is currently wearing thick nail polish and wonder if this is contributing to these low SpO2 readings as patient does not appear to be in any acute distress and is not short of breath.  Congestion and rhinorrhea are present, erythema and  PND noted to posterior oropharynx.  Lungs clear bilaterally.  Chest x-ray ordered to rule out underlying pneumonia.  I independently interpreted these images and there is no active cardiopulmonary disease.  Radiology report confirms this.  COVID testing was negative.  Symptoms likely from viral upper respiratory infection.  Prescribed Tessalon  Promethazine  DM as needed for cough.  Discussed over-the-counter medications for cough and congestion.  Discussed follow-up and return precautions. Final Clinical Impressions(s) / UC Diagnoses   Final diagnoses:  Acute cough  Acute upper respiratory infection     Discharge Instructions      Your COVID test was negative today.  Your chest x-ray did not reveal any underlying pneumonia. I believe you have a viral upper respiratory infection and therefore this does not require any antibiotics at this time. You can take Tessalon  every 8 hours as needed for cough. You can take Promethazine  DM cough syrup at bedtime as needed for cough.  This can make you drowsy so do not drive, work, or drink alcohol while taking this. Otherwise you can continue to take Coricidin during the day for cough and congestion. Follow-up with your primary care provider or return here as needed.     ED Prescriptions     Medication Sig Dispense Auth. Provider  benzonatate  (TESSALON ) 100 MG capsule Take 1 capsule (100 mg total) by mouth every 8 (eight) hours. 21 capsule Johnie Flaming A, NP   promethazine -dextromethorphan  (PROMETHAZINE -DM) 6.25-15 MG/5ML syrup Take 5 mLs by mouth at bedtime as needed for cough. 118 mL Johnie Flaming A, NP      PDMP not reviewed this encounter.   Johnie Flaming A, NP 01/25/24 [phone]

## 2024-01-25 NOTE — Discharge Instructions (Addendum)
 Your COVID test was negative today.  Your chest x-ray did not reveal any underlying pneumonia. I believe you have a viral upper respiratory infection and therefore this does not require any antibiotics at this time. You can take Tessalon  every 8 hours as needed for cough. You can take Promethazine  DM cough syrup at bedtime as needed for cough.  This can make you drowsy so do not drive, work, or drink alcohol while taking this. Otherwise you can continue to take Coricidin during the day for cough and congestion. Follow-up with your primary care provider or return here as needed.

## 2024-01-25 NOTE — ED Triage Notes (Signed)
 Patient  c/o nasal congestion and a non productive cough x 4 days.  Patient has been taking Mucinex, Coricidin and honey/lemon for her cough.

## 2024-01-29 ENCOUNTER — Encounter (HOSPITAL_BASED_OUTPATIENT_CLINIC_OR_DEPARTMENT_OTHER): Payer: Self-pay | Admitting: *Deleted

## 2024-02-03 ENCOUNTER — Encounter (HOSPITAL_COMMUNITY): Payer: Self-pay

## 2024-02-05 ENCOUNTER — Other Ambulatory Visit (HOSPITAL_COMMUNITY)

## 2024-02-05 ENCOUNTER — Ambulatory Visit (HOSPITAL_COMMUNITY): Admission: RE | Admit: 2024-02-05 | Source: Ambulatory Visit

## 2024-02-05 NOTE — Telephone Encounter (Signed)
Patient returned RN's call regarding lab results.

## 2024-02-20 NOTE — Telephone Encounter (Signed)
 Pt calling to f/u on results please advise

## 2024-04-10 ENCOUNTER — Encounter (HOSPITAL_BASED_OUTPATIENT_CLINIC_OR_DEPARTMENT_OTHER): Payer: Self-pay | Admitting: Cardiovascular Disease

## 2024-04-10 ENCOUNTER — Ambulatory Visit (INDEPENDENT_AMBULATORY_CARE_PROVIDER_SITE_OTHER): Admitting: Cardiovascular Disease

## 2024-04-10 VITALS — BP 134/78 | HR 72 | Ht 65.5 in | Wt 169.0 lb

## 2024-04-10 DIAGNOSIS — Z9861 Coronary angioplasty status: Secondary | ICD-10-CM | POA: Diagnosis not present

## 2024-04-10 DIAGNOSIS — I2510 Atherosclerotic heart disease of native coronary artery without angina pectoris: Secondary | ICD-10-CM

## 2024-04-10 DIAGNOSIS — E785 Hyperlipidemia, unspecified: Secondary | ICD-10-CM | POA: Diagnosis not present

## 2024-04-10 DIAGNOSIS — I10 Essential (primary) hypertension: Secondary | ICD-10-CM | POA: Diagnosis not present

## 2024-04-10 DIAGNOSIS — I5020 Unspecified systolic (congestive) heart failure: Secondary | ICD-10-CM | POA: Diagnosis not present

## 2024-04-10 NOTE — Patient Instructions (Addendum)
 Medication Instructions:  Your physician recommends that you continue on your current medications as directed. Please refer to the Current Medication list given to you today.   *If you need a refill on your cardiac medications before your next appointment, please call your pharmacy*  Lab Work: LIPID/CMET TODAY  If you have labs (blood work) drawn today and your tests are completely normal, you will receive your results only by: MyChart Message (if you have MyChart) OR A paper copy in the mail If you have any lab test that is abnormal or we need to change your treatment, we will call you to review the results.  Testing/Procedures: Your echo last year showed that your heart pumping function has improved to 45-50%.  This is a big step in the right direction.  Normal would be >55% and it has been as low as 30-35% in the past.  Given that you have metal in your ankle, we will not get a cardiac MRI.  We will repeat your echo in one year.  In the meantime, keep doing what you are doing by exercising regularly and watching your diet. REPEAT ECHO IN 1 YEAR ABOUT A WEEK PRIOR TO FOLLOW UP WITH DR Springer OR APP   Follow-Up: At [facility], you and your health needs are our priority.  As part of our continuing mission to provide you with exceptional heart care, our providers are all part of one team.  This team includes your primary Cardiologist (physician) and Advanced Practice Providers or APPs (Physician Assistants and Nurse Practitioners) who all work together to provide you with the care you need, when you need it.  Your next appointment:   12 month(s) ABOUT A WEEK AFTER ECHO   Provider:   [name], MD, [name], NP, or [name], NP    We recommend signing up for the patient portal called MyChart.  Sign up information is provided on this After Visit Summary.  MyChart is used to connect with patients for Virtual Visits (Telemedicine).  Patients are able to view  lab/test results, encounter notes, upcoming appointments, etc.  Non-urgent messages can be sent to your provider as well.   To learn more about what you can do with MyChart, go to [email or web address].   Other Instructions

## 2024-04-10 NOTE — Addendum Note (Signed)
 Addended by: FREDIRICK BEAU B on: 04/10/2024 09:15 AM   Modules accepted: Orders

## 2024-04-10 NOTE — Progress Notes (Signed)
 " Cardiology Office Note:  .   Date:  04/10/2024  ID:  Leah Norton, DOB January 11, 1956, MRN [id number] PCP: Rik Glinda DASEN, FNP  [facility] Providers Cardiologist:  [name], MD    History of Present Illness: .    Leah Norton is a 69 y.o. 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  She was previously a patient of Dr. Claudene.  She had a prior cardiac catheterization 2005 that revealed nonobstructive disease.  Nuclear stress test in 2017 revealed LVEF 49% with anterior, septal, and apical infarct with peri-infarct ischemia.  She subsequently underwent left heart cath and was found to have 70-80% proximal LAD stenosis, 50% mid LAD stenosis, 70% ramus intermedius, and 65% left circumflex disease.  She underwent successful PCI of the proximal LAD.  She was initially started on ticagrelor  but transition to clopidogrel  due to dyspnea.  Her most recent echo 01/2018 revealed LVEF 40-45% with diffuse hypokinesis and moderate hypokinesis of the inferior myocardium.  She had grade 2 diastolic dysfunction.  She last saw Dr. Claudene 09/2021 and was feeling well.  She continued to smoke.   At her visit 11/2021 she reported intermittent chest pain.  Nuclear stress revealed LVEF 47% and no ischemia.  Echo revealed LVEF 30-35% with grade 1 diastolic dysfunciton.  RV function was normal.  Amlodipine  and losartan  were discontinued.  She was started on Entresto  and spironolactone  was added.   At her to visit 12/2022 she was feeling well with blood pressure was uncontrolled.  Given her systolic dysfunction amlodipine  was discontinued and she was started on Entresto  and spironolactone .  Losartan  was discontinued as well.  She was also started on Farxiga .  Echo 04/2023 revealed LVEF improvement to 45-50%.  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  Cardiac MRI was ordered but she  did not go for this.  She saw [name], NP 01/2024 and was doing well.  Discussed the use of AI scribe software for clinical note transcription with the patient, who gave verbal consent to proceed.  History of Present Illness [name] has not needed lasix  recently for fluid retention. She feels good overall and engages in regular physical activity, including daily steps and housework, competing with her sisters to maintain her activity levels.  She has a persistent cough since November, which is clear. No sinus issues or drainage. She has received her flu shot.  Her family history is significant for kidney issues, with a sister currently on dialysis and a mother and aunt who were also on dialysis. Her kidney function has been normal in the past.  She monitors her blood pressure at home, which has been stable, with readings around 130-140. Her blood sugar was recently 94. She is on rosuvastatin  for cholesterol management, which has been well controlled. She tries to maintain a healthy diet, avoiding fried foods.  ROS:  As per HPI  Studies Reviewed: .        Echo 04/2023:  1. 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   2. Left ventricular ejection fraction, by estimation, is 45 to 50%. Left  ventricular ejection fraction by 3D volume is 50 %. The left ventricle has  mildly decreased function. The left ventricle demonstrates global  hypokinesis. There is mild left  ventricular hypertrophy. Left ventricular diastolic parameters are  consistent with  Grade I diastolic dysfunction (impaired relaxation). The  global longitudinal strain is indeterminate.   3. Right ventricular systolic function is mildly reduced. The right  ventricular size is normal. Tricuspid regurgitation signal is inadequate  for assessing PA pressure.   4. The mitral valve is degenerative. Mild mitral  valve regurgitation. No  evidence of mitral stenosis.   5. The aortic valve is tricuspid. Aortic valve regurgitation is trivial.  No aortic stenosis is present.   6. There is mild (Grade II) plaque involving the aortic root.   7. The inferior vena cava is normal in size with greater than 50%  respiratory variability, suggesting right atrial pressure of 3 mmHg.   Echo  12/20/2022:  1. Left ventricular ejection fraction, by estimation, is 30 to 35%. The  left ventricle has moderately decreased function. The left ventricle  demonstrates global hypokinesis. Left ventricular diastolic parameters are  consistent with Grade I diastolic  dysfunction (impaired relaxation). Elevated left ventricular end-diastolic  pressure.   2. Right ventricular systolic function is normal. The right ventricular  size is moderately enlarged.   3. The mitral valve is degenerative. Mild mitral valve regurgitation. No  evidence of mitral stenosis.   4. The aortic valve is tricuspid. Aortic valve regurgitation is trivial.  Aortic valve sclerosis/calcification is present, without any evidence of  aortic stenosis.   5. The inferior vena cava is normal in size with greater than 50%  respiratory variability, suggesting right atrial pressure of 3 mmHg.    Lexiscan  Myoview   11/30/2022:   LV perfusion is normal. There is no evidence of ischemia. There is no evidence of infarction. Apical thinning artifact.   Left ventricular function is abnormal. Nuclear stress EF: 47%. The left ventricular ejection fraction is mildly decreased (45-54%). End diastolic cavity size is mildly enlarged.   The study is normal. The study is low risk. LVEF normal for this modality.   CT Chest  07/23/2022: IMPRESSION: 1. Lung-RADS 2, benign appearance or behavior. Continue annual screening with low-dose chest CT without contrast in 12 months. 2. Dilated main pulmonary artery, suggesting pulmonary arterial hypertension. 3. Three-vessel coronary  atherosclerosis. 4. Aberrant right subclavian artery. 5. Bilateral adrenal adenomas, not substantially changed from 05/08/2017 CT, for which no follow-up imaging is recommended. 6. Aortic Atherosclerosis (ICD10-I70.0) and Emphysema (ICD10-J43.9).  Risk Assessment/Calculations:            Physical Exam:   VS:  BP 134/78 (BP Location: Left Arm, Patient Position: Sitting, Cuff Size: Normal)   Pulse 72   Ht 5' 5.5 (1.664 m)   Wt 169 lb (76.7 kg)   SpO2 95%   BMI 27.70 kg/m  , BMI Body mass index is 27.7 kg/m. GENERAL:  Well appearing HEENT: Pupils equal round and reactive, fundi not visualized, oral mucosa unremarkable NECK:  No jugular venous distention, waveform within normal limits, carotid upstroke brisk and symmetric, no bruits, no thyromegaly LUNGS:  Clear to auscultation bilaterally HEART:  RRR.  PMI not displaced or sustained,S1 and S2 within normal limits, no S3, no S4, no clicks, no rubs, II/VI systolic murmur at the LUSB ABD:  Flat, positive bowel sounds normal in frequency in pitch, no bruits, no rebound, no guarding, no midline pulsatile mass, no hepatomegaly, no splenomegaly EXT:  2 plus pulses throughout, no edema, no cyanosis no clubbing SKIN:  No rashes no nodules NEURO:  Cranial nerves II through XII grossly intact, motor grossly intact throughout PSYCH:  Cognitively intact, oriented to person place and time  ASSESSMENT AND PLAN: .    Assessment & Plan # Heart failure with moderately reduced ejection fraction (EF 45-50%) EF improved from 30-35% to 45-50% with current medications. 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 Cardiac MRI not pursued due to metal in ankle and clinical improvement. Current management effective. - Continue atenolol , Entresto , Spironolactone , Farxiga .  Lasix  prn.  - Encouraged regular exercise and healthy diet. - Repeat echocardiogram next year.  # Atherosclerotic coronary artery disease, status post percutaneous coronary intervention with  stent On Plavix  for secondary prevention of myocardial infarction and stroke. - Continue Plavix .  # Essential hypertension Blood pressure reasonably well-controlled with current medications.  BP goal <130/80. - Continue Atenolol , Entresto , Spironolactone . - Monitor blood pressure at home.  # Hyperlipidemia Cholesterol levels well-controlled with rosuvastatin  and diet.  LDL goal <70. - Continue rosuvastatin . - Ordered lipid panel.  # Type 2 diabetes mellitus Blood sugar levels well-controlled with current management. - Continue Farxiga .         Dispo: f/u 1 year  Signed, [name], MD   "

## 2024-04-11 LAB — COMPREHENSIVE METABOLIC PANEL WITH GFR
ALT: 12 [IU]/L (ref 0–32)
AST: 15 [IU]/L (ref 0–40)
Albumin: 4.2 g/dL (ref 3.9–4.9)
Alkaline Phosphatase: 76 [IU]/L (ref 49–135)
BUN/Creatinine Ratio: 18 (ref 12–28)
BUN: 16 mg/dL (ref 8–27)
Bilirubin Total: <0.2 mg/dL (ref 0.0–1.2)
CO2: 23 mmol/L (ref 20–29)
Calcium: 10.3 mg/dL (ref 8.7–10.3)
Chloride: 103 mmol/L (ref 96–106)
Creatinine, Ser: 0.88 mg/dL (ref 0.57–1.00)
Globulin, Total: 3.4 g/dL (ref 1.5–4.5)
Glucose: 89 mg/dL (ref 70–99)
Potassium: 4.6 mmol/L (ref 3.5–5.2)
Sodium: 139 mmol/L (ref 134–144)
Total Protein: 7.6 g/dL (ref 6.0–8.5)
eGFR: 71 mL/min/{1.73_m2}

## 2024-04-11 LAB — LIPID PANEL
Chol/HDL Ratio: 3.9 ratio (ref 0.0–4.4)
Cholesterol, Total: 169 mg/dL (ref 100–199)
HDL: 43 mg/dL
LDL Chol Calc (NIH): 97 mg/dL (ref 0–99)
Triglycerides: 168 mg/dL — ABNORMAL HIGH (ref 0–149)
VLDL Cholesterol Cal: 29 mg/dL (ref 5–40)
